# Patient Record
Sex: Female | Born: 1935
Health system: Southern US, Community
[De-identification: ages and names within clinical notes are randomized; demographics above are authoritative.]

## PROBLEM LIST (undated history)

## (undated) DIAGNOSIS — E039 Hypothyroidism, unspecified: Secondary | ICD-10-CM

## (undated) DIAGNOSIS — M069 Rheumatoid arthritis, unspecified: Secondary | ICD-10-CM

## (undated) DIAGNOSIS — M5136 Other intervertebral disc degeneration, lumbar region: Secondary | ICD-10-CM

## (undated) DIAGNOSIS — G629 Polyneuropathy, unspecified: Secondary | ICD-10-CM

## (undated) DIAGNOSIS — H9121 Sudden idiopathic hearing loss, right ear: Secondary | ICD-10-CM

## (undated) DIAGNOSIS — N950 Postmenopausal bleeding: Secondary | ICD-10-CM

## (undated) DIAGNOSIS — H6121 Impacted cerumen, right ear: Secondary | ICD-10-CM

## (undated) DIAGNOSIS — I5032 Chronic diastolic (congestive) heart failure: Secondary | ICD-10-CM

## (undated) DIAGNOSIS — G250 Essential tremor: Secondary | ICD-10-CM

## (undated) DIAGNOSIS — M810 Age-related osteoporosis without current pathological fracture: Secondary | ICD-10-CM

## (undated) DIAGNOSIS — K529 Noninfective gastroenteritis and colitis, unspecified: Secondary | ICD-10-CM

## (undated) DIAGNOSIS — M1990 Unspecified osteoarthritis, unspecified site: Secondary | ICD-10-CM

## (undated) DIAGNOSIS — I110 Hypertensive heart disease with heart failure: Secondary | ICD-10-CM

## (undated) DIAGNOSIS — M4000 Postural kyphosis, site unspecified: Secondary | ICD-10-CM

## (undated) DIAGNOSIS — N951 Menopausal and female climacteric states: Secondary | ICD-10-CM

## (undated) DIAGNOSIS — I639 Cerebral infarction, unspecified: Secondary | ICD-10-CM

## (undated) DIAGNOSIS — E559 Vitamin D deficiency, unspecified: Secondary | ICD-10-CM

## (undated) DIAGNOSIS — I493 Ventricular premature depolarization: Secondary | ICD-10-CM

## (undated) DIAGNOSIS — R609 Edema, unspecified: Secondary | ICD-10-CM

## (undated) DIAGNOSIS — L602 Onychogryphosis: Secondary | ICD-10-CM

## (undated) DIAGNOSIS — H34213 Partial retinal artery occlusion, bilateral: Secondary | ICD-10-CM

## (undated) DIAGNOSIS — Z8673 Personal history of transient ischemic attack (TIA), and cerebral infarction without residual deficits: Secondary | ICD-10-CM

## (undated) DIAGNOSIS — R55 Syncope and collapse: Secondary | ICD-10-CM

## (undated) DIAGNOSIS — I10 Essential (primary) hypertension: Secondary | ICD-10-CM

## (undated) DIAGNOSIS — N813 Complete uterovaginal prolapse: Secondary | ICD-10-CM

## (undated) DIAGNOSIS — I4510 Unspecified right bundle-branch block: Secondary | ICD-10-CM

## (undated) DIAGNOSIS — G40909 Epilepsy, unspecified, not intractable, without status epilepticus: Secondary | ICD-10-CM

## (undated) DIAGNOSIS — I6789 Other cerebrovascular disease: Secondary | ICD-10-CM

## (undated) DIAGNOSIS — I340 Nonrheumatic mitral (valve) insufficiency: Secondary | ICD-10-CM

## (undated) DIAGNOSIS — R269 Unspecified abnormalities of gait and mobility: Secondary | ICD-10-CM

## (undated) DIAGNOSIS — D649 Anemia, unspecified: Secondary | ICD-10-CM

## (undated) DIAGNOSIS — M51369 Other intervertebral disc degeneration, lumbar region without mention of lumbar back pain or lower extremity pain: Secondary | ICD-10-CM

## (undated) HISTORY — DX: Unspecified abnormalities of gait and mobility: R26.9

## (undated) HISTORY — DX: Age-related osteoporosis without current pathological fracture: M81.0

## (undated) HISTORY — DX: Unspecified right bundle-branch block: I45.10

## (undated) HISTORY — DX: Personal history of transient ischemic attack (TIA), and cerebral infarction without residual deficits: Z86.73

## (undated) HISTORY — DX: Other intervertebral disc degeneration, lumbar region without mention of lumbar back pain or lower extremity pain: M51.369

## (undated) HISTORY — DX: Postural kyphosis, site unspecified: M40.00

## (undated) HISTORY — DX: Unspecified osteoarthritis, unspecified site: M19.90

## (undated) HISTORY — DX: Rheumatoid arthritis, unspecified: M06.9

## (undated) HISTORY — DX: Noninfective gastroenteritis and colitis, unspecified: K52.9

## (undated) HISTORY — DX: Complete uterovaginal prolapse: N81.3

## (undated) HISTORY — DX: Essential tremor: G25.0

## (undated) HISTORY — DX: Hypertensive heart disease with heart failure: I11.0

## (undated) HISTORY — DX: Other intervertebral disc degeneration, lumbar region: M51.36

## (undated) HISTORY — DX: Vitamin D deficiency, unspecified: E55.9

## (undated) HISTORY — DX: Hypothyroidism, unspecified: E03.9

## (undated) HISTORY — DX: Chronic diastolic (congestive) heart failure: I50.32

## (undated) HISTORY — DX: Partial retinal artery occlusion, bilateral: H34.213

## (undated) HISTORY — DX: Epilepsy, unspecified, not intractable, without status epilepticus: G40.909

## (undated) HISTORY — DX: Edema, unspecified: R60.9

## (undated) HISTORY — DX: Cerebral infarction, unspecified: I63.9

## (undated) HISTORY — DX: Other cerebrovascular disease: I67.89

## (undated) HISTORY — DX: Syncope and collapse: R55

## (undated) HISTORY — DX: Polyneuropathy, unspecified: G62.9

## (undated) HISTORY — DX: Menopausal and female climacteric states: N95.1

## (undated) HISTORY — DX: Ventricular premature depolarization: I49.3

## (undated) HISTORY — DX: Nonrheumatic mitral (valve) insufficiency: I34.0

## (undated) HISTORY — DX: Postmenopausal bleeding: N95.0

## (undated) HISTORY — DX: Anemia, unspecified: D64.9

## (undated) HISTORY — DX: Sudden idiopathic hearing loss, right ear: H91.21

## (undated) HISTORY — DX: Impacted cerumen, right ear: H61.21

## (undated) HISTORY — DX: Essential (primary) hypertension: I10

## (undated) HISTORY — DX: Onychogryphosis: L60.2

## (undated) HISTORY — PX: BACK SURGERY: SHX140

---

## 1994-08-14 DIAGNOSIS — I639 Cerebral infarction, unspecified: Secondary | ICD-10-CM

## 1994-08-14 HISTORY — DX: Cerebral infarction, unspecified: I63.9

## 1998-06-30 ENCOUNTER — Other Ambulatory Visit: Admission: RE | Admit: 1998-06-30 | Discharge: 1998-06-30 | Payer: Self-pay | Admitting: Family Medicine

## 1999-08-16 ENCOUNTER — Encounter: Admission: RE | Admit: 1999-08-16 | Discharge: 1999-08-16 | Payer: Self-pay | Admitting: Family Medicine

## 1999-08-16 ENCOUNTER — Encounter: Payer: Self-pay | Admitting: Family Medicine

## 2000-04-03 ENCOUNTER — Encounter: Admission: RE | Admit: 2000-04-03 | Discharge: 2000-04-03 | Payer: Self-pay | Admitting: Family Medicine

## 2000-04-03 ENCOUNTER — Encounter: Payer: Self-pay | Admitting: Family Medicine

## 2000-08-14 HISTORY — PX: CHOLECYSTECTOMY, LAPAROSCOPIC: SHX56

## 2000-12-31 ENCOUNTER — Encounter: Payer: Self-pay | Admitting: Family Medicine

## 2000-12-31 ENCOUNTER — Encounter: Admission: RE | Admit: 2000-12-31 | Discharge: 2000-12-31 | Payer: Self-pay | Admitting: Family Medicine

## 2001-01-07 ENCOUNTER — Encounter: Payer: Self-pay | Admitting: Family Medicine

## 2001-01-07 ENCOUNTER — Encounter: Admission: RE | Admit: 2001-01-07 | Discharge: 2001-01-07 | Payer: Self-pay | Admitting: Family Medicine

## 2001-07-29 ENCOUNTER — Encounter: Admission: RE | Admit: 2001-07-29 | Discharge: 2001-07-29 | Payer: Self-pay | Admitting: Family Medicine

## 2001-07-29 ENCOUNTER — Encounter: Payer: Self-pay | Admitting: Family Medicine

## 2002-01-14 ENCOUNTER — Encounter: Payer: Self-pay | Admitting: Family Medicine

## 2002-01-14 ENCOUNTER — Encounter: Admission: RE | Admit: 2002-01-14 | Discharge: 2002-01-14 | Payer: Self-pay | Admitting: Family Medicine

## 2002-06-23 ENCOUNTER — Encounter: Payer: Self-pay | Admitting: Gastroenterology

## 2002-06-23 ENCOUNTER — Encounter: Admission: RE | Admit: 2002-06-23 | Discharge: 2002-06-23 | Payer: Self-pay | Admitting: Gastroenterology

## 2002-09-14 ENCOUNTER — Inpatient Hospital Stay (HOSPITAL_COMMUNITY): Admission: EM | Admit: 2002-09-14 | Discharge: 2002-09-15 | Payer: Self-pay | Admitting: Emergency Medicine

## 2002-12-15 ENCOUNTER — Encounter: Admission: RE | Admit: 2002-12-15 | Discharge: 2002-12-15 | Payer: Self-pay | Admitting: Gastroenterology

## 2002-12-15 ENCOUNTER — Encounter: Payer: Self-pay | Admitting: Gastroenterology

## 2003-03-13 ENCOUNTER — Encounter: Payer: Self-pay | Admitting: Family Medicine

## 2003-03-13 ENCOUNTER — Ambulatory Visit (HOSPITAL_COMMUNITY): Admission: RE | Admit: 2003-03-13 | Discharge: 2003-03-13 | Payer: Self-pay | Admitting: Family Medicine

## 2003-04-06 ENCOUNTER — Ambulatory Visit (HOSPITAL_COMMUNITY): Admission: RE | Admit: 2003-04-06 | Discharge: 2003-04-06 | Payer: Self-pay | Admitting: Gastroenterology

## 2004-03-14 ENCOUNTER — Ambulatory Visit (HOSPITAL_COMMUNITY): Admission: RE | Admit: 2004-03-14 | Discharge: 2004-03-14 | Payer: Self-pay | Admitting: Family Medicine

## 2004-10-19 ENCOUNTER — Encounter: Admission: RE | Admit: 2004-10-19 | Discharge: 2004-10-19 | Payer: Self-pay | Admitting: Family Medicine

## 2004-12-15 ENCOUNTER — Encounter: Admission: RE | Admit: 2004-12-15 | Discharge: 2004-12-15 | Payer: Self-pay | Admitting: Internal Medicine

## 2005-04-27 ENCOUNTER — Ambulatory Visit (HOSPITAL_COMMUNITY): Admission: RE | Admit: 2005-04-27 | Discharge: 2005-04-27 | Payer: Self-pay | Admitting: Family Medicine

## 2006-01-26 ENCOUNTER — Encounter: Admission: RE | Admit: 2006-01-26 | Discharge: 2006-01-26 | Payer: Self-pay | Admitting: Family Medicine

## 2006-02-04 ENCOUNTER — Encounter: Admission: RE | Admit: 2006-02-04 | Discharge: 2006-02-04 | Payer: Self-pay | Admitting: Family Medicine

## 2006-04-27 ENCOUNTER — Encounter: Payer: Self-pay | Admitting: Cardiology

## 2006-06-06 ENCOUNTER — Ambulatory Visit (HOSPITAL_COMMUNITY): Admission: RE | Admit: 2006-06-06 | Discharge: 2006-06-06 | Payer: Self-pay | Admitting: Family Medicine

## 2007-06-04 ENCOUNTER — Other Ambulatory Visit: Admission: RE | Admit: 2007-06-04 | Discharge: 2007-06-04 | Payer: Self-pay | Admitting: Obstetrics & Gynecology

## 2007-07-09 ENCOUNTER — Ambulatory Visit (HOSPITAL_COMMUNITY): Admission: RE | Admit: 2007-07-09 | Discharge: 2007-07-09 | Payer: Self-pay | Admitting: Family Medicine

## 2007-07-31 ENCOUNTER — Ambulatory Visit (HOSPITAL_COMMUNITY): Admission: RE | Admit: 2007-07-31 | Discharge: 2007-07-31 | Payer: Self-pay | Admitting: Family Medicine

## 2008-01-13 HISTORY — PX: COLONOSCOPY W/ BIOPSIES: SHX1374

## 2008-02-16 ENCOUNTER — Emergency Department (HOSPITAL_COMMUNITY): Admission: EM | Admit: 2008-02-16 | Discharge: 2008-02-16 | Payer: Self-pay | Admitting: Emergency Medicine

## 2008-02-19 ENCOUNTER — Emergency Department (HOSPITAL_COMMUNITY): Admission: EM | Admit: 2008-02-19 | Discharge: 2008-02-19 | Payer: Self-pay | Admitting: Emergency Medicine

## 2008-08-10 ENCOUNTER — Encounter: Admission: RE | Admit: 2008-08-10 | Discharge: 2008-08-10 | Payer: Self-pay | Admitting: Family Medicine

## 2008-09-23 ENCOUNTER — Encounter: Admission: RE | Admit: 2008-09-23 | Discharge: 2008-09-23 | Payer: Self-pay | Admitting: Family Medicine

## 2008-10-08 ENCOUNTER — Emergency Department (HOSPITAL_BASED_OUTPATIENT_CLINIC_OR_DEPARTMENT_OTHER): Admission: EM | Admit: 2008-10-08 | Discharge: 2008-10-08 | Payer: Self-pay | Admitting: Emergency Medicine

## 2008-10-20 ENCOUNTER — Encounter: Payer: Self-pay | Admitting: *Deleted

## 2008-12-10 ENCOUNTER — Encounter: Admission: RE | Admit: 2008-12-10 | Discharge: 2008-12-10 | Payer: Self-pay | Admitting: Orthopedic Surgery

## 2009-01-20 ENCOUNTER — Inpatient Hospital Stay (HOSPITAL_COMMUNITY): Admission: RE | Admit: 2009-01-20 | Discharge: 2009-01-22 | Payer: Self-pay | Admitting: Orthopedic Surgery

## 2009-01-23 HISTORY — PX: LAMINECTOMY AND MICRODISCECTOMY LUMBAR SPINE: SHX1913

## 2009-10-26 ENCOUNTER — Ambulatory Visit (HOSPITAL_COMMUNITY): Admission: RE | Admit: 2009-10-26 | Discharge: 2009-10-26 | Payer: Self-pay | Admitting: Family Medicine

## 2010-07-06 ENCOUNTER — Encounter: Admission: RE | Admit: 2010-07-06 | Discharge: 2010-07-06 | Payer: Self-pay | Admitting: Family Medicine

## 2010-09-18 ENCOUNTER — Emergency Department (HOSPITAL_BASED_OUTPATIENT_CLINIC_OR_DEPARTMENT_OTHER)
Admission: EM | Admit: 2010-09-18 | Discharge: 2010-09-18 | Disposition: A | Discharge status: Home / Self Care | Payer: Medicare Other | Attending: Emergency Medicine | Admitting: Emergency Medicine

## 2010-09-18 DIAGNOSIS — E039 Hypothyroidism, unspecified: Secondary | ICD-10-CM | POA: Insufficient documentation

## 2010-09-18 DIAGNOSIS — Z8739 Personal history of other diseases of the musculoskeletal system and connective tissue: Secondary | ICD-10-CM | POA: Insufficient documentation

## 2010-09-18 DIAGNOSIS — I10 Essential (primary) hypertension: Secondary | ICD-10-CM | POA: Insufficient documentation

## 2010-09-18 DIAGNOSIS — Z79899 Other long term (current) drug therapy: Secondary | ICD-10-CM | POA: Insufficient documentation

## 2010-09-18 DIAGNOSIS — I2510 Atherosclerotic heart disease of native coronary artery without angina pectoris: Secondary | ICD-10-CM | POA: Insufficient documentation

## 2010-11-21 LAB — URINALYSIS, ROUTINE W REFLEX MICROSCOPIC
Glucose, UA: NEGATIVE mg/dL
Ketones, ur: NEGATIVE mg/dL
Specific Gravity, Urine: 1.013 (ref 1.005–1.030)

## 2010-11-21 LAB — COMPREHENSIVE METABOLIC PANEL
ALT: 12 U/L (ref 0–35)
AST: 16 U/L (ref 0–37)
Alkaline Phosphatase: 53 U/L (ref 39–117)
BUN: 12 mg/dL (ref 6–23)
CO2: 30 mEq/L (ref 19–32)
Creatinine, Ser: 0.95 mg/dL (ref 0.4–1.2)
GFR calc Af Amer: >60 mL/min (ref >60)
Potassium: 3.7 mEq/L (ref 3.5–5.1)
Sodium: 146 mEq/L — ABNORMAL HIGH (ref 135–145)
Total Protein: 6.6 g/dL (ref 6.0–8.3)

## 2010-11-21 LAB — BASIC METABOLIC PANEL
BUN: 8 mg/dL (ref 6–23)
CO2: 30 mEq/L (ref 19–32)
Calcium: 8.3 mg/dL — ABNORMAL LOW (ref 8.4–10.5)
Chloride: 100 mEq/L (ref 96–112)
Creatinine, Ser: 1.07 mg/dL (ref 0.4–1.2)
GFR calc Af Amer: 60 mL/min — ABNORMAL LOW (ref >60)
Glucose, Bld: 108 mg/dL — ABNORMAL HIGH (ref 70–99)

## 2010-11-21 LAB — CBC
Platelets: 339 10*3/uL (ref 150–400)
RBC: 3.73 MIL/uL — ABNORMAL LOW (ref 3.87–5.11)
RDW: 15.5 % (ref 11.5–15.5)

## 2010-11-21 LAB — PROTIME-INR
INR: 0.9 (ref 0.00–1.49)
Prothrombin Time: 12.7 seconds (ref 11.6–15.2)

## 2010-11-21 LAB — URINE MICROSCOPIC-ADD ON

## 2010-11-21 LAB — DIFFERENTIAL
Basophils Absolute: 0 10*3/uL (ref 0.0–0.1)
Eosinophils Absolute: 0.3 10*3/uL (ref 0.0–0.7)
Monocytes Relative: 9 % (ref 3–12)

## 2010-11-25 ENCOUNTER — Other Ambulatory Visit (HOSPITAL_COMMUNITY): Payer: Self-pay | Admitting: Family Medicine

## 2010-11-25 DIAGNOSIS — Z1231 Encounter for screening mammogram for malignant neoplasm of breast: Secondary | ICD-10-CM

## 2010-11-29 LAB — COMPREHENSIVE METABOLIC PANEL WITH GFR
ALT: 30 U/L (ref 0–35)
AST: 41 U/L — ABNORMAL HIGH (ref 0–37)
Albumin: 4.2 g/dL (ref 3.5–5.2)
Alkaline Phosphatase: 65 U/L (ref 39–117)
BUN: 18 mg/dL (ref 6–23)
CO2: 27 meq/L (ref 19–32)
Calcium: 9.7 mg/dL (ref 8.4–10.5)
Chloride: 106 meq/L (ref 96–112)
Creatinine, Ser: 0.9 mg/dL (ref 0.4–1.2)
GFR calc Af Amer: >60 mL/min (ref >60)
GFR calc non Af Amer: >60 mL/min (ref >60)
Glucose, Bld: 132 mg/dL — ABNORMAL HIGH (ref 70–99)
Potassium: 4.1 meq/L (ref 3.5–5.1)
Sodium: 142 meq/L (ref 135–145)
Total Bilirubin: 1.1 mg/dL (ref 0.3–1.2)
Total Protein: 7.2 g/dL (ref 6.0–8.3)

## 2010-11-29 LAB — URINALYSIS, ROUTINE W REFLEX MICROSCOPIC
Bilirubin Urine: NEGATIVE
Glucose, UA: NEGATIVE mg/dL
Hgb urine dipstick: NEGATIVE
Ketones, ur: NEGATIVE mg/dL
Nitrite: NEGATIVE
Protein, ur: NEGATIVE mg/dL
Specific Gravity, Urine: 1.008 (ref 1.005–1.030)
Urobilinogen, UA: 0.2 mg/dL (ref 0.0–1.0)
pH: 6.5 (ref 5.0–8.0)

## 2010-11-29 LAB — DIFFERENTIAL
Basophils Absolute: 0.2 K/uL — ABNORMAL HIGH (ref 0.0–0.1)
Basophils Relative: 1 % (ref 0–1)
Eosinophils Absolute: 0.1 K/uL (ref 0.0–0.7)
Eosinophils Relative: 1 % (ref 0–5)
Lymphocytes Relative: 7 % — ABNORMAL LOW (ref 12–46)
Lymphs Abs: 1 K/uL (ref 0.7–4.0)
Monocytes Absolute: 0.4 K/uL (ref 0.1–1.0)
Monocytes Relative: 3 % (ref 3–12)
Neutro Abs: 12.8 K/uL — ABNORMAL HIGH (ref 1.7–7.7)
Neutrophils Relative %: 89 % — ABNORMAL HIGH (ref 43–77)

## 2010-11-29 LAB — CBC
HCT: 37.6 % (ref 36.0–46.0)
Hemoglobin: 12.8 g/dL (ref 12.0–15.0)
MCHC: 34 g/dL (ref 30.0–36.0)
MCV: 100.4 fL — ABNORMAL HIGH (ref 78.0–100.0)
RDW: 13.1 % (ref 11.5–15.5)

## 2010-11-29 LAB — URINE MICROSCOPIC-ADD ON

## 2010-12-02 ENCOUNTER — Ambulatory Visit (HOSPITAL_COMMUNITY): Payer: Medicare Other

## 2010-12-15 ENCOUNTER — Ambulatory Visit (HOSPITAL_COMMUNITY)
Admission: RE | Admit: 2010-12-15 | Discharge: 2010-12-15 | Disposition: A | Discharge status: Home / Self Care | Payer: Medicare Other | Source: Ambulatory Visit | Attending: Family Medicine | Admitting: Family Medicine

## 2010-12-15 DIAGNOSIS — Z1231 Encounter for screening mammogram for malignant neoplasm of breast: Secondary | ICD-10-CM | POA: Insufficient documentation

## 2010-12-27 NOTE — Op Note (Signed)
NAME:  Anna Mccoy, Anna Mccoy            ACCOUNT NO.:  [id number]   MEDICAL RECORD NO.:  [id number]          PATIENT TYPE:  INP   LOCATION:  1534                         FACILITY:  [facility]   PHYSICIAN:  Georges Lynch. Gioffre, M.D.DATE OF BIRTH:  November 12, 1924   DATE OF PROCEDURE:  01/20/2009  DATE OF DISCHARGE:                               OPERATIVE REPORT   ASSISTANT:  Dr. Marlowe Kays MD.   PREOP DIAGNOSES:  1. Complete block at L4-5 for spinal stenosis.  2. Partial block at L3-4  spinal stenosis.   PROCEDURE:  Under general anesthesia routine orthopedic prep and draping  of the lower back carried out.  She had 1 gram of IV Ancef.  The patient  was placed on spinal frame with the prep.  At this time two needles were  placed in the back for localization purposes and an x-ray was taken.   Following that an incision was made over L3-4, L4-5 in the usual  fashion.  Bleeders identified and cauterized.  The muscle was stripped  from the lamina and spinous processes of L3-4, L4-5.  Another x-ray was  taken for verification.  Following that,  the self-retaining McCullough  retractors were inserted.  We began our central decompressive lumbar  laminectomies.  I removed the spinous processes of L3 and of L4.  I then  went down and removed the lamina of both with the help of the  microscope.  We then exposed the ligamentum flavum and gently dissected  the flavum away from the dura.  It was quite adherent down at the L4-5  space but  we were able to nicely free that up and did nice  foraminotomies to decompress the dura.  We were able to pass a hockey-  stick distal to the distal decompression site and it was wide open.  Note, she did have a distal scoliosis and you could see, literally see,  the dura curve.  Following that,  we went out and decompressed the  lateral recesses and then went up to L3-4,  decompressed 3-4 as well.   When that decompression was completed we were able to easily pass the  hockey sticks out the foramina.  We opened the foramina bilaterally at  both levels.  We were able then easily to pass a hockey-stick proximally  and make sure we were wide open proximally which we were,  as well  distally so no further decompression was indicated.   We  thoroughly irrigated out the area, removed the fluid.  We then  injected 10 mL of FloSeal over the dura and over the lateral recess  region and then utilized some thrombin-soaked Gelfoam out laterally well  away from the dura for hemostasis purposes.  The wound then was closed  in layers in the usual fashion.  I did leave a small deep and small deep  proximal and distal ports with the wound open for drainage purposes.  Subcu was closed with 0 Vicryl,  the skin with metal staples.  Sterile  Neosporin dressing applied.  The patient left the operating room in  satisfactory condition.  ______________________________  Georges Lynch Darrelyn Hillock, M.D.     RAG/MEDQ  D:  01/20/2009  T:  01/21/2009  Job:  [id number]

## 2010-12-27 NOTE — Discharge Summary (Signed)
NAME:  Anna Mccoy, Anna Mccoy            ACCOUNT NO.:  [id number]   MEDICAL RECORD NO.:  [id number]          PATIENT TYPE:  INP   LOCATION:  1534                         FACILITY:  [facility]   PHYSICIAN:  Georges Lynch. Gioffre, M.D.DATE OF BIRTH:  [birth year]   DATE OF ADMISSION:  01/20/2009  DATE OF DISCHARGE:  01/22/2009                               DISCHARGE SUMMARY   HISTORY:  The patient was taken to surgery on January 20, 2009.  I did a  decompressive lumbar laminectomy at L3-L4, L4-L5 for a complete block.  She had severe spinal stenosis.  Postop, she was allowed up to ambulate  with a walker.  On January 21, 2009, she was doing well.  Her sodium was a  little elevated at 146.  We just simply changed her IV to 5% dextrose  and water to keep open.  Her sodium came down to 134.  She did well  postop.  On January 22, 2009, I changed her dressing and the wound looked  good.  She remained afebrile.  She was seen and cleared for surgery  preop.   LABORATORY DATA:  The pertinent laboratory findings on admission:  White  count 8300, hemoglobin 12.1, hematocrit 37.4, platelet count 339,000.  Differential was normal.  Sodium 146, but repeated on January 22, 2009 it  was 134.  Potassium 3.7, glucose 84, BUN 12, creatinine 0.95, SGOT 16,  SGPT 12, alkaline phos 53, total bilirubin 0.7, INR was 0.9, PT was  12.7, PTT was 30.  Urinalysis was within normal limits.   DIAGNOSTICS:  EKG basically showed sinus rhythm.   FINAL DISCHARGE DIAGNOSES:  1. Severe spinal stenosis at L3-L4.  2. Severe spinal stenosis at L4-L5 with a complete block.   CONDITION ON DISCHARGE:  Improved.   DISCHARGE DIET:  She will remain on the same diet postop that she had at  home.   DISCHARGE MEDICATIONS:  1. Methotrexate 2.5 mg as taken at home.  2. Premarin 0.625 mg vaginal cream as taken at home.  3. Lisinopril 20 mg a day.  4. Hydrochlorothiazide 12.5 mg a day.  5. Levothyroxine 75 mcg daily.  6. Folic acid 1 mg a day.  7.  Prednisone 5 mg a day.  8. Norvasc 5 mg a day.  9. Vitamin D 50,000 units 3 times a week.  10.Percocet 10/650 one every 4 hours p.r.n. for pain.  11.Robaxin 500 mg t.i.d. p.r.n. for spasms.  12.MiraLax 17 gm powder to take as needed.   DISCHARGE INSTRUCTIONS:  1. See Dr. Darrelyn Hillock 2 weeks from the day of surgery for suture removal.  2. Ambulate with a walker for weightbearing.  3. We will have [place] follow her at home.           ______________________________  Georges Lynch. Darrelyn Hillock, M.D.     RAG/MEDQ  D:  01/22/2009  T:  01/22/2009  Job:  [id number]   cc:   Windy Fast A. Darrelyn Hillock, M.D.  Fax: [phone]

## 2010-12-30 NOTE — Op Note (Signed)
NAME:  Anna Mccoy, Anna Mccoy                      ACCOUNT NO.:  [id number]   MEDICAL RECORD NO.:  [id number]                   PATIENT TYPE:  AMB   LOCATION:  ENDO                                 FACILITY:  [facility]   PHYSICIAN:  Anselmo Rod, M.D.               DATE OF BIRTH:  Jul 15, 1925   DATE OF PROCEDURE:  04/06/2003  DATE OF DISCHARGE:                                 OPERATIVE REPORT   PROCEDURE PERFORMED:  Screening colonoscopy.   ENDOSCOPIST:  Charna Elizabeth, M.D.   INSTRUMENT USED:  Olympus video colonoscope.   INDICATIONS FOR PROCEDURE:  The patient is a 77 year old female with a  history of colon cancer in her father, undergoing screening colonoscopy.  Rule out colonic polyps, masses, etc.   PREPROCEDURE PREPARATION:  Informed consent was procured from the patient.  The patient was fasted for eight hours prior to the procedure and prepped  with a bottle of magnesium citrate and a gallon of GoLYTELY the night prior  to the procedure.   PREPROCEDURE PHYSICAL:  The patient had stable vital signs.  Neck supple.  Chest clear to auscultation.  S1 and S2 regular.  Abdomen soft with normal  bowel sounds.   DESCRIPTION OF PROCEDURE:  The patient was placed in left lateral decubitus  position and sedated with 50 mg of Demerol and 6 mg of Versed intravenously.  Once the patient was adequately sedated and maintained on low flow oxygen  and continuous cardiac monitoring, the Olympus video colonoscope was  advanced from the rectum to the cecum with slight difficulty.  The patient  had a very tortuous colon.  The patient's position was changed from the left  lateral to the supine and the right lateral side to reach the cecal base.  Gentle abdominal pressure was applied.  There were few scattered diverticula  throughout the colon.  These were small in the early stages of formation.  Small internal hemorrhoids were seen on retroflexion in the rectum.  No  masses or polyps were seen.  The  appendicular orifice and ileocecal valve  were clearly visualized and photographed.   IMPRESSION:  1. Small nonbleeding internal hemorrhoids.  2. Scattered early diverticula throughout the colon.  3. No masses or polyps seen.   RECOMMENDATIONS:  1. Continue high fiber diet with liberal fluid intake, 20 to 25g of fiber in     the diet has been recommended.  2. Repeat colorectal cancer screening is recommended in the next five years     unless the patient develops any abnormal symptoms in the interim.  3. Outpatient follow-up on a p.r.n. basis.                                                    Anselmo Rod, M.D.  JNM/MEDQ  D:  04/06/2003  T:  04/06/2003  Job:  [id number]   cc:   Tammy R. Collins [place], M.D.  [address]  [place]  [place] [address]  Fax: [phone]

## 2011-02-12 DIAGNOSIS — N950 Postmenopausal bleeding: Secondary | ICD-10-CM

## 2011-02-12 HISTORY — DX: Postmenopausal bleeding: N95.0

## 2011-05-11 LAB — CBC
MCHC: 33.4
MCV: 98.6
Platelets: 275

## 2011-05-11 LAB — DIFFERENTIAL
Basophils Relative: 0
Eosinophils Absolute: 0.2
Neutrophils Relative %: 64

## 2011-05-11 LAB — POCT I-STAT, CHEM 8
HCT: 42
Hemoglobin: 14.3
Potassium: 4.2
Sodium: 141

## 2011-11-10 ENCOUNTER — Encounter: Payer: Self-pay | Admitting: *Deleted

## 2012-12-02 ENCOUNTER — Encounter: Payer: Self-pay | Admitting: *Deleted

## 2012-12-02 ENCOUNTER — Telehealth: Payer: Self-pay | Admitting: Nurse Practitioner

## 2012-12-02 NOTE — Telephone Encounter (Signed)
Patient having spotting for no reason and was concerned . Request appt. To come tomorrow for Shirlyn Goltz, FNP, @ 2:30pm

## 2012-12-02 NOTE — Telephone Encounter (Signed)
Patient reporting spotting and wants an appointment/Anna Mccoy

## 2012-12-03 ENCOUNTER — Encounter: Payer: Self-pay | Admitting: Nurse Practitioner

## 2012-12-03 ENCOUNTER — Ambulatory Visit (INDEPENDENT_AMBULATORY_CARE_PROVIDER_SITE_OTHER): Payer: Medicare Other | Admitting: Nurse Practitioner

## 2012-12-03 VITALS — BP 150/74 | HR 82 | Resp 12 | Wt 128.6 lb

## 2012-12-03 DIAGNOSIS — N760 Acute vaginitis: Secondary | ICD-10-CM

## 2012-12-03 MED ORDER — METRONIDAZOLE 0.75 % VA GEL: 1.0000 | Freq: Every day | VAGINAL | Status: DC

## 2012-12-03 NOTE — Progress Notes (Signed)
Encounter reviewed by Dr. Darrelyn Morro Silva.  

## 2012-12-03 NOTE — Patient Instructions (Signed)
Recheck in 2.5 months 

## 2012-12-03 NOTE — Progress Notes (Signed)
Subjective:     Patient ID: Anna Mccoy, female   DOB: 1925-08-04, 79 y.o.   MRN: [id number]  HPI Comments: Patient complains of being very active on Saturday and Sunday with multiple family members visiting for the Easter Holiday.  Then Sunday morning had bright red vaginal bleed that was dime size.  Then on Monday brown tint.  Now nothing. Last dose of vaginal cream on Friday. She has not felt that the pessary was uncomfortable but may have hurt herself with application of the cream.  Maybe a slight odor noted.    Review of Systems  Constitutional: Negative.  Negative for fever and chills.       Some fatigue due to the number of family members visiting her.  Respiratory: Negative.   Cardiovascular: Negative.   Gastrointestinal: Negative.   Genitourinary: Positive for vaginal bleeding. Negative for dysuria, urgency, frequency, hematuria, flank pain, vaginal discharge, enuresis, difficulty urinating and pelvic pain.  Musculoskeletal: Positive for back pain, arthralgias and gait problem.       Some increase in back pain with recent activity.  Neurological: Negative.   Psychiatric/Behavioral: Negative.        Objective:   Physical Exam  Constitutional: She appears well-developed and well-nourished.  Cardiovascular: Normal rate.   Pulmonary/Chest: Effort normal.  Abdominal: Soft. She exhibits no distension and no mass. There is no tenderness. There is no rebound and no guarding.  Genitourinary:  2 3/4" pessary is removed with light brown tinged vaginal discharge. On speculum exam there is one area right side of vaginal wall about 2 mm size red spot.  No true erosion or excoriation. Vaginal vault was cleaned and no other cervical lesion or bleeding noted. Pessary was cleaned and reinserted with use of vag E cream.       Assessment:     Vaginal wall irritation, maybe from insertion of vag E cream used for pessary. She is prone to BV with irritation.    Plan:     Will go ahead  a have patient to use Metrogel vaginal cream at hs for at least 3 nights to hopefully avoid any BV.

## 2012-12-17 ENCOUNTER — Ambulatory Visit: Payer: Self-pay | Admitting: Nurse Practitioner

## 2013-02-27 ENCOUNTER — Encounter: Payer: Self-pay | Admitting: *Deleted

## 2013-03-04 ENCOUNTER — Ambulatory Visit (INDEPENDENT_AMBULATORY_CARE_PROVIDER_SITE_OTHER): Payer: Medicare Other | Admitting: Nurse Practitioner

## 2013-03-04 ENCOUNTER — Encounter: Payer: Self-pay | Admitting: Nurse Practitioner

## 2013-03-04 VITALS — BP 142/60 | HR 72 | Resp 12 | Ht 59.0 in | Wt 124.2 lb

## 2013-03-04 DIAGNOSIS — N813 Complete uterovaginal prolapse: Secondary | ICD-10-CM

## 2013-03-04 DIAGNOSIS — N950 Postmenopausal bleeding: Secondary | ICD-10-CM

## 2013-03-04 MED ORDER — ESTROGENS, CONJUGATED 0.625 MG/GM VA CREA: TOPICAL_CREAM | VAGINAL | Status: DC

## 2013-03-04 NOTE — Progress Notes (Signed)
79 y.o. Widowed Black female G5P5000 here for pessary check.  Patient has been using following pessary style and size:  Ring pessary with support size 2 3/4" .  She is not sexually active.  She describes the following issues with the pessary:  none.  ROS: she complains of bright red vaginal bleeding or streaking X 2 on her pad.  The last episode last Thursday with small clot., no dysuria, trouble voiding or hematuria  Exam:   BP 142/60  Pulse 72  Resp 12  Ht 4\' 11"  (1.499 m)  Wt 124 lb 3.2 oz (56.337 kg)  BMI 25.07 kg/m2 General appearance: alert, cooperative, appears stated age and no distress Inguinal adenopathy: negative   Pelvic: External genitalia:  no lesions              Urethra: normal appearing urethra with no masses, tenderness or lesions              Bartholin's and Skene's: normal                 Vagina: atrophic, vaginal erythema bottom of cervix secondary to pessary.  Bright red blood from that area but none from cervix on swab., vaginal discharge - light yellow creamy without blood.               Cervix: ectropian, without bleeding Bimanual Exam:  Uterus:  uterus is normal size, shape, consistency and non tender                               Adnexa:    not indicated and normal adnexa in size, non tender and no masses                               Anus:  defer exam  Pessary was removed without difficulty.  Pessary was cleansed.  Pessary was replaced. Patient tolerated procedure well.    A:  PMP bleeding possibly - but has history of endo biopsy 02/28/11 with glandular  and stromal breakdown - at that time declined PUS to check for polyp as was  suggested  SUI, Urge incontinence,   Cystocele- asymptomatic with use of pessary         P:   Return to office in next 7- 10 days for repeat endo biopsy and probable PUS.  She will continue with Premarin vag cream as directed until we can make sure this is not from pessary use.         An After Visit Summary was printed and  given to the patient.

## 2013-03-04 NOTE — Patient Instructions (Signed)
We will be calling you to schedule endometrial biopsy within a week.

## 2013-03-06 NOTE — Progress Notes (Signed)
Encounter reviewed.  I recommend an endometrial biopsy and a pelvic ultrasound.

## 2013-03-07 ENCOUNTER — Other Ambulatory Visit: Payer: Self-pay | Admitting: Obstetrics and Gynecology

## 2013-03-07 DIAGNOSIS — N950 Postmenopausal bleeding: Secondary | ICD-10-CM

## 2013-03-12 ENCOUNTER — Encounter: Payer: Self-pay | Admitting: Obstetrics and Gynecology

## 2013-03-12 ENCOUNTER — Ambulatory Visit (INDEPENDENT_AMBULATORY_CARE_PROVIDER_SITE_OTHER): Payer: Medicare Other

## 2013-03-12 ENCOUNTER — Ambulatory Visit (INDEPENDENT_AMBULATORY_CARE_PROVIDER_SITE_OTHER): Payer: Medicare Other | Admitting: Obstetrics and Gynecology

## 2013-03-12 VITALS — BP 142/80 | HR 70 | Ht 59.0 in | Wt 124.0 lb

## 2013-03-12 DIAGNOSIS — N950 Postmenopausal bleeding: Secondary | ICD-10-CM

## 2013-03-12 NOTE — Progress Notes (Signed)
  Subjective  Vaginal bleeding twice this month.  Brown bleeding and red streaking.  Has a pessary.  Using vagina estrogen cream three times a week.    Doing physical therapy recently, twice a week for the last three months - bicycling.  Doing left hip and back strengthening.    Status she had an endometrial biopsy in 2012 - benign.  Objective  See ultrasound below - Endometrial stripe 5.7 mm, 13 mm calcified fibroid, normal ovaries.     Procedure - endometrial biopsy  Verbal and written consent.  Ring pessary with support removed and cleansed.  Speculum placed in the vagina.  Blood noted from cervix surface.  Sterile prep of prolapse cervix with Hibiclens.  Tenaculum to anterior cervical lip.  Pipelle passed twice to 6 cm.  Minimal tissue noted and sent to pathology.  No complications.  Uterus small and nontender.  No adnexal masses or tenderness. Pessary replaced.  No complications.   Assessment  Postmenopausal bleeding. Slightly thickened endometrium at 5.7 mm. Uterovaginal prolapse.  Pessary wearer. Usage of vaginal estrogen cream three times a week.  Plan  Follow up of endometrial biopsy. If biopsy is negative, patient may need to come in more often for her pessary check with placement of the cream on the pessary. May consider Estring also in combination with the pessary.

## 2013-03-12 NOTE — Patient Instructions (Signed)
We will contact you with your biopsy results.

## 2013-03-17 LAB — IPS CERVICAL/ECC/EMB/VULVAR/VAGINAL BIOPSY

## 2013-03-24 ENCOUNTER — Telehealth: Payer: Self-pay

## 2013-03-24 NOTE — Telephone Encounter (Signed)
Message copied by Alphonsa Overall on Mon Mar 24, 2013 10:27 AM ------      Message from: Conley Simmonds      Created: Mon Mar 17, 2013  5:35 PM       Please report normal results to the patient. ------

## 2013-03-24 NOTE — Telephone Encounter (Signed)
LMOVM to call for test results. 

## 2013-04-07 ENCOUNTER — Telehealth: Payer: Self-pay | Admitting: Nurse Practitioner

## 2013-04-07 ENCOUNTER — Telehealth: Payer: Self-pay | Admitting: Obstetrics and Gynecology

## 2013-04-07 MED ORDER — ESTROGENS, CONJUGATED 0.625 MG/GM VA CREA: TOPICAL_CREAM | VAGINAL | Status: DC

## 2013-04-07 NOTE — Telephone Encounter (Signed)
OK to refill Premarin vaginal cream to use 1 gm 2 times weekly for a year.

## 2013-04-07 NOTE — Telephone Encounter (Signed)
Spoke with patient appt scheduled for pessary recheck  had already been notified of Biopsy results(neg) cm

## 2013-04-07 NOTE — Telephone Encounter (Signed)
Please advise a sample of premarin cream was given to patient 03/04/13.

## 2013-04-07 NOTE — Telephone Encounter (Signed)
Premarin vaginal cream . Needs refills.

## 2013-04-07 NOTE — Telephone Encounter (Signed)
Premarin Vaginal Cream 1 gm #42.5 gram/ 1 year refills sent through to [facility], Pt is aware.

## 2013-04-07 NOTE — Telephone Encounter (Signed)
Patient wants to know when her next visit is for her follow up . But , she doesn't have one . Not sure what to tell her. She had a Endo Bx consult.

## 2013-04-18 ENCOUNTER — Ambulatory Visit (INDEPENDENT_AMBULATORY_CARE_PROVIDER_SITE_OTHER): Payer: Medicare Other | Admitting: Nurse Practitioner

## 2013-04-18 ENCOUNTER — Encounter: Payer: Self-pay | Admitting: Nurse Practitioner

## 2013-04-18 VITALS — BP 132/70 | HR 68 | Ht 59.0 in | Wt 127.0 lb

## 2013-04-18 DIAGNOSIS — N813 Complete uterovaginal prolapse: Secondary | ICD-10-CM

## 2013-04-18 NOTE — Patient Instructions (Addendum)
Return before the colonoscopy for removal of the pessary and plan to reinsert af few days afterward.

## 2013-04-18 NOTE — Progress Notes (Signed)
79 y.o. Widowed Black female G5P5000 here for pessary check.  Patient has been using following pessary style and size:  2 3/4 " ring pessary with support.  She is not sexually active.  She describes the following issues with the pessary:  None and denies any vaginal bleeding or discharge. She went with her daughter to the [facility] in [place] and she was thrilled with all she saw of [place]!  ROS: no breast pain or new or enlarging lumps on self exam, no side effects of hormonal medications, no vaginal bleeding, no discharge or pelvic pain, no dysuria, trouble voiding or hematuria. She is having more difficulty with her scoliosis and will be doing some PT to help. She is scheduled to have a colonoscopy mid October and will come in a few days prior to remove the pessary for the colonoscopy and then return afterwards to replace.  Exam:   BP 132/70  Pulse 68  Ht 4\' 11"  (1.499 m)  Wt 127 lb (57.607 kg)  BMI 25.64 kg/m2 General appearance: alert, cooperative, appears stated age and always pleasant and loving. Inguinal adenopathy: negative   Pelvic: External genitalia:  no lesions and well estrogenic              Urethra: normal appearing urethra with no masses, tenderness or lesions              Bartholin's and Skene's: normal                 Vagina: normal appearing vagina with normal color and discharge, no lesions or excoriations              Cervix: normal appearance Bimanual Exam:  Uterus:  uterus is normal size, shape, consistency and non tender                               Adnexa:    not indicated                               Anus:  defer exam  Pessary was removed without difficulty.  Pessary was cleansed.  Pessary was replaced. Patient tolerated procedure well.    A: Cystocele- symptomatic, Rectocele- symptomatic      With use of Ring Pessary.  P:  Return to office in about 1 months for removal of pessary for her procedure and then return a few days later to have it replaced.       An After Visit Summary was printed and given to the patient.

## 2013-04-20 NOTE — Progress Notes (Signed)
Encounter reviewed by Dr. Brook Silva.  

## 2013-05-26 ENCOUNTER — Ambulatory Visit: Payer: PRIVATE HEALTH INSURANCE | Admitting: Nurse Practitioner

## 2013-06-04 ENCOUNTER — Encounter: Payer: Self-pay | Admitting: Certified Nurse Midwife

## 2013-06-04 ENCOUNTER — Ambulatory Visit (INDEPENDENT_AMBULATORY_CARE_PROVIDER_SITE_OTHER): Payer: Medicare Other | Admitting: Certified Nurse Midwife

## 2013-06-04 VITALS — BP 116/60 | HR 60 | Resp 16 | Ht 59.0 in

## 2013-06-04 DIAGNOSIS — Z4689 Encounter for fitting and adjustment of other specified devices: Secondary | ICD-10-CM

## 2013-06-04 NOTE — Progress Notes (Signed)
79 y.o. Widowed Black female G5P5000 here for pessary check.  Patient has been using following pessary style and size:  Ring pessary 2 3/4".  She is not sexually active.  She describes the following issues with the pessary:  None. Patient here to have pessary removed due to colonoscopy on 06/06/13.  ROS: no vaginal bleeding, no discharge or pelvic pain, no dysuria, trouble voiding or hematuria  Exam:   BP 116/60  Pulse 60  Resp 16  Ht 4\' 11"  (1.499 m)  LMP 08/14/1981 General appearance: alert, cooperative and appears stated age    Pelvic: External genitalia:  no lesions and atrophic appearance              Urethra: normal appearing urethra with no masses, tenderness or lesions              Bartholin's and Skene's: normal                 Vagina: normal appearing vagina with normal color and discharge, no lesions, atrophic              Cervix: normal appearance and no ulcerations noted Bimanual Exam:  Uterus:  uterus is normal size, shape, consistency and non tender                               Adnexa:    not indicated and normal adnexa in size, non tender and no masses                               Anus:  defer exam  Pessary was removed without difficulty.  Pessary was cleansed.  Pessary was not replaced. Patient tolerated procedure well.    A: Cystocele- symptomatic, Rectocele- symptomatic      Ring pessary use for use for support, no reinserted. Pessary cleaned/dried/labeled and will be kept in office until next appointment per patient request.  P:  Return to office in 5 days for reinsertion after colonoscopy.   Patient made appointment.   An After Visit Summary was printed and given to the patient.

## 2013-06-08 NOTE — Progress Notes (Signed)
Note reviewed, agree with plan.  Airiel Oblinger, MD  

## 2013-06-09 ENCOUNTER — Ambulatory Visit (INDEPENDENT_AMBULATORY_CARE_PROVIDER_SITE_OTHER): Payer: Medicare Other | Admitting: Nurse Practitioner

## 2013-06-09 ENCOUNTER — Encounter: Payer: Self-pay | Admitting: Nurse Practitioner

## 2013-06-09 VITALS — BP 130/82 | HR 56 | Ht 59.0 in | Wt 127.0 lb

## 2013-06-09 DIAGNOSIS — N813 Complete uterovaginal prolapse: Secondary | ICD-10-CM

## 2013-06-09 MED ORDER — NYSTATIN-TRIAMCINOLONE 100000-0.1 UNIT/GM-% EX OINT: TOPICAL_OINTMENT | Freq: Two times a day (BID) | CUTANEOUS | Status: DC

## 2013-06-09 NOTE — Progress Notes (Signed)
79 y.o. Widowed Black female G5P5000 here for pessary check.  Patient has been using following pessary style and size:  2 3/4 " ring pessary.  She is not sexually active.  She describes the following issues with the pessary:  None.  Pessary was removed prior to colonoscopy on 10/22.  She then went for procedure after prep and after review of her medical records and medication it was realized that she had not stopped her iron or asa prior to that day.  She either misunderstood or dint hear that she was to stop med's. She is now rescheduled for January 12 th.  ROS: no discharge or pelvic pain, no dysuria, trouble voiding or hematuria  Exam:   BP 130/82  Pulse 56  Ht 4\' 11"  (1.499 m)  Wt 127 lb (57.607 kg)  BMI 25.64 kg/m2  LMP 08/14/1981 General appearance: alert, cooperative and appears stated age no problems with incontinence or vaginal discharge.   Pelvic: External genitalia:  no lesions, inside of both legs is a red scaly rash that may be fungal.              Urethra: normal appearing urethra with no masses, tenderness or lesions              Bartholin's and Skene's: normal                 Vagina: normal appearing vagina with normal color and discharge, no lesions              Cervix: normal appearance Bimanual Exam:  Uterus:  Not examined                               Adnexa:    Not examined                                Pessary was  already removed at last weeks visit   Pessary was cleansed.  Pessary was replaced. Patient tolerated procedure well.    A:  Uterovaginal prolapse with use of a ring pessary      Colonoscopy rescheduled to Jan 12 th, 2015  Rash inner thighs that may be fungal  P:  Return to office in on Jan 9 th for removal of pessary prior to her pep on the weekend and colonoscopy on the 12 th. 2 days later will plan to reinsert. Triamcinolone and Nystatin topical twice daily prn - if no help to call back.           An After Visit Summary was printed and given to the  patient.

## 2013-06-09 NOTE — Patient Instructions (Signed)
Will return on Jan 9th for removal of pessary prior to colonoscopy on Jan 12, then return 2 days after colonoscopy for insertion.

## 2013-06-11 NOTE — Progress Notes (Signed)
Encounter reviewed by Dr. Brook Silva.  

## 2013-07-24 ENCOUNTER — Telehealth: Payer: Self-pay | Admitting: Nurse Practitioner

## 2013-07-24 NOTE — Telephone Encounter (Signed)
Patient is calling saying that her pharmacy wont fill  conjugated estrogens (PREMARIN) vaginal cream  Place vaginally 2 (two) times a week., Starting 04/07/2013, Until Discontinued, Sample, Last Dose: Not Recorded  Refills: 11 ordered Pharmacy: [id number] - [place], [place] - [address] Without the doctors approval stated there wasn't any refills.

## 2013-07-25 NOTE — Telephone Encounter (Signed)
RX was sent 04/07/13 #42.5 gram/11 refills was sent S[facility]w [facility]" she does have a rf ready for her but won't have any more refills Called in 8 additonal refills, patient notified and aware.

## 2013-08-18 ENCOUNTER — Encounter: Payer: Self-pay | Admitting: Nurse Practitioner

## 2013-08-18 ENCOUNTER — Encounter (INDEPENDENT_AMBULATORY_CARE_PROVIDER_SITE_OTHER): Payer: Medicare Other | Admitting: Nurse Practitioner

## 2013-08-18 DIAGNOSIS — N813 Complete uterovaginal prolapse: Secondary | ICD-10-CM

## 2013-08-18 NOTE — Progress Notes (Signed)
This encounter was created in error - please disregard.

## 2013-08-22 ENCOUNTER — Encounter: Payer: Self-pay | Admitting: Certified Nurse Midwife

## 2013-08-22 ENCOUNTER — Ambulatory Visit (INDEPENDENT_AMBULATORY_CARE_PROVIDER_SITE_OTHER): Payer: Medicare Other | Admitting: Certified Nurse Midwife

## 2013-08-22 VITALS — BP 118/64 | HR 68 | Resp 16 | Ht 59.0 in | Wt 127.0 lb

## 2013-08-22 DIAGNOSIS — Z4689 Encounter for fitting and adjustment of other specified devices: Secondary | ICD-10-CM

## 2013-08-22 NOTE — Progress Notes (Signed)
80 y.o. Widowed Black female G5P5000 here for pessary check.  Patient has been using following pessary style and size:  Ring pessary, size 2 3/4".  She is not sexually active.  She describes the following issues with the pessary:  none. Patient having colonoscopy on 08-25-13 and needs pessary removed. She will return after colonoscopy recovery for reinsertion. ROS: no vaginal bleeding, no discharge or pelvic pain, no dysuria, trouble voiding or hematuria  Exam:   BP 118/64  Pulse 68  Resp 16  Ht 4\' 11"  (1.499 m)  Wt 127 lb (57.607 kg)  BMI 25.64 kg/m2  LMP 08/14/1981 General appearance: alert, cooperative and appears stated age no lymph node enlargement inguinal area  Pelvic: External genitalia:  no lesions and atrophic appearance              Urethra: not tender, urethral meatus or bladder non tender              Bartholin's and Skene's: Bartholin's, Urethra, Skene's normal                 Vagina; atrophic appearing vagina with normal color and discharge, no lesions, no ulcerations or excoriations noted.               Cervix: normal appearance and no excoriations or ulcerations Bimanual Exam:  Uterus:  uterus is normal size, shape, consistency and non tender, with prolapse gr 1, cystocele gr 2 and rectocele gr 1-2 noted                               Adnexa:    normal adnexa in size, non tender and no masses                               Anus:  defer exam  Pessary was removed without difficulty.  Pessary was cleansed.  Pessary was not replaced. Patient tolerated procedure well.    A: Normal pelvic exam uterine prolapse, cystocele/rectocele with Pessary use for support Pessary not reinserted due to upcoming colonoscopy, patient to return after procedure recovery for reinsertion  Pessary cleaned and stored here by request of patient for use at next appointment.  CP: RV as scheduled         An After Visit Summary was printed and given to the patient.

## 2013-08-25 NOTE — Progress Notes (Signed)
Reviewed personally.  M. Suzanne Jemina Scahill, MD.  

## 2013-08-26 ENCOUNTER — Encounter: Payer: Self-pay | Admitting: Nurse Practitioner

## 2013-08-26 ENCOUNTER — Ambulatory Visit (INDEPENDENT_AMBULATORY_CARE_PROVIDER_SITE_OTHER): Payer: Medicare Other | Admitting: Nurse Practitioner

## 2013-08-26 VITALS — BP 130/64 | HR 72 | Ht 59.0 in | Wt 125.0 lb

## 2013-08-26 DIAGNOSIS — N813 Complete uterovaginal prolapse: Secondary | ICD-10-CM

## 2013-08-26 DIAGNOSIS — Z4689 Encounter for fitting and adjustment of other specified devices: Secondary | ICD-10-CM

## 2013-08-26 MED ORDER — ESTROGENS, CONJUGATED 0.625 MG/GM VA CREA: TOPICAL_CREAM | VAGINAL | Status: DC

## 2013-08-26 NOTE — Progress Notes (Signed)
Subjective:   80 y.o. Widowed Black female G5P5000 here for pessary check.  Patient has been using following pessary style and size:  2 3/4 " ring pessary with support.  She describes the following issues with the pessary: none since pessary was removed on Friday.  She has noted a little problem with starting flow since pessary is out.  She did well with colonoscopy yesterday.  There were 2 small polyps removed but pathology is not back   Use of protective clothing such as Depends or pads yes. Problems with protective clothing with rash no.  Constipation issues with use of pessary no.  She is not sexually active.     ROS:   No breast pain or new or enlarging lumps on self exam,  no abnormal bleeding, pelvic pain or discharge, no dysuria, trouble voiding or hematuria, no dysuria, trouble voiding or hematuria. Compliant to use of vaginal cream Yes.   General Exam:    BP 130/64  Pulse 72  Ht 4\' 11"  (1.499 m)  Wt 125 lb (56.7 kg)  BMI 25.23 kg/m2  LMP 08/14/1981  General appearance: alert, cooperative and appears stated age   Pelvic: External genitalia:  no lesions   Before pessary was removed no prolapse over the pessary because pessary was already out.   In correct position - NA - pessary was out for colonoscopy.   Urethra: normal appearing urethra with no masses, tenderness or lesions              Vagina: normal appearing vagina with normal color and discharge, no lesions.  There are No abrasions or ulcerations.               Cervix: normal appearance Cervical lesions were not found   Bimanual Exam:  Uterus:  not examined"uterus is normal size, shape, consistency and non tender"}                               Adnexa:    not indicated                             Pessary was already removed last Friday.  Pessary was cleansed with Betadine.  Pessary was replaced. Patient tolerated procedure well.    Assement :  Cystocele- symptomatic   Use of pessary continued   Plan:    Return to  office in 2 months for recheck.         Continue with 1/2 gm Vaginal E cream 3 times weekly    An After Visit Summary was printed and given to the patient.

## 2013-08-26 NOTE — Patient Instructions (Signed)
Continue with vaginal estrogen 3 X weekly

## 2013-08-28 NOTE — Progress Notes (Signed)
Encounter reviewed by Dr. Chadwin Fury Silva.  

## 2013-10-27 ENCOUNTER — Encounter: Payer: Self-pay | Admitting: Nurse Practitioner

## 2013-10-27 ENCOUNTER — Ambulatory Visit (INDEPENDENT_AMBULATORY_CARE_PROVIDER_SITE_OTHER): Payer: Medicare Other | Admitting: Nurse Practitioner

## 2013-10-27 VITALS — BP 140/86 | HR 68 | Ht 59.0 in | Wt 122.0 lb

## 2013-10-27 DIAGNOSIS — Z4689 Encounter for fitting and adjustment of other specified devices: Secondary | ICD-10-CM

## 2013-10-27 DIAGNOSIS — N813 Complete uterovaginal prolapse: Secondary | ICD-10-CM

## 2013-10-27 NOTE — Progress Notes (Signed)
Subjective:   80 y.o. Widowed Black female G5P5000 here for pessary check.  Patient has been using following pessary style and size:  2 3/4 "ring pessary with support.  She describes the following issues with the pessary:  none.   Use of protective clothing such as Depends or pads yes. Problems with protective clothing with rash no.  Constipation issues with use of pessary no.  She is not sexually active.     ROS:   no breast pain or new or enlarging lumps on self exam,  no abnormal bleeding, pelvic pain or discharge,   no dysuria, trouble voiding or hematuria. Compliant to use of vaginal cream Yes.   General Exam:    BP 140/86  Pulse 68  Ht 4\' 11"  (1.499 m)  Wt 122 lb (55.339 kg)  BMI 24.63 kg/m2  LMP 08/14/1981  General appearance: alert, cooperative, appears stated age and no distress   Pelvic: External genitalia:  no lesions   Before pessary was removed no prolapse over the pessary   In correct position yes              Urethra: normal appearing urethra with no masses, tenderness or lesions              Vagina: normal appearing vagina with normal color and discharge, no lesions.  There are no abrasions or ulcerations.               Cervix: normal appearance Cervical lesions were found at the usual location at 3 and 9:0 o'clock positions without bleeding   Bimanual Exam:  Uterus:  not examined                               Adnexa:    not indicated                             Pessary was removed without difficulty without using forceps.  Pessary was cleansed with Betadine.  Pessary was replaced. Patient tolerated procedure well.    Assessment :  No contraindication to continuing hormonal contraception,   Cystocele- symptomatic    Use of pessary continued   Plan:    Return to office in 2 months for recheck.         Continue with 1/2 gm of vaginal E cream 3 times weekly    An After Visit Summary was printed and given to the patient.

## 2013-10-27 NOTE — Patient Instructions (Signed)
Return in 2 months

## 2013-10-28 NOTE — Progress Notes (Signed)
Encounter reviewed by Dr. Brook Silva.  

## 2013-12-12 ENCOUNTER — Ambulatory Visit (INDEPENDENT_AMBULATORY_CARE_PROVIDER_SITE_OTHER): Payer: Medicare Other | Admitting: Diagnostic Neuroimaging

## 2013-12-12 ENCOUNTER — Encounter: Payer: Self-pay | Admitting: Diagnostic Neuroimaging

## 2013-12-12 ENCOUNTER — Encounter (INDEPENDENT_AMBULATORY_CARE_PROVIDER_SITE_OTHER): Payer: Self-pay

## 2013-12-12 VITALS — BP 183/93 | HR 81 | Temp 98.5°F | Ht 58.5 in | Wt 119.5 lb

## 2013-12-12 DIAGNOSIS — IMO0002 Reserved for concepts with insufficient information to code with codable children: Secondary | ICD-10-CM

## 2013-12-12 DIAGNOSIS — M5416 Radiculopathy, lumbar region: Secondary | ICD-10-CM

## 2013-12-12 DIAGNOSIS — G609 Hereditary and idiopathic neuropathy, unspecified: Secondary | ICD-10-CM

## 2013-12-12 NOTE — Progress Notes (Signed)
[facility]  PATIENT: Anna Mccoy DOB: 09/06/1924  REFERRING CLINICIAN: ckman HISTORY FROM: patient  REASON FOR VISIT: new consult   HISTORICAL  CHIEF COMPLAINT:  Chief Complaint  Patient presents with  . New Evaluation    Beckman,numbness in right foot, worsening with difficulty using the foot and  driving  paper referral    HISTORY OF PRESENT ILLNESS:   80 year old right-handed female here for evaluation of right foot numbness.  2009, 2010, patient developed right foot numbness, low back pain. She was diagnosed with lumbar spinal stenosis, status post surgery in 2010. Symptoms improved. However symptoms returned 2013 transiently and approved again. Patient's symptoms returned again in 2015, and now patient is having more difficulty driving her car because she cannot feel the gas or brake pedal properly. She denies any significant increase in low back pain. No significant problems with her left foot. No problems with her hands. No significant weakness.  REVIEW OF SYSTEMS: Full 14 system review of systems performed and notable only for joint pain allergies numbness.  ALLERGIES: Allergies  Allergen Reactions  . Ciprofloxacin Diarrhea  . Lidocaine   . Neosporin [Neomycin-Bacitracin Zn-Polymyx]   . Sulfa Antibiotics     HOME MEDICATIONS: Outpatient Prescriptions Prior to Visit  Medication Sig Dispense Refill  . abatacept (ORENCIA) 250 MG injection Inject into the vein every 30 (thirty) days.       Marland Kitchen acetaminophen (TYLENOL) 500 MG tablet Take 1,000 mg by mouth daily.      Marland Kitchen amLODipine (NORVASC) 10 MG tablet Take 10 mg by mouth daily.      Marland Kitchen aspirin 81 MG tablet Take 81 mg by mouth every 3 (three) days.       . clopidogrel (PLAVIX) 75 MG tablet Take 75 mg by mouth daily with breakfast.      . Cod Liver Oil 10 MINIM CAPS Take by mouth.      . conjugated estrogens (PREMARIN) vaginal cream Use 3 times weekly  42.5 g  11  . Fe Cbn-Fe Gluc-FA-B12-C-DSS  (FERRALET 90 PO) Take by mouth every 3 (three) days.       . folic acid (FOLVITE) 1 MG tablet Take 1 mg by mouth daily.      Marland Kitchen leflunomide (ARAVA) 20 MG tablet Take 20 mg by mouth every other day.      . levothyroxine (SYNTHROID, LEVOTHROID) 75 MCG tablet Take 75 mcg by mouth daily before breakfast.      . nystatin-triamcinolone ointment (MYCOLOG) Apply topically 2 (two) times daily.  30 g  0  . potassium chloride SA (K-DUR,KLOR-CON) 20 MEQ tablet Take 20 mEq by mouth 2 (two) times daily.      . valsartan-hydrochlorothiazide (DIOVAN-HCT) 160-25 MG per tablet Take 1 tablet by mouth daily.      . Vitamin D, Ergocalciferol, (DRISDOL) 50000 UNITS CAPS Take 50,000 Units by mouth every 14 (fourteen) days.      Marland Kitchen HYDROcodone-acetaminophen (VICODIN) 5-500 MG per tablet Take 1 tablet by mouth every 6 (six) hours as needed for pain.       No facility-administered medications prior to visit.    PAST MEDICAL HISTORY: Past Medical History  Diagnosis Date  . Abnormal EKG   . Hypertension   . Thyroid disease     HYPOTHYROIDISM  . Rheumatoid arthritis(714.0)   . Hx-TIA (transient ischemic attack)   . Chest pain     H/O  . Fatigue   . RBBB   . Aortic valve sclerosis   .  Mitral valve regurgitation     TRIVIAL  . Tricuspid valve regurgitation     MILD  . Procidentia of uterus     uses pessary  . Menopausal state age 73  . Vitamin D deficiency disease   . DDD (degenerative disc disease), lumbar   . Post-menopausal bleeding 02/2011    Endo Biopsy 7/12 benign, no hyperplasia  . Arthritis     PAST SURGICAL HISTORY: Past Surgical History  Procedure Laterality Date  . Cholecystectomy, laparoscopic  2002  . Laminectomy and microdiscectomy lumbar spine  01/23/09  . Colonoscopy w/ biopsies  6/09    sigmoid diverticuli recheck 5 years  . Vaginal delivery      x5    FAMILY HISTORY: Family History  Problem Relation Age of Onset  . Hypertension Mother   . Stroke Mother   . Hypertension Sister    . Diabetes Sister   . Hypertension Sister   . Diabetes Sister     SOCIAL HISTORY:  History   Social History  . Marital Status: Widowed    Spouse Name: N/A    Number of Children: 34  . Years of Education: 12   Occupational History  .      retired   Social History Main Topics  . Smoking status: Never Smoker   . Smokeless tobacco: Never Used  . Alcohol Use: No  . Drug Use: No  . Sexual Activity: No     Comment: Widowed 01/10/07 age 26   Other Topics Concern  . Not on file   Social History Narrative   Patient is right handed, resides with daughter and sister     PHYSICAL EXAM  Filed Vitals:   12/12/13 0940  BP: 183/93  Pulse: 81  Temp: 98.5 F (36.9 C)  TempSrc: Oral  Height: 4' 10.5" (1.486 m)  Weight: 119 lb 8 oz (54.205 kg)    Not recorded    Body mass index is 24.55 kg/(m^2).  GENERAL EXAM: Patient is in no distress; well developed, nourished and groomed; neck is supple  CARDIOVASCULAR: Regular rate and rhythm, no murmurs, no carotid bruits  NEUROLOGIC: MENTAL STATUS: awake, alert, oriented to person, place and time, recent and remote memory intact, normal attention and concentration, language fluent, comprehension intact, naming intact, fund of knowledge appropriate CRANIAL NERVE: no papilledema on fundoscopic exam, pupils equal and reactive to light, visual fields full to confrontation, extraocular muscles intact, no nystagmus, facial sensation and strength symmetric, hearing intact, palate elevates symmetrically, uvula midline, shoulder shrug symmetric, tongue midline. MOTOR: normal bulk and tone, full strength in the BUE, BLE SENSORY: normal and symmetric to light touch, temperature; ABSENT VIB AT TOES AND ANKLES. ALLODYNIA TO PINPRICK IN RIGHT FOOT.  COORDINATION: finger-nose-finger, fine finger movements normal REFLEXES: BUE 1, KNEES 2, ANKLES 1. DOWN GOING TOES.  GAIT/STATION: narrow based gait; STOOPED POSTURE. SLOW AND CAUTIOUS. UNSTEADY ON  TEOS AND HEELS. CANNOT TANDEM. Romberg is negative    DIAGNOSTIC DATA (LABS, IMAGING, TESTING) - I reviewed patient records, labs, notes, testing and imaging myself where available.  Lab Results  Component Value Date   WBC 8.3 01/15/2009   HGB 12.1 01/15/2009   HCT 37.4 01/15/2009   MCV 100.2* 01/15/2009   PLT 339 01/15/2009      Component Value Date/Time   NA 134* 01/22/2009 0500   K 3.5 01/22/2009 0500   CL 100 01/22/2009 0500   CO2 30 01/22/2009 0500   GLUCOSE 108* 01/22/2009 0500   BUN 8 01/22/2009  0500   CREATININE 1.07 01/22/2009 0500   CALCIUM 8.3* 01/22/2009 0500   PROT 6.6 01/15/2009 0927   ALBUMIN 3.7 01/15/2009 0927   AST 16 01/15/2009 0927   ALT 12 01/15/2009 0927   ALKPHOS 53 01/15/2009 0927   BILITOT 0.7 01/15/2009 0927   GFRNONAA 49* 01/22/2009 0500   GFRAA  Value: 60        The eGFR has been calculated using the MDRD equation. This calculation has not been validated in all clinical situations. eGFR's persistently <60 mL/min signify possible Chronic Kidney Disease.* 01/22/2009 0500   No results found for this basename: CHOL, HDL, LDLCALC, LDLDIRECT, TRIG, CHOLHDL   No results found for this basename: HGBA1C   No results found for this basename: VITAMINB12   No results found for this basename: TSH    I reviewed images myself and agree with interpretation. -VRP  12/10/08 CT MYELOGRAM (lumbar spine) 1. Lumbar spine dextroscoliosis with degenerative disc disease and facet hypertrophy at all levels.  2. Mild left foraminal encroachment L1-2.  3. Mild multifactorial spinal stenosis and bilateral foraminal encroachment L2-3 and L3-4.  4. Moderately severe multifactorial spinal stenosis L4-5 with myelographic block encountered at this level.  5. Mild multifactorial spinal stenosis and right-sided foraminal narrowing L5-S1.   ASSESSMENT AND PLAN  80 y.o. year old female here with history of mod-severe spinal stenosis at L4-5, multi-level degenerative dz, s/p lumbar decompression in 2010,  with intermittent right foot numbness. Also with general difficulty feeling her feet and driving.  Ddx: lumbar radiculopathy/radiculitis + neuropathy  PLAN: - neuropathy lab eval - patient not interested in surgical eval of lumbar spine dz; I agree with conservative mgmt of lumbar spine - PT eval - caution with driving; should probably transition away from driving given her physical exam findings   Orders Placed This Encounter  Procedures  . Neuropathy Panel  . Vitamin B12  . TSH  . Hemoglobin A1c  . Ambulatory referral to Physical Therapy   Return in about 6 months (around 06/14/2014) for with Charlott Holler or Decarlos Empey.    Penni Bombard, MD 11/15/4617, 01:22 AM Certified in Neurology, Neurophysiology and Neuroimaging  [facility] [address] [place], [place] [address] [phone]

## 2013-12-12 NOTE — Patient Instructions (Signed)
I will check lab testing.  Try physical therapy.  Caution with driving. It may be time to transition away from driving for your safety.

## 2013-12-16 LAB — NEUROPATHY PANEL
A/G RATIO SPE: 1.3 (ref 0.7–2.0)
ALBUMIN ELP: 4.1 g/dL (ref 3.2–5.6)
ALPHA 1: 0.2 g/dL (ref 0.1–0.4)
ANA: NEGATIVE
Alpha 2: 1 g/dL (ref 0.4–1.2)
Angio Convert Enzyme: 30 U/L (ref 14–82)
BETA: 1.1 g/dL (ref 0.6–1.3)
GAMMA GLOBULIN: 0.7 g/dL (ref 0.5–1.6)
Globulin, Total: 3.1 g/dL (ref 2.0–4.5)
RHEUMATOID FACTOR: 17 [IU]/mL — AB (ref 0.0–13.9)
SED RATE: 20 mm/h (ref 0–40)
TOTAL PROTEIN: 7.2 g/dL (ref 6.0–8.5)
TSH: 0.404 u[IU]/mL — ABNORMAL LOW (ref 0.450–4.500)
VIT D 25 HYDROXY: 35.5 ng/mL (ref 30.0–100.0)
Vitamin B-12: 499 pg/mL (ref 211–946)

## 2013-12-16 LAB — HEMOGLOBIN A1C
Est. average glucose Bld gHb Est-mCnc: 117 mg/dL
Hgb A1c MFr Bld: 5.7 % — ABNORMAL HIGH (ref 4.8–5.6)

## 2013-12-30 ENCOUNTER — Ambulatory Visit (INDEPENDENT_AMBULATORY_CARE_PROVIDER_SITE_OTHER): Payer: Medicare Other | Admitting: Nurse Practitioner

## 2013-12-30 ENCOUNTER — Encounter: Payer: Self-pay | Admitting: Nurse Practitioner

## 2013-12-30 VITALS — BP 124/70 | HR 76 | Ht 59.0 in | Wt 120.0 lb

## 2013-12-30 DIAGNOSIS — N813 Complete uterovaginal prolapse: Secondary | ICD-10-CM

## 2013-12-30 NOTE — Progress Notes (Signed)
Subjective:   80 y.o. Widowed Black female G5P5000 here for pessary check.  Patient has been using following pessary style and size: 2 3/4" ring pessary with support.  She describes the following issues with the pessary:  none. She is having problems with a numbness of her feet and saw a neurologist.  He did some labs test but her Vit B 12 level was normal.  Looks like her RA panel was positive.  She will be following with PCP in the next few weeks.   Use of protective clothing such as Depends or pads yes. Problems with protective clothing with rash no.  Constipation issues with use of pessary no.  She is not sexually active.     ROS:   No breast pain or new or enlarging lumps on self exam,  no abnormal bleeding, pelvic pain or discharge,   no dysuria, trouble voiding or hematuria. Compliant to use of vaginal cream Yes.   General Exam:    BP 124/70  Pulse 76  Ht 4\' 11"  (1.499 m)  Wt 120 lb (54.432 kg)  BMI 24.22 kg/m2  LMP 08/14/1981  General appearance: alert, cooperative and appears stated age   Pelvic: External genitalia:  no lesions   Before pessary was removed no prolapse over the pessary   In correct position yes              Urethra: normal appearing urethra with no masses, tenderness or lesions              Vagina: normal appearing vagina with normal color and discharge, no lesions.  There are No abrasions or ulcerations.               Cervix: normal appearance Cervical lesions were not found   Bimanual Exam:  Uterus:  not examined                               Adnexa:    not indicated                             Pessary was removed without difficulty without using forceps.  Pessary was cleansed with Betadine.  Pessary was replaced. Patient tolerated procedure well.    Assessment :  No contraindication to continuing hormonal contraception   Cystocele- symptomatic   Use of pessary continued   Plan:    Return to office in 2 months for recheck.         continue with 1/2 gm  of vaginal e cream 3 times a week    An After Visit Summary was printed and given to the patient.

## 2014-01-05 NOTE — Progress Notes (Signed)
Encounter reviewed by Dr. Brook Silva.  

## 2014-03-02 ENCOUNTER — Encounter: Payer: Self-pay | Admitting: Nurse Practitioner

## 2014-03-02 ENCOUNTER — Ambulatory Visit (INDEPENDENT_AMBULATORY_CARE_PROVIDER_SITE_OTHER): Payer: Medicare Other | Admitting: Nurse Practitioner

## 2014-03-02 VITALS — BP 138/70 | HR 64 | Ht 59.0 in | Wt 120.0 lb

## 2014-03-02 DIAGNOSIS — Z4689 Encounter for fitting and adjustment of other specified devices: Secondary | ICD-10-CM

## 2014-03-02 DIAGNOSIS — N813 Complete uterovaginal prolapse: Secondary | ICD-10-CM

## 2014-03-02 NOTE — Patient Instructions (Signed)
Will recheck in 2 months.

## 2014-03-02 NOTE — Progress Notes (Signed)
Subjective:   80 y.o. Widowed Black female G5P5000 here for pessary check.  Patient has been using following pessary style and size:  2 3/4" ring with support.  She describes the following issues with the pessary:  none.   Use of protective clothing such as Depends or pads Yes.  . Problems with protective clothing with rash No..  Constipation issues with use of pessary No..  She is not sexually active.     ROS:   no breast pain or new or enlarging lumps on self exam,  no abnormal bleeding, pelvic pain or discharge,   no dysuria, trouble voiding or hematuria. Compliant to use of vaginal cream Yes.   General Exam:    BP 138/70  Pulse 64  Ht 4\' 11"  (1.499 m)  Wt 120 lb (54.432 kg)  BMI 24.22 kg/m2  LMP 08/14/1981  General appearance: alert, cooperative and appears stated age   Pelvic: External genitalia:  no lesions   Before pessary was removed No. prolapse over the pessary   In correct position Yes.                Urethra: normal appearing urethra with no masses, tenderness or lesions              Vagina: normal appearing vagina with normal color and discharge, no lesions.  There are No abrasions or ulcerations.               Cervix: normal appearance Cervical lesions were not found   Bimanual Exam:  Uterus:  normal size, contour, position, consistency, mobility, non-tender                               Adnexa:    normal adnexa in size, non tender and no masses                              Pessary was removed without difficulty without using forceps.  Pessary was cleansed with Betadine.  Pessary was replaced. Patient tolerated procedure well.    Assessment :  SUI, Urge incontinence, Cystocele- symptomatic        No contraindications to continuing hormonal cream   Use of pessary continued   Plan:    Return to office in 2 months for recheck.         continue with Premarin vaginal cream 1/2 gm 2-3 times a week    An After Visit Summary was printed and given to the patient.

## 2014-03-03 NOTE — Progress Notes (Signed)
Encounter reviewed by Dr. Brook Silva.  

## 2014-04-24 HISTORY — PX: EYE SURGERY: SHX253

## 2014-05-04 ENCOUNTER — Ambulatory Visit (INDEPENDENT_AMBULATORY_CARE_PROVIDER_SITE_OTHER): Payer: Medicare Other | Admitting: Nurse Practitioner

## 2014-05-04 ENCOUNTER — Encounter: Payer: Self-pay | Admitting: Nurse Practitioner

## 2014-05-04 VITALS — BP 130/76 | HR 72 | Ht 59.0 in | Wt 119.0 lb

## 2014-05-04 DIAGNOSIS — N813 Complete uterovaginal prolapse: Secondary | ICD-10-CM

## 2014-05-04 NOTE — Progress Notes (Signed)
Subjective:   80 y.o. Widowed Black female G5P5000 here for pessary check.  Patient has been using following pessary style and size:  2 3/4" ring with support.  She describes the following issues with the pessary:  none.   Use of protective clothing such as Depends or pads Yes.  . Problems with protective clothing with rash No..  Constipation issues with use of pessary No..  She is not sexually active.     ROS:   no breast pain or new or enlarging lumps on self exam,  no abnormal bleeding, pelvic pain or discharge,   no dysuria, trouble voiding or hematuria. Compliant to use of vaginal cream Yes.   General Exam:    BP 130/76  Pulse 72  Ht 4\' 11"  (1.499 m)  Wt 119 lb (53.978 kg)  BMI 24.02 kg/m2  LMP 08/14/1981  General appearance: alert, cooperative and appears stated age   Pelvic: External genitalia:  no lesions   Before pessary was removed No. prolapse over the pessary   In correct position Yes.                Urethra: normal appearing urethra with no masses, tenderness or lesions              Vagina: normal appearing vagina with normal color and discharge, no lesions.  There are No abrasions or ulcerations.               Cervix: normal appearance Cervical lesions were not found   Bimanual Exam:  Uterus:  not examined"uterus is normal size, shape, consistency and non tender"}                               Adnexa:    not indicated                             Pessary was removed without difficulty without using forceps.  Pessary was cleansed with Betadine.  Pessary was replaced. Patient tolerated procedure well.    Assessment :  SUI, Urge incontinence, Cystocele- symptomatic       No contraindications to continuing hormonal cream     Use of pessary continued   Plan:    Return to office in 2 months for recheck.         Continue with Premarin Vaginal cream 1/2 gm 2-3 times a week  An After Visit Summary was printed and given to the patient.

## 2014-05-05 ENCOUNTER — Encounter: Payer: Self-pay | Admitting: Nurse Practitioner

## 2014-05-05 NOTE — Progress Notes (Signed)
Encounter reviewed by Dr. Brook Silva.  

## 2014-06-15 ENCOUNTER — Encounter: Payer: Self-pay | Admitting: Nurse Practitioner

## 2014-06-15 ENCOUNTER — Ambulatory Visit: Payer: Medicare Other | Admitting: Nurse Practitioner

## 2014-06-30 ENCOUNTER — Encounter: Payer: Self-pay | Admitting: Nurse Practitioner

## 2014-06-30 ENCOUNTER — Ambulatory Visit (INDEPENDENT_AMBULATORY_CARE_PROVIDER_SITE_OTHER): Payer: Medicare Other | Admitting: Nurse Practitioner

## 2014-06-30 ENCOUNTER — Ambulatory Visit: Payer: Medicare Other | Admitting: Nurse Practitioner

## 2014-06-30 VITALS — BP 130/82 | HR 76 | Ht 59.0 in | Wt 120.0 lb

## 2014-06-30 DIAGNOSIS — Z4689 Encounter for fitting and adjustment of other specified devices: Secondary | ICD-10-CM

## 2014-06-30 DIAGNOSIS — N813 Complete uterovaginal prolapse: Secondary | ICD-10-CM

## 2014-06-30 NOTE — Patient Instructions (Signed)
Return in 2 months.  If any urinary symptoms to call back

## 2014-06-30 NOTE — Progress Notes (Signed)
Subjective:   80 y.o. Widowed Black female G5P5000 here for pessary check.  Patient has been using following pessary style and size:  2 3/4 " ring pessary with support..  She describes the following issues with the pessary:  None. She currently is having problems with right shoulder pain and inability to sleep last pm.  She is scheduled to see MD today at 1:30.   Use of protective clothing such as Depends or pads Yes.  . Problems with protective clothing with rash No..  Constipation issues with use of pessary No..  She is not sexually active.     ROS:   no breast pain or new or enlarging lumps on self exam,  no abnormal bleeding, pelvic pain or discharge,   no dysuria, trouble voiding or hematuria. Compliant to use of vaginal cream Yes.   General Exam:    BP 130/82 mmHg  Pulse 76  Ht 4\' 11"  (1.499 m)  Wt 120 lb (54.432 kg)  BMI 24.22 kg/m2  LMP 08/14/1981  General appearance: alert, cooperative and appears stated age   Pelvic: External genitalia:  no lesions   Before pessary was removed No. prolapse over the pessary   In correct position Yes.                Urethra: normal appearing urethra with no masses, tenderness or lesions              Vagina: normal appearing vagina with normal color and discharge, no lesions.  There are No abrasions or ulcerations.               Cervix: normal appearance Cervical lesions were not found   Bimanual Exam:  Uterus:  normal size, contour, position, consistency, mobility, non-tender"uterus is normal size, shape, consistency and non tender"}                               Adnexa:    not indicated                             Pessary was removed without difficulty without using forceps.  Pessary was cleansed with Betadine.  Pessary was replaced. Patient tolerated procedure well.    Assessment :  SUI, Urge incontinence, Cystocele- symptomatic       Use of pessary continued   Plan:    Return to office in 2 months for recheck.         Continue with  Premarin vaginal cream    An After Visit Summary was printed and given to the patient.

## 2014-06-30 NOTE — Progress Notes (Signed)
Encounter reviewed by Dr. Dorotea Hand Silva.  

## 2014-07-03 ENCOUNTER — Ambulatory Visit: Payer: Medicare Other | Admitting: Nurse Practitioner

## 2014-08-31 DIAGNOSIS — M0589 Other rheumatoid arthritis with rheumatoid factor of multiple sites: Secondary | ICD-10-CM | POA: Diagnosis not present

## 2014-09-28 ENCOUNTER — Other Ambulatory Visit: Payer: Self-pay | Admitting: Nurse Practitioner

## 2014-09-28 NOTE — Telephone Encounter (Signed)
Medication refill request: premarin cream Last AEX:  ? OV 06/30/14  Next AEX: 08/31/15 Last MMG (if hormonal medication request): 12/15/10 [id number] Refill authorized: 08/26/13 #42.5g/11R. Today #42.5g/11R?

## 2014-10-01 DIAGNOSIS — M0589 Other rheumatoid arthritis with rheumatoid factor of multiple sites: Secondary | ICD-10-CM | POA: Diagnosis not present

## 2014-10-22 ENCOUNTER — Ambulatory Visit (INDEPENDENT_AMBULATORY_CARE_PROVIDER_SITE_OTHER): Payer: Medicare Other | Admitting: Nurse Practitioner

## 2014-10-22 ENCOUNTER — Encounter: Payer: Self-pay | Admitting: Nurse Practitioner

## 2014-10-22 VITALS — BP 130/84 | HR 76 | Ht 59.0 in | Wt 115.0 lb

## 2014-10-22 DIAGNOSIS — E538 Deficiency of other specified B group vitamins: Secondary | ICD-10-CM | POA: Diagnosis not present

## 2014-10-22 DIAGNOSIS — R5383 Other fatigue: Secondary | ICD-10-CM | POA: Diagnosis not present

## 2014-10-22 DIAGNOSIS — R634 Abnormal weight loss: Secondary | ICD-10-CM

## 2014-10-22 DIAGNOSIS — R7309 Other abnormal glucose: Secondary | ICD-10-CM

## 2014-10-22 DIAGNOSIS — R899 Unspecified abnormal finding in specimens from other organs, systems and tissues: Secondary | ICD-10-CM

## 2014-10-22 LAB — TSH: TSH: 0.328 u[IU]/mL — ABNORMAL LOW (ref 0.350–4.500)

## 2014-10-22 LAB — VITAMIN B12: Vitamin B-12: 596 pg/mL (ref 211–911)

## 2014-10-22 MED ORDER — METRONIDAZOLE 0.75 % VA GEL: 1.0000 | Freq: Every day | VAGINAL | Status: DC

## 2014-10-22 NOTE — Progress Notes (Signed)
Subjective:   81 y.o. Widowed AA female G5P5000 here for pessary check.  Patient has been using following pessary style and size: 2 3/4" ring pessary.  She describes the following issues with the pessary: none.   Use of protective clothing such as Depends or pads No.. Problems with protective clothing with rash No..  Constipation issues with use of pessary No..  She is not sexually active.     ROS:   no breast pain or new or enlarging lumps on self exam,  no abnormal bleeding, pelvic pain or discharge,   no dysuria, trouble voiding or hematuria. Compliant to use of vaginal cream Yes.   General Exam:    BP 130/84 mmHg  Pulse 76  Ht 4\' 11"  (1.499 m)  Wt 115 lb (52.164 kg)  BMI 23.21 kg/m2  LMP 08/14/1981  General appearance: alert, cooperative and appears stated age   Pelvic: External genitalia:  no lesions   Before pessary was removed No. prolapse over the pessary   In correct position Yes.                Urethra: normal appearing urethra with no masses, tenderness or lesions              Vagina: atrophic.  Yellow thick vaginal discharge with slight odor. There are abrasions or ulcerations.               Cervix: normal appearance Cervical lesions were found around the parameters most likely from the  BV   Bimanual Exam:  Uterus:  normal size, contour, position, consistency, mobility, non-tender                               Adnexa:    not indicated                             Pessary was removed without difficulty without using forceps.  Pessary was cleansed with Betadine.  Pessary was replaced. Patient tolerated procedure well.    Assement :  Cystocele- symptomatic        Urge incontinece      BV with irritation around the cervix     Use of pessary continued   Plan:    Return to office in 2 months for recheck.   Metrogel with use HS X 5   Will make her a repeat apt. With Dr. 10/12/1981 for nect Wednesday for follow up on her labs.   Repeat TSH, Vit B 12, HGB AIC         An After Visit Summary was printed and given to the patient.

## 2014-10-22 NOTE — Patient Instructions (Addendum)
Recheck  in 2 months  Use Metrogel every night for 5 nights  Apt. With Dr. Benedetto Goad next Wednesday - take a copyof labs with you  Today's  labs will be faxed by [facility] lab

## 2014-10-23 LAB — HEMOGLOBIN A1C
Hgb A1c MFr Bld: 5.3 % (ref <5.7)
MEAN PLASMA GLUCOSE: 105 mg/dL (ref <117)

## 2014-10-26 DIAGNOSIS — M150 Primary generalized (osteo)arthritis: Secondary | ICD-10-CM | POA: Diagnosis not present

## 2014-10-26 DIAGNOSIS — M0589 Other rheumatoid arthritis with rheumatoid factor of multiple sites: Secondary | ICD-10-CM | POA: Diagnosis not present

## 2014-10-26 DIAGNOSIS — M5136 Other intervertebral disc degeneration, lumbar region: Secondary | ICD-10-CM | POA: Diagnosis not present

## 2014-10-26 DIAGNOSIS — M7062 Trochanteric bursitis, left hip: Secondary | ICD-10-CM | POA: Diagnosis not present

## 2014-10-26 NOTE — Progress Notes (Signed)
Encounter reviewed by Dr. Doniesha Landau Silva.  

## 2014-10-28 DIAGNOSIS — R634 Abnormal weight loss: Secondary | ICD-10-CM | POA: Diagnosis not present

## 2014-10-28 DIAGNOSIS — E039 Hypothyroidism, unspecified: Secondary | ICD-10-CM | POA: Diagnosis not present

## 2014-10-29 DIAGNOSIS — H40013 Open angle with borderline findings, low risk, bilateral: Secondary | ICD-10-CM | POA: Diagnosis not present

## 2014-11-23 DIAGNOSIS — M0589 Other rheumatoid arthritis with rheumatoid factor of multiple sites: Secondary | ICD-10-CM | POA: Diagnosis not present

## 2014-11-25 DIAGNOSIS — H25011 Cortical age-related cataract, right eye: Secondary | ICD-10-CM | POA: Diagnosis not present

## 2014-11-25 DIAGNOSIS — H2511 Age-related nuclear cataract, right eye: Secondary | ICD-10-CM | POA: Diagnosis not present

## 2014-11-25 DIAGNOSIS — H40013 Open angle with borderline findings, low risk, bilateral: Secondary | ICD-10-CM | POA: Diagnosis not present

## 2014-11-25 DIAGNOSIS — H3531 Nonexudative age-related macular degeneration: Secondary | ICD-10-CM | POA: Diagnosis not present

## 2014-12-10 DIAGNOSIS — E039 Hypothyroidism, unspecified: Secondary | ICD-10-CM | POA: Diagnosis not present

## 2014-12-21 DIAGNOSIS — M0589 Other rheumatoid arthritis with rheumatoid factor of multiple sites: Secondary | ICD-10-CM | POA: Diagnosis not present

## 2014-12-22 ENCOUNTER — Ambulatory Visit: Payer: Medicare Other | Admitting: Nurse Practitioner

## 2014-12-24 ENCOUNTER — Ambulatory Visit (INDEPENDENT_AMBULATORY_CARE_PROVIDER_SITE_OTHER): Payer: Medicare Other | Admitting: Nurse Practitioner

## 2014-12-24 ENCOUNTER — Encounter: Payer: Self-pay | Admitting: Nurse Practitioner

## 2014-12-24 VITALS — BP 146/74 | HR 80 | Ht 59.0 in | Wt 115.4 lb

## 2014-12-24 DIAGNOSIS — N8110 Cystocele, unspecified: Secondary | ICD-10-CM

## 2014-12-24 DIAGNOSIS — IMO0002 Reserved for concepts with insufficient information to code with codable children: Secondary | ICD-10-CM

## 2014-12-24 NOTE — Patient Instructions (Signed)
Recheck in 2 months.

## 2014-12-24 NOTE — Progress Notes (Signed)
Subjective:   81 y.o. Widowed Black female G5P5000 here for pessary check.  Patient has been using following pessary style and size:  2 3/4" ring pessary with support.  She describes the following issues with the pessary:  None.  She used medications for BV since last here and did well.  She is also followed by Dr. Andrey Campanile.   Use of protective clothing such as Depends or pads Yes.  . Problems with protective clothing with rash No..  Constipation issues with use of pessary No..  She is not sexually active.     ROS:   no breast pain or new or enlarging lumps on self exam,  no abnormal bleeding, pelvic pain or discharge,   no dysuria, trouble voiding or hematuria. Compliant to use of vaginal cream Yes.   General Exam:    BP 146/74 mmHg  Pulse 80  Ht 4\' 11"  (1.499 m)  Wt 115 lb 6.4 oz (52.345 kg)  BMI 23.30 kg/m2  LMP 08/14/1981  General appearance: alert, cooperative, appears stated age and no distress   Pelvic: External genitalia:  no lesions   Before pessary was removed No. prolapse over the pessary   In correct position Yes.                Urethra: normal appearing urethra with no masses, tenderness or lesions              Vagina: normal appearing vagina with normal color and discharge, no lesions.  There are No abrasions or ulcerations.               Cervix: normal appearance Cervical lesions were not found   Bimanual Exam:  Uterus:  not examined                               Adnexa:    not indicated                             Pessary was removed without difficulty without using forceps.  Pessary was cleansed with Betadine.  Pessary was replaced. Patient tolerated procedure well.    Assessment :  Cystocele- symptomatic        Urge Incontinence   BV that has resolved   Use of pessary continued   Plan:    Return to office in 2 months for recheck.         if any problems to follow up here    An After Visit Summary was printed and given to the patient.

## 2014-12-25 NOTE — Progress Notes (Signed)
Encounter reviewed by Dr. Brook Silva.  

## 2015-01-12 DIAGNOSIS — H2511 Age-related nuclear cataract, right eye: Secondary | ICD-10-CM | POA: Diagnosis not present

## 2015-01-31 ENCOUNTER — Emergency Department (HOSPITAL_BASED_OUTPATIENT_CLINIC_OR_DEPARTMENT_OTHER): Payer: Medicare Other

## 2015-01-31 ENCOUNTER — Emergency Department (HOSPITAL_BASED_OUTPATIENT_CLINIC_OR_DEPARTMENT_OTHER)
Admission: EM | Admit: 2015-01-31 | Discharge: 2015-01-31 | Disposition: A | Discharge status: Home / Self Care | Payer: Medicare Other | Source: Home / Self Care | Attending: Emergency Medicine | Admitting: Emergency Medicine

## 2015-01-31 ENCOUNTER — Encounter (HOSPITAL_BASED_OUTPATIENT_CLINIC_OR_DEPARTMENT_OTHER): Payer: Self-pay | Admitting: Emergency Medicine

## 2015-01-31 DIAGNOSIS — Z8673 Personal history of transient ischemic attack (TIA), and cerebral infarction without residual deficits: Secondary | ICD-10-CM

## 2015-01-31 DIAGNOSIS — R51 Headache: Secondary | ICD-10-CM

## 2015-01-31 DIAGNOSIS — H9202 Otalgia, left ear: Secondary | ICD-10-CM

## 2015-01-31 DIAGNOSIS — Z79899 Other long term (current) drug therapy: Secondary | ICD-10-CM

## 2015-01-31 DIAGNOSIS — G4089 Other seizures: Secondary | ICD-10-CM | POA: Diagnosis not present

## 2015-01-31 DIAGNOSIS — E079 Disorder of thyroid, unspecified: Secondary | ICD-10-CM | POA: Insufficient documentation

## 2015-01-31 DIAGNOSIS — I6340 Cerebral infarction due to embolism of unspecified cerebral artery: Secondary | ICD-10-CM | POA: Diagnosis not present

## 2015-01-31 DIAGNOSIS — Z7902 Long term (current) use of antithrombotics/antiplatelets: Secondary | ICD-10-CM

## 2015-01-31 DIAGNOSIS — E559 Vitamin D deficiency, unspecified: Secondary | ICD-10-CM

## 2015-01-31 DIAGNOSIS — M316 Other giant cell arteritis: Secondary | ICD-10-CM | POA: Diagnosis not present

## 2015-01-31 DIAGNOSIS — I10 Essential (primary) hypertension: Secondary | ICD-10-CM

## 2015-01-31 DIAGNOSIS — M1990 Unspecified osteoarthritis, unspecified site: Secondary | ICD-10-CM | POA: Insufficient documentation

## 2015-01-31 DIAGNOSIS — Z7982 Long term (current) use of aspirin: Secondary | ICD-10-CM

## 2015-01-31 DIAGNOSIS — N390 Urinary tract infection, site not specified: Secondary | ICD-10-CM | POA: Diagnosis not present

## 2015-01-31 DIAGNOSIS — S065X0A Traumatic subdural hemorrhage without loss of consciousness, initial encounter: Secondary | ICD-10-CM | POA: Diagnosis not present

## 2015-01-31 DIAGNOSIS — Z8742 Personal history of other diseases of the female genital tract: Secondary | ICD-10-CM

## 2015-01-31 DIAGNOSIS — R519 Headache, unspecified: Secondary | ICD-10-CM

## 2015-01-31 DIAGNOSIS — R42 Dizziness and giddiness: Secondary | ICD-10-CM | POA: Diagnosis not present

## 2015-01-31 DIAGNOSIS — I639 Cerebral infarction, unspecified: Secondary | ICD-10-CM | POA: Diagnosis not present

## 2015-01-31 NOTE — ED Notes (Signed)
Patient reports that her left ear has been hurting for about 3 days

## 2015-01-31 NOTE — Discharge Instructions (Signed)
Headache and Arthritis Headaches and arthritis are common problems. This causes an interest in the possible role of arthritis in causing headaches. Several major forms of arthritis exist. Two of the most common types are:  Rheumatoid arthritis.  Osteoarthritis. Rheumatoid arthritis may begin at any age. It is a condition in which the body attacks some of its own tissues, thinking they do not belong. This leads to destruction of the bony areas around the joints. This condition may afflict any of the body's joints. It usually produces a deformity of the joint. The hands and fingers no longer appear straight but often appear angled towards one side. In some cases, the spine may be involved. Most often it is the vertebrae of the neck (cervical spine). The areas of the neck most commonly afflicted by rheumatoid arthritis are the first and second cervical vertebrae. Curiously, rheumatoid arthritis, though it often produces severe deformities, is not always painful.  The more common form of arthritis is osteoarthritis. It is a wear-and-tear form of arthritis. It usually does not produce deformity of the joints or destruction of the bony tissues. Rather the ligaments weaken. They may be calcified due to the body's attempt to heal the damage. The larger joints of the body and those joints that take the most stress and strain are the most often affected. In the neck region this osteoarthritis usually involves the fifth, sixth and seventh vertebrae. This is because the effects of posture produce the most fatigue on them. Osteoarthritis is often more painful than rheumatoid arthritis.  During workups for arthritis, a test evaluating inflammation, (the sedimentation rate) often is performed. In rheumatoid arthritis, this test will usually be elevated. Other tests for inflammation may also be elevated. In patients with osteoarthritis, x-rays of the neck or jaw joints will show changes from "lipping" of the vertebrae. This  is caused by calcium deposits in the ligaments. Or they may show narrowing of the space between the vertebrae, or spur formation (from calcium deposits). If severe, it may cause obstruction of the holes where the nerves pass from the spine to the body. In rheumatoid arthritis, dislocation of vertebrae may occur in the upper neck. CT scan and MRI in patients with osteoarthritis may show bulging of the discs that cushion the vertebrae. In the most severe cases, herniation of the discs may occur.  Headaches, felt as a pain in the neck, may be caused by arthritis if the first, second or third vertebrae are involved. This condition is due to the nerves that supply the scalp only originating from this area of the spine. Neck pain itself, whether alone or coupled with headaches, can involve any portion of the neck. If the jaw is involved, the symptoms are similar to those of Temporomandibular Joint Syndrome (TMJ).  The progressive severity of rheumatoid arthritis may be slowed by a variety of potent medications. In osteoarthritis, its progression is not usually hindered by medication. The following may be helpful in slowing the advancement of the disorder:  Lifestyle adjustment.  Exercise.  Rest.  Weight loss. Medications, such as the nonsteroidal anti-inflammatory agents (NSAIDs), are useful. They may reduce the pain and improve the reduced motion which occurs in joints afflicted by arthritis. From some studies, the use of acetaminophen appears to be as effective in controlling the pain of arthritis as the NSAIDs. Physical modalities may also be useful for arthritis. They include:  Heat.  Massage.  Exercise. But physical therapy must be prescribed by a caregiver, just as most medications   for arthritis.  Document Released: 10/21/2003 Document Revised: 08/05/2013 Document Reviewed: 11/03/2013 [employer] Patient Information 2015 [employer]. This information is not intended to replace advice given to  you by your health care provider. Make sure you discuss any questions you have with your health care provider.  

## 2015-01-31 NOTE — ED Provider Notes (Signed)
CSN: [id number]     Arrival date & time 01/31/15  2100 History   First MD Initiated Contact with Patient 01/31/15 2120     Chief Complaint  Patient presents with  . Otalgia     (Consider location/radiation/quality/duration/timing/severity/associated sxs/prior Treatment) HPI Comments: Pt come sin with c/o pain in the left. States that the pain started 4 days ago. She states that she has not had fever. Pt has had some nasal congestion. She states that she has pain that radiates form the left forehead to in front of her ear. Denies hearing loss or dizziness  The history is provided by the patient. No language interpreter was used.    Past Medical History  Diagnosis Date  . Abnormal EKG   . Hypertension   . Thyroid disease     HYPOTHYROIDISM  . Rheumatoid arthritis(714.0)   . Hx-TIA (transient ischemic attack)   . Chest pain     H/O  . Fatigue   . RBBB   . Aortic valve sclerosis   . Mitral valve regurgitation     TRIVIAL  . Tricuspid valve regurgitation     MILD  . Procidentia of uterus     uses pessary  . Menopausal state age 74  . Vitamin D deficiency disease   . DDD (degenerative disc disease), lumbar   . Post-menopausal bleeding 02/2011    Endo Biopsy 7/12 benign, no hyperplasia  . Arthritis    Past Surgical History  Procedure Laterality Date  . Cholecystectomy, laparoscopic  2002  . Laminectomy and microdiscectomy lumbar spine  01/23/09  . Colonoscopy w/ biopsies  6/09    sigmoid diverticuli recheck 5 years  . Vaginal delivery      x5  . Eye surgery  04/24/14   Family History  Problem Relation Age of Onset  . Hypertension Mother   . Stroke Mother   . Hypertension Sister   . Diabetes Sister   . Hypertension Sister   . Diabetes Sister    History  Substance Use Topics  . Smoking status: Never Smoker   . Smokeless tobacco: Never Used  . Alcohol Use: No   OB History    Gravida Para Term Preterm AB TAB SAB Ectopic Multiple Living   5 5 5              Review of Systems  All other systems reviewed and are negative.     Allergies  Ciprofloxacin; Lidocaine; Neosporin; and Sulfa antibiotics  Home Medications   Prior to Admission medications   Medication Sig Start Date End Date Taking? Authorizing Provider  abatacept (ORENCIA) 250 MG injection Inject into the vein every 30 (thirty) days.     Historical Provider, MD  acetaminophen (TYLENOL) 500 MG tablet Take 1,000 mg by mouth daily.    Historical Provider, MD  amLODipine (NORVASC) 10 MG tablet Take 10 mg by mouth daily.    Historical Provider, MD  aspirin 81 MG tablet Take 81 mg by mouth every 3 (three) days.     Historical Provider, MD  clopidogrel (PLAVIX) 75 MG tablet Take 75 mg by mouth daily with breakfast.    Historical Provider, MD  clotrimazole-betamethasone (LOTRISONE) cream  10/26/14   Historical Provider, MD  Cod Liver Oil 10 MINIM CAPS Take by mouth.    Historical Provider, MD  Fe Cbn-Fe Gluc-FA-B12-C-DSS (FERRALET 90 PO) Take by mouth every 3 (three) days. IRON SUPPLEMENT    Historical Provider, MD  folic acid (FOLVITE) 1 MG tablet Take 1 mg  by mouth daily.    Historical Provider, MD  leflunomide (ARAVA) 20 MG tablet Take 20 mg by mouth every other day.    Historical Provider, MD  levothyroxine (SYNTHROID, LEVOTHROID) 75 MCG tablet Take 75 mcg by mouth daily before breakfast.    Historical Provider, MD  metroNIDAZOLE (METROGEL) 0.75 % vaginal gel Place 1 Applicatorful vaginally at bedtime. 10/22/14   Ria Comment, FNP  nystatin-triamcinolone ointment [facility]) Apply topically 2 (two) times daily. 06/09/13   Ria Comment, FNP  potassium chloride SA (K-DUR,KLOR-CON) 20 MEQ tablet Take 20 mEq by mouth 2 (two) times daily.    Historical Provider, MD  PREMARIN vaginal cream USE VAGINALLY 3 TIMES WEEKLY 09/28/14   Ria Comment, FNP  valsartan-hydrochlorothiazide (DIOVAN-HCT) 160-25 MG per tablet Take 1 tablet by mouth daily.    Historical Provider, MD  Vitamin D,  Ergocalciferol, (DRISDOL) 50000 UNITS CAPS Take 50,000 Units by mouth every 14 (fourteen) days.    Historical Provider, MD   BP 157/95 mmHg  Pulse 87  Temp(Src) 99 F (37.2 C) (Oral)  Resp 16  Ht 4\' 9"  (1.448 m)  Wt 117 lb (53.071 kg)  BMI 25.31 kg/m2  SpO2 100%  LMP 08/14/1981 Physical Exam  Constitutional: She is oriented to person, place, and time. She appears well-developed and well-nourished.  HENT:  Head: Normocephalic and atraumatic.  Right Ear: External ear normal.  Left Ear: External ear normal.  Mouth/Throat: Oropharynx is clear and moist.  Eyes: Conjunctivae and EOM are normal. Pupils are equal, round, and reactive to light.  Neck: Normal range of motion. Neck supple.  Cardiovascular: Normal rate and regular rhythm.   Pulmonary/Chest: Breath sounds normal.  Abdominal: Soft. Bowel sounds are normal. There is no tenderness.  Musculoskeletal: Normal range of motion.  Neurological: She is alert and oriented to person, place, and time. She exhibits normal muscle tone. Coordination normal.  Skin: Skin is warm and dry. No rash noted.  Psychiatric: She has a normal mood and affect.  Nursing note and vitals reviewed.   ED Course  Procedures (including critical care time) Labs Review Labs Reviewed - No data to display  Imaging Review Ct Head Wo Contrast  01/31/2015   CLINICAL DATA:  Acute onset of left ear pain and dizziness. Initial encounter.  EXAM: CT HEAD WITHOUT CONTRAST  TECHNIQUE: Contiguous axial images were obtained from the base of the skull through the vertex without intravenous contrast.  COMPARISON:  None.  FINDINGS: There is no evidence of acute infarction, mass lesion, or intra- or extra-axial hemorrhage on CT.  Prominence of the ventricles and sulci reflects mild to moderate cortical volume loss. Mild cerebellar atrophy is noted. Scattered periventricular and subcortical white matter change likely reflects small vessel ischemic microangiopathy.  The brainstem  and fourth ventricle are within normal limits. The basal ganglia are unremarkable in appearance. The cerebral hemispheres demonstrate grossly normal gray-white differentiation. No mass effect or midline shift is seen.  There is no evidence of fracture; visualized osseous structures are unremarkable in appearance. The visualized portions of the orbits are within normal limits. The paranasal sinuses and mastoid air cells are well-aerated. No significant soft tissue abnormalities are seen.  IMPRESSION: 1. No acute intracranial pathology seen on CT. 2. Mild to moderate cortical volume loss and scattered small vessel ischemic microangiopathy. 3. The left external auditory canal and inner ear are grossly unremarkable in appearance. The left mastoid air cells are well-aerated.   Electronically Signed   By: 02/02/2015 M.D.   On:  01/31/2015 22:50     EKG Interpretation None      MDM   Final diagnoses:  Headache  Otalgia, left    Pt is neurologically intact. Discussed follow up with pcp for continued symptoms. Discussed case with Dr. Criss Alvine. Discussed return precautions.no consistent with a stroke. Considered temporal arteritis, trigeminal neuralgia.    Teressa Lower, NP 01/31/15 [id number]  Pricilla Loveless, MD 02/01/15 [phone]

## 2015-02-01 DIAGNOSIS — M0589 Other rheumatoid arthritis with rheumatoid factor of multiple sites: Secondary | ICD-10-CM | POA: Diagnosis not present

## 2015-02-01 NOTE — ED Notes (Signed)
Patient's daughter called and concerned about continued pain. The patient talked to and encouraged the discharged instructions. Patient daughter given education and encouraged to followup as needed.

## 2015-02-02 ENCOUNTER — Encounter (HOSPITAL_COMMUNITY): Payer: Self-pay

## 2015-02-02 ENCOUNTER — Inpatient Hospital Stay (HOSPITAL_COMMUNITY): Payer: Medicare Other

## 2015-02-02 ENCOUNTER — Telehealth: Payer: Self-pay | Admitting: Gynecology

## 2015-02-02 ENCOUNTER — Emergency Department (HOSPITAL_COMMUNITY): Payer: Medicare Other

## 2015-02-02 ENCOUNTER — Inpatient Hospital Stay (HOSPITAL_COMMUNITY)
Admission: EM | Admit: 2015-02-02 | Discharge: 2015-02-05 | DRG: 064 | Disposition: A | Discharge status: Home Health | Payer: Medicare Other | Attending: Family Medicine | Admitting: Family Medicine

## 2015-02-02 DIAGNOSIS — M069 Rheumatoid arthritis, unspecified: Secondary | ICD-10-CM | POA: Diagnosis present

## 2015-02-02 DIAGNOSIS — I6340 Cerebral infarction due to embolism of unspecified cerebral artery: Secondary | ICD-10-CM | POA: Diagnosis present

## 2015-02-02 DIAGNOSIS — I10 Essential (primary) hypertension: Secondary | ICD-10-CM | POA: Diagnosis present

## 2015-02-02 DIAGNOSIS — Z79899 Other long term (current) drug therapy: Secondary | ICD-10-CM | POA: Diagnosis not present

## 2015-02-02 DIAGNOSIS — G40909 Epilepsy, unspecified, not intractable, without status epilepticus: Secondary | ICD-10-CM | POA: Insufficient documentation

## 2015-02-02 DIAGNOSIS — G500 Trigeminal neuralgia: Secondary | ICD-10-CM | POA: Diagnosis present

## 2015-02-02 DIAGNOSIS — M1990 Unspecified osteoarthritis, unspecified site: Secondary | ICD-10-CM | POA: Diagnosis not present

## 2015-02-02 DIAGNOSIS — R519 Headache, unspecified: Secondary | ICD-10-CM | POA: Diagnosis present

## 2015-02-02 DIAGNOSIS — E559 Vitamin D deficiency, unspecified: Secondary | ICD-10-CM | POA: Diagnosis present

## 2015-02-02 DIAGNOSIS — R51 Headache: Secondary | ICD-10-CM | POA: Diagnosis not present

## 2015-02-02 DIAGNOSIS — G4089 Other seizures: Secondary | ICD-10-CM | POA: Diagnosis not present

## 2015-02-02 DIAGNOSIS — S065X0A Traumatic subdural hemorrhage without loss of consciousness, initial encounter: Secondary | ICD-10-CM | POA: Diagnosis present

## 2015-02-02 DIAGNOSIS — Z823 Family history of stroke: Secondary | ICD-10-CM | POA: Diagnosis not present

## 2015-02-02 DIAGNOSIS — Z884 Allergy status to anesthetic agent status: Secondary | ICD-10-CM | POA: Diagnosis not present

## 2015-02-02 DIAGNOSIS — Z881 Allergy status to other antibiotic agents status: Secondary | ICD-10-CM

## 2015-02-02 DIAGNOSIS — Z8673 Personal history of transient ischemic attack (TIA), and cerebral infarction without residual deficits: Secondary | ICD-10-CM

## 2015-02-02 DIAGNOSIS — Z8249 Family history of ischemic heart disease and other diseases of the circulatory system: Secondary | ICD-10-CM | POA: Diagnosis not present

## 2015-02-02 DIAGNOSIS — E039 Hypothyroidism, unspecified: Secondary | ICD-10-CM | POA: Diagnosis present

## 2015-02-02 DIAGNOSIS — Z7902 Long term (current) use of antithrombotics/antiplatelets: Secondary | ICD-10-CM

## 2015-02-02 DIAGNOSIS — Z66 Do not resuscitate: Secondary | ICD-10-CM | POA: Diagnosis present

## 2015-02-02 DIAGNOSIS — M316 Other giant cell arteritis: Secondary | ICD-10-CM | POA: Diagnosis present

## 2015-02-02 DIAGNOSIS — Z882 Allergy status to sulfonamides status: Secondary | ICD-10-CM | POA: Diagnosis not present

## 2015-02-02 DIAGNOSIS — N390 Urinary tract infection, site not specified: Secondary | ICD-10-CM | POA: Insufficient documentation

## 2015-02-02 DIAGNOSIS — W19XXXA Unspecified fall, initial encounter: Secondary | ICD-10-CM | POA: Diagnosis present

## 2015-02-02 DIAGNOSIS — I739 Peripheral vascular disease, unspecified: Secondary | ICD-10-CM | POA: Diagnosis present

## 2015-02-02 DIAGNOSIS — Z7982 Long term (current) use of aspirin: Secondary | ICD-10-CM

## 2015-02-02 DIAGNOSIS — E785 Hyperlipidemia, unspecified: Secondary | ICD-10-CM | POA: Diagnosis present

## 2015-02-02 DIAGNOSIS — I454 Nonspecific intraventricular block: Secondary | ICD-10-CM | POA: Diagnosis present

## 2015-02-02 DIAGNOSIS — I639 Cerebral infarction, unspecified: Secondary | ICD-10-CM | POA: Diagnosis present

## 2015-02-02 DIAGNOSIS — I499 Cardiac arrhythmia, unspecified: Secondary | ICD-10-CM | POA: Diagnosis present

## 2015-02-02 DIAGNOSIS — I6789 Other cerebrovascular disease: Secondary | ICD-10-CM

## 2015-02-02 DIAGNOSIS — I63432 Cerebral infarction due to embolism of left posterior cerebral artery: Secondary | ICD-10-CM | POA: Diagnosis not present

## 2015-02-02 DIAGNOSIS — R569 Unspecified convulsions: Secondary | ICD-10-CM | POA: Diagnosis not present

## 2015-02-02 HISTORY — DX: Cerebral infarction, unspecified: I63.9

## 2015-02-02 LAB — CBC WITH DIFFERENTIAL/PLATELET
Basophils Absolute: 0 10*3/uL (ref 0.0–0.1)
Basophils Relative: 0 % (ref 0–1)
Eosinophils Absolute: 0.1 10*3/uL (ref 0.0–0.7)
Eosinophils Relative: 2 % (ref 0–5)
HCT: 35.1 % — ABNORMAL LOW (ref 36.0–46.0)
Hemoglobin: 11.7 g/dL — ABNORMAL LOW (ref 12.0–15.0)
Lymphocytes Relative: 22 % (ref 12–46)
Lymphs Abs: 1.9 10*3/uL (ref 0.7–4.0)
MCH: 30.6 pg (ref 26.0–34.0)
MCHC: 33.3 g/dL (ref 30.0–36.0)
MCV: 91.9 fL (ref 78.0–100.0)
Monocytes Absolute: 1 10*3/uL (ref 0.1–1.0)
Monocytes Relative: 12 % (ref 3–12)
Neutro Abs: 5.5 10*3/uL (ref 1.7–7.7)
Neutrophils Relative %: 64 % (ref 43–77)
Platelets: 213 10*3/uL (ref 150–400)
RBC: 3.82 MIL/uL — ABNORMAL LOW (ref 3.87–5.11)
RDW: 13.9 % (ref 11.5–15.5)
WBC: 8.6 10*3/uL (ref 4.0–10.5)

## 2015-02-02 LAB — BASIC METABOLIC PANEL
Anion gap: 12 (ref 5–15)
BUN: 12 mg/dL (ref 6–20)
CO2: 23 mmol/L (ref 22–32)
Calcium: 9 mg/dL (ref 8.9–10.3)
Chloride: 104 mmol/L (ref 101–111)
Creatinine, Ser: 0.83 mg/dL (ref 0.44–1.00)
GFR calc Af Amer: >60 mL/min (ref >60)
GFR calc non Af Amer: >60 mL/min (ref >60)
Glucose, Bld: 96 mg/dL (ref 65–99)
Potassium: 3.8 mmol/L (ref 3.5–5.1)
Sodium: 139 mmol/L (ref 135–145)

## 2015-02-02 LAB — SEDIMENTATION RATE: Sed Rate: 81 mm/hr — ABNORMAL HIGH (ref 0–22)

## 2015-02-02 LAB — C-REACTIVE PROTEIN: CRP: 5.9 mg/dL — ABNORMAL HIGH (ref <1.0)

## 2015-02-02 MED ORDER — ACETAMINOPHEN 325 MG PO TABS
650.0000 mg | ORAL_TABLET | Freq: Four times a day (QID) | ORAL | Status: DC | PRN
  Administered 2015-02-02 – 2015-02-04 (×6): 650 mg via ORAL
  Filled 2015-02-02 (×6): qty 2

## 2015-02-02 MED ORDER — POLYVINYL ALCOHOL 1.4 % OP SOLN
1.0000 [drp] | OPHTHALMIC | Status: DC | PRN
  Administered 2015-02-03: 1 [drp] via OPHTHALMIC
  Filled 2015-02-02: qty 15

## 2015-02-02 MED ORDER — CARBAMAZEPINE 100 MG PO CHEW
100.0000 mg | CHEWABLE_TABLET | Freq: Three times a day (TID) | ORAL | Status: DC
  Administered 2015-02-02: 100 mg via ORAL
  Filled 2015-02-02 (×3): qty 1

## 2015-02-02 MED ORDER — PREDNISOLONE ACETATE 1 % OP SUSP
1.0000 [drp] | Freq: Three times a day (TID) | OPHTHALMIC | Status: DC
  Administered 2015-02-03 – 2015-02-05 (×7): 1 [drp] via OPHTHALMIC
  Filled 2015-02-02 (×2): qty 1

## 2015-02-02 MED ORDER — PROCHLORPERAZINE EDISYLATE 5 MG/ML IJ SOLN
5.0000 mg | Freq: Once | INTRAMUSCULAR | Status: AC
  Administered 2015-02-02: 5 mg via INTRAVENOUS
  Filled 2015-02-02: qty 2

## 2015-02-02 MED ORDER — POTASSIUM CHLORIDE CRYS ER 20 MEQ PO TBCR
20.0000 meq | EXTENDED_RELEASE_TABLET | Freq: Two times a day (BID) | ORAL | Status: DC
  Administered 2015-02-02 – 2015-02-05 (×5): 20 meq via ORAL
  Filled 2015-02-02 (×6): qty 1

## 2015-02-02 MED ORDER — SENNOSIDES-DOCUSATE SODIUM 8.6-50 MG PO TABS
1.0000 | ORAL_TABLET | Freq: Every evening | ORAL | Status: DC | PRN
Start: 2015-02-02 — End: 2015-02-05

## 2015-02-02 MED ORDER — PREDNISONE 20 MG PO TABS
40.0000 mg | ORAL_TABLET | Freq: Every day | ORAL | Status: AC
  Administered 2015-02-02 – 2015-02-04 (×3): 40 mg via ORAL
  Filled 2015-02-02 (×3): qty 2

## 2015-02-02 MED ORDER — FOLIC ACID 1 MG PO TABS
1.0000 mg | ORAL_TABLET | Freq: Every day | ORAL | Status: DC
  Administered 2015-02-02 – 2015-02-05 (×3): 1 mg via ORAL
  Filled 2015-02-02 (×3): qty 1

## 2015-02-02 MED ORDER — HEPARIN SODIUM (PORCINE) 5000 UNIT/ML IJ SOLN: 5000.0000 [IU] | Freq: Three times a day (TID) | INTRAMUSCULAR | Status: DC

## 2015-02-02 MED ORDER — FAMOTIDINE 20 MG PO TABS
20.0000 mg | ORAL_TABLET | Freq: Every day | ORAL | Status: DC
  Administered 2015-02-02 – 2015-02-05 (×3): 20 mg via ORAL
  Filled 2015-02-02 (×4): qty 1

## 2015-02-02 MED ORDER — ASPIRIN EC 81 MG PO TBEC
81.0000 mg | DELAYED_RELEASE_TABLET | Freq: Every day | ORAL | Status: DC
  Administered 2015-02-02 – 2015-02-05 (×3): 81 mg via ORAL
  Filled 2015-02-02 (×4): qty 1

## 2015-02-02 MED ORDER — STROKE: EARLY STAGES OF RECOVERY BOOK: Freq: Once | Status: DC

## 2015-02-02 MED ORDER — SODIUM CHLORIDE 0.9 % IV BOLUS (SEPSIS)
500.0000 mL | Freq: Once | INTRAVENOUS | Status: AC
  Administered 2015-02-02: 500 mL via INTRAVENOUS

## 2015-02-02 MED ORDER — ASPIRIN 81 MG PO TABS: 81.0000 mg | ORAL_TABLET | ORAL | Status: DC

## 2015-02-02 MED ORDER — LEFLUNOMIDE 20 MG PO TABS
20.0000 mg | ORAL_TABLET | ORAL | Status: DC
  Administered 2015-02-05: 20 mg via ORAL
  Filled 2015-02-02 (×2): qty 1

## 2015-02-02 MED ORDER — CLOPIDOGREL BISULFATE 75 MG PO TABS
75.0000 mg | ORAL_TABLET | Freq: Every day | ORAL | Status: DC
  Administered 2015-02-04 – 2015-02-05 (×2): 75 mg via ORAL
  Filled 2015-02-02 (×3): qty 1

## 2015-02-02 MED ORDER — DIPHENHYDRAMINE HCL 50 MG/ML IJ SOLN
12.5000 mg | Freq: Once | INTRAMUSCULAR | Status: AC
  Administered 2015-02-02: 12.5 mg via INTRAVENOUS
  Filled 2015-02-02: qty 1

## 2015-02-02 MED ORDER — LEVOTHYROXINE SODIUM 50 MCG PO TABS
75.0000 ug | ORAL_TABLET | Freq: Every day | ORAL | Status: DC
  Administered 2015-02-03 – 2015-02-05 (×3): 75 ug via ORAL
  Filled 2015-02-02 (×6): qty 1

## 2015-02-02 MED ORDER — ENOXAPARIN SODIUM 40 MG/0.4ML ~~LOC~~ SOLN: 40.0000 mg | Freq: Every day | SUBCUTANEOUS | Status: DC

## 2015-02-02 NOTE — ED Notes (Signed)
Pt. Family states that pt. Had a small slip last week before her HA started. Ground fall. States she may have hit head on door. Fell in sitting position.

## 2015-02-02 NOTE — Telephone Encounter (Signed)
On Call Note:  Daughter calls reporting mother with persistent headache x last week.  Was evaluated in ER and sent home.  Daughter taking her back now due to persistence of the headache and is afraid they will send her home again.  Instructed patient to voice her concerns to the ER staff and to contact her mothers primary physician to appraise them of the situation.

## 2015-02-02 NOTE — ED Notes (Signed)
Pt. From home with complaint of HA starting last Wednesday. Pt. States that HA is on L side of head starting at top and radiating to L ear. Pt. States pain is worse in L ear. Pt. States pain increases over time.

## 2015-02-02 NOTE — ED Notes (Signed)
Patient transported to MRI 

## 2015-02-02 NOTE — Progress Notes (Signed)
Pt arrived to 4N06 at 1806.  Pt A&O x 4, c/o 8/10 headache pain  Telemetry applied and CCMD notified. Pt V/S taken, pt is to be Q2 vitals and neuro checks until 0600.  Pt without distress.  Family at the bedside. Diet ordered, will monitor.

## 2015-02-02 NOTE — ED Provider Notes (Signed)
CSN: [id number]     Arrival date & time 02/02/15  0820 History   First MD Initiated Contact with Patient 02/02/15 [phone]     Chief Complaint  Patient presents with  . Headache     (Consider location/radiation/quality/duration/timing/severity/associated sxs/prior Treatment) HPI Patient presents to the emergency department with headache that sound going for the last week.  The patient describes a headache that is intermittent in nature and lasts for a short period of time is mainly left-sided, radiates into her ear.  Patient describes it as a sharp sensation.  She denies having any visual changes, nausea, vomiting, weakness, dizziness, neck pain, chest pain, shortness of breath, fever, lightheadedness or syncope.  Patient states nothing seems make her condition better or worse.  The patient has not taken any medications at home for her headache.  The patient was seen in the emergency department 2 nights ago.  The daughter was insistent that she come back to the emergency department for further evaluation Past Medical History  Diagnosis Date  . Abnormal EKG   . Hypertension   . Thyroid disease     HYPOTHYROIDISM  . Rheumatoid arthritis(714.0)   . Hx-TIA (transient ischemic attack)   . Chest pain     H/O  . Fatigue   . RBBB   . Aortic valve sclerosis   . Mitral valve regurgitation     TRIVIAL  . Tricuspid valve regurgitation     MILD  . Procidentia of uterus     uses pessary  . Menopausal state age 81  . Vitamin D deficiency disease   . DDD (degenerative disc disease), lumbar   . Post-menopausal bleeding 02/2011    Endo Biopsy 7/12 benign, no hyperplasia  . Arthritis    Past Surgical History  Procedure Laterality Date  . Cholecystectomy, laparoscopic  2002  . Laminectomy and microdiscectomy lumbar spine  01/23/09  . Colonoscopy w/ biopsies  6/09    sigmoid diverticuli recheck 5 years  . Vaginal delivery      x5  . Eye surgery  04/24/14   Family History  Problem Relation Age of  Onset  . Hypertension Mother   . Stroke Mother   . Hypertension Sister   . Diabetes Sister   . Hypertension Sister   . Diabetes Sister    History  Substance Use Topics  . Smoking status: Never Smoker   . Smokeless tobacco: Never Used  . Alcohol Use: No   OB History    Gravida Para Term Preterm AB TAB SAB Ectopic Multiple Living   5 5 5             Review of Systems All other systems negative except as documented in the HPI. All pertinent positives and negatives as reviewed in the HPI.   Allergies  Ciprofloxacin; Lidocaine; Neosporin; and Sulfa antibiotics  Home Medications   Prior to Admission medications   Medication Sig Start Date End Date Taking? Authorizing Provider  abatacept (ORENCIA) 250 MG injection Inject into the vein every 30 (thirty) days.    Yes Historical Provider, MD  acetaminophen (TYLENOL) 500 MG tablet Take 500 mg by mouth as needed for mild pain.    Yes Historical Provider, MD  amLODipine (NORVASC) 10 MG tablet Take 10 mg by mouth daily.   Yes Historical Provider, MD  aspirin 81 MG tablet Take 81 mg by mouth every 3 (three) days.    Yes Historical Provider, MD  clopidogrel (PLAVIX) 75 MG tablet Take 75 mg by mouth daily  with breakfast.   Yes Historical Provider, MD  Cod Liver Oil 10 MINIM CAPS Take 1 capsule by mouth daily.    Yes Historical Provider, MD  folic acid (FOLVITE) 1 MG tablet Take 1 mg by mouth daily.   Yes Historical Provider, MD  IRON PO Take 1 tablet by mouth daily.   Yes Historical Provider, MD  leflunomide (ARAVA) 20 MG tablet Take 20 mg by mouth every other day.   Yes Historical Provider, MD  levothyroxine (SYNTHROID, LEVOTHROID) 75 MCG tablet Take 75 mcg by mouth daily before breakfast.   Yes Historical Provider, MD  Polyethyl Glycol-Propyl Glycol (SYSTANE OP) Place 1 drop into both eyes 4 (four) times daily.   Yes Historical Provider, MD  potassium chloride SA (K-DUR,KLOR-CON) 20 MEQ tablet Take 20 mEq by mouth 2 (two) times daily.   Yes  Historical Provider, MD  prednisoLONE acetate (PRED FORTE) 1 % ophthalmic suspension Place 1 drop into the right eye 3 (three) times daily. 01/23/15  Yes Historical Provider, MD  PREMARIN vaginal cream USE VAGINALLY 3 TIMES WEEKLY Patient taking differently: USE VAGINALLY 3 TIMES WEEKLY ON MON, WED, FRI 09/28/14  Yes Ria Comment, FNP  valsartan-hydrochlorothiazide (DIOVAN-HCT) 160-25 MG per tablet Take 1 tablet by mouth daily.   Yes Historical Provider, MD  Vitamin D, Ergocalciferol, (DRISDOL) 50000 UNITS CAPS Take 50,000 Units by mouth every 14 (fourteen) days.   Yes Historical Provider, MD   BP 149/75 mmHg  Pulse 81  Temp(Src) 98.1 F (36.7 C) (Oral)  Resp 16  SpO2 100%  LMP 08/14/1981 Physical Exam  Constitutional: She is oriented to person, place, and time. She appears well-developed and well-nourished. No distress.  HENT:  Head: Normocephalic and atraumatic.  Mouth/Throat: Oropharynx is clear and moist.  Eyes: Pupils are equal, round, and reactive to light.  Neck: Normal range of motion. Neck supple.  Cardiovascular: Normal rate, regular rhythm and normal heart sounds.  Exam reveals no gallop and no friction rub.   No murmur heard. Pulmonary/Chest: Effort normal and breath sounds normal. No respiratory distress.  Musculoskeletal: She exhibits no edema.  Neurological: She is alert and oriented to person, place, and time. She has normal strength and normal reflexes. No cranial nerve deficit or sensory deficit. She exhibits normal muscle tone. Coordination normal. GCS eye subscore is 4. GCS verbal subscore is 5. GCS motor subscore is 6.  Skin: Skin is warm and dry. No rash noted. No erythema.  Psychiatric: She has a normal mood and affect. Her behavior is normal.  Nursing note and vitals reviewed.   ED Course  Procedures (including critical care time) Labs Review Labs Reviewed  SEDIMENTATION RATE - Abnormal; Notable for the following:    Sed Rate 81 (*)    All other components  within normal limits  CBC WITH DIFFERENTIAL/PLATELET - Abnormal; Notable for the following:    RBC 3.82 (*)    Hemoglobin 11.7 (*)    HCT 35.1 (*)    All other components within normal limits  BASIC METABOLIC PANEL    Imaging Review Ct Head Wo Contrast  01/31/2015   CLINICAL DATA:  Acute onset of left ear pain and dizziness. Initial encounter.  EXAM: CT HEAD WITHOUT CONTRAST  TECHNIQUE: Contiguous axial images were obtained from the base of the skull through the vertex without intravenous contrast.  COMPARISON:  None.  FINDINGS: There is no evidence of acute infarction, mass lesion, or intra- or extra-axial hemorrhage on CT.  Prominence of the ventricles and sulci reflects  mild to moderate cortical volume loss. Mild cerebellar atrophy is noted. Scattered periventricular and subcortical white matter change likely reflects small vessel ischemic microangiopathy.  The brainstem and fourth ventricle are within normal limits. The basal ganglia are unremarkable in appearance. The cerebral hemispheres demonstrate grossly normal gray-white differentiation. No mass effect or midline shift is seen.  There is no evidence of fracture; visualized osseous structures are unremarkable in appearance. The visualized portions of the orbits are within normal limits. The paranasal sinuses and mastoid air cells are well-aerated. No significant soft tissue abnormalities are seen.  IMPRESSION: 1. No acute intracranial pathology seen on CT. 2. Mild to moderate cortical volume loss and scattered small vessel ischemic microangiopathy. 3. The left external auditory canal and inner ear are grossly unremarkable in appearance. The left mastoid air cells are well-aerated.   Electronically Signed   By: Roanna Raider M.D.   On: 01/31/2015 22:50    I spoke with neurology about the patient who will come down and evaluate her or these headaches.  The patient had a negative head CT 2 days ago.  There is consideration for a trigeminal/skin  neuralgia type scenario along with temporal arteritis. ]  Patient be admitted the hospital for small acute infarcts    Charlestine Night, PA-C 02/02/15 [address] PA-C 02/02/15 1636  Purvis Sheffield, MD 02/02/15 2258

## 2015-02-02 NOTE — H&P (Addendum)
[facility] History and Physical  Anna Mccoy LOV:[id number] DOB: 1925-02-11 DOA: 02/02/2015  Referring physician: Mr. Charlestine Night, PA PCP: Anna Hoit, MD  Specialists:   Chief Complaint: Headache  HPI: ASHAUNTI Mccoy is a 81 y.o. female  There is a history of hypertension, hypothyroidism, history of irregular heartbeat, but presented to the emergency department with complaints of headache. Patient states her headaches have been ongoing for approximately 1 week and are intermittent, stabbing pain noted on the left side of her face down to her maxillary region and to her ear. Patient states these episodes last up to 5 minutes and then go away. She has taken Tylenol which has helped her pain. She denied any photophobia or phonophobia, nausea, vomiting. Patient denies any visual changes or decreased hearing. Patient had presented to [facility] 2 days ago for head, CT of the head was negative. Patient was sent home and told to follow-up with her primary care doctor. She presented again to the ER with complaints of headache. Neurology was consulted and recommended MRI of the brain which did show acute CVA. [facility] was called for admission.  Review of Systems:  Constitutional: Denies fever, chills, diaphoresis, appetite change and fatigue.  HEENT: Denies photophobia, eye pain, redness, hearing loss, ear pain, congestion, sore throat, rhinorrhea, sneezing, mouth sores, trouble swallowing, neck pain, neck stiffness and tinnitus.   Respiratory: Denies SOB, DOE, cough, chest tightness,  and wheezing.   Cardiovascular: Denies chest pain, palpitations and leg swelling.  Gastrointestinal: Denies nausea, vomiting, abdominal pain, diarrhea, constipation, blood in stool and abdominal distention.  Genitourinary: Denies dysuria, urgency, frequency, hematuria, flank pain and difficulty urinating.  Musculoskeletal: Denies myalgias, back pain, joint swelling, arthralgias and  gait problem.  Skin: Denies pallor, rash and wound.  Neurological: Complains of left sided headache. Hematological: Denies adenopathy. Easy bruising, personal or family bleeding history  Psychiatric/Behavioral: Denies suicidal ideation, mood changes, confusion, nervousness, sleep disturbance and agitation  Past Medical History  Diagnosis Date  . Abnormal EKG   . Hypertension   . Thyroid disease     HYPOTHYROIDISM  . Rheumatoid arthritis(714.0)   . Hx-TIA (transient ischemic attack)   . Chest pain     H/O  . Fatigue   . RBBB   . Aortic valve sclerosis   . Mitral valve regurgitation     TRIVIAL  . Tricuspid valve regurgitation     MILD  . Procidentia of uterus     uses pessary  . Menopausal state age 35  . Vitamin D deficiency disease   . DDD (degenerative disc disease), lumbar   . Post-menopausal bleeding 02/2011    Endo Biopsy 7/12 benign, no hyperplasia  . Arthritis    Past Surgical History  Procedure Laterality Date  . Cholecystectomy, laparoscopic  2002  . Laminectomy and microdiscectomy lumbar spine  01/23/09  . Colonoscopy w/ biopsies  6/09    sigmoid diverticuli recheck 5 years  . Vaginal delivery      x5  . Eye surgery  04/24/14   Social History:  reports that she has never smoked. She has never used smokeless tobacco. She reports that she does not drink alcohol or use illicit drugs.   Allergies  Allergen Reactions  . Ciprofloxacin Diarrhea  . Lidocaine   . Neosporin [Neomycin-Bacitracin Zn-Polymyx]   . Sulfa Antibiotics     Family History  Problem Relation Age of Onset  . Hypertension Mother   . Stroke Mother   . Hypertension Sister   .  Diabetes Sister   . Hypertension Sister   . Diabetes Sister     Prior to Admission medications   Medication Sig Start Date End Date Taking? Authorizing Provider  abatacept (ORENCIA) 250 MG injection Inject into the vein every 30 (thirty) days.    Yes Historical Provider, MD  acetaminophen (TYLENOL) 500 MG tablet  Take 500 mg by mouth as needed for mild pain.    Yes Historical Provider, MD  amLODipine (NORVASC) 10 MG tablet Take 10 mg by mouth daily.   Yes Historical Provider, MD  aspirin 81 MG tablet Take 81 mg by mouth every 3 (three) days.    Yes Historical Provider, MD  clopidogrel (PLAVIX) 75 MG tablet Take 75 mg by mouth daily with breakfast.   Yes Historical Provider, MD  Cod Liver Oil 10 MINIM CAPS Take 1 capsule by mouth daily.    Yes Historical Provider, MD  folic acid (FOLVITE) 1 MG tablet Take 1 mg by mouth daily.   Yes Historical Provider, MD  IRON PO Take 1 tablet by mouth daily.   Yes Historical Provider, MD  leflunomide (ARAVA) 20 MG tablet Take 20 mg by mouth every other day.   Yes Historical Provider, MD  levothyroxine (SYNTHROID, LEVOTHROID) 75 MCG tablet Take 75 mcg by mouth daily before breakfast.   Yes Historical Provider, MD  Polyethyl Glycol-Propyl Glycol (SYSTANE OP) Place 1 drop into both eyes 4 (four) times daily.   Yes Historical Provider, MD  potassium chloride SA (K-DUR,KLOR-CON) 20 MEQ tablet Take 20 mEq by mouth 2 (two) times daily.   Yes Historical Provider, MD  prednisoLONE acetate (PRED FORTE) 1 % ophthalmic suspension Place 1 drop into the right eye 3 (three) times daily. 01/23/15  Yes Historical Provider, MD  PREMARIN vaginal cream USE VAGINALLY 3 TIMES WEEKLY Patient taking differently: USE VAGINALLY 3 TIMES WEEKLY ON MON, WED, FRI 09/28/14  Yes Ria Comment, FNP  valsartan-hydrochlorothiazide (DIOVAN-HCT) 160-25 MG per tablet Take 1 tablet by mouth daily.   Yes Historical Provider, MD  Vitamin D, Ergocalciferol, (DRISDOL) 50000 UNITS CAPS Take 50,000 Units by mouth every 14 (fourteen) days.   Yes Historical Provider, MD   Physical Exam: Filed Vitals:   02/02/15 1645  BP: 149/73  Pulse: 73  Temp:   Resp:      General: Well developed, well nourished, NAD, appears stated age  HEENT: NCAT, PERRLA, EOMI, Anicteic Sclera, mucous membranes moist.   Neck: Supple,  no JVD, no masses  Cardiovascular: S1 S2 auscultated, soft murmur Regular rate and rhythm.  Respiratory: Clear to auscultation bilaterally with equal chest rise  Abdomen: Soft, nontender, nondistended, + bowel sounds  Extremities: warm dry without cyanosis clubbing or edema  Neuro: AAOx3, cranial nerves grossly intact. Strength 5/5 in patient's upper and lower extremities bilaterally  Skin: Without rashes exudates or nodules  Psych: Normal affect and demeanor with intact judgement and insight  Labs on Admission:  Basic Metabolic Panel:  Recent Labs Lab 02/02/15 1013  NA 139  K 3.8  CL 104  CO2 23  GLUCOSE 96  BUN 12  CREATININE 0.83  CALCIUM 9.0   Liver Function Tests: No results for input(s): AST, ALT, ALKPHOS, BILITOT, PROT, ALBUMIN in the last 168 hours. No results for input(s): LIPASE, AMYLASE in the last 168 hours. No results for input(s): AMMONIA in the last 168 hours. CBC:  Recent Labs Lab 02/02/15 1013  WBC 8.6  NEUTROABS 5.5  HGB 11.7*  HCT 35.1*  MCV 91.9  PLT 213  Cardiac Enzymes: No results for input(s): CKTOTAL, CKMB, CKMBINDEX, TROPONINI in the last 168 hours.  BNP (last 3 results) No results for input(s): BNP in the last 8760 hours.  ProBNP (last 3 results) No results for input(s): PROBNP in the last 8760 hours.  CBG: No results for input(s): GLUCAP in the last 168 hours.  Radiological Exams on Admission: Ct Head Wo Contrast  01/31/2015   CLINICAL DATA:  Acute onset of left ear pain and dizziness. Initial encounter.  EXAM: CT HEAD WITHOUT CONTRAST  TECHNIQUE: Contiguous axial images were obtained from the base of the skull through the vertex without intravenous contrast.  COMPARISON:  None.  FINDINGS: There is no evidence of acute infarction, mass lesion, or intra- or extra-axial hemorrhage on CT.  Prominence of the ventricles and sulci reflects mild to moderate cortical volume loss. Mild cerebellar atrophy is noted. Scattered  periventricular and subcortical white matter change likely reflects small vessel ischemic microangiopathy.  The brainstem and fourth ventricle are within normal limits. The basal ganglia are unremarkable in appearance. The cerebral hemispheres demonstrate grossly normal gray-white differentiation. No mass effect or midline shift is seen.  There is no evidence of fracture; visualized osseous structures are unremarkable in appearance. The visualized portions of the orbits are within normal limits. The paranasal sinuses and mastoid air cells are well-aerated. No significant soft tissue abnormalities are seen.  IMPRESSION: 1. No acute intracranial pathology seen on CT. 2. Mild to moderate cortical volume loss and scattered small vessel ischemic microangiopathy. 3. The left external auditory canal and inner ear are grossly unremarkable in appearance. The left mastoid air cells are well-aerated.   Electronically Signed   By: Roanna Raider M.D.   On: 01/31/2015 22:50   Mr Shirlee Latch Wo Contrast  02/02/2015   CLINICAL DATA:  Patient presents to the emergency department with headache that sound going for the last week. The patient describes a headache that is intermittent in nature and lasts for a short period of time is mainly left-sided, radiates into her ear. Patient describes it as a sharp sensation. She denies having any visual changes, nausea, vomiting, weakness, dizziness, neck pain, chest pain, shortness of breath, fever, lightheadedness or syncope. Patient states nothing seems make her condition better or worse. The patient has not taken any medications at home for her headache. The patient was seen in the emergency department 2 nights ago. The daughter was insistent that she come back to the emergency department for further evaluation. History of hypertension. History of cardiac arrhythmia  EXAM: MRI HEAD WITHOUT CONTRAST  MRA HEAD WITHOUT CONTRAST a are  TECHNIQUE: Multiplanar, multiecho pulse sequences of the  brain and surrounding structures were obtained without intravenous contrast. Angiographic images of the head were obtained using MRA technique without contrast.  COMPARISON:  CT head 01/31/15.  FINDINGS: MRI HEAD FINDINGS  Subcentimeter areas of acute infarction affect the LEFT posterior frontal and parietal cortex near the vertex.  No hemorrhage, mass lesion, hydrocephalus, or extra-axial fluid. Generalized atrophy. Moderately advanced chronic microvascular ischemic change. Flow voids are preserved. No midline abnormality. No osseous findings. Extracranial soft tissues unremarkable. Moderate retention cyst formation RIGHT maxillary sinus. Negative orbits.  Compared with prior CT, the infarcts are not visible.  MRA HEAD FINDINGS  Dolichoectatic but widely patent internal carotid arteries. Basilar artery widely patent with both vertebrals contributing. New no proximal stenosis of the anterior, middle, or posterior cerebral arteries. No MCA branch occlusion. No intracranial aneurysm.  IMPRESSION: Subcentimeter foci of restricted diffusion  in the LEFT posterior frontal and parietal cortex consistent with multiple small infarcts. These are nonhemorrhagic. See discussion above.  Atrophy and small vessel disease.  No proximal flow reducing lesion is evident.   Electronically Signed   By: Elsie Stain M.D.   On: 02/02/2015 15:54   Mr Brain Wo Contrast  02/02/2015   CLINICAL DATA:  Patient presents to the emergency department with headache that sound going for the last week. The patient describes a headache that is intermittent in nature and lasts for a short period of time is mainly left-sided, radiates into her ear. Patient describes it as a sharp sensation. She denies having any visual changes, nausea, vomiting, weakness, dizziness, neck pain, chest pain, shortness of breath, fever, lightheadedness or syncope. Patient states nothing seems make her condition better or worse. The patient has not taken any medications  at home for her headache. The patient was seen in the emergency department 2 nights ago. The daughter was insistent that she come back to the emergency department for further evaluation. History of hypertension. History of cardiac arrhythmia  EXAM: MRI HEAD WITHOUT CONTRAST  MRA HEAD WITHOUT CONTRAST a are  TECHNIQUE: Multiplanar, multiecho pulse sequences of the brain and surrounding structures were obtained without intravenous contrast. Angiographic images of the head were obtained using MRA technique without contrast.  COMPARISON:  CT head 01/31/15.  FINDINGS: MRI HEAD FINDINGS  Subcentimeter areas of acute infarction affect the LEFT posterior frontal and parietal cortex near the vertex.  No hemorrhage, mass lesion, hydrocephalus, or extra-axial fluid. Generalized atrophy. Moderately advanced chronic microvascular ischemic change. Flow voids are preserved. No midline abnormality. No osseous findings. Extracranial soft tissues unremarkable. Moderate retention cyst formation RIGHT maxillary sinus. Negative orbits.  Compared with prior CT, the infarcts are not visible.  MRA HEAD FINDINGS  Dolichoectatic but widely patent internal carotid arteries. Basilar artery widely patent with both vertebrals contributing. New no proximal stenosis of the anterior, middle, or posterior cerebral arteries. No MCA branch occlusion. No intracranial aneurysm.  IMPRESSION: Subcentimeter foci of restricted diffusion in the LEFT posterior frontal and parietal cortex consistent with multiple small infarcts. These are nonhemorrhagic. See discussion above.  Atrophy and small vessel disease.  No proximal flow reducing lesion is evident.   Electronically Signed   By: Elsie Stain M.D.   On: 02/02/2015 15:54    EKG: None  Assessment/Plan Acute CVA -CT head on 01/31/2015: No acute intracranial pathology -MRI brain: Subcentimeter foci of restricted diffusion in the left posterior frontal and parietal cortex, multiple small infacrs -Risk  factors: age, irregular heartbeat, HTN -Will order echocardiogram, carotid doppler, Hemoglobin A1c, Lipid panel -Will consult PT, OT -Neurology consulted and appreciated -Continue aspirin and plavix  Subdural hematoma -Received a call from radiology, stating patient had 63mm left subdural hematoma.  Headache -Unlikely related to acute CVA -Pending further recommendations from neurology -Headache affecting left side -Continue tylenol -Neurology recommended carbmazepine and prednisone  Hypothyroidism -Continue synthroid  Questionable history of atrial fibrillation -per patient, she has a history of "irregular heartbeat" but is only on plavix/Aspirin -Continue telemetry monitoring -will obtain echocardiogram  Hypertension -Will allow for permissive HTN -Hold amlodipine and diovan  Arthritis -Patient takes monthly orencia   DVT prophylaxis: SCDs  Code Status: DNR (Spoke with patient and daughter, daughter does not agree.)  Condition: Guarded  Family Communication: Daughter at bedside. Admission, patients condition and plan of care including tests being ordered have been discussed with the patient and daughter who indicate understanding and agree  with the plan and Code Status.  Disposition Plan: Admitted   Time spent: 65 minutes  Camdyn Laden D.O. [facility] Pager [phone]  If 7PM-7AM, please contact [facility]-coverage [email or web address] Password [facility] 02/02/2015, 4:55 PM

## 2015-02-02 NOTE — Consult Note (Addendum)
NEURO HOSPITALIST CONSULT NOTE    Reason for Consult: left facial pain  HPI:                                                                                                                                          Anna Mccoy is an 81 y.o. female with one week of intermittent stabbing/lancating pain that is located on the left maxillary region and extends to the left ear.  Episodes will last seconds to up to 4 minutes and then dissipate. She can not pinpoint top any precipitant factors. No increased lacrimation, nasal congestion, periorbital swelling, or eye pain.She denies any blurred or loss of vision, decreased hearing, tingling or loss of sensation, pain while chewing, jaw claudication, or localizing stimuli that will induce pain.  The lancating pain will be harsh enough to keep her from sleeping but not eating but she doesn't pace the floor. No photophobia, phonophobia, nausea or vomiting. CT brain 6/19 was personally reviewed and showed no acute abnormality. Past Medical History  Diagnosis Date  . Abnormal EKG   . Hypertension   . Thyroid disease     HYPOTHYROIDISM  . Rheumatoid arthritis(714.0)   . Hx-TIA (transient ischemic attack)   . Chest pain     H/O  . Fatigue   . RBBB   . Aortic valve sclerosis   . Mitral valve regurgitation     TRIVIAL  . Tricuspid valve regurgitation     MILD  . Procidentia of uterus     uses pessary  . Menopausal state age 44  . Vitamin D deficiency disease   . DDD (degenerative disc disease), lumbar   . Post-menopausal bleeding 02/2011    Endo Biopsy 7/12 benign, no hyperplasia  . Arthritis     Past Surgical History  Procedure Laterality Date  . Cholecystectomy, laparoscopic  2002  . Laminectomy and microdiscectomy lumbar spine  01/23/09  . Colonoscopy w/ biopsies  6/09    sigmoid diverticuli recheck 5 years  . Vaginal delivery      x5  . Eye surgery  04/24/14    Family History  Problem Relation Age of Onset   . Hypertension Mother   . Stroke Mother   . Hypertension Sister   . Diabetes Sister   . Hypertension Sister   . Diabetes Sister      Social History:  reports that she has never smoked. She has never used smokeless tobacco. She reports that she does not drink alcohol or use illicit drugs.  Family history: no brain tumors, brain aneurysms, or epilepsy.  Allergies  Allergen Reactions  . Ciprofloxacin Diarrhea  . Lidocaine   . Neosporin [Neomycin-Bacitracin Zn-Polymyx]   . Sulfa Antibiotics     MEDICATIONS:  No current facility-administered medications for this encounter.   Current Outpatient Prescriptions  Medication Sig Dispense Refill  . abatacept (ORENCIA) 250 MG injection Inject into the vein every 30 (thirty) days.     Marland Kitchen acetaminophen (TYLENOL) 500 MG tablet Take 500 mg by mouth as needed for mild pain.     Marland Kitchen amLODipine (NORVASC) 10 MG tablet Take 10 mg by mouth daily.    Marland Kitchen aspirin 81 MG tablet Take 81 mg by mouth every 3 (three) days.     . clopidogrel (PLAVIX) 75 MG tablet Take 75 mg by mouth daily with breakfast.    . Cod Liver Oil 10 MINIM CAPS Take 1 capsule by mouth daily.     . folic acid (FOLVITE) 1 MG tablet Take 1 mg by mouth daily.    . IRON PO Take 1 tablet by mouth daily.    Marland Kitchen leflunomide (ARAVA) 20 MG tablet Take 20 mg by mouth every other day.    . levothyroxine (SYNTHROID, LEVOTHROID) 75 MCG tablet Take 75 mcg by mouth daily before breakfast.    . Polyethyl Glycol-Propyl Glycol (SYSTANE OP) Place 1 drop into both eyes 4 (four) times daily.    . potassium chloride SA (K-DUR,KLOR-CON) 20 MEQ tablet Take 20 mEq by mouth 2 (two) times daily.    . prednisoLONE acetate (PRED FORTE) 1 % ophthalmic suspension Place 1 drop into the right eye 3 (three) times daily.  1  . PREMARIN vaginal cream USE VAGINALLY 3 TIMES WEEKLY (Patient taking differently: USE  VAGINALLY 3 TIMES WEEKLY ON MON, WED, FRI) 42.5 g 11  . valsartan-hydrochlorothiazide (DIOVAN-HCT) 160-25 MG per tablet Take 1 tablet by mouth daily.    . Vitamin D, Ergocalciferol, (DRISDOL) 50000 UNITS CAPS Take 50,000 Units by mouth every 14 (fourteen) days.        ROS:                                                                                                                                       History obtained from the patient  General ROS: negative for - chills, fatigue, fever, night sweats, weight gain or weight loss Psychological ROS: negative for - behavioral disorder, hallucinations, memory difficulties, mood swings or suicidal ideation Ophthalmic ROS: negative for - blurry vision, double vision, eye pain or loss of vision ENT ROS: negative for - epistaxis, nasal discharge, oral lesions, sore throat, tinnitus or vertigo Allergy and Immunology ROS: negative for - hives or itchy/watery eyes Hematological and Lymphatic ROS: negative for - bleeding problems, bruising or swollen lymph nodes Endocrine ROS: negative for - galactorrhea, hair pattern changes, polydipsia/polyuria or temperature intolerance Respiratory ROS: negative for - cough, hemoptysis, shortness of breath or wheezing Cardiovascular ROS: negative for - chest pain, dyspnea on exertion, edema or irregular heartbeat Gastrointestinal ROS: negative for - abdominal pain, diarrhea, hematemesis, nausea/vomiting or stool incontinence Genito-Urinary ROS: negative for - dysuria, hematuria, incontinence  or urinary frequency/urgency Musculoskeletal ROS: negative for - joint swelling or muscular weakness Neurological ROS: as noted in HPI Dermatological ROS: negative for rash and skin lesion changes   Blood pressure 149/75, pulse 81, temperature 98.1 F (36.7 C), temperature source Oral, resp. rate 16, last menstrual period 08/14/1981, SpO2 100 %.   Physical Examination:                                                                                                       HEENT-  Normocephalic, no lesions, without obvious abnormality.  Normal external eye and conjunctiva.  Normal TM's bilaterally.  Normal auditory canals and external ears. Normal external nose, mucus membranes and septum.  Normal pharynx. Cardiovascular- S1, S2 normal, pulses palpable throughout   Lungs- chest clear, no wheezing, rales, normal symmetric air entry Abdomen- normal findings: bowel sounds normal Extremities- no edema Lymph-no adenopathy palpable Musculoskeletal-no joint tenderness, deformity or swelling Skin-warm and dry, no hyperpigmentation, vitiligo, or suspicious lesions  Neurological Examination Mental Status: Alert, oriented, thought content appropriate.  Speech fluent without evidence of aphasia.  Able to follow 3 step commands without difficulty. Cranial Nerves: II: Discs flat bilaterally; Visual fields grossly normal, pupils equal, round, reactive to light and accommodation III,IV, VI: ptosis not present, extra-ocular motions intact bilaterally V,VII: smile symmetric, facial light touch sensation normal bilaterally VIII: hearing normal bilaterally IX,X: uvula rises symmetrically XI: bilateral shoulder shrug XII: midline tongue extension Motor: Right : Upper extremity   5/5    Left:     Upper extremity   5/5  Lower extremity   5/5     Lower extremity   5/5 Tone and bulk:normal tone throughout; no atrophy noted Sensory: Pinprick and light touch intact throughout, bilaterally Deep Tendon Reflexes: 2+ and symmetric throughout Plantars: Right: downgoing   Left: downgoing Cerebellar: normal finger-to-nose and normal heel-to-shin test Gait: not tested.    Lab Results: Basic Metabolic Panel:  Recent Labs Lab 02/02/15 1013  NA 139  K 3.8  CL 104  CO2 23  GLUCOSE 96  BUN 12  CREATININE 0.83  CALCIUM 9.0    Liver Function Tests: No results for input(s): AST, ALT, ALKPHOS, BILITOT, PROT, ALBUMIN in the last 168  hours. No results for input(s): LIPASE, AMYLASE in the last 168 hours. No results for input(s): AMMONIA in the last 168 hours.  CBC:  Recent Labs Lab 02/02/15 1013  WBC 8.6  NEUTROABS 5.5  HGB 11.7*  HCT 35.1*  MCV 91.9  PLT 213    Cardiac Enzymes: No results for input(s): CKTOTAL, CKMB, CKMBINDEX, TROPONINI in the last 168 hours.  Lipid Panel: No results for input(s): CHOL, TRIG, HDL, CHOLHDL, VLDL, LDLCALC in the last 168 hours.  CBG: No results for input(s): GLUCAP in the last 168 hours.  Microbiology: No results found for this or any previous visit.  Coagulation Studies: No results for input(s): LABPROT, INR in the last 72 hours.  Imaging: Ct Head Wo Contrast  01/31/2015   CLINICAL DATA:  Acute onset of left ear pain and dizziness. Initial encounter.  EXAM: CT  HEAD WITHOUT CONTRAST  TECHNIQUE: Contiguous axial images were obtained from the base of the skull through the vertex without intravenous contrast.  COMPARISON:  None.  FINDINGS: There is no evidence of acute infarction, mass lesion, or intra- or extra-axial hemorrhage on CT.  Prominence of the ventricles and sulci reflects mild to moderate cortical volume loss. Mild cerebellar atrophy is noted. Scattered periventricular and subcortical white matter change likely reflects small vessel ischemic microangiopathy.  The brainstem and fourth ventricle are within normal limits. The basal ganglia are unremarkable in appearance. The cerebral hemispheres demonstrate grossly normal gray-white differentiation. No mass effect or midline shift is seen.  There is no evidence of fracture; visualized osseous structures are unremarkable in appearance. The visualized portions of the orbits are within normal limits. The paranasal sinuses and mastoid air cells are well-aerated. No significant soft tissue abnormalities are seen.  IMPRESSION: 1. No acute intracranial pathology seen on CT. 2. Mild to moderate cortical volume loss and scattered  small vessel ischemic microangiopathy. 3. The left external auditory canal and inner ear are grossly unremarkable in appearance. The left mastoid air cells are well-aerated.   Electronically Signed   By: Roanna Raider M.D.   On: 01/31/2015 22:50   Assessment and plan per attending neurologist  Felicie Morn PA-C [facility] [phone]  02/02/2015, 1:50 PM   Assessment/Plan: 81 YO female with one week of daily, recurrent intermittent lancinating pain located over the left zygomatic arch into the left ear. No head imaging has been obtained thus far. From description, likely trigeminal neuralgia. Giant cell arteritis also in the differential although seems to be less likely, but will order sed rate and C-reactive protein. Further, will order MRI/MRA brain to evaluate for intracranial process prior to starting a trial of carbamazepine.  Addendum: MRI brain demonstrated subcentimeter areas of acute infarction affecting the LEFT posterior frontal and parietal cortex near the vertex. Recommended admission to the hospital to complete stroke work up and also to try to get her pain under control with carbamazepine and also prednisone 40 mg daily with gut protection.  Wyatt Portela, MD

## 2015-02-03 ENCOUNTER — Inpatient Hospital Stay (HOSPITAL_COMMUNITY): Payer: Medicare Other

## 2015-02-03 ENCOUNTER — Encounter (HOSPITAL_COMMUNITY): Payer: PRIVATE HEALTH INSURANCE

## 2015-02-03 ENCOUNTER — Inpatient Hospital Stay (HOSPITAL_COMMUNITY): Payer: PRIVATE HEALTH INSURANCE

## 2015-02-03 DIAGNOSIS — I10 Essential (primary) hypertension: Secondary | ICD-10-CM

## 2015-02-03 DIAGNOSIS — R569 Unspecified convulsions: Secondary | ICD-10-CM

## 2015-02-03 DIAGNOSIS — G40909 Epilepsy, unspecified, not intractable, without status epilepticus: Secondary | ICD-10-CM | POA: Insufficient documentation

## 2015-02-03 DIAGNOSIS — I63432 Cerebral infarction due to embolism of left posterior cerebral artery: Secondary | ICD-10-CM

## 2015-02-03 DIAGNOSIS — I6789 Other cerebrovascular disease: Secondary | ICD-10-CM

## 2015-02-03 DIAGNOSIS — I639 Cerebral infarction, unspecified: Secondary | ICD-10-CM

## 2015-02-03 LAB — BASIC METABOLIC PANEL
Anion gap: 9 (ref 5–15)
BUN: 14 mg/dL (ref 6–20)
CO2: 25 mmol/L (ref 22–32)
Calcium: 9 mg/dL (ref 8.9–10.3)
Chloride: 106 mmol/L (ref 101–111)
Creatinine, Ser: 1.01 mg/dL — ABNORMAL HIGH (ref 0.44–1.00)
GFR calc Af Amer: 56 mL/min — ABNORMAL LOW (ref >60)
GFR, EST NON AFRICAN AMERICAN: 49 mL/min — AB (ref >60)
Glucose, Bld: 190 mg/dL — ABNORMAL HIGH (ref 65–99)
POTASSIUM: 4.3 mmol/L (ref 3.5–5.1)
SODIUM: 140 mmol/L (ref 135–145)

## 2015-02-03 LAB — CBC
HCT: 33.8 % — ABNORMAL LOW (ref 36.0–46.0)
HEMOGLOBIN: 11.3 g/dL — AB (ref 12.0–15.0)
MCH: 30.5 pg (ref 26.0–34.0)
MCHC: 33.4 g/dL (ref 30.0–36.0)
MCV: 91.4 fL (ref 78.0–100.0)
PLATELETS: 247 10*3/uL (ref 150–400)
RBC: 3.7 MIL/uL — ABNORMAL LOW (ref 3.87–5.11)
RDW: 13.6 % (ref 11.5–15.5)
WBC: 6 10*3/uL (ref 4.0–10.5)

## 2015-02-03 LAB — LIPID PANEL
Cholesterol: 197 mg/dL (ref 0–200)
HDL: 65 mg/dL (ref >40)
LDL Cholesterol: 120 mg/dL — ABNORMAL HIGH (ref 0–99)
TRIGLYCERIDES: 61 mg/dL (ref <150)
Total CHOL/HDL Ratio: 3 RATIO
VLDL: 12 mg/dL (ref 0–40)

## 2015-02-03 LAB — HEMOGLOBIN A1C
HEMOGLOBIN A1C: 5.3 % (ref 4.8–5.6)
MEAN PLASMA GLUCOSE: 105 mg/dL

## 2015-02-03 LAB — MRSA PCR SCREENING: MRSA BY PCR: NEGATIVE

## 2015-02-03 MED ORDER — VALPROATE SODIUM 500 MG/5ML IV SOLN
250.0000 mg | Freq: Two times a day (BID) | INTRAVENOUS | Status: DC
  Administered 2015-02-03: 250 mg via INTRAVENOUS
  Filled 2015-02-03 (×3): qty 2.5

## 2015-02-03 MED ORDER — SODIUM CHLORIDE 0.9 % IV SOLN: 500.0000 mg | Freq: Once | INTRAVENOUS | Status: DC

## 2015-02-03 MED ORDER — SODIUM CHLORIDE 0.9 % IV SOLN: INTRAVENOUS | Status: DC

## 2015-02-03 MED ORDER — VALPROATE SODIUM 500 MG/5ML IV SOLN
750.0000 mg | Freq: Once | INTRAVENOUS | Status: AC
  Administered 2015-02-03: 750 mg via INTRAVENOUS
  Filled 2015-02-03: qty 7.5

## 2015-02-03 MED ORDER — ATORVASTATIN CALCIUM 10 MG PO TABS
10.0000 mg | ORAL_TABLET | Freq: Every day | ORAL | Status: DC
  Administered 2015-02-04: 10 mg via ORAL
  Filled 2015-02-03 (×2): qty 1

## 2015-02-03 MED ORDER — GADOBENATE DIMEGLUMINE 529 MG/ML IV SOLN
10.0000 mL | Freq: Once | INTRAVENOUS | Status: AC | PRN
Start: 2015-02-03 — End: 2015-02-03
  Administered 2015-02-03: 10 mL via INTRAVENOUS

## 2015-02-03 MED ORDER — LORAZEPAM 2 MG/ML IJ SOLN
2.0000 mg | Freq: Once | INTRAMUSCULAR | Status: DC
  Filled 2015-02-03: qty 1

## 2015-02-03 MED ORDER — SODIUM CHLORIDE 0.9 % IV SOLN: 250.0000 mg | Freq: Two times a day (BID) | INTRAVENOUS | Status: DC

## 2015-02-03 NOTE — Progress Notes (Signed)
  Echocardiogram 2D Echocardiogram has been performed.  Anna Mccoy 02/03/2015, 4:58 PM

## 2015-02-03 NOTE — Progress Notes (Signed)
PT Cancellation Note  Patient Details Name: NURIYAH HANLINE MRN: [id number] DOB: 1925/07/17   Cancelled Treatment:    Reason Eval/Treat Not Completed: Medical issues which prohibited therapy, Per RN, pt experiencing seizures this am. Will hold evaluation and f/u when appropriate.   Fabio Asa 02/03/2015, 11:12 AM Charlotte Crumb, PT DPT  [phone]

## 2015-02-03 NOTE — Progress Notes (Deleted)
Echo tech called out reporting seizure activity, upon assessment, pt is having active and rhythmic jerking movements to the right side, R eye was twitching rhythmically, no movement of the L eye.  She had a constant R gaze throughout the episode, could not follow commands, pupils 4 bilaterally and non-reactive.  Pt was very rigid.  Rapid Response, attending, and neuro were notified.  Pt now resting and following all commands, pupils now reacting to light, and pt is still nonverbal at this time.  RN will monitor.

## 2015-02-03 NOTE — Progress Notes (Signed)
EEG Completed; Results Pending  

## 2015-02-03 NOTE — Progress Notes (Signed)
Pt now able to communicate, is alert to self and family only.

## 2015-02-03 NOTE — Progress Notes (Signed)
Patient with seizure activity x 2. Ativan given IV.  MD aware. Plan transfer to SDU.

## 2015-02-03 NOTE — Care Management Note (Signed)
Case Management Note  Patient Details  Name: Anna Mccoy MRN: [id number] Date of Birth: Jun 07, 1925  Subjective/Objective:       Adm w stroke             Action/Plan: lives at home   Expected Discharge Date:                  Expected Discharge Plan:     In-House Referral:     Discharge planning Services     Post Acute Care Choice:    Choice offered to:     DME Arranged:    DME Agency:     HH Arranged:    HH Agency:     Status of Service:     Medicare Important Message Given:    Date Medicare IM Given:    Medicare IM give by:    Date Additional Medicare IM Given:    Additional Medicare Important Message give by:     If discussed at Long Length of Stay Meetings, dates discussed:    Additional Comments: ur review done  Hanley Hays, RN 02/03/2015, 11:17 AM

## 2015-02-03 NOTE — Procedures (Signed)
ELECTROENCEPHALOGRAM REPORT   Patient: Anna Mccoy       Room #: 9S85 EEG No. ID: [id number] Age: 81 y.o.        Sex: female Referring Physician: Catha Gosselin Report Date:  02/03/2015        Interpreting Physician: Thana Farr  History: [name] is an 81 y.o. female acute infarcts and left facial pain  Medications:  Scheduled: .  stroke: mapping our early stages of recovery book   Does not apply Once  . aspirin EC  81 mg Oral Daily  . atorvastatin  10 mg Oral q1800  . clopidogrel  75 mg Oral Q breakfast  . famotidine  20 mg Oral Daily  . folic acid  1 mg Oral Daily  . leflunomide  20 mg Oral QODAY  . levothyroxine  75 mcg Oral QAC breakfast  . LORazepam  2 mg Intravenous Once  . potassium chloride SA  20 mEq Oral BID  . prednisoLONE acetate  1 drop Right Eye TID  . predniSONE  40 mg Oral QAC breakfast  . valproate sodium  250 mg Intravenous Q12H    Conditions of Recording:  This is a 16 channel EEG carried out with the patient in the awake, drowsy and asleep states.  Description:  The patient is drowsy and asleep for the majority of the recording.  During drowse the background activity is slow with a mixture of theta and delta rhythms noted.    The background activity is continuous.  Theta rhythms are most prominent over the right hemisphere but over the left hemisphere is noted intermittent polymorphic delta activity that further slows the background, and is particularly prominent in the left temporal region.  There are also rare sharp transients noted over the left hemisphere with phase reversal at T3.   Although present for less of the recording some stage II sleep is noted as well with symmetrical sleep spindles, vertex central sharp transients and irregular slow activity.  There are attempts to awaken the patient and although she does alert with increase in frequency of the background rhythm noted, she is unable to maintain wakefulness and therefore wakefulness can  not be fully evaluated.   Hyperventilation and intermittent photic stimulation were not performed.   IMPRESSION: This is an abnormal electroencephalogram secondary to left temporal slowing and rare left temporal sharp transients.  This is consistent with the patient's history of left hemispheric acute infarcts seen on MRI.     Thana Farr, MD [facility] [phone] 02/03/2015, 6:40 PM

## 2015-02-03 NOTE — Progress Notes (Signed)
Pt having another episode of seizure activity lasting 3 1/2 minutes.  Episode started with right arm jerking/twitching, right leg jerking, left leg muscle twitching.  2mg  ativan given.  Pt pupils reactive to light.  Pt nonverbal at this time, minimally following commands.  Attending and neuro were notified.  Pt now resting.  Vital signs: 176/90, 116, 28, 100% on 2L nasal cannula.  Will continue to monitor.  , RN

## 2015-02-03 NOTE — Progress Notes (Signed)
STROKE TEAM PROGRESS NOTE   HISTORY Anna Mccoy is an 81 y.o. female with one week of intermittent stabbing/lancating pain that is located on the left maxillary region and extends to the left ear. Episodes will last seconds to up to 4 minutes and then dissipate. She can not pinpoint top any precipitant factors. No increased lacrimation, nasal congestion, periorbital swelling, or eye pain.She denies any blurred or loss of vision, decreased hearing, tingling or loss of sensation, pain while chewing, jaw claudication, or localizing stimuli that will induce pain. The lancating pain will be harsh enough to keep her from sleeping but not eating but she doesn't pace the floor. No photophobia, phonophobia, nausea or vomiting. CT brain 6/19 was reviewed and showed no acute abnormality.   SUBJECTIVE (INTERVAL HISTORY) Her 2 daughters is at the bedside.  Overall she feels her condition is stable now. No further seizures - had had 2 this am and transferred to the heart unit. Resolved after ativan. Daughters report she is still struggling to speak.   OBJECTIVE Temp:  [97.4 F (36.3 C)-98.7 F (37.1 C)] 97.8 F (36.6 C) (06/22 0631) Pulse Rate:  [64-88] 64 (06/22 0631) Cardiac Rhythm:  [-] Normal sinus rhythm;Bundle branch block (06/22 0800) Resp:  [16-23] 18 (06/22 0631) BP: (104-157)/(53-84) 131/68 mmHg (06/22 0631) SpO2:  [93 %-100 %] 100 % (06/22 0631)  No results for input(s): GLUCAP in the last 168 hours.  Recent Labs Lab 02/02/15 1013  NA 139  K 3.8  CL 104  CO2 23  GLUCOSE 96  BUN 12  CREATININE 0.83  CALCIUM 9.0   No results for input(s): AST, ALT, ALKPHOS, BILITOT, PROT, ALBUMIN in the last 168 hours.  Recent Labs Lab 02/02/15 1013  WBC 8.6  NEUTROABS 5.5  HGB 11.7*  HCT 35.1*  MCV 91.9  PLT 213   No results for input(s): CKTOTAL, CKMB, CKMBINDEX, TROPONINI in the last 168 hours. No results for input(s): LABPROT, INR in the last 72 hours. No results for input(s):  COLORURINE, LABSPEC, PHURINE, GLUCOSEU, HGBUR, BILIRUBINUR, KETONESUR, PROTEINUR, UROBILINOGEN, NITRITE, LEUKOCYTESUR in the last 72 hours.  Invalid input(s): APPERANCEUR     Component Value Date/Time   CHOL 197 02/03/2015 0705   TRIG 61 02/03/2015 0705   HDL 65 02/03/2015 0705   CHOLHDL 3.0 02/03/2015 0705   VLDL 12 02/03/2015 0705   LDLCALC 120* 02/03/2015 0705   Lab Results  Component Value Date   HGBA1C 5.3 02/02/2015   No results found for: LABOPIA, COCAINSCRNUR, LABBENZ, AMPHETMU, THCU, LABBARB  No results for input(s): ETH in the last 168 hours.  Dg Chest 2 View 02/02/2015   No evidence of acute cardiopulmonary disease.    Mr Brain Wo Contrast 02/02/2015    In the LEFT occipital region, there is a 3 mm thick isointense fluid collection on FLAIR imaging, trace susceptibility on gradient sequence, not visible on CT, suggesting a small subacute subdural hematoma or hygroma. This is remote from the small acute infarcts. No history of trauma or anticoagulation. Significance uncertain as the patient's headache is reportedly maxillary. Subcentimeter foci of restricted diffusion in the LEFT posterior frontal and parietal cortex consistent with multiple small infarcts. These are nonhemorrhagic. Atrophy and small vessel disease.    Mr Maxine Glenn Head Wo Contrast 02/02/2015   No proximal flow reducing lesion is evident.    EEG pending    PHYSICAL EXAM Frail elderly  lady not in distress. . Afebrile. Head is nontraumatic. Neck is supple without bruit.  Cardiac exam no murmur or gallop. Lungs are clear to auscultation. Distal pulses are well felt. Neurological Exam : Awake alert disoriented. Diminished attention, registration and recall. Speech is nonfluent with significant word hesitancy, expressive greater than receptive difficulties. Follows simple midline and on step commands. Extraocular movements are full range without nystagmus. Blinks to threat bilaterally. Fundi were not visualized. Vision  acuity seems adequate. Face is symmetric without weakness. Tongue is midline. Motor system exam revealed no upper or lower extremity drift. No focal weakness. Reflexes are 2+ symmetric. Plantars are downgoing. Gait was not tested. Sensation appears intact bilaterally. ASSESSMENT/PLAN Ms. Anna Mccoy is a 81 y.o. female with history of hypertension, hypothyroidism and irregular heartbeat presenting with headache. Subsequent MRI shows small left occipital subdural hemorrhage, left posterior frontal and parietal multiple small embolic infarcts  She did not receive IV t-PA due to stroke not recognized on admission, nonfocal.   Neurologic Headache Left occipital "collection" subdural hemorrhage secondary to fall 1 week prior vs meningial thickening secondary to RA Stroke:  Incidental left posterior frontal and parietal multiple small infarcts secondary to small vessel disease versus rheumatoid vasculitis  Resultant  Left temporal neuralgic Headache  MRI  Left posterior frontal and parietal multiple small infarcts  MRA  Unremarkable  MRI with contrast to evaluate for hemorrhage vs meningeal thickening  Carotid Doppler  pending   2D Echo  pending   HgbA1c 5.3  SCDs for VTE prophylaxis  Diet Heart Room service appropriate?: Yes; Fluid consistency:: Thin  aspirin 81 mg orally every day and clopidogrel 75 mg orally every day prior to admission, now on aspirin 81 mg orally every day  Therapy recommendations:  pending   Disposition:  pending   Seizure  started R hand, x 10 mins  Secondary episode included R hand and R leg  Resolved with ativan 2 mg  Transferred to 2H  Resultant expressive aphasia  New tegretol for headache started, will discontinue as trigeminal neuralgia not suspected.  (no vision loss or  jaw pain, but ear pain)  Add depacon 750 mg IV now followed by 250 mg bid  Questionable history of atrial fibrillation  Irregular heartbeat per patient  Not on  anticoagulation prior to admission  Telemetry monitoring/EKG underway  Hypertension  stable  Hyperlipidemia  Home meds:  No statin  LDL 120, goal < 70  Add statin   Continue statin at discharge  Other Stroke Risk Factors  Advanced age  Hx TIA  Family hx stroke (mother)  Other Active Problems  Rheumatoid arthritis  hypothyroidism  Other Pertinent History  Recent cataract surgery in R eye in May 2016  Hospital day # 1  [facility] See Amion for Pager information 02/03/2015 12:46 PM  I have personally examined this patient, reviewed notes, independently viewed imaging studies, participated in medical decision making and plan of care. I have made any additions or clarifications directly to the above note. Agree with note above. She presented with new onset neurologic headache but MRI scan shows 2 tiny punctate left posterior frontal and parietal infarcts etiology to be determined but this morning she had 2 focal seizures followed normal by postictal confusion and speech difficulties. MRI scan also shows subtle left parietal subdural collection/thickening which needs further evaluation. She remains at risk for neurological worsening, strokes, TIAs, seizures. Recommend Depakote for headache and seizures and discontinue carbamazepine. Check MRI scan of the brain with contrast to look for meningeal thickening/granuloma.and EEG Discussed with patient and daughter  at the bedside and answered questions  Delia Heady, MD Medical Director [facility] Pager: [phone] 02/03/2015 2:31 PM    To contact Stroke Continuity provider, please refer to [email or web address]. After hours, contact General Neurology

## 2015-02-03 NOTE — Progress Notes (Signed)
Pt transferred to stepdown via bed.  Glasses given to the patient's daughter.  All other belongings sent with the patent. Sondra Come, RN

## 2015-02-03 NOTE — Progress Notes (Addendum)
[facility]                                                                              Patient Demographics  Anna Mccoy, is a 81 y.o. female, DOB - [birth year], YBO:[id number]  Admit date - 02/02/2015   Admitting Physician Cristal Ford, DO  Outpatient Primary MD for the patient is Woody Seller, MD  LOS - 1   Chief Complaint  Patient presents with  . Headache      HPI on 02/02/2015 Anna Mccoy is a 81 y.o. female with a history of hypertension, hypothyroidism, history of irregular heartbeat, but presented to the emergency department with complaints of headache. Patient states her headaches have been ongoing for approximately 1 week and are intermittent, stabbing pain noted on the left side of her face down to her maxillary region and to her ear. Patient states these episodes last up to 5 minutes and then go away. She has taken Tylenol which has helped her pain. She denied any photophobia or phonophobia, nausea, vomiting. Patient denies any visual changes or decreased hearing. Patient had presented to [facility] 2 days ago for head, CT of the head was negative. Patient was sent home and told to follow-up with her primary care doctor. She presented again to the ER with complaints of headache. Neurology was consulted and recommended MRI of the brain which did show acute CVA. [facility] was called for admission.  Assessment & Plan  New onset Seizure -Patient had 2 witnessed seizures.  Ativan $Remov'2mg'WsyqKL$  ordered and given.  -Currently maintaining her airway (Patient is a DNR) -Will transfer to step down -Stat EEG ordered, neurology notified -CXR negative for infection -Will obtain UA and blood cultures -Currently afebrile, no leukocytosis  Acute CVA -CT head on 01/31/2015: No acute intracranial pathology -MRI brain: Subcentimeter foci of restricted diffusion in the left posterior frontal and parietal cortex, multiple small infacrs -Risk factors: age, irregular  heartbeat, HTN -Pending echocardiogram, carotid doppler -Hemoglobin A1c 5.3, Lipid panel 120 -PT/OT consulted for eval -Neurology consulted and appreciated -Continue aspirin and plavix  Subdural hematoma -Received a call from radiology, stating patient had 63mm left subdural hematoma.  Headache -Unlikely related to acute CVA -Pending further recommendations from neurology- thought to be trigeminal neuralgia vs temporal arteritis -Headache affecting left side -Continue tylenol -Neurology recommended carbmazepine and prednisone. Spoke with Dr. Aram Beecham this morning, patient may need temporal artery biopsy. -CRP 5.9, ESR 81  Hypothyroidism -Continue synthroid  Questionable history of atrial fibrillation -per patient, she has a history of "irregular heartbeat" but is only on plavix/Aspirin -Continue telemetry monitoring-shows SR -echocardiogram pending  Hypertension -Will allow for permissive HTN -Hold amlodipine and diovan  Arthritis -Patient takes monthly orencia  Code Status: DNR (Spoke with patient and daughter, daughter does not agree  Family Communication: Daughter at bedside  Disposition Plan: Admitted, pending further workup for stroke/seizure.  Will transfer to Step down.  Time Spent in minutes   45 minutes  Procedures  EEG  Consults   Neurology  DVT Prophylaxis  SCDs  Lab Results  Component Value Date   PLT 247 02/03/2015    Medications  Scheduled Meds: .  stroke: mapping  our early stages of recovery book   Does not apply Once  . aspirin EC  81 mg Oral Daily  . clopidogrel  75 mg Oral Q breakfast  . famotidine  20 mg Oral Daily  . folic acid  1 mg Oral Daily  . leflunomide  20 mg Oral QODAY  . levothyroxine  75 mcg Oral QAC breakfast  . LORazepam  2 mg Intravenous Once  . potassium chloride SA  20 mEq Oral BID  . prednisoLONE acetate  1 drop Right Eye TID  . predniSONE  40 mg Oral QAC breakfast  . valproate sodium  750 mg Intravenous Once    Followed by  . valproate sodium  250 mg Intravenous Q12H   Continuous Infusions:  PRN Meds:.acetaminophen, polyvinyl alcohol, senna-docusate  Antibiotics    Anti-infectives    None      Subjective:   Anna Mccoy seen and examined today.  Patient continues to have headache.  Denies chest pain, shortness of breath, dizziness, abdominal pain.    Objective:   Filed Vitals:   02/03/15 0905 02/03/15 1010 02/03/15 1045 02/03/15 1100  BP: 166/80 114/70 140/67 121/93  Pulse: 143 92 91 87  Temp:  98.6 F (37 C)    TempSrc:  Axillary    Resp:  $Remo'20 23 20  'kVzmh$ Height:   '5\' 4"'$  (1.626 m)   Weight:   50.803 kg (112 lb)   SpO2: 100% 100% 100% 100%    Wt Readings from Last 3 Encounters:  02/03/15 50.803 kg (112 lb)  01/31/15 53.071 kg (117 lb)  12/24/14 52.345 kg (115 lb 6.4 oz)     Intake/Output Summary (Last 24 hours) at 02/03/15 1254 Last data filed at 02/03/15 1211  Gross per 24 hour  Intake      0 ml  Output    100 ml  Net   -100 ml    Exam  General: Well developed, well nourished, no distress  HEENT: NCAT, mucous membranes moist.   Cardiovascular: S1 S2 auscultated, +murmur, RRR  Respiratory: Clear to auscultation bilaterally with equal chest rise  Abdomen: Soft, nontender, nondistended, + bowel sounds  Extremities: warm dry without cyanosis clubbing or edema  Neuro: AAOx3, nonfocacl  Psych: Normal affect and demeanor, pleasant  Data Review   Micro Results No results found for this or any previous visit (from the past 240 hour(s)).  Radiology Reports Dg Chest 2 View  02/02/2015   CLINICAL DATA:  Acute CVA  EXAM: CHEST  2 VIEW  COMPARISON:  01/14/2009  FINDINGS: Lungs are clear.  No pleural effusion or pneumothorax.  The heart is normal in size.  Mild degenerative changes of the visualized thoracolumbar spine.  IMPRESSION: No evidence of acute cardiopulmonary disease.   Electronically Signed   By: Julian Hy M.D.   On: 02/02/2015 19:02   Ct Head Wo  Contrast  01/31/2015   CLINICAL DATA:  Acute onset of left ear pain and dizziness. Initial encounter.  EXAM: CT HEAD WITHOUT CONTRAST  TECHNIQUE: Contiguous axial images were obtained from the base of the skull through the vertex without intravenous contrast.  COMPARISON:  None.  FINDINGS: There is no evidence of acute infarction, mass lesion, or intra- or extra-axial hemorrhage on CT.  Prominence of the ventricles and sulci reflects mild to moderate cortical volume loss. Mild cerebellar atrophy is noted. Scattered periventricular and subcortical white matter change likely reflects small vessel ischemic microangiopathy.  The brainstem and fourth ventricle are within normal limits. The basal  ganglia are unremarkable in appearance. The cerebral hemispheres demonstrate grossly normal gray-white differentiation. No mass effect or midline shift is seen.  There is no evidence of fracture; visualized osseous structures are unremarkable in appearance. The visualized portions of the orbits are within normal limits. The paranasal sinuses and mastoid air cells are well-aerated. No significant soft tissue abnormalities are seen.  IMPRESSION: 1. No acute intracranial pathology seen on CT. 2. Mild to moderate cortical volume loss and scattered small vessel ischemic microangiopathy. 3. The left external auditory canal and inner ear are grossly unremarkable in appearance. The left mastoid air cells are well-aerated.   Electronically Signed   By: Garald Balding M.D.   On: 01/31/2015 22:50   Mr Virgel Paling GG Contrast  02/02/2015   ADDENDUM REPORT: 02/02/2015 18:42  ADDENDUM: In the LEFT occipital region, there is a 3 mm thick isointense fluid collection on FLAIR imaging, trace susceptibility on gradient sequence, not visible on CT, suggesting a small subacute subdural hematoma or hygroma. This is remote from the small acute infarcts. No history of trauma or anticoagulation. Significance uncertain as the patient's headache is  reportedly maxillary. Findings discussed with Dr. Ree Kida at time of addendum.   Electronically Signed   By: Staci Righter M.D.   On: 02/02/2015 18:42   02/02/2015   CLINICAL DATA:  Patient presents to the emergency department with headache that sound going for the last week. The patient describes a headache that is intermittent in nature and lasts for a short period of time is mainly left-sided, radiates into her ear. Patient describes it as a sharp sensation. She denies having any visual changes, nausea, vomiting, weakness, dizziness, neck pain, chest pain, shortness of breath, fever, lightheadedness or syncope. Patient states nothing seems make her condition better or worse. The patient has not taken any medications at home for her headache. The patient was seen in the emergency department 2 nights ago. The daughter was insistent that she come back to the emergency department for further evaluation. History of hypertension. History of cardiac arrhythmia  EXAM: MRI HEAD WITHOUT CONTRAST  MRA HEAD WITHOUT CONTRAST a are  TECHNIQUE: Multiplanar, multiecho pulse sequences of the brain and surrounding structures were obtained without intravenous contrast. Angiographic images of the head were obtained using MRA technique without contrast.  COMPARISON:  CT head 01/31/15.  FINDINGS: MRI HEAD FINDINGS  Subcentimeter areas of acute infarction affect the LEFT posterior frontal and parietal cortex near the vertex.  No hemorrhage, mass lesion, hydrocephalus, or extra-axial fluid. Generalized atrophy. Moderately advanced chronic microvascular ischemic change. Flow voids are preserved. No midline abnormality. No osseous findings. Extracranial soft tissues unremarkable. Moderate retention cyst formation RIGHT maxillary sinus. Negative orbits.  Compared with prior CT, the infarcts are not visible.  MRA HEAD FINDINGS  Dolichoectatic but widely patent internal carotid arteries. Basilar artery widely patent with both vertebrals  contributing. New no proximal stenosis of the anterior, middle, or posterior cerebral arteries. No MCA branch occlusion. No intracranial aneurysm.  IMPRESSION: Subcentimeter foci of restricted diffusion in the LEFT posterior frontal and parietal cortex consistent with multiple small infarcts. These are nonhemorrhagic. See discussion above.  Atrophy and small vessel disease.  No proximal flow reducing lesion is evident.  Electronically Signed: By: Staci Righter M.D. On: 02/02/2015 15:54   Mr Brain Wo Contrast  02/02/2015   ADDENDUM REPORT: 02/02/2015 18:42  ADDENDUM: In the LEFT occipital region, there is a 3 mm thick isointense fluid collection on FLAIR imaging, trace susceptibility on gradient  sequence, not visible on CT, suggesting a small subacute subdural hematoma or hygroma. This is remote from the small acute infarcts. No history of trauma or anticoagulation. Significance uncertain as the patient's headache is reportedly maxillary. Findings discussed with Dr. Ree Kida at time of addendum.   Electronically Signed   By: Staci Righter M.D.   On: 02/02/2015 18:42   02/02/2015   CLINICAL DATA:  Patient presents to the emergency department with headache that sound going for the last week. The patient describes a headache that is intermittent in nature and lasts for a short period of time is mainly left-sided, radiates into her ear. Patient describes it as a sharp sensation. She denies having any visual changes, nausea, vomiting, weakness, dizziness, neck pain, chest pain, shortness of breath, fever, lightheadedness or syncope. Patient states nothing seems make her condition better or worse. The patient has not taken any medications at home for her headache. The patient was seen in the emergency department 2 nights ago. The daughter was insistent that she come back to the emergency department for further evaluation. History of hypertension. History of cardiac arrhythmia  EXAM: MRI HEAD WITHOUT CONTRAST  MRA HEAD  WITHOUT CONTRAST a are  TECHNIQUE: Multiplanar, multiecho pulse sequences of the brain and surrounding structures were obtained without intravenous contrast. Angiographic images of the head were obtained using MRA technique without contrast.  COMPARISON:  CT head 01/31/15.  FINDINGS: MRI HEAD FINDINGS  Subcentimeter areas of acute infarction affect the LEFT posterior frontal and parietal cortex near the vertex.  No hemorrhage, mass lesion, hydrocephalus, or extra-axial fluid. Generalized atrophy. Moderately advanced chronic microvascular ischemic change. Flow voids are preserved. No midline abnormality. No osseous findings. Extracranial soft tissues unremarkable. Moderate retention cyst formation RIGHT maxillary sinus. Negative orbits.  Compared with prior CT, the infarcts are not visible.  MRA HEAD FINDINGS  Dolichoectatic but widely patent internal carotid arteries. Basilar artery widely patent with both vertebrals contributing. New no proximal stenosis of the anterior, middle, or posterior cerebral arteries. No MCA branch occlusion. No intracranial aneurysm.  IMPRESSION: Subcentimeter foci of restricted diffusion in the LEFT posterior frontal and parietal cortex consistent with multiple small infarcts. These are nonhemorrhagic. See discussion above.  Atrophy and small vessel disease.  No proximal flow reducing lesion is evident.  Electronically Signed: By: Staci Righter M.D. On: 02/02/2015 15:54    CBC  Recent Labs Lab 02/02/15 1013 02/03/15 0855  WBC 8.6 6.0  HGB 11.7* 11.3*  HCT 35.1* 33.8*  PLT 213 247  MCV 91.9 91.4  MCH 30.6 30.5  MCHC 33.3 33.4  RDW 13.9 13.6  LYMPHSABS 1.9  --   MONOABS 1.0  --   EOSABS 0.1  --   BASOSABS 0.0  --     Chemistries   Recent Labs Lab 02/02/15 1013 02/03/15 0855  NA 139 140  K 3.8 4.3  CL 104 106  CO2 23 25  GLUCOSE 96 190*  BUN 12 14  CREATININE 0.83 1.01*  CALCIUM 9.0 9.0    ------------------------------------------------------------------------------------------------------------------ estimated creatinine clearance is 31.5 mL/min (by C-G formula based on Cr of 1.01). ------------------------------------------------------------------------------------------------------------------  Recent Labs  02/02/15 1722  HGBA1C 5.3   ------------------------------------------------------------------------------------------------------------------  Recent Labs  02/03/15 0705  CHOL 197  HDL 65  LDLCALC 120*  TRIG 61  CHOLHDL 3.0   ------------------------------------------------------------------------------------------------------------------ No results for input(s): TSH, T4TOTAL, T3FREE, THYROIDAB in the last 72 hours.  Invalid input(s): FREET3 ------------------------------------------------------------------------------------------------------------------ No results for input(s): VITAMINB12, FOLATE, FERRITIN, TIBC, IRON, RETICCTPCT in  the last 72 hours.  Coagulation profile No results for input(s): INR, PROTIME in the last 168 hours.  No results for input(s): DDIMER in the last 72 hours.  Cardiac Enzymes No results for input(s): CKMB, TROPONINI, MYOGLOBIN in the last 168 hours.  Invalid input(s): CK ------------------------------------------------------------------------------------------------------------------ Invalid input(s): POCBNP    Mikeal Winstanley D.O. on 02/03/2015 at 12:54 PM  Between 7am to 7pm - Pager - [phone]  After 7pm go to [email or web address] - password [email or web address]  And look for the night coverage person covering for me after hours  [facility] Office  [phone]

## 2015-02-03 NOTE — Progress Notes (Addendum)
Echo tech called out reporting seizure activity, upon assessment, pt is having active and rhythmic jerking movements to the right side (RUE and RLE), R eye was twitching rhythmically, no movement of the L eye.  She had a constant R gaze throughout the episode, could not follow commands, pupils 4 bilaterally and non-reactive.  Pt tachycardic at 145 bpm, BP 166/83, sats 100% on 2 L O2 [place], pt was very rigid.  Rapid Response, attending, and neuro were notified.  Pt now resting and following all commands, pupils now reacting to light, and pt is still nonverbal at this time.  RN will monitor.

## 2015-02-03 NOTE — Progress Notes (Signed)
SLP Cancellation Note  Patient Details Name: [name] MRN: [id number] DOB: April 09, 1925   Cancelled treatment:       Reason Eval/Treat Not Completed: Medical issues which prohibited therapy.  Per RN, pt experiencing seizures this am.  Will hold evaluation and f/u when appropriate.   Blenda Mounts Laurice 02/03/2015, 9:33 AM

## 2015-02-04 ENCOUNTER — Ambulatory Visit (HOSPITAL_COMMUNITY): Payer: Medicare Other

## 2015-02-04 ENCOUNTER — Encounter (HOSPITAL_COMMUNITY): Payer: PRIVATE HEALTH INSURANCE

## 2015-02-04 ENCOUNTER — Inpatient Hospital Stay (HOSPITAL_COMMUNITY): Payer: Medicare Other

## 2015-02-04 ENCOUNTER — Other Ambulatory Visit (HOSPITAL_COMMUNITY): Payer: PRIVATE HEALTH INSURANCE

## 2015-02-04 DIAGNOSIS — N390 Urinary tract infection, site not specified: Secondary | ICD-10-CM | POA: Insufficient documentation

## 2015-02-04 DIAGNOSIS — I639 Cerebral infarction, unspecified: Secondary | ICD-10-CM

## 2015-02-04 LAB — HEMOGLOBIN A1C
Hgb A1c MFr Bld: 5.5 % (ref 4.8–5.6)
Mean Plasma Glucose: 111 mg/dL

## 2015-02-04 LAB — CBC
HCT: 31.1 % — ABNORMAL LOW (ref 36.0–46.0)
Hemoglobin: 10.2 g/dL — ABNORMAL LOW (ref 12.0–15.0)
MCH: 29.9 pg (ref 26.0–34.0)
MCHC: 32.8 g/dL (ref 30.0–36.0)
MCV: 91.2 fL (ref 78.0–100.0)
PLATELETS: 254 10*3/uL (ref 150–400)
RBC: 3.41 MIL/uL — AB (ref 3.87–5.11)
RDW: 13.8 % (ref 11.5–15.5)
WBC: 10.9 10*3/uL — ABNORMAL HIGH (ref 4.0–10.5)

## 2015-02-04 LAB — BASIC METABOLIC PANEL
Anion gap: 7 (ref 5–15)
BUN: 17 mg/dL (ref 6–20)
CALCIUM: 8.6 mg/dL — AB (ref 8.9–10.3)
CO2: 26 mmol/L (ref 22–32)
Chloride: 108 mmol/L (ref 101–111)
Creatinine, Ser: 0.94 mg/dL (ref 0.44–1.00)
GFR calc non Af Amer: 53 mL/min — ABNORMAL LOW (ref >60)
GLUCOSE: 118 mg/dL — AB (ref 65–99)
Potassium: 5 mmol/L (ref 3.5–5.1)
SODIUM: 141 mmol/L (ref 135–145)

## 2015-02-04 LAB — URINALYSIS, ROUTINE W REFLEX MICROSCOPIC
BILIRUBIN URINE: NEGATIVE
GLUCOSE, UA: NEGATIVE mg/dL
Ketones, ur: 15 mg/dL — AB
NITRITE: NEGATIVE
Protein, ur: 100 mg/dL — AB
SPECIFIC GRAVITY, URINE: 1.029 (ref 1.005–1.030)
Urobilinogen, UA: 1 mg/dL (ref 0.0–1.0)
pH: 5.5 (ref 5.0–8.0)

## 2015-02-04 LAB — URINE MICROSCOPIC-ADD ON

## 2015-02-04 MED ORDER — DIVALPROEX SODIUM 250 MG PO DR TAB
250.0000 mg | DELAYED_RELEASE_TABLET | Freq: Two times a day (BID) | ORAL | Status: DC
  Administered 2015-02-04 – 2015-02-05 (×3): 250 mg via ORAL
  Filled 2015-02-04 (×4): qty 1

## 2015-02-04 MED ORDER — CEFTRIAXONE SODIUM IN DEXTROSE 20 MG/ML IV SOLN
1.0000 g | INTRAVENOUS | Status: DC
  Administered 2015-02-04: 1 g via INTRAVENOUS
  Filled 2015-02-04 (×2): qty 50

## 2015-02-04 MED ORDER — POLYVINYL ALCOHOL 1.4 % OP SOLN
1.0000 [drp] | Freq: Two times a day (BID) | OPHTHALMIC | Status: DC
  Administered 2015-02-04 – 2015-02-05 (×2): 1 [drp] via OPHTHALMIC

## 2015-02-04 NOTE — Progress Notes (Signed)
Pharmacy note: rocephin  81 yo female with UA showing many bacteria, large leukocytes. Pharmacy has been consulted to begin rocephin. WBC= 10.9, afebrile, SCr= 0.94 and CrCl ~ 30.   6/23 rocephin>>  6/22 blood x2 6/23 urine  Plan -Rocephin 1gm IV q24hr -Will follow renal function, cultures and clinical progress  Harland German, Pharm D 02/04/2015 2:55 PM

## 2015-02-04 NOTE — Progress Notes (Signed)
phy there rec hhpt. Left [facility] agency list in room. Left sticky note for md for hhc orders. Will cont to follow.

## 2015-02-04 NOTE — Progress Notes (Signed)
Occupational Therapy Evaluation Patient Details Name: Anna Mccoy MRN: [id number] DOB: 07/22/1925 Today's Date: 02/04/2015    History of Present Illness 81 yo with history of hypertension, hypothyroidism, history of irregular heartbeat, but presented to the emergency department with complaints of headache with MRI demonstrating Left CVA and SDH with Sz 6/22   Clinical Impression   PTA, pt independent with ADL and mobility. Pt presents with deficits listed below. Discussed recommendations of 24/7 S initially with family. Pt will benefit from [facility] after D/C. Will follow acutely to address established goals and facilitate safe D/C home.     Follow Up Recommendations  Supervision/Assistance - 24 hour;Home health OT    Equipment Recommendations  Tub/shower bench    Recommendations for Other Services Speech consult - cognitive linguistic eval     Precautions / Restrictions Precautions Precautions: Fall Restrictions Weight Bearing Restrictions: No      Mobility Bed Mobility             General bed mobility comments: Pt up in chair  Transfers Overall transfer level: Needs assistance Equipment used: 1 person hand held assist Transfers: Sit to/from Stand;Stand Pivot Transfers Sit to Stand: Min guard Stand pivot transfers: Min assist       General transfer comment: cues for hand placement and safety  L bias during standing    Balance Overall balance assessment: Needs assistance   Sitting balance-Leahy Scale: Good       Standing balance-Leahy Scale: Poor                              ADL Overall ADL's : Needs assistance/impaired Eating/Feeding: Set up   Grooming: Set up;Supervision/safety;Standing   Upper Body Bathing: Set up;Supervision/ safety;Sitting   Lower Body Bathing: Min guard;Sit to/from stand   Upper Body Dressing : Set up;Supervision/safety;Sitting   Lower Body Dressing: Minimal assistance   Toilet Transfer: Minimal  assistance;Ambulation;Comfort height toilet   Toileting- Clothing Manipulation and Hygiene: Min guard;Sit to/from stand       Functional mobility during ADLs: Minimal assistance General ADL Comments: Question minimal R side inattention during ADL task. Slower problem solving. minimal difficulty organizing tasks. dropping items with R hand when distracted. Did not turn running water off at end of session.      Vision Additional Comments: Pt reports change in vision - will further assess Pt does wear glasses and recently had cataract surgery  Perception     Praxis Praxis Praxis tested?: Deficits Praxis-Other Comments: will further assess. family reports pt trying to use spoon likde a straw    Pertinent Vitals/Pain Pain Assessment: 0-10 Pain Score: 4  Pain Location: headache Pain Descriptors / Indicators: Aching Pain Intervention(s): Limited activity within patient's tolerance     Hand Dominance Right   Extremity/Trunk Assessment Upper Extremity Assessment Upper Extremity Assessment: RUE deficits/detail (minimal R drift noted) RUE Deficits / Details: "clumsy hand" - pt with increased "dropping" of items during ADL. Decreased awareness of R hand at times. when reaching behind for pericare, pt had difficulty holding onto items.  RUE Coordination: decreased fine motor   Lower Extremity Assessment Lower Extremity Assessment: Defer to PT evaluation   Cervical / Trunk Assessment Cervical / Trunk Assessment: Kyphotic   Communication Communication Communication: No difficulties   Cognition Arousal/Alertness: Awake/alert Behavior During Therapy: WFL for tasks assessed/performed Overall Cognitive Status: Impaired/Different from baseline Area of Impairment: Attention;Safety/judgement;Awareness;Problem solving   Current Attention Level: Selective     Safety/Judgement:  Decreased awareness of deficits Awareness: Emergent Problem Solving: Slow processing;Requires verbal  cues General Comments: Daughter states pt trying to use spoon "like a straw"/ Pt demonstrates difficulty with answering the year - "it's 20.Marland KitchenMarland Kitchen"Left water running throughout ADL session and did not realize she had not turned it off                      Home Living Family/patient expects to be discharged to:: Private residence Living Arrangements: Children;Other relatives Available Help at Discharge: Family;Available 24 hours/day Type of Home: House Home Access: Stairs to enter;Ramped entrance Entrance Stairs-Number of Steps: 3   Home Layout: One level     Bathroom Shower/Tub: Tub/shower unit Shower/tub characteristics: Engineer, building services: Standard Bathroom Accessibility: Yes How Accessible: Accessible via walker Home Equipment: Walker - 2 wheels;Cane - single point;Toilet riser;Shower seat          Prior Functioning/Environment Level of Independence: Independent with assistive device(s)        Comments: pt typically uses cane outside, performs her own ADLs and assists with cooking. drives    OT Diagnosis: Generalized weakness;Cognitive deficits   OT Problem List: Decreased activity tolerance;Impaired balance (sitting and/or standing);Decreased coordination;Decreased cognition;Decreased safety awareness;Decreased knowledge of use of DME or AE;Impaired UE functional use   OT Treatment/Interventions: Self-care/ADL training;Therapeutic exercise;Neuromuscular education;DME and/or AE instruction;Energy conservation;Therapeutic activities;Cognitive remediation/compensation;Patient/family education;Balance training    OT Goals(Current goals can be found in the care plan section) Acute Rehab OT Goals Patient Stated Goal: be independent again OT Goal Formulation: With patient Time For Goal Achievement: 02/18/15 Potential to Achieve Goals: Good  OT Frequency: Min 2X/week   Barriers to D/C:            Co-evaluation              End of Session Nurse  Communication: Mobility status  Activity Tolerance: Patient tolerated treatment well Patient left: in chair;with call bell/phone within reach;with family/visitor present   Time: 0955-1050 OT Time Calculation (min): 55 min Charges:  OT General Charges $OT Visit: 1 Procedure OT Evaluation $Initial OT Evaluation Tier I: 1 Procedure OT Treatments $Self Care/Home Management : 38-52 mins G-Codes:    Averi Cacioppo,HILLARY March 05, 2015, 11:11 AM   Luisa Dago, OTR/L  [phone] March 05, 2015

## 2015-02-04 NOTE — Progress Notes (Signed)
Patient transferred from unit 2H to room 4N20 at this time. Alert and in stable condition.

## 2015-02-04 NOTE — Progress Notes (Signed)
STROKE TEAM PROGRESS NOTE   HISTORY Anna Mccoy is an 81 y.o. female with one week of intermittent stabbing/lancating pain that is located on the left maxillary region and extends to the left ear. Episodes will last seconds to up to 4 minutes and then dissipate. She can not pinpoint top any precipitant factors. No increased lacrimation, nasal congestion, periorbital swelling, or eye pain.She denies any blurred or loss of vision, decreased hearing, tingling or loss of sensation, pain while chewing, jaw claudication, or localizing stimuli that will induce pain. The lancating pain will be harsh enough to keep her from sleeping but not eating but she doesn't pace the floor. No photophobia, phonophobia, nausea or vomiting. CT brain 6/19 was reviewed and showed no acute abnormality.   SUBJECTIVE (INTERVAL HISTORY) Her 1 daughter is at the bedside.  Overall she feels her condition is stable now. No further seizures -her speech is much improved and her cognition is also better and back to baseline. MRI brain with contrast shows asymmetric meningeal thickening over the left convexity but distinct from the small subdural which is unchanged OBJECTIVE Temp:  [97.4 F (36.3 C)-97.9 F (36.6 C)] 97.8 F (36.6 C) (06/23 1111) Pulse Rate:  [61-87] 81 (06/23 1111) Cardiac Rhythm:  [-] Normal sinus rhythm;Bundle branch block (06/23 0800) Resp:  [17-27] 24 (06/23 1111) BP: (103-154)/(51-83) 154/80 mmHg (06/23 1111) SpO2:  [96 %-100 %] 100 % (06/23 1111)  No results for input(s): GLUCAP in the last 168 hours.  Recent Labs Lab 02/02/15 1013 02/03/15 0855 02/04/15 0245  NA 139 140 141  K 3.8 4.3 5.0  CL 104 106 108  CO2 $Re'23 25 26  'FZC$ GLUCOSE 96 190* 118*  BUN $Re'12 14 17  'ZMe$ CREATININE 0.83 1.01* 0.94  CALCIUM 9.0 9.0 8.6*   No results for input(s): AST, ALT, ALKPHOS, BILITOT, PROT, ALBUMIN in the last 168 hours.  Recent Labs Lab 02/02/15 1013 02/03/15 0855 02/04/15 0245  WBC 8.6 6.0 10.9*   NEUTROABS 5.5  --   --   HGB 11.7* 11.3* 10.2*  HCT 35.1* 33.8* 31.1*  MCV 91.9 91.4 91.2  PLT 213 247 254   No results for input(s): CKTOTAL, CKMB, CKMBINDEX, TROPONINI in the last 168 hours. No results for input(s): LABPROT, INR in the last 72 hours.  Recent Labs  02/04/15 0539  COLORURINE YELLOW  LABSPEC 1.029  PHURINE 5.5  GLUCOSEU NEGATIVE  HGBUR TRACE*  BILIRUBINUR NEGATIVE  KETONESUR 15*  PROTEINUR 100*  UROBILINOGEN 1.0  NITRITE NEGATIVE  LEUKOCYTESUR LARGE*       Component Value Date/Time   CHOL 197 02/03/2015 0705   TRIG 61 02/03/2015 0705   HDL 65 02/03/2015 0705   CHOLHDL 3.0 02/03/2015 0705   VLDL 12 02/03/2015 0705   LDLCALC 120* 02/03/2015 0705   Lab Results  Component Value Date   HGBA1C 5.5 02/03/2015   No results found for: LABOPIA, COCAINSCRNUR, LABBENZ, AMPHETMU, THCU, LABBARB  No results for input(s): ETH in the last 168 hours.  Dg Chest 2 View 02/02/2015   No evidence of acute cardiopulmonary disease.    Mr Brain Wo Contrast 02/02/2015    In the LEFT occipital region, there is a 3 mm thick isointense fluid collection on FLAIR imaging, trace susceptibility on gradient sequence, not visible on CT, suggesting a small subacute subdural hematoma or hygroma. This is remote from the small acute infarcts. No history of trauma or anticoagulation. Significance uncertain as the patient's headache is reportedly maxillary. Subcentimeter foci of restricted diffusion  in the LEFT posterior frontal and parietal cortex consistent with multiple small infarcts. These are nonhemorrhagic. Atrophy and small vessel disease.    Mr Anna Mccoy Head Wo Contrast 02/02/2015   No proximal flow reducing lesion is evident.   MRI Brain with contrast 02/03/2015 : Smooth dural thickening and enhancement overlying the left cerebral hemisphere as above. This is felt to most likely be reactive in nature due to the presence of the left subdural collection as identified on previous MRI. The  previously identified left occipital collection itself does not enhance, and most likely reflects a small subacute subdural hemorrhage related to recent fall/ trauma. EEG  02/03/2015 shows left temporal slowing and rare left temporal sharp transients   PHYSICAL EXAM Frail elderly  lady not in distress. . Afebrile. Head is nontraumatic. Neck is supple without bruit.    Cardiac exam no murmur or gallop. Lungs are clear to auscultation. Distal pulses are well felt. Neurological Exam : Awake alert disoriented. Diminished attention, registration and recall. Speech is nonfluent with significant word hesitancy, expressive greater than receptive difficulties. Follows simple midline and on step commands. Extraocular movements are full range without nystagmus. Blinks to threat bilaterally. Fundi were not visualized. Vision acuity seems adequate. Face is symmetric without weakness. Tongue is midline. Motor system exam revealed no upper or lower extremity drift. No focal weakness. Reflexes are 2+ symmetric. Plantars are downgoing. Gait was not tested. Sensation appears intact bilaterally. ASSESSMENT/PLAN Anna Mccoy is a 81 y.o. female with history of hypertension, hypothyroidism and irregular heartbeat presenting with headache. Subsequent MRI shows small left occipital subdural hemorrhage, left posterior frontal and parietal multiple small embolic infarcts  She did not receive IV t-PA due to stroke not recognized on admission, nonfocal.   Neurologic Headache Left occipital "collection" subdural hemorrhage secondary to fall 1 week prior vs meningial thickening secondary to RA Stroke:  Incidental left posterior frontal and parietal multiple small infarcts secondary to small vessel disease versus rheumatoid vasculitis  Resultant  Left temporal neuralgic Headache  MRI  Left posterior frontal and parietal multiple small infarcts  MRA  Unremarkable  MRI with contrast to evaluate for hemorrhage vs  meningeal thickening  Carotid Doppler  pending   2D Echo  pending   HgbA1c 5.3  SCDs for VTE prophylaxis Diet Heart Room service appropriate?: Yes; Fluid consistency:: Thin  aspirin 81 mg orally every day and clopidogrel 75 mg orally every day prior to admission, now on aspirin 81 mg orally every day  Therapy recommendations:  Home PT  Disposition:  Home  Seizure  started R hand, x 10 mins  Secondary episode included R hand and R leg  Resolved with ativan 2 mg  Transferred to [place]  Resultant expressive aphasia  New tegretol for headache started, will discontinue as trigeminal neuralgia not suspected.  (no vision loss or  jaw pain, but ear pain)  Add depacon 750 mg IV now followed by 250 mg bid  Questionable history of atrial fibrillation  Irregular heartbeat per patient  Not on anticoagulation prior to admission  Telemetry monitoring/EKG underway  Hypertension  stable  Hyperlipidemia  Home meds:  No statin  LDL 120, goal < 70  Add statin   Continue statin at discharge  Other Stroke Risk Factors  Advanced age  Hx TIA  Family hx stroke (mother)  Other Active Problems  Rheumatoid arthritis  hypothyroidism  Other Pertinent History  Recent cataract surgery in R eye in May 2016  Hospital day # 2  Carsyn Taubman  [facility] See Amion for Pager information 02/04/2015 1:30 PM  I have personally examined this patient, reviewed notes, independently viewed imaging studies, participated in medical decision making and plan of care. I have made any additions or clarifications directly to the above note. Agree with note above. 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    Continue Depakote for  headache and seizures  Discussed with patient and daughter at the bedside and answered questions . I do not think patient needs temporal artery biopsy as suspicion for temporal arteritis is low. Elevated ESR is likely reflective of patient's rheumatoid arthritis. Stroke team will sign off. Kindly call for questions. Discussed with Dr. Dierdre Forth, MD Medical Director [place] Pager: [phone] 02/04/2015 1:30 PM    To contact Stroke Continuity provider, please refer to [email or web address] After hours, contact General Neurology

## 2015-02-04 NOTE — Evaluation (Signed)
Physical Therapy Evaluation Patient Details Name: Anna Mccoy MRN: [id number] DOB: 11-Jul-1925 Today's Date: 02/04/2015   History of Present Illness  81 yo with history of hypertension, hypothyroidism, history of irregular heartbeat, but presented to the emergency department with complaints of headache with MRI demonstrating Left CVA and SDH with Sz 6/22  Clinical Impression  Pt very pleasant, moving well without strength or sensation deficits all extremities. Pt with decreased activity tolerance and balance who will benefit from acute therapy to maximize mobility, function, and gait to decrease burden of care. Recommend daily ambulation with nursing and RW acutely and use of RW at home.     Follow Up Recommendations Home health PT    Equipment Recommendations  None recommended by PT    Recommendations for Other Services       Precautions / Restrictions Precautions Precautions: Fall      Mobility  Bed Mobility Overal bed mobility: Modified Independent                Transfers Overall transfer level: Needs assistance   Transfers: Sit to/from Stand Sit to Stand: Supervision         General transfer comment: cues for hand placement and safety  Ambulation/Gait Ambulation/Gait assistance: Min guard Ambulation Distance (Feet): 160 Feet Assistive device: Rolling walker (2 wheeled) Gait Pattern/deviations: Step-through pattern;Decreased stride length;Trunk flexed   Gait velocity interpretation: Below normal speed for age/gender General Gait Details: pt initially attempted gait with cane but reaching out for additional environmental support and switched to RW after 15' with improved balance and control. Cues to step into RW and for increased upright posture  Stairs            Wheelchair Mobility    Modified Rankin (Stroke Patients Only)       Balance Overall balance assessment: Needs assistance   Sitting balance-Leahy Scale: Good       Standing  balance-Leahy Scale: Poor                               Pertinent Vitals/Pain Pain Assessment: 0-10 Pain Score: 6  Pain Location: left sided HA Pain Descriptors / Indicators: Aching Pain Intervention(s): Repositioned  HR 80 sats 99% on RA 143/83    Home Living Family/patient expects to be discharged to:: Private residence Living Arrangements: Children;Other relatives Available Help at Discharge: Family;Available 24 hours/day Type of Home: House Home Access: Stairs to enter;Ramped entrance   Entrance Stairs-Number of Steps: 3 Home Layout: One level Home Equipment: Walker - 2 wheels;Cane - single point;Toilet riser;Shower seat      Prior Function Level of Independence: Independent with assistive device(s)         Comments: pt typically uses cane outside, performs her own ADLs and assists with cooking     Hand Dominance        Extremity/Trunk Assessment   Upper Extremity Assessment: Overall WFL for tasks assessed           Lower Extremity Assessment: Overall WFL for tasks assessed      Cervical / Trunk Assessment: Kyphotic  Communication   Communication: No difficulties  Cognition Arousal/Alertness: Awake/alert Behavior During Therapy: WFL for tasks assessed/performed Overall Cognitive Status: Within Functional Limits for tasks assessed                      General Comments      Exercises  Assessment/Plan    PT Assessment Patient needs continued PT services  PT Diagnosis Difficulty walking   PT Problem List Decreased activity tolerance;Decreased balance;Decreased knowledge of use of DME  PT Treatment Interventions Gait training;DME instruction;Functional mobility training;Stair training;Therapeutic activities;Balance training;Patient/family education   PT Goals (Current goals can be found in the Care Plan section) Acute Rehab PT Goals Patient Stated Goal: return home PT Goal Formulation: With patient/family Time For  Goal Achievement: 02/18/15 Potential to Achieve Goals: Good    Frequency Min 3X/week   Barriers to discharge Decreased caregiver support lives with sister who is not in the best health and 2 dgtrs who work during the day    Co-evaluation               End of Session Equipment Utilized During Treatment: Gait belt Activity Tolerance: Patient tolerated treatment well Patient left: in chair;with call bell/phone within reach;with family/visitor present Nurse Communication: Mobility status         Time: 2035-5974 PT Time Calculation (min) (ACUTE ONLY): 21 min   Charges:   PT Evaluation $Initial PT Evaluation Tier I: 1 Procedure     PT G CodesDelorse Lek 02/04/2015, 8:48 AM Toney Sang Mcgregor T[place], PT [phone]

## 2015-02-04 NOTE — Progress Notes (Signed)
[facility]                                                                              Patient Demographics  Anna Mccoy, is a 81 y.o. female, DOB - 13-Jan-1925, DTO:[id number]  Admit date - 02/02/2015   Admitting Physician Cristal Ford, DO  Outpatient Primary MD for the patient is Woody Seller, MD  LOS - 2   Chief Complaint  Patient presents with  . Headache      HPI on 02/02/2015 Anna Mccoy is a 81 y.o. female with a history of hypertension, hypothyroidism, history of irregular heartbeat, but presented to the emergency department with complaints of headache. Patient states her headaches have been ongoing for approximately 1 week and are intermittent, stabbing pain noted on the left side of her face down to her maxillary region and to her ear. Patient states these episodes last up to 5 minutes and then go away. She has taken Tylenol which has helped her pain. She denied any photophobia or phonophobia, nausea, vomiting. Patient denies any visual changes or decreased hearing. Patient had presented to [facility] 2 days ago for head, CT of the head was negative. Patient was sent home and told to follow-up with her primary care doctor. She presented again to the ER with complaints of headache. Neurology was consulted and recommended MRI of the brain which did show acute CVA. [facility] was called for admission.  Assessment & Plan  New onset Seizure -Patient had 2 witnessed seizures.  Ativan 70m ordered and given.  -Currently maintaining her airway (Patient is a DNR) -Was initially transferred to stepdown.   -Neurology consulted and appreciated -EEG: Abnormal electroencephalogram secondary to left temporal slowing and rare left temporal sharp transients, consistent with left hemispheric acute infarcts on MRI. -Patient was started on depakote -CXR negative for infection -UA: WBC TNTC, many bacteria, large leukocytes (urine culture pending, will start on  ceftriaxone) -Currently afebrile, no leukocytosis  Acute CVA -CT head on 01/31/2015: No acute intracranial pathology -MRI brain: Subcentimeter foci of restricted diffusion in the left posterior frontal and parietal cortex, multiple small infacrs -MRI brain with contrast: Smooth pleural thickening and enhancement likely reactive to left subdural collection. -Risk factors: age, irregular heartbeat, HTN -Pending carotid doppler -Echocardiogram: EF 55-60%, no defect or patent foramen ovale identified. -Hemoglobin A1c 5.3, Lipid panel 120 -PT/OT consulted- recommended home health -Neurology consulted and appreciated -Continue aspirin  -Spoke with Dr. SLeonie Manregarding restarting plavix, given SDH.  Ok to restart.   Subdural hematoma -Received a call from radiology, stating patient had 351mleft subdural hematoma. -Possible related to her fall a week ago.  Headache -Unlikely related to acute CVA -Pending further recommendations from neurology- thought to be trigeminal neuralgia vs temporal arteritis -Headache affecting left side -Continue tylenol -Neurology initially recommended carbmazepine and prednisone. Spoke with Dr. CaAram Beechamhis morning, patient may need temporal artery biopsy. -Spoke with Dr. SeLeonie Mandepakote should help with headaches, no need for biopsy -CRP 5.9, ESR 81  Hypothyroidism -Continue synthroid  Questionable history of atrial fibrillation -per patient, she has a history of "irregular heartbeat" but is only on plavix/Aspirin -Continue telemetry monitoring-shows SR -echocardiogram as above  Hypertension -Will allow for permissive HTN -Hold amlodipine and diovan  Arthritis -Patient takes monthly orencia  Code Status: DNR (Spoke with patient and daughter, daughter does not agree)  Family Communication: Daughter at bedside  Disposition Plan: Admitted, patient currently stable.  Will continue to monitor- transfer to tele.  Likely dc in 24-48hrs.  Time Spent in  minutes   30 minutes  Procedures  EEG Echocardiogram  Consults   Neurology  DVT Prophylaxis  SCDs  Lab Results  Component Value Date   PLT 254 02/04/2015    Medications  Scheduled Meds: .  stroke: mapping our early stages of recovery book   Does not apply Once  . aspirin EC  81 mg Oral Daily  . atorvastatin  10 mg Oral q1800  . clopidogrel  75 mg Oral Q breakfast  . divalproex  250 mg Oral Q12H  . famotidine  20 mg Oral Daily  . folic acid  1 mg Oral Daily  . leflunomide  20 mg Oral QODAY  . levothyroxine  75 mcg Oral QAC breakfast  . LORazepam  2 mg Intravenous Once  . polyvinyl alcohol  1 drop Both Eyes BID  . potassium chloride SA  20 mEq Oral BID  . prednisoLONE acetate  1 drop Right Eye TID   Continuous Infusions:  PRN Meds:.acetaminophen, senna-docusate  Antibiotics    Anti-infectives    None      Subjective:   Anna Mccoy seen and examined today.  Patient states she is feeling better.  She cannot recall the exact events that occurred yesterday.  Feels her headache has improved.  Denies chest pain, shortness of breath, abdominal pain, nausea, vomiting.  Complains of feeling weak.   Objective:   Filed Vitals:   02/04/15 0800 02/04/15 0843 02/04/15 1111 02/04/15 1200  BP: 143/83  154/80 123/72  Pulse:  83 81 63  Temp:   97.8 F (36.6 C)   TempSrc:   Oral   Resp:   24 22  Height:      Weight:      SpO2:  100% 100% 98%    Wt Readings from Last 3 Encounters:  02/03/15 50.803 kg (112 lb)  01/31/15 53.071 kg (117 lb)  12/24/14 52.345 kg (115 lb 6.4 oz)     Intake/Output Summary (Last 24 hours) at 02/04/15 1418 Last data filed at 02/04/15 1100  Gross per 24 hour  Intake 1482.5 ml  Output    200 ml  Net 1282.5 ml    Exam  General: Well developed, well nourished, no distress  HEENT: NCAT, mucous membranes moist.   Cardiovascular: S1 S2 auscultated, +murmur, RRR  Respiratory: Clear to auscultation bilaterally with equal chest  rise  Abdomen: Soft, nontender, nondistended, + bowel sounds  Extremities: warm dry without cyanosis clubbing or edema  Neuro: AAOx3, nonfocal  Psych: Normal affect and demeanor, pleasant  Data Review   Micro Results Recent Results (from the past 240 hour(s))  MRSA PCR Screening     Status: None   Collection Time: 02/03/15 10:42 AM  Result Value Ref Range Status   MRSA by PCR NEGATIVE NEGATIVE Final    Comment:        The GeneXpert MRSA Assay (FDA approved for NASAL specimens only), is one component of a comprehensive MRSA colonization surveillance program. It is not intended to diagnose MRSA infection nor to guide or monitor treatment for MRSA infections.   Culture, blood (routine x 2)     Status: None (Preliminary result)  Collection Time: 02/03/15  2:00 PM  Result Value Ref Range Status   Specimen Description BLOOD RIGHT HAND  Final   Special Requests BOTTLES DRAWN AEROBIC AND ANAEROBIC 10CC  Final   Culture NO GROWTH < 24 HOURS  Final   Report Status PENDING  Incomplete  Culture, blood (routine x 2)     Status: None (Preliminary result)   Collection Time: 02/03/15  2:08 PM  Result Value Ref Range Status   Specimen Description BLOOD LEFT HAND  Final   Special Requests BOTTLES DRAWN AEROBIC AND ANAEROBIC 10CC  Final   Culture NO GROWTH < 24 HOURS  Final   Report Status PENDING  Incomplete    Radiology Reports Dg Chest 2 View  02/02/2015   CLINICAL DATA:  Acute CVA  EXAM: CHEST  2 VIEW  COMPARISON:  01/14/2009  FINDINGS: Lungs are clear.  No pleural effusion or pneumothorax.  The heart is normal in size.  Mild degenerative changes of the visualized thoracolumbar spine.  IMPRESSION: No evidence of acute cardiopulmonary disease.   Electronically Signed   By: Julian Hy M.D.   On: 02/02/2015 19:02   Ct Head Wo Contrast  01/31/2015   CLINICAL DATA:  Acute onset of left ear pain and dizziness. Initial encounter.  EXAM: CT HEAD WITHOUT CONTRAST  TECHNIQUE:  Contiguous axial images were obtained from the base of the skull through the vertex without intravenous contrast.  COMPARISON:  None.  FINDINGS: There is no evidence of acute infarction, mass lesion, or intra- or extra-axial hemorrhage on CT.  Prominence of the ventricles and sulci reflects mild to moderate cortical volume loss. Mild cerebellar atrophy is noted. Scattered periventricular and subcortical white matter change likely reflects small vessel ischemic microangiopathy.  The brainstem and fourth ventricle are within normal limits. The basal ganglia are unremarkable in appearance. The cerebral hemispheres demonstrate grossly normal gray-white differentiation. No mass effect or midline shift is seen.  There is no evidence of fracture; visualized osseous structures are unremarkable in appearance. The visualized portions of the orbits are within normal limits. The paranasal sinuses and mastoid air cells are well-aerated. No significant soft tissue abnormalities are seen.  IMPRESSION: 1. No acute intracranial pathology seen on CT. 2. Mild to moderate cortical volume loss and scattered small vessel ischemic microangiopathy. 3. The left external auditory canal and inner ear are grossly unremarkable in appearance. The left mastoid air cells are well-aerated.   Electronically Signed   By: Garald Balding M.D.   On: 01/31/2015 22:50   Mr Virgel Paling EZ Contrast  02/02/2015   ADDENDUM REPORT: 02/02/2015 18:42  ADDENDUM: In the LEFT occipital region, there is a 3 mm thick isointense fluid collection on FLAIR imaging, trace susceptibility on gradient sequence, not visible on CT, suggesting a small subacute subdural hematoma or hygroma. This is remote from the small acute infarcts. No history of trauma or anticoagulation. Significance uncertain as the patient's headache is reportedly maxillary. Findings discussed with Dr. Ree Kida at time of addendum.   Electronically Signed   By: Staci Righter M.D.   On: 02/02/2015 18:42    02/02/2015   CLINICAL DATA:  Patient presents to the emergency department with headache that sound going for the last week. The patient describes a headache that is intermittent in nature and lasts for a short period of time is mainly left-sided, radiates into her ear. Patient describes it as a sharp sensation. She denies having any visual changes, nausea, vomiting, weakness, dizziness, neck pain, chest pain,  shortness of breath, fever, lightheadedness or syncope. Patient states nothing seems make her condition better or worse. The patient has not taken any medications at home for her headache. The patient was seen in the emergency department 2 nights ago. The daughter was insistent that she come back to the emergency department for further evaluation. History of hypertension. History of cardiac arrhythmia  EXAM: MRI HEAD WITHOUT CONTRAST  MRA HEAD WITHOUT CONTRAST a are  TECHNIQUE: Multiplanar, multiecho pulse sequences of the brain and surrounding structures were obtained without intravenous contrast. Angiographic images of the head were obtained using MRA technique without contrast.  COMPARISON:  CT head 01/31/15.  FINDINGS: MRI HEAD FINDINGS  Subcentimeter areas of acute infarction affect the LEFT posterior frontal and parietal cortex near the vertex.  No hemorrhage, mass lesion, hydrocephalus, or extra-axial fluid. Generalized atrophy. Moderately advanced chronic microvascular ischemic change. Flow voids are preserved. No midline abnormality. No osseous findings. Extracranial soft tissues unremarkable. Moderate retention cyst formation RIGHT maxillary sinus. Negative orbits.  Compared with prior CT, the infarcts are not visible.  MRA HEAD FINDINGS  Dolichoectatic but widely patent internal carotid arteries. Basilar artery widely patent with both vertebrals contributing. New no proximal stenosis of the anterior, middle, or posterior cerebral arteries. No MCA branch occlusion. No intracranial aneurysm.   IMPRESSION: Subcentimeter foci of restricted diffusion in the LEFT posterior frontal and parietal cortex consistent with multiple small infarcts. These are nonhemorrhagic. See discussion above.  Atrophy and small vessel disease.  No proximal flow reducing lesion is evident.  Electronically Signed: By: Staci Righter M.D. On: 02/02/2015 15:54   Mr Brain Wo Contrast  02/02/2015   ADDENDUM REPORT: 02/02/2015 18:42  ADDENDUM: In the LEFT occipital region, there is a 3 mm thick isointense fluid collection on FLAIR imaging, trace susceptibility on gradient sequence, not visible on CT, suggesting a small subacute subdural hematoma or hygroma. This is remote from the small acute infarcts. No history of trauma or anticoagulation. Significance uncertain as the patient's headache is reportedly maxillary. Findings discussed with Dr. Ree Kida at time of addendum.   Electronically Signed   By: Staci Righter M.D.   On: 02/02/2015 18:42   02/02/2015   CLINICAL DATA:  Patient presents to the emergency department with headache that sound going for the last week. The patient describes a headache that is intermittent in nature and lasts for a short period of time is mainly left-sided, radiates into her ear. Patient describes it as a sharp sensation. She denies having any visual changes, nausea, vomiting, weakness, dizziness, neck pain, chest pain, shortness of breath, fever, lightheadedness or syncope. Patient states nothing seems make her condition better or worse. The patient has not taken any medications at home for her headache. The patient was seen in the emergency department 2 nights ago. The daughter was insistent that she come back to the emergency department for further evaluation. History of hypertension. History of cardiac arrhythmia  EXAM: MRI HEAD WITHOUT CONTRAST  MRA HEAD WITHOUT CONTRAST a are  TECHNIQUE: Multiplanar, multiecho pulse sequences of the brain and surrounding structures were obtained without intravenous  contrast. Angiographic images of the head were obtained using MRA technique without contrast.  COMPARISON:  CT head 01/31/15.  FINDINGS: MRI HEAD FINDINGS  Subcentimeter areas of acute infarction affect the LEFT posterior frontal and parietal cortex near the vertex.  No hemorrhage, mass lesion, hydrocephalus, or extra-axial fluid. Generalized atrophy. Moderately advanced chronic microvascular ischemic change. Flow voids are preserved. No midline abnormality. No osseous findings.  Extracranial soft tissues unremarkable. Moderate retention cyst formation RIGHT maxillary sinus. Negative orbits.  Compared with prior CT, the infarcts are not visible.  MRA HEAD FINDINGS  Dolichoectatic but widely patent internal carotid arteries. Basilar artery widely patent with both vertebrals contributing. New no proximal stenosis of the anterior, middle, or posterior cerebral arteries. No MCA branch occlusion. No intracranial aneurysm.  IMPRESSION: Subcentimeter foci of restricted diffusion in the LEFT posterior frontal and parietal cortex consistent with multiple small infarcts. These are nonhemorrhagic. See discussion above.  Atrophy and small vessel disease.  No proximal flow reducing lesion is evident.  Electronically Signed: By: Staci Righter M.D. On: 02/02/2015 15:54   Mr Jeri Cos Contrast  02/04/2015   CLINICAL DATA:  81 year old female with history of hypertension, hypothyroidism, found to have small left posterior frontal and parietal ischemic infarcts and small left occipital subdural hemorrhage, likely related to fall 1 week prior. Post-contrast MRI performed for further evaluation of this collection, whether this represents meningeal thickening versus hemorrhage.  EXAM: MRI HEAD WITH CONTRAST  TECHNIQUE: Multiplanar, multiecho pulse sequences of the brain and surrounding structures were obtained with intravenous contrast.  COMPARISON:  None.  CONTRAST:  2m MULTIHANCE GADOBENATE DIMEGLUMINE 529 MG/ML IV SOLN  FINDINGS:  Post-contrast imaging of the brain demonstrates mild diffuse asymmetric dural/pachymeningeal thickening and enhancement overlying the left cerebral hemisphere. There is relative sparing overlying the left anterior frontal lobe. This enhancement is fairly smooth and even throughout. While there is dural enhancement at the left occipital lobe, the previously identified small left occipital collection does not appear to enhance entirely as would be expected with meningeal thickening. A small subdural collection is again seen overlying the left cerebral convexity, best appreciated on coronal T2 weighted sequence. This measures approximately 2 mm in maximal thickness. No midline shift or mass effect.  No other abnormal enhancement within the brain.  IMPRESSION: Smooth dural thickening and enhancement overlying the left cerebral hemisphere as above. This is felt to most likely be reactive in nature due to the presence of the left subdural collection as identified on previous MRI. The previously identified left occipital collection itself does not enhance, and most likely reflects a small subacute subdural hemorrhage related to recent fall/ trauma.   Electronically Signed   By: BJeannine BogaM.D.   On: 02/04/2015 01:26    CBC  Recent Labs Lab 02/02/15 1013 02/03/15 0855 02/04/15 0245  WBC 8.6 6.0 10.9*  HGB 11.7* 11.3* 10.2*  HCT 35.1* 33.8* 31.1*  PLT 213 247 254  MCV 91.9 91.4 91.2  MCH 30.6 30.5 29.9  MCHC 33.3 33.4 32.8  RDW 13.9 13.6 13.8  LYMPHSABS 1.9  --   --   MONOABS 1.0  --   --   EOSABS 0.1  --   --   BASOSABS 0.0  --   --     Chemistries   Recent Labs Lab 02/02/15 1013 02/03/15 0855 02/04/15 0245  NA 139 140 141  K 3.8 4.3 5.0  CL 104 106 108  CO2 _0 GLUCOSE 96 190* 118*  BUN _1 CREATININE 0.83 1.01* 0.94  CALCIUM 9.0 9.0 8.6*    ------------------------------------------------------------------------------------------------------------------ estimated creatinine clearance is 33.8 mL/min (by C-G formula based on Cr of 0.94). ------------------------------------------------------------------------------------------------------------------  Recent Labs  02/02/15 1722 02/03/15 0705  HGBA1C 5.3 5.5   ------------------------------------------------------------------------------------------------------------------  Recent Labs  02/03/15 0705  CHOL 197  HDL 65  LDLCALC 120*  TRIG 61  CHOLHDL 3.0   ------------------------------------------------------------------------------------------------------------------  No results for input(s): TSH, T4TOTAL, T3FREE, THYROIDAB in the last 72 hours.  Invalid input(s): FREET3 ------------------------------------------------------------------------------------------------------------------ No results for input(s): VITAMINB12, FOLATE, FERRITIN, TIBC, IRON, RETICCTPCT in the last 72 hours.  Coagulation profile No results for input(s): INR, PROTIME in the last 168 hours.  No results for input(s): DDIMER in the last 72 hours.  Cardiac Enzymes No results for input(s): CKMB, TROPONINI, MYOGLOBIN in the last 168 hours.  Invalid input(s): CK ------------------------------------------------------------------------------------------------------------------ Invalid input(s): POCBNP    Hanan Mcwilliams D.O. on 02/04/2015 at 2:18 PM  Between 7am to 7pm - Pager - [phone]  After 7pm go to [email or web address] - password [email or web address]  And look for the night coverage person covering for me after hours  [facility] Office  [phone]

## 2015-02-04 NOTE — Progress Notes (Signed)
*  PRELIMINARY RESULTS* Vascular Ultrasound Carotid Duplex (Doppler) has been completed.  Findings suggest 1-39% internal carotid artery stenosis bilaterally. Vertebral arteries are patent with antegrade flow.  02/04/2015 4:20 PM Gertie Fey, RVT, RDCS, RDMS

## 2015-02-05 DIAGNOSIS — I639 Cerebral infarction, unspecified: Secondary | ICD-10-CM

## 2015-02-05 LAB — URINE CULTURE

## 2015-02-05 LAB — BASIC METABOLIC PANEL
Anion gap: 7 (ref 5–15)
BUN: 15 mg/dL (ref 6–20)
CALCIUM: 8.1 mg/dL — AB (ref 8.9–10.3)
CO2: 25 mmol/L (ref 22–32)
Chloride: 107 mmol/L (ref 101–111)
Creatinine, Ser: 0.82 mg/dL (ref 0.44–1.00)
GFR calc Af Amer: >60 mL/min (ref >60)
GFR calc non Af Amer: >60 mL/min (ref >60)
GLUCOSE: 82 mg/dL (ref 65–99)
POTASSIUM: 4.5 mmol/L (ref 3.5–5.1)
SODIUM: 139 mmol/L (ref 135–145)

## 2015-02-05 LAB — CBC
HCT: 31.4 % — ABNORMAL LOW (ref 36.0–46.0)
Hemoglobin: 10.4 g/dL — ABNORMAL LOW (ref 12.0–15.0)
MCH: 30.1 pg (ref 26.0–34.0)
MCHC: 33.1 g/dL (ref 30.0–36.0)
MCV: 91 fL (ref 78.0–100.0)
Platelets: 267 10*3/uL (ref 150–400)
RBC: 3.45 MIL/uL — AB (ref 3.87–5.11)
RDW: 13.9 % (ref 11.5–15.5)
WBC: 11 10*3/uL — ABNORMAL HIGH (ref 4.0–10.5)

## 2015-02-05 MED ORDER — ATORVASTATIN CALCIUM 40 MG PO TABS: 40.0000 mg | ORAL_TABLET | Freq: Every day | ORAL | Status: DC

## 2015-02-05 MED ORDER — DIVALPROEX SODIUM 250 MG PO DR TAB: 250.0000 mg | DELAYED_RELEASE_TABLET | Freq: Two times a day (BID) | ORAL | Status: AC

## 2015-02-05 MED ORDER — NITROFURANTOIN MACROCRYSTAL 100 MG PO CAPS: 100.0000 mg | ORAL_CAPSULE | Freq: Two times a day (BID) | ORAL | Status: DC

## 2015-02-05 NOTE — Care Management Note (Signed)
Case Management Note  Patient Details  Name: Anna Mccoy MRN: [id number] Date of Birth: 1924-11-06  Subjective/Objective:                    Action/Plan:  Met with patient and daughter to discuss discharge needs. Patient was previously set up with [facility] by previous CM.  This CM verified with patient and daughter that they wished to use [facility].  [name] with [facility] was notified that patient will be discharging home today.  Patient's daughter states that she prefers to pick up a shower bench at the [facility] [employer] retail store after discharge. Expected Discharge Date:                  Expected Discharge Plan:  [name] (Lives at home with daughter)  In-House Referral:     Discharge planning Services  CM Consult  Post Acute Care Choice:    Choice offered to:  Patient  DME Arranged:    DME Agency:  [place]. (Patient's daughter prefers to pick up DME at the store at a later time.)  HH Arranged:  PT, OT [facility] Agency:  [place]  Status of Service:  Completed, signed off  Medicare Important Message Given:  Yes Date Medicare IM Given:  02/05/15 Medicare IM give by:  Lorne Skeens RN, MSN, CM Date Additional Medicare IM Given:    Additional Medicare Important Message give by:     If discussed at [place] of Stay Meetings, dates discussed:    Additional Comments:  Rolm Baptise, RN 02/05/2015, 1:51 PM

## 2015-02-05 NOTE — Evaluation (Signed)
Speech Language Pathology Evaluation Patient Details Name: Anna Mccoy MRN: [id number] DOB: [birth year] Today's Date: 02/05/2015 Time: 5465-6812 SLP Time Calculation (min) (ACUTE ONLY): 22 min  Problem List:  Patient Active Problem List   Diagnosis Date Noted  . UTI (lower urinary tract infection)   . Focal seizure   . CVA (cerebral infarction) 02/02/2015  . Acute CVA (cerebrovascular accident) 02/02/2015  . Arthritis 02/02/2015  . Headache 02/02/2015  . Essential hypertension 02/02/2015  . Hypothyroidism 02/02/2015  . Cerebral infarction due to unspecified mechanism   . Uterovaginal prolapse, complete 04/18/2013   Past Medical History:  Past Medical History  Diagnosis Date  . Abnormal EKG   . Hypertension   . Thyroid disease     HYPOTHYROIDISM  . Rheumatoid arthritis(714.0)   . Hx-TIA (transient ischemic attack)   . Chest pain     H/O  . Fatigue   . RBBB   . Aortic valve sclerosis   . Mitral valve regurgitation     TRIVIAL  . Tricuspid valve regurgitation     MILD  . Procidentia of uterus     uses pessary  . Menopausal state age 47  . Vitamin D deficiency disease   . DDD (degenerative disc disease), lumbar   . Post-menopausal bleeding 02/2011    Endo Biopsy 7/12 benign, no hyperplasia  . Arthritis    Past Surgical History:  Past Surgical History  Procedure Laterality Date  . Cholecystectomy, laparoscopic  2002  . Laminectomy and microdiscectomy lumbar spine  01/23/09  . Colonoscopy w/ biopsies  6/09    sigmoid diverticuli recheck 5 years  . Vaginal delivery      x5  . Eye surgery  04/24/14    Cataract sx in right eye with lens replacement.   HPI:  81 y.o. female    Assessment / Plan / Recommendation Clinical Impression   Pt intelligible with complex conversational tasks; auditory comprehension for complex directives and paragraph retention and functional tasks [facility]; aware of safety precautions, expressive communication WDL without naming  difficulty/anomia present, pt Ox4 and cognitive skills appear WFL as well with all areas of cognition; daughter stated she feels her Mother has returned to baseline functioning as she was unable to communicate effectively for a period of timed/t experiencing seizures 2 days prior to this date. No ST recommended at this time. CT negative, but MR head on 02/02/15 indicated small left posterior frontal and parietal ischemic infarcts and small left occipital subdural hemorrhage, likely related to fall 1 week prior.    SLP Assessment  Patient does not need any further Speech Language Pathology Services    Follow Up Recommendations  None    Frequency and Duration   n/a     Pertinent Vitals/Pain Pain Assessment: 0-10 Pain Score: 2  Pain Location:  (left side headache) Pain Descriptors / Indicators: Aching;Dull Pain Intervention(s): Monitored during session   SLP Goals   n/a  SLP Evaluation Prior Functioning  Cognitive/Linguistic Baseline: Within functional limits Type of Home: House  Lives With: Family Available Help at Discharge: Family;Friend(s);Available 24 hours/day Education:  Psychologist, forensic) Vocation: Retired   IT consultant  Overall Cognitive Status: Within Systems developer for tasks assessed Arousal/Alertness: Awake/alert Orientation Level: Oriented X4 Memory: Appears intact Awareness: Appears intact Problem Solving: Appears intact Safety/Judgment: Appears intact    Comprehension  Auditory Comprehension Overall Auditory Comprehension: Appears within functional limits for tasks assessed Conversation: Complex Visual Recognition/Discrimination Discrimination: Not tested Reading Comprehension Reading Status: Not tested    Expression  Expression Primary Mode of Expression: Verbal Verbal Expression Overall Verbal Expression: Appears within functional limits for tasks assessed Initiation: No impairment Level of Generative/Spontaneous Verbalization:  Conversation Repetition: No impairment Naming: No impairment Pragmatics: No impairment Non-Verbal Means of Communication: Not applicable Written Expression Dominant Hand: Right Written Expression: Within Functional Limits   Oral / Motor Oral Motor/Sensory Function Overall Oral Motor/Sensory Function: Appears within functional limits for tasks assessed Motor Speech Overall Motor Speech: Appears within functional limits for tasks assessed Respiration: Within functional limits Phonation: Normal Resonance: Within functional limits Articulation: Within functional limitis Intelligibility: Intelligible Motor Planning: Witnin functional limits Motor Speech Errors: Not applicable        Ashanti Ratti,PAT, M.S., CCC-SLP 02/05/2015, 11:58 AM

## 2015-02-05 NOTE — Discharge Summary (Addendum)
Physician Discharge Summary  Anna Mccoy SPQ:[id number] DOB: 1924-11-10 DOA: 02/02/2015  PCP: Woody Seller, MD  Admit date: 02/02/2015 Discharge date: 02/05/2015  Time spent: 45 minutes  Recommendations for Outpatient Follow-up:  1. Needs OP neurology input if HA persists 2. . to meds as below  Atorvastatin  10 ? to 40 mg daily  D/c HCTZ/ARB  Added Depakote 250 bid this admit                              3. TSH 1 mo 4. Needs OP Rheum f/u 5. Follow urine culture-D/c home on MACRODANTIN 100 bid till then 6. Consider Bmet and cbc 1 week  7. Consider referral to a Headache clinic 8. HH walker and tub ordered as OP 9. HHPT oredered for patient  Discharge Diagnoses:  Principal Problem:   Acute CVA (cerebrovascular accident) Active Problems:   CVA (cerebral infarction)   Arthritis   Headache   Essential hypertension   Hypothyroidism   Focal seizure   UTI (lower urinary tract infection)   Discharge Condition: stable  Diet recommendation: HH low salt  Filed Weights   02/03/15 1045  Weight: 50.803 kg (112 lb)    History of present illness:  81 y.o. female with a history of hypertension, hypothyroidism, history of irregular heartbeat, but presented to the emergency department with complaints of headache.headaches have been ongoing for approximately 1 week and are intermittent, stabbing pain noted on the left side of her face down to her maxillary region and to her ear.  Patient states these episodes last up to 5 minutes and then go away.  She has taken Tylenol which has helped her pain. She denied any photophobia or phonophobia, nausea, vomiting. Patient denies any visual changes or decreased hearing.  Patient had presented to [facility] [place] 2 days PTA, CT of the head was negative.  Patient was sent home and told to follow-up with her primary care doctor. She presented again to the ER with complaints of headache.  Neurology was consulted and recommended MRI  of the brain which did show acute CVA SHe had new onset Sz x 2 6/22 EEG performed was abnormal but consistent c H/o infarcts  Hospital Course:  New onset Seizure -Patient had 2 witnessed seizures. Ativan $RemoveBeforeDE'2mg'TwfDJftrTsFwjpw$  ordered and given.  -Currently maintaining her airway (Patient is a DNR) -Was initially transferred to stepdown.  -Neurology consulted and appreciated -EEG: Abnormal electroencephalogram secondary to left temporal slowing and rare left temporal sharp transients, consistent with left hemispheric acute infarcts on MRI. -Patient was started on depakote 250 bid this admit -CXR negative for infection -UA: WBC TNTC, many bacteria, large leukocytes -Urine culture  from Ceftriaxone to Macrodantin on d/c -cultures to be followed up as OP-Currently afebrile, no leukocytosis  Acute CVA -CT head on 01/31/2015: No acute intracranial pathology -MRI brain: Subcentimeter foci of restricted diffusion in the left posterior frontal and parietal cortex, multiple small infacrs -MRI brain with contrast: Smooth pleural thickening and enhancement likely reactive to left subdural collection. -Risk factors: age, irregular heartbeat, HTN - carotid doppler negative for iany significant stenosis -Echocardiogram: EF 55-60%, no defect or patent foramen ovale identified. -Hemoglobin A1c 5.3, Lipid panel 120 -PT/OT consulted- recommended home health -Neurology consulted and appreciated -Continue aspirin  -Spoke with Dr. Leonie Man regarding restarting plavix, given SDH. Ok to restart.   Subdural hematoma -Received a call from radiology, stating patient had 29mm left subdural hematoma. -Possible related  to her fall a week ago.  Headache -Unlikely related to acute CVA -Pending further recommendations from neurology- thought to be trigeminal neuralgia vs temporal arteritis -Headache affecting left side -Continue tylenol -Neurology initially recommended carbmazepine and prednisone. Spoke with Dr. Cyril Mourning this  morning, patient may need temporal artery biopsy. -Spoke with Dr. Pearlean Brownie, depakote should help with headaches, no need for biopsy -CRP 5.9, ESR 81  Hypothyroidism -Continue synthroid  Questionable history of atrial fibrillation -per patient, she has a history of "irregular heartbeat" but is only on plavix/Aspirin -Continue telemetry monitoring-shows SR -echocardiogram as above  Hypertension -Will allow for permissive HTN -Hold amlodipine and diovan  Procedures: Multiple as above Consultations:  Neurology  Discharge Exam: Filed Vitals:   02/05/15 0638  BP: 151/76  Pulse: 65  Temp: 98 F (36.7 C)  Resp: 18    General: EOMI, NCAT Vision by direct confrontation intact, smile symm, tongue midline Cardiovascular:  s1 s2 no m/r/g Respiratory: clear no added sound Gait intact, power 5/5   Discharge Instructions    Current Discharge Medication List    START taking these medications   Details  atorvastatin (LIPITOR) 40 MG tablet Take 1 tablet (40 mg total) by mouth daily at 6 PM. Qty: 30 tablet, Refills: 0    divalproex (DEPAKOTE) 250 MG DR tablet Take 1 tablet (250 mg total) by mouth every 12 (twelve) hours. Qty: 60 tablet, Refills: 0    nitrofurantoin (MACRODANTIN) 100 MG capsule Take 1 capsule (100 mg total) by mouth 2 (two) times daily. Qty: 6 capsule, Refills: 0      CONTINUE these medications which have NOT CHANGED   Details  abatacept (ORENCIA) 250 MG injection Inject into the vein every 30 (thirty) days.     acetaminophen (TYLENOL) 500 MG tablet Take 500 mg by mouth as needed for mild pain.     amLODipine (NORVASC) 10 MG tablet Take 10 mg by mouth daily.    aspirin 81 MG tablet Take 81 mg by mouth every 3 (three) days.     clopidogrel (PLAVIX) 75 MG tablet Take 75 mg by mouth daily with breakfast.    folic acid (FOLVITE) 1 MG tablet Take 1 mg by mouth daily.    IRON PO Take 1 tablet by mouth daily.    leflunomide (ARAVA) 20 MG tablet Take 20 mg by  mouth every other day.    levothyroxine (SYNTHROID, LEVOTHROID) 75 MCG tablet Take 75 mcg by mouth daily before breakfast.    Polyethyl Glycol-Propyl Glycol (SYSTANE OP) Place 1 drop into both eyes 4 (four) times daily.    potassium chloride SA (K-DUR,KLOR-CON) 20 MEQ tablet Take 20 mEq by mouth 2 (two) times daily.    prednisoLONE acetate (PRED FORTE) 1 % ophthalmic suspension Place 1 drop into the right eye 3 (three) times daily. Refills: 1    PREMARIN vaginal cream USE VAGINALLY 3 TIMES WEEKLY Qty: 42.5 g, Refills: 11    valsartan-hydrochlorothiazide (DIOVAN-HCT) 160-25 MG per tablet Take 1 tablet by mouth daily.    Vitamin D, Ergocalciferol, (DRISDOL) 50000 UNITS CAPS Take 50,000 Units by mouth every 14 (fourteen) days.      STOP taking these medications     Cod Liver Oil 10 MINIM CAPS        Allergies  Allergen Reactions  . Ciprofloxacin Diarrhea  . Lidocaine   . Neosporin [Neomycin-Bacitracin Zn-Polymyx]   . Sulfa Antibiotics    Follow-up Information    Follow up with SETHI,PRAMOD, MD In 2 months.  Specialties:  Neurology, Radiology   Why:  stroke clinic, office will call you for follow up appointment, call earlier if symptoms worsen or new neurologica   Contact information:   [address] [place] [address] [phone]        The results of significant diagnostics from this hospitalization (including imaging, microbiology, ancillary and laboratory) are listed below for reference.    Significant Diagnostic Studies: Dg Chest 2 View  02/02/2015   CLINICAL DATA:  Acute CVA  EXAM: CHEST  2 VIEW  COMPARISON:  01/14/2009  FINDINGS: Lungs are clear.  No pleural effusion or pneumothorax.  The heart is normal in size.  Mild degenerative changes of the visualized thoracolumbar spine.  IMPRESSION: No evidence of acute cardiopulmonary disease.   Electronically Signed   By: Julian Hy M.D.   On: 02/02/2015 19:02   Ct Head Wo Contrast  01/31/2015    CLINICAL DATA:  Acute onset of left ear pain and dizziness. Initial encounter.  EXAM: CT HEAD WITHOUT CONTRAST  TECHNIQUE: Contiguous axial images were obtained from the base of the skull through the vertex without intravenous contrast.  COMPARISON:  None.  FINDINGS: There is no evidence of acute infarction, mass lesion, or intra- or extra-axial hemorrhage on CT.  Prominence of the ventricles and sulci reflects mild to moderate cortical volume loss. Mild cerebellar atrophy is noted. Scattered periventricular and subcortical white matter change likely reflects small vessel ischemic microangiopathy.  The brainstem and fourth ventricle are within normal limits. The basal ganglia are unremarkable in appearance. The cerebral hemispheres demonstrate grossly normal gray-white differentiation. No mass effect or midline shift is seen.  There is no evidence of fracture; visualized osseous structures are unremarkable in appearance. The visualized portions of the orbits are within normal limits. The paranasal sinuses and mastoid air cells are well-aerated. No significant soft tissue abnormalities are seen.  IMPRESSION: 1. No acute intracranial pathology seen on CT. 2. Mild to moderate cortical volume loss and scattered small vessel ischemic microangiopathy. 3. The left external auditory canal and inner ear are grossly unremarkable in appearance. The left mastoid air cells are well-aerated.   Electronically Signed   By: Garald Balding M.D.   On: 01/31/2015 22:50   Mr Virgel Paling MH Contrast  02/02/2015   ADDENDUM REPORT: 02/02/2015 18:42  ADDENDUM: In the LEFT occipital region, there is a 3 mm thick isointense fluid collection on FLAIR imaging, trace susceptibility on gradient sequence, not visible on CT, suggesting a small subacute subdural hematoma or hygroma. This is remote from the small acute infarcts. No history of trauma or anticoagulation. Significance uncertain as the patient's headache is reportedly maxillary. Findings  discussed with Dr. Ree Kida at time of addendum.   Electronically Signed   By: Staci Righter M.D.   On: 02/02/2015 18:42   02/02/2015   CLINICAL DATA:  Patient presents to the emergency department with headache that sound going for the last week. The patient describes a headache that is intermittent in nature and lasts for a short period of time is mainly left-sided, radiates into her ear. Patient describes it as a sharp sensation. She denies having any visual changes, nausea, vomiting, weakness, dizziness, neck pain, chest pain, shortness of breath, fever, lightheadedness or syncope. Patient states nothing seems make her condition better or worse. The patient has not taken any medications at home for her headache. The patient was seen in the emergency department 2 nights ago. The daughter was insistent that she come back  to the emergency department for further evaluation. History of hypertension. History of cardiac arrhythmia  EXAM: MRI HEAD WITHOUT CONTRAST  MRA HEAD WITHOUT CONTRAST a are  TECHNIQUE: Multiplanar, multiecho pulse sequences of the brain and surrounding structures were obtained without intravenous contrast. Angiographic images of the head were obtained using MRA technique without contrast.  COMPARISON:  CT head 01/31/15.  FINDINGS: MRI HEAD FINDINGS  Subcentimeter areas of acute infarction affect the LEFT posterior frontal and parietal cortex near the vertex.  No hemorrhage, mass lesion, hydrocephalus, or extra-axial fluid. Generalized atrophy. Moderately advanced chronic microvascular ischemic change. Flow voids are preserved. No midline abnormality. No osseous findings. Extracranial soft tissues unremarkable. Moderate retention cyst formation RIGHT maxillary sinus. Negative orbits.  Compared with prior CT, the infarcts are not visible.  MRA HEAD FINDINGS  Dolichoectatic but widely patent internal carotid arteries. Basilar artery widely patent with both vertebrals contributing. New no proximal  stenosis of the anterior, middle, or posterior cerebral arteries. No MCA branch occlusion. No intracranial aneurysm.  IMPRESSION: Subcentimeter foci of restricted diffusion in the LEFT posterior frontal and parietal cortex consistent with multiple small infarcts. These are nonhemorrhagic. See discussion above.  Atrophy and small vessel disease.  No proximal flow reducing lesion is evident.  Electronically Signed: By: Staci Righter M.D. On: 02/02/2015 15:54   Mr Brain Wo Contrast  02/02/2015   ADDENDUM REPORT: 02/02/2015 18:42  ADDENDUM: In the LEFT occipital region, there is a 3 mm thick isointense fluid collection on FLAIR imaging, trace susceptibility on gradient sequence, not visible on CT, suggesting a small subacute subdural hematoma or hygroma. This is remote from the small acute infarcts. No history of trauma or anticoagulation. Significance uncertain as the patient's headache is reportedly maxillary. Findings discussed with Dr. Ree Kida at time of addendum.   Electronically Signed   By: Staci Righter M.D.   On: 02/02/2015 18:42   02/02/2015   CLINICAL DATA:  Patient presents to the emergency department with headache that sound going for the last week. The patient describes a headache that is intermittent in nature and lasts for a short period of time is mainly left-sided, radiates into her ear. Patient describes it as a sharp sensation. She denies having any visual changes, nausea, vomiting, weakness, dizziness, neck pain, chest pain, shortness of breath, fever, lightheadedness or syncope. Patient states nothing seems make her condition better or worse. The patient has not taken any medications at home for her headache. The patient was seen in the emergency department 2 nights ago. The daughter was insistent that she come back to the emergency department for further evaluation. History of hypertension. History of cardiac arrhythmia  EXAM: MRI HEAD WITHOUT CONTRAST  MRA HEAD WITHOUT CONTRAST a are   TECHNIQUE: Multiplanar, multiecho pulse sequences of the brain and surrounding structures were obtained without intravenous contrast. Angiographic images of the head were obtained using MRA technique without contrast.  COMPARISON:  CT head 01/31/15.  FINDINGS: MRI HEAD FINDINGS  Subcentimeter areas of acute infarction affect the LEFT posterior frontal and parietal cortex near the vertex.  No hemorrhage, mass lesion, hydrocephalus, or extra-axial fluid. Generalized atrophy. Moderately advanced chronic microvascular ischemic change. Flow voids are preserved. No midline abnormality. No osseous findings. Extracranial soft tissues unremarkable. Moderate retention cyst formation RIGHT maxillary sinus. Negative orbits.  Compared with prior CT, the infarcts are not visible.  MRA HEAD FINDINGS  Dolichoectatic but widely patent internal carotid arteries. Basilar artery widely patent with both vertebrals contributing. New no proximal stenosis of  the anterior, middle, or posterior cerebral arteries. No MCA branch occlusion. No intracranial aneurysm.  IMPRESSION: Subcentimeter foci of restricted diffusion in the LEFT posterior frontal and parietal cortex consistent with multiple small infarcts. These are nonhemorrhagic. See discussion above.  Atrophy and small vessel disease.  No proximal flow reducing lesion is evident.  Electronically Signed: By: Staci Righter M.D. On: 02/02/2015 15:54   Mr Jeri Cos Contrast  02/04/2015   CLINICAL DATA:  81 year old female with history of hypertension, hypothyroidism, found to have small left posterior frontal and parietal ischemic infarcts and small left occipital subdural hemorrhage, likely related to fall 1 week prior. Post-contrast MRI performed for further evaluation of this collection, whether this represents meningeal thickening versus hemorrhage.  EXAM: MRI HEAD WITH CONTRAST  TECHNIQUE: Multiplanar, multiecho pulse sequences of the brain and surrounding structures were obtained with  intravenous contrast.  COMPARISON:  None.  CONTRAST:  39mL MULTIHANCE GADOBENATE DIMEGLUMINE 529 MG/ML IV SOLN  FINDINGS: Post-contrast imaging of the brain demonstrates mild diffuse asymmetric dural/pachymeningeal thickening and enhancement overlying the left cerebral hemisphere. There is relative sparing overlying the left anterior frontal lobe. This enhancement is fairly smooth and even throughout. While there is dural enhancement at the left occipital lobe, the previously identified small left occipital collection does not appear to enhance entirely as would be expected with meningeal thickening. A small subdural collection is again seen overlying the left cerebral convexity, best appreciated on coronal T2 weighted sequence. This measures approximately 2 mm in maximal thickness. No midline shift or mass effect.  No other abnormal enhancement within the brain.  IMPRESSION: Smooth dural thickening and enhancement overlying the left cerebral hemisphere as above. This is felt to most likely be reactive in nature due to the presence of the left subdural collection as identified on previous MRI. The previously identified left occipital collection itself does not enhance, and most likely reflects a small subacute subdural hemorrhage related to recent fall/ trauma.   Electronically Signed   By: Jeannine Boga M.D.   On: 02/04/2015 01:26    Microbiology: Recent Results (from the past 240 hour(s))  MRSA PCR Screening     Status: None   Collection Time: 02/03/15 10:42 AM  Result Value Ref Range Status   MRSA by PCR NEGATIVE NEGATIVE Final    Comment:        The GeneXpert MRSA Assay (FDA approved for NASAL specimens only), is one component of a comprehensive MRSA colonization surveillance program. It is not intended to diagnose MRSA infection nor to guide or monitor treatment for MRSA infections.   Culture, blood (routine x 2)     Status: None (Preliminary result)   Collection Time: 02/03/15  2:00  PM  Result Value Ref Range Status   Specimen Description BLOOD RIGHT HAND  Final   Special Requests BOTTLES DRAWN AEROBIC AND ANAEROBIC 10CC  Final   Culture NO GROWTH < 24 HOURS  Final   Report Status PENDING  Incomplete  Culture, blood (routine x 2)     Status: None (Preliminary result)   Collection Time: 02/03/15  2:08 PM  Result Value Ref Range Status   Specimen Description BLOOD LEFT HAND  Final   Special Requests BOTTLES DRAWN AEROBIC AND ANAEROBIC 10CC  Final   Culture NO GROWTH < 24 HOURS  Final   Report Status PENDING  Incomplete     Labs: Basic Metabolic Panel:  Recent Labs Lab 02/02/15 1013 02/03/15 0855 02/04/15 0245 02/05/15 0404  NA 139 140 141  139  K 3.8 4.3 5.0 4.5  CL 104 106 108 107  CO2 $Re'23 25 26 25  'oOJ$ GLUCOSE 96 190* 118* 82  BUN $Re'12 14 17 15  'hth$ CREATININE 0.83 1.01* 0.94 0.82  CALCIUM 9.0 9.0 8.6* 8.1*   Liver Function Tests: No results for input(s): AST, ALT, ALKPHOS, BILITOT, PROT, ALBUMIN in the last 168 hours. No results for input(s): LIPASE, AMYLASE in the last 168 hours. No results for input(s): AMMONIA in the last 168 hours. CBC:  Recent Labs Lab 02/02/15 1013 02/03/15 0855 02/04/15 0245 02/05/15 0404  WBC 8.6 6.0 10.9* 11.0*  NEUTROABS 5.5  --   --   --   HGB 11.7* 11.3* 10.2* 10.4*  HCT 35.1* 33.8* 31.1* 31.4*  MCV 91.9 91.4 91.2 91.0  PLT 213 247 254 267   Cardiac Enzymes: No results for input(s): CKTOTAL, CKMB, CKMBINDEX, TROPONINI in the last 168 hours. BNP: BNP (last 3 results) No results for input(s): BNP in the last 8760 hours.  ProBNP (last 3 results) No results for input(s): PROBNP in the last 8760 hours.  CBG: No results for input(s): GLUCAP in the last 168 hours.     SignedNita Sells  [facility] 02/05/2015, 10:06 AM

## 2015-02-08 ENCOUNTER — Telehealth: Payer: Self-pay | Admitting: Neurology

## 2015-02-08 ENCOUNTER — Other Ambulatory Visit: Payer: Self-pay

## 2015-02-08 DIAGNOSIS — I10 Essential (primary) hypertension: Secondary | ICD-10-CM | POA: Diagnosis not present

## 2015-02-08 DIAGNOSIS — E119 Type 2 diabetes mellitus without complications: Secondary | ICD-10-CM | POA: Diagnosis not present

## 2015-02-08 DIAGNOSIS — W19XXXD Unspecified fall, subsequent encounter: Secondary | ICD-10-CM | POA: Diagnosis not present

## 2015-02-08 DIAGNOSIS — G40909 Epilepsy, unspecified, not intractable, without status epilepticus: Secondary | ICD-10-CM | POA: Diagnosis not present

## 2015-02-08 DIAGNOSIS — I69398 Other sequelae of cerebral infarction: Secondary | ICD-10-CM | POA: Diagnosis not present

## 2015-02-08 DIAGNOSIS — R2689 Other abnormalities of gait and mobility: Secondary | ICD-10-CM | POA: Diagnosis not present

## 2015-02-08 DIAGNOSIS — A499 Bacterial infection, unspecified: Secondary | ICD-10-CM | POA: Diagnosis not present

## 2015-02-08 DIAGNOSIS — S066X0D Traumatic subarachnoid hemorrhage without loss of consciousness, subsequent encounter: Secondary | ICD-10-CM | POA: Diagnosis not present

## 2015-02-08 DIAGNOSIS — M069 Rheumatoid arthritis, unspecified: Secondary | ICD-10-CM | POA: Diagnosis not present

## 2015-02-08 DIAGNOSIS — N390 Urinary tract infection, site not specified: Secondary | ICD-10-CM | POA: Diagnosis not present

## 2015-02-08 LAB — CULTURE, BLOOD (ROUTINE X 2)
CULTURE: NO GROWTH
Culture: NO GROWTH

## 2015-02-08 NOTE — Telephone Encounter (Signed)
Thank you :)

## 2015-02-08 NOTE — Telephone Encounter (Signed)
I spoke to the patient's daughter. She is concerned about patient having increasing confusion, vision difficulties and right-sided weakness following her seizures. Her headaches seem to be better and she has not had any recurrent seizures since starting Depakote. I advised her to keep coming visit with Dr. Lucia Gaskins and if her symptoms persist she may need follow-up MRI scan

## 2015-02-08 NOTE — Telephone Encounter (Signed)
Patient's daughter is requesting to speak with Dr. Pearlean Brownie regarding her recent stroke admission. Unfortunately I did not see patient while she was admitted so I cannot answer her questions. Patient's daughter would like Dr. Pearlean Brownie to call her at [phone]. I am seeing patient this Thursday for headache however Patient's daughter is insisting she speak with Dr. Pearlean Brownie about questions regarding her inpatient admission and patient's strokes.    Mother was in the hospital under [facility] care. They got home this past Friday and she is having coordination issues, reaching out for objects and thinks she is grabbing them but isn't. Her vision is blurry since she has been home. Vision is worsening, she can't recognize people in pictures on the mantle because she can't see them clearly anymore. She is more forgetful since being home. Patient is asking the same questions. She is getting more things mixed up but not altered in any way. Daughter would like to share these issues with Dr. Pearlean Brownie and discuss them, wondering if she has had more strokes. She  asked me to relay the information to him. Dr. Pearlean Brownie is in the hospital this week and I informed her I would let him know.   I highly encouraged her to bring patient back to ED should she have acute worsening.

## 2015-02-08 NOTE — Telephone Encounter (Signed)
Patient's daughter is calling with concerns about her mother who had a stroke. She is having vision problems, trouble remembering, coordination.  Please call.

## 2015-02-08 NOTE — Telephone Encounter (Signed)
Molinda Bailiff for you as well. Dr Pearlean Brownie sent his reply to me and Dr Lucia Gaskins.  Thank you, Ambrose Pancoast

## 2015-02-10 DIAGNOSIS — G40909 Epilepsy, unspecified, not intractable, without status epilepticus: Secondary | ICD-10-CM | POA: Diagnosis not present

## 2015-02-10 DIAGNOSIS — N390 Urinary tract infection, site not specified: Secondary | ICD-10-CM | POA: Diagnosis not present

## 2015-02-10 DIAGNOSIS — I69398 Other sequelae of cerebral infarction: Secondary | ICD-10-CM | POA: Diagnosis not present

## 2015-02-10 DIAGNOSIS — S066X0D Traumatic subarachnoid hemorrhage without loss of consciousness, subsequent encounter: Secondary | ICD-10-CM | POA: Diagnosis not present

## 2015-02-10 DIAGNOSIS — R2689 Other abnormalities of gait and mobility: Secondary | ICD-10-CM | POA: Diagnosis not present

## 2015-02-10 DIAGNOSIS — A499 Bacterial infection, unspecified: Secondary | ICD-10-CM | POA: Diagnosis not present

## 2015-02-11 ENCOUNTER — Ambulatory Visit (INDEPENDENT_AMBULATORY_CARE_PROVIDER_SITE_OTHER): Payer: Medicare Other | Admitting: Neurology

## 2015-02-11 ENCOUNTER — Encounter: Payer: Self-pay | Admitting: Neurology

## 2015-02-11 VITALS — BP 164/85 | HR 71 | Temp 97.5°F | Ht 64.0 in | Wt 112.4 lb

## 2015-02-11 DIAGNOSIS — I6200 Nontraumatic subdural hemorrhage, unspecified: Secondary | ICD-10-CM

## 2015-02-11 DIAGNOSIS — F05 Delirium due to known physiological condition: Secondary | ICD-10-CM

## 2015-02-11 DIAGNOSIS — S065X9A Traumatic subdural hemorrhage with loss of consciousness of unspecified duration, initial encounter: Secondary | ICD-10-CM

## 2015-02-11 DIAGNOSIS — H538 Other visual disturbances: Secondary | ICD-10-CM | POA: Diagnosis not present

## 2015-02-11 DIAGNOSIS — S065XAA Traumatic subdural hemorrhage with loss of consciousness status unknown, initial encounter: Secondary | ICD-10-CM

## 2015-02-11 DIAGNOSIS — I639 Cerebral infarction, unspecified: Secondary | ICD-10-CM

## 2015-02-11 DIAGNOSIS — R410 Disorientation, unspecified: Secondary | ICD-10-CM

## 2015-02-11 NOTE — Progress Notes (Signed)
[facility]    Provider:  Dr Lucia Gaskins Referring Provider: [name], MD Primary Care Physician:  Pamelia Hoit, MD  CC:  Stroke  HPI:  Anna Mccoy is a 81 y.o. female here as a referral from Dr. Andrey Campanile for follow up of stroke.  She has a PMHx of LBP, lumbar spinal stenosis s/p surgery 2010, right foot numbness, HTN, hypothyroidism, RA who presented to ED on 01/22/2015 complaining of left sided temporal and maxillary stabbing and lancinating pain. She was on Plavix daily and aspirin 81 mg every 3 days. Neurologic exam nonfocal. MRI of the brain demonstrated areas of acute infarction in the left posterior frontal and parietal cortex areas. Also showed small left occipital subdural hemorrhage. She was admitted to [facility] for stroke workup. The headache was acute in onset and be going on for about a week. Symptoms were described as stabbing and intermittent without photophobia, phonophobia, nausea, vomiting, visual changes, or autonomic features.  She is here with her 2 daughters who provide most of the information. She feels she is doing well. Not back to her baseline but doing better. She is taking care of herself, daughter taking good care of her and she is at peace. She has no had a headache since leaving the hospital. She sometimes has some difficulty explaining things, she gets off track but no confusional spells. She may ask the same questions twice such as about medicine but patient says she just wants to make sure she takes it correctly. She lives wither two daughters and her sister that is 67. No more seizures or seizure like episodes. No aphasia. Her vision is not as clear as it was prior to the stroke. She had cataract removal in May and improved but worsened after the stroke. She notes that this morning she was able to read better and she was able to read fine print and she is very happy about that. She had PT yesterday and she is doing very well, and she  is going to start occupational Therapy. She is taking the Depakote and is not having any side effects. She is taking aspirin every 3 days (should be taking every day). She is still on the Plavix.   Per daughters: They got home this past Friday and she is having coordination issues, reaching out for objects and thinks she is grabbing them but isn't. Her vision is blurry since she has been home. Vision is worsening, she can't recognize people in pictures on the mantle because she can't see them clearly anymore. She is more forgetful since being home. Patient is asking the same questions. She is getting more things mixed up but not altered in any way. Daughters are worried she has had a repeat stroke, the symptoms started happening after the last MRI of the brain.  Reviewed notes, labs and imaging from outside physicians, which showed: She presented to the ED on 01/22/2015 complaining of left sided temporal and maxillary stabbing and lancinating pain. She was on Plavix daily and aspirin 81 mg every 3 days. Neurologic exam nonfocal. MRI of the brain demonstrated areas of acute infarction in the left posterior frontal and parietal cortex areas. Also showed left occipital subdural hemorrhage unclear if it was secondary to a fall 1 week prior versus meningeal thickening secondary to rheumatoid arthritis. The left posterior frontal and parietal multiple small infarcts were thought to be secondary to small vessel disease versus rheumatoid vasculitis(personally reviewed images). MRA was unremarkable. She was admitted to [facility] for stroke workup. While inpatient, patient had a seizure with right leg and hand shaking and expressive aphasia. She was started on Depakote with good results for both the seizures and the headache.  LDL was 120, statin was started hgba1c 5.5 Cbc with mild anemia Bmp with slightlky elevated gfr  EEG: This is an abnormal electroencephalogram secondary to left temporal slowing and rare  left temporal sharp transients. This is consistent with the patient's history of left hemispheric acute infarcts seen on MRI.   MRI w/contrast: IMPRESSION: Smooth dural thickening and enhancement overlying the left cerebral hemisphere as above. This is felt to most likely be reactive in nature due to the presence of the left subdural collection as identified on previous MRI. The previously identified left occipital collection itself does not enhance, and most likely reflects a small subacute subdural hemorrhage related to recent fall/ trauma.  MRI HEAD FINDINGS WO CONTRAST  Subcentimeter areas of acute infarction affect the LEFT posterior frontal and parietal cortex near the vertex.  No hemorrhage, mass lesion, hydrocephalus, or extra-axial fluid. Generalized atrophy. Moderately advanced chronic microvascular ischemic change. Flow voids are preserved. No midline abnormality. No osseous findings. Extracranial soft tissues unremarkable. Moderate retention cyst formation RIGHT maxillary sinus. Negative orbits.  Compared with prior CT, the infarcts are not visible.  MRA HEAD FINDINGS  Dolichoectatic but widely patent internal carotid arteries. Basilar artery widely patent with both vertebrals contributing. New no proximal stenosis of the anterior, middle, or posterior cerebral arteries. No MCA branch occlusion. No intracranial aneurysm.  IMPRESSION: Subcentimeter foci of restricted diffusion in the LEFT posterior frontal and parietal cortex consistent with multiple small infarcts. These are nonhemorrhagic. See discussion above.  Atrophy and small vessel disease.  No proximal flow reducing lesion is evident.  Echo: The cavity size was normal. Systolic function was normal. The estimated ejection fraction was in the range of 55% to 60%. Wall motion was normal; there were no regional wall motion abnormalities.  Carotids: Findings suggest 1-39% internal carotid  artery stenosis bilaterally. Vertebral arteries are patent with antegrade flow.  Review of Systems: Patient complains of symptoms per HPI as well as the following symptoms: No CP, no SOB, No fevers, no chills, no abdominal pain. Pertinent negatives per HPI. All others negative.   History   Social History  . Marital Status: Widowed    Spouse Name: N/A  . Number of Children: 5  . Years of Education: 12   Occupational History  . Retired     Social History Main Topics  . Smoking status: Never Smoker   . Smokeless tobacco: Never Used  . Alcohol Use: No  . Drug Use: No  . Sexual Activity: No     Comment: Widowed 01/10/07 age 64   Other Topics Concern  . Not on file   Social History Narrative   Patient is right handed, resides with daughter and sister.    Caffeine use: Coffee (drinks 1/2-1 cup per day)    Family History  Problem Relation Age of Onset  . Hypertension Mother   . Stroke Mother   . Hypertension Sister   . Diabetes Sister   . Hypertension Sister   . Diabetes Sister     Past Medical History  Diagnosis Date  . Abnormal EKG   . Hypertension   . Thyroid disease     HYPOTHYROIDISM  . Rheumatoid arthritis(714.0)   . Hx-TIA (transient ischemic attack)   . Chest pain     H/O  . Fatigue   .  RBBB   . Aortic valve sclerosis   . Mitral valve regurgitation     TRIVIAL  . Tricuspid valve regurgitation     MILD  . Procidentia of uterus     uses pessary  . Menopausal state age 72  . Vitamin D deficiency disease   . DDD (degenerative disc disease), lumbar   . Post-menopausal bleeding 02/2011    Endo Biopsy 7/12 benign, no hyperplasia  . Arthritis     Past Surgical History  Procedure Laterality Date  . Cholecystectomy, laparoscopic  2002  . Laminectomy and microdiscectomy lumbar spine  01/23/09  . Colonoscopy w/ biopsies  6/09    sigmoid diverticuli recheck 5 years  . Vaginal delivery      x5  . Eye surgery  04/24/14    Cataract sx in right eye with  lens replacement.    Current Outpatient Prescriptions  Medication Sig Dispense Refill  . acetaminophen (TYLENOL) 500 MG tablet Take 500 mg by mouth as needed for mild pain.     Marland Kitchen amLODipine (NORVASC) 10 MG tablet Take 10 mg by mouth daily.    Marland Kitchen aspirin 81 MG tablet Take 81 mg by mouth every 3 (three) days.     Marland Kitchen atorvastatin (LIPITOR) 40 MG tablet Take 1 tablet (40 mg total) by mouth daily at 6 PM. 30 tablet 0  . clopidogrel (PLAVIX) 75 MG tablet Take 75 mg by mouth daily with breakfast.    . divalproex (DEPAKOTE) 250 MG DR tablet Take 1 tablet (250 mg total) by mouth every 12 (twelve) hours. 60 tablet 0  . folic acid (FOLVITE) 1 MG tablet Take 1 mg by mouth daily.    . IRON PO Take 1 tablet by mouth daily.    Marland Kitchen leflunomide (ARAVA) 20 MG tablet Take 20 mg by mouth every other day.    . levothyroxine (SYNTHROID, LEVOTHROID) 75 MCG tablet Take 75 mcg by mouth daily before breakfast.    . nitrofurantoin (MACRODANTIN) 100 MG capsule Take 1 capsule (100 mg total) by mouth 2 (two) times daily. 6 capsule 0  . Polyethyl Glycol-Propyl Glycol (SYSTANE OP) Place 1 drop into both eyes 4 (four) times daily.    . potassium chloride SA (K-DUR,KLOR-CON) 20 MEQ tablet Take 20 mEq by mouth 2 (two) times daily.    . prednisoLONE acetate (PRED FORTE) 1 % ophthalmic suspension Place 1 drop into the right eye 3 (three) times daily.  1  . PREMARIN vaginal cream USE VAGINALLY 3 TIMES WEEKLY (Patient taking differently: USE VAGINALLY 3 TIMES WEEKLY ON MON, WED, FRI) 42.5 g 11  . valsartan-hydrochlorothiazide (DIOVAN-HCT) 160-25 MG per tablet Take 1 tablet by mouth daily.    . Vitamin D, Ergocalciferol, (DRISDOL) 50000 UNITS CAPS Take 50,000 Units by mouth every 14 (fourteen) days.    Marland Kitchen abatacept (ORENCIA) 250 MG injection Inject into the vein every 30 (thirty) days.      No current facility-administered medications for this visit.    Allergies as of 02/11/2015 - Review Complete 02/11/2015  Allergen Reaction Noted   . Ciprofloxacin Diarrhea 06/09/2013  . Lidocaine  11/10/2011  . Neosporin [neomycin-bacitracin zn-polymyx]  12/03/2012  . Sulfa antibiotics  11/10/2011    Vitals: BP 164/85 mmHg  Pulse 71  Temp(Src) 97.5 F (36.4 C) (Oral)  Ht  (1.626 m)  Wt 112 lb 6.4 oz (50.984 kg)  BMI 19.28 kg/m2  LMP 08/14/1981 Last Weight:  Wt Readings from Last 1 Encounters:  02/11/15 112 lb 6.4 oz (50.984  kg)   Last Height:   Ht Readings from Last 1 Encounters:  02/11/15 5\' 4"  (1.626 m)   Physical exam: Exam: Gen: NAD, conversant, well nourised, well groomed                     CV: RRR, no MRG. No Carotid Bruits. No peripheral edema, warm, nontender Eyes: Conjunctivae clear without exudates or hemorrhage  Neuro: Detailed Neurologic Exam  Speech:    Speech is normal; fluent and spontaneous with normal comprehension.  Cognition:    The patient is oriented to person, place, and time;     recent and remote memory appear  intact;     language fluent;     normal attention, concentration,     fund of knowledge appears intact Cranial Nerves:    The pupils are equal, round, and reactive to light. The fundi are flat. Visual fields are full to finger confrontation. Extraocular movements are intact. Trigeminal sensation is intact and the muscles of mastication are normal. The face is symmetric. The palate elevates in the midline. Hearing intact. Voice is normal. Shoulder shrug is normal. The tongue has normal motion without fasciculations.   Coordination:    No dysmetria   Gait:   Uses a walker, no ataxia  Motor Observation:    No asymmetry, no atrophy, and no involuntary movements noted. Tone:    Normal muscle tone.    Posture:    Posture is slightly stooped    Strength:    Strength is V/V in the upper and lower limbs.      Sensation: intact to LT     Reflex Exam:  DTR's:    Deep tendon reflexes in the upper and lower extremities are symmetrical bilaterally.   Toes:    The toes  are downgoing bilaterally.   Clonus:    Clonus is absent.  Assessment/Plan:  Anna Mccoy is a 81 y.o. female with history of hypertension, hypothyroidism and irregular heartbeat presented with headache. Subsequent MRI showed small left occipital subdural hemorrhage, left posterior frontal and parietal multiple small embolic infarcts. Had episode of right-sided motor seizure and aphasia.   Neurologic Headache: resolved Stroke: Incidental left posterior frontal and parietal multiple small infarcts secondary to small vessel disease versus rheumatoid vasculitis  Seizure: started R hand, x 10 mins, Secondary episode included R hand and R leg Continue depakote.  Questionable history of atrial fibrillation Irregular heartbeat per patient, none appreciated on exam or monitoring. Not on anticoagulation prior to admission  Continue ASA daily and statin for stoke prevention (discussed with Dr. Meryl Dare to his head of stroke and treated patient in the hospital, he would like to her to stay on aspirin and not Plavix)  Follow closely with pcp for management of vascular risk factors Will repeat MRi of the brain due to new symptoms since admission  Naomie Dean, MD  [facility] [address] [place], [place] [address]  Phone [phone] Fax [phone]

## 2015-02-12 DIAGNOSIS — R2689 Other abnormalities of gait and mobility: Secondary | ICD-10-CM | POA: Diagnosis not present

## 2015-02-12 DIAGNOSIS — N390 Urinary tract infection, site not specified: Secondary | ICD-10-CM | POA: Diagnosis not present

## 2015-02-12 DIAGNOSIS — G40909 Epilepsy, unspecified, not intractable, without status epilepticus: Secondary | ICD-10-CM | POA: Diagnosis not present

## 2015-02-12 DIAGNOSIS — S066X0D Traumatic subarachnoid hemorrhage without loss of consciousness, subsequent encounter: Secondary | ICD-10-CM | POA: Diagnosis not present

## 2015-02-12 DIAGNOSIS — A499 Bacterial infection, unspecified: Secondary | ICD-10-CM | POA: Diagnosis not present

## 2015-02-12 DIAGNOSIS — I69398 Other sequelae of cerebral infarction: Secondary | ICD-10-CM | POA: Diagnosis not present

## 2015-02-15 DIAGNOSIS — S066X0D Traumatic subarachnoid hemorrhage without loss of consciousness, subsequent encounter: Secondary | ICD-10-CM | POA: Diagnosis not present

## 2015-02-15 DIAGNOSIS — N390 Urinary tract infection, site not specified: Secondary | ICD-10-CM | POA: Diagnosis not present

## 2015-02-15 DIAGNOSIS — A499 Bacterial infection, unspecified: Secondary | ICD-10-CM | POA: Diagnosis not present

## 2015-02-15 DIAGNOSIS — R2689 Other abnormalities of gait and mobility: Secondary | ICD-10-CM | POA: Diagnosis not present

## 2015-02-15 DIAGNOSIS — G40909 Epilepsy, unspecified, not intractable, without status epilepticus: Secondary | ICD-10-CM | POA: Diagnosis not present

## 2015-02-15 DIAGNOSIS — I69398 Other sequelae of cerebral infarction: Secondary | ICD-10-CM | POA: Diagnosis not present

## 2015-02-16 DIAGNOSIS — N390 Urinary tract infection, site not specified: Secondary | ICD-10-CM | POA: Diagnosis not present

## 2015-02-16 DIAGNOSIS — I69398 Other sequelae of cerebral infarction: Secondary | ICD-10-CM | POA: Diagnosis not present

## 2015-02-16 DIAGNOSIS — G40909 Epilepsy, unspecified, not intractable, without status epilepticus: Secondary | ICD-10-CM | POA: Diagnosis not present

## 2015-02-16 DIAGNOSIS — S066X0D Traumatic subarachnoid hemorrhage without loss of consciousness, subsequent encounter: Secondary | ICD-10-CM | POA: Diagnosis not present

## 2015-02-16 DIAGNOSIS — R2689 Other abnormalities of gait and mobility: Secondary | ICD-10-CM | POA: Diagnosis not present

## 2015-02-16 DIAGNOSIS — A499 Bacterial infection, unspecified: Secondary | ICD-10-CM | POA: Diagnosis not present

## 2015-02-17 DIAGNOSIS — G40909 Epilepsy, unspecified, not intractable, without status epilepticus: Secondary | ICD-10-CM | POA: Diagnosis not present

## 2015-02-17 DIAGNOSIS — N390 Urinary tract infection, site not specified: Secondary | ICD-10-CM | POA: Diagnosis not present

## 2015-02-17 DIAGNOSIS — I69398 Other sequelae of cerebral infarction: Secondary | ICD-10-CM | POA: Diagnosis not present

## 2015-02-17 DIAGNOSIS — A499 Bacterial infection, unspecified: Secondary | ICD-10-CM | POA: Diagnosis not present

## 2015-02-17 DIAGNOSIS — R2689 Other abnormalities of gait and mobility: Secondary | ICD-10-CM | POA: Diagnosis not present

## 2015-02-17 DIAGNOSIS — S066X0D Traumatic subarachnoid hemorrhage without loss of consciousness, subsequent encounter: Secondary | ICD-10-CM | POA: Diagnosis not present

## 2015-02-22 ENCOUNTER — Telehealth: Payer: Self-pay | Admitting: Neurology

## 2015-02-22 NOTE — Telephone Encounter (Signed)
I spoke to patient`s daughter about her episode this am and answered her questions. She was seen by EMS and was not felt to have any signs of a stroke and was improving and hence did  not  go to the hospital. She is already scheduled for an MRI next week I advised that she keep that appointment and if her symptoms of tiredness have not improved see her primary care physician. The patient's daughter appeared to be quite dissatisfied with the care that I have been providing and plans to complain to the hospital administrator. I tried explaining to her that I'm in the hospital and cannot call back immediately when she had called earlier this morning but she did not seem to understand this. I think the care provided by my office and me has been appropriate but the patient's daughter is not satisfied with this

## 2015-02-22 NOTE — Telephone Encounter (Signed)
Spoke to daughter. She confirmed details in the previous note. Daughter said she failed to mention to EMS that her and her sister have noticed a change in the patient's speech pattern. Patient's speech seems to be slightly slurred periodically. The patient denied that she is having any trouble with her partial plate. Daughter states patient reports she is feeling much better now, has taken meds and plans to rest. The patient's daughters are concerned that the patient is fatigued a lot since having stroke and wanted to ask Dr. Pearlean Brownie is this normal that she sleeps all the time. Advised daughter would let Dr. Pearlean Brownie know and will ask about pt being fatigue. Daughter agreed.

## 2015-02-22 NOTE — Telephone Encounter (Signed)
Patient's daughter is calling about an episode her mother had this morning about an hour ago. Patient stated she was feeling jittery and nervous and the toes on her right foot was numb and tingly with rapid heart beat.  911 was called and they checked her vitals and stated they were all good with good blood pressure.   Within 20 minutes she was feeling much better. EMT did not see any outward signs of a stroke.  She did not go to hospital. The patient has seen Dr. Lucia Gaskins for migraines and Dr. Pearlean Brownie in hospital and other.  Daughter wants message sent to Dr. Marlis Edelson nurse.  Please call.

## 2015-02-23 ENCOUNTER — Encounter: Payer: Self-pay | Admitting: Nurse Practitioner

## 2015-02-23 ENCOUNTER — Ambulatory Visit (INDEPENDENT_AMBULATORY_CARE_PROVIDER_SITE_OTHER): Payer: Medicare Other | Admitting: Nurse Practitioner

## 2015-02-23 ENCOUNTER — Telehealth: Payer: Self-pay | Admitting: Nurse Practitioner

## 2015-02-23 VITALS — BP 142/70 | HR 72 | Resp 16 | Ht 59.0 in | Wt 108.0 lb

## 2015-02-23 DIAGNOSIS — N813 Complete uterovaginal prolapse: Secondary | ICD-10-CM | POA: Diagnosis not present

## 2015-02-23 DIAGNOSIS — I639 Cerebral infarction, unspecified: Secondary | ICD-10-CM | POA: Diagnosis not present

## 2015-02-23 DIAGNOSIS — G40909 Epilepsy, unspecified, not intractable, without status epilepticus: Secondary | ICD-10-CM | POA: Diagnosis not present

## 2015-02-23 DIAGNOSIS — N390 Urinary tract infection, site not specified: Secondary | ICD-10-CM | POA: Diagnosis not present

## 2015-02-23 DIAGNOSIS — I69398 Other sequelae of cerebral infarction: Secondary | ICD-10-CM | POA: Diagnosis not present

## 2015-02-23 DIAGNOSIS — A499 Bacterial infection, unspecified: Secondary | ICD-10-CM | POA: Diagnosis not present

## 2015-02-23 DIAGNOSIS — R2689 Other abnormalities of gait and mobility: Secondary | ICD-10-CM | POA: Diagnosis not present

## 2015-02-23 DIAGNOSIS — S066X0D Traumatic subarachnoid hemorrhage without loss of consciousness, subsequent encounter: Secondary | ICD-10-CM | POA: Diagnosis not present

## 2015-02-23 MED ORDER — METRONIDAZOLE 0.75 % VA GEL: VAGINAL | Status: DC

## 2015-02-23 NOTE — Telephone Encounter (Signed)
Spoke with patients daughter "[name]", okay per ROI. Daughter states patient was in the hospital from June 21-24. Patient had two "Silent strokes" and two "seizures." Before having the "seizures" she had no outward signs of having a stroke. Afterwards she could not speak for 18 hours and was weak on the right side. Patient is able to speak now and is currently under going OT and PT at home. Patient had an anxiety attack yesterday so daughter stayed home with her. Has follow up MRI on July 21st to see if "seizures" were "seizures" or "mini strokes." Has been evaluated with Dr. Margo Common at [facility]. Has a PCP appointment tomorrow with Dr.Wilson. Is on an anti seizure medication divalproex. Patient is fatigued but was told this is normal healing. Daughter states "I just wanted Lauro Franklin, FNP to know all of this. Her mind is good. It has just been a lot and she may not remember to tell her everything." Advised I will let Lauro Franklin, FNP know for patient's appointment today. Daughter states she can be reached if office has any further questions.  Routing to provider for final review. Patient agreeable to disposition. Will close encounter.   Patient aware provider will review message and nurse will return call if any additional advice or change of disposition.

## 2015-02-23 NOTE — Patient Instructions (Addendum)
Use Metrogel (Metroniazole) cream every night for 5 nights then go back to Premarin vaginal cream.  If you do not have enough cream let me know and new RX will be sent in.

## 2015-02-23 NOTE — Telephone Encounter (Signed)
Patient picked up her prescription for metxronidazole and has a question. Patient has a prescription for metroniazole at home and has a small amount left and would like use. Patient is questioning the different spelling of medication.

## 2015-02-23 NOTE — Progress Notes (Signed)
Subjective:   81 y.o. Widowed AA female [id number] here for pessary check.  Patient has been using following pessary style and size:  2 3/4" ring with support.  She describes the following issues with the pessary:  none.   Use of protective clothing such as Depends or pads Yes.  . Problems with protective clothing with rash No..  Constipation issues with use of pessary No..  She is not sexually active.     ROS:  No breast pain or new or enlarging lumps on self exam,  no abnormal bleeding, pelvic pain or discharge.  Pt. denies dysuria, trouble voiding or hematuria. Compliant to use of vaginal cream Yes.   She has had recent problems with a headache and seen at ED. She then went back to ED on 6/21- 6/24 with hospital admission for 2 'silent strokes' and 2 seizures.  She has been having OT and PT to regain her strength.  She has a follow up apt for MRI on 7/21 to see if changes were from mini strokes or seizure.  She has been weak and at times of hospital stay had some confusion.  That seems to be better now and can recall most things.   She normally ambulates with a walker - today she is in a wheelchair.   General Exam:    BP 142/70 mmHg  Pulse 72  Resp 16  Ht 4\' 11"  (1.499 m)  Wt 108 lb (48.988 kg)  BMI 21.80 kg/m2  LMP 08/14/1981  General appearance: alert, cooperative, appears stated age and no distress   Pelvic: External genitalia:  no lesions, normal escutcheon and well estrogenic   Before pessary was removed No. prolapse over the pessary   In correct position Yes.                Urethra: normal appearing urethra with no masses, tenderness or lesions              Vagina: vaginal erythema on the right posterior wall without friability.  There are no abrasions or ulcerations.  Thin watery yellow discharge               Cervix: normal appearance Cervical lesions were not found   Bimanual Exam:  Uterus:  normal                               Adnexa:    normal                              Pessary was removed without difficulty without using forceps.  Pessary was cleansed with Betadine.  Pessary was replaced. Patient tolerated procedure well.    Assessment :  Urge incontinence, Cystocele- symptomatic       area of irritation around the right posterior vagina wall with a slight yellow discharge   Use of pessary continued   Plan:    Return to office in 2 months for recheck.   She will use Metrogel at HS X 5 to help with the irritation and vaginal discharge         We discussed that during her illness and not being as mobile that sometimes we remove the pessary so as not to cause the pessary ring to cause an erosion.  This would more of a problem if she was unable to use her estrogen cream.   She understood the  advise but she wanted her pessary to remain in as she is compliant to her estrogen cream 3 times a week.  She does understand that if her condition changes we may advise this. Support and prayer is given.  An After Visit Summary was printed and given to the patient.

## 2015-02-23 NOTE — Telephone Encounter (Signed)
Patient's daughter "Anna Mccoy" calling for mom asking to talk to Anna Mccoy before her mom is see today. Anna Mccoy says her mom has two stokes recently and has some questions before her mom is seen today.  Dpr on file to talk with Anna Mccoy.

## 2015-02-23 NOTE — Telephone Encounter (Signed)
Spoke with patient. Patient states rx at home is metronidazole. Is asking if this is the same rx Lauro Franklin, FNP would like her to use. Advised this is same rx. Patient states rx expired on 10/22/2014. Advised will need new rx for Metronidazole use at night for 5 nights sent to pharmacy on file. Patient is agreeable.  Routing to provider for final review. Patient agreeable to disposition. Will close encounter.   Patient aware provider will review message and nurse will return call if any additional advice or change of disposition.

## 2015-02-24 DIAGNOSIS — G40909 Epilepsy, unspecified, not intractable, without status epilepticus: Secondary | ICD-10-CM | POA: Diagnosis not present

## 2015-02-24 DIAGNOSIS — A499 Bacterial infection, unspecified: Secondary | ICD-10-CM | POA: Diagnosis not present

## 2015-02-24 DIAGNOSIS — I10 Essential (primary) hypertension: Secondary | ICD-10-CM | POA: Diagnosis not present

## 2015-02-24 DIAGNOSIS — Z09 Encounter for follow-up examination after completed treatment for conditions other than malignant neoplasm: Secondary | ICD-10-CM | POA: Diagnosis not present

## 2015-02-24 DIAGNOSIS — I639 Cerebral infarction, unspecified: Secondary | ICD-10-CM | POA: Diagnosis not present

## 2015-02-24 DIAGNOSIS — Z87898 Personal history of other specified conditions: Secondary | ICD-10-CM | POA: Diagnosis not present

## 2015-02-24 DIAGNOSIS — N390 Urinary tract infection, site not specified: Secondary | ICD-10-CM | POA: Diagnosis not present

## 2015-02-24 DIAGNOSIS — R2689 Other abnormalities of gait and mobility: Secondary | ICD-10-CM | POA: Diagnosis not present

## 2015-02-24 DIAGNOSIS — F418 Other specified anxiety disorders: Secondary | ICD-10-CM | POA: Diagnosis not present

## 2015-02-24 DIAGNOSIS — I69398 Other sequelae of cerebral infarction: Secondary | ICD-10-CM | POA: Diagnosis not present

## 2015-02-24 DIAGNOSIS — S066X0D Traumatic subarachnoid hemorrhage without loss of consciousness, subsequent encounter: Secondary | ICD-10-CM | POA: Diagnosis not present

## 2015-02-25 DIAGNOSIS — S066X0D Traumatic subarachnoid hemorrhage without loss of consciousness, subsequent encounter: Secondary | ICD-10-CM | POA: Diagnosis not present

## 2015-02-25 DIAGNOSIS — G40909 Epilepsy, unspecified, not intractable, without status epilepticus: Secondary | ICD-10-CM | POA: Diagnosis not present

## 2015-02-25 DIAGNOSIS — R2689 Other abnormalities of gait and mobility: Secondary | ICD-10-CM | POA: Diagnosis not present

## 2015-02-25 DIAGNOSIS — N390 Urinary tract infection, site not specified: Secondary | ICD-10-CM | POA: Diagnosis not present

## 2015-02-25 DIAGNOSIS — I69398 Other sequelae of cerebral infarction: Secondary | ICD-10-CM | POA: Diagnosis not present

## 2015-02-25 DIAGNOSIS — A499 Bacterial infection, unspecified: Secondary | ICD-10-CM | POA: Diagnosis not present

## 2015-02-25 NOTE — Progress Notes (Signed)
Encounter reviewed by Dr. Janean Sark.  Will need to check with neurology to see if patient is a candidate to continue with vaginal estrogen cream.

## 2015-02-26 DIAGNOSIS — I69398 Other sequelae of cerebral infarction: Secondary | ICD-10-CM | POA: Diagnosis not present

## 2015-02-26 DIAGNOSIS — G40909 Epilepsy, unspecified, not intractable, without status epilepticus: Secondary | ICD-10-CM | POA: Diagnosis not present

## 2015-02-26 DIAGNOSIS — A499 Bacterial infection, unspecified: Secondary | ICD-10-CM | POA: Diagnosis not present

## 2015-02-26 DIAGNOSIS — S066X0D Traumatic subarachnoid hemorrhage without loss of consciousness, subsequent encounter: Secondary | ICD-10-CM | POA: Diagnosis not present

## 2015-02-26 DIAGNOSIS — R2689 Other abnormalities of gait and mobility: Secondary | ICD-10-CM | POA: Diagnosis not present

## 2015-02-26 DIAGNOSIS — N390 Urinary tract infection, site not specified: Secondary | ICD-10-CM | POA: Diagnosis not present

## 2015-03-01 ENCOUNTER — Telehealth: Payer: Self-pay | Admitting: Nurse Practitioner

## 2015-03-01 DIAGNOSIS — I69398 Other sequelae of cerebral infarction: Secondary | ICD-10-CM | POA: Diagnosis not present

## 2015-03-01 DIAGNOSIS — S066X0D Traumatic subarachnoid hemorrhage without loss of consciousness, subsequent encounter: Secondary | ICD-10-CM | POA: Diagnosis not present

## 2015-03-01 DIAGNOSIS — G40909 Epilepsy, unspecified, not intractable, without status epilepticus: Secondary | ICD-10-CM | POA: Diagnosis not present

## 2015-03-01 DIAGNOSIS — A499 Bacterial infection, unspecified: Secondary | ICD-10-CM | POA: Diagnosis not present

## 2015-03-01 DIAGNOSIS — R2689 Other abnormalities of gait and mobility: Secondary | ICD-10-CM | POA: Diagnosis not present

## 2015-03-01 DIAGNOSIS — N390 Urinary tract infection, site not specified: Secondary | ICD-10-CM | POA: Diagnosis not present

## 2015-03-01 NOTE — Telephone Encounter (Signed)
Patient calling requesting to verify whether Metronidazole vaginal gel is an antibiotic or not.

## 2015-03-01 NOTE — Telephone Encounter (Signed)
Spoke with patient. Patient asking if Metrogel is an antibiotic. Advised patient that this is an antibiotic. Patient is agreeable. States that she has an "[place]" treatment tomorrow and will reschedule this as she is not supposed to take antibiotics before having this. Will call to reschedule this treatment to later in the week. Has completed all 5 days of Metrogel. Will call if she has any further questions.   Routing to provider for final review. Patient agreeable to disposition. Will close encounter.   Patient aware provider will review message and nurse will return call if any additional advice or change of disposition.

## 2015-03-03 ENCOUNTER — Telehealth: Payer: Self-pay | Admitting: Neurology

## 2015-03-03 DIAGNOSIS — G40909 Epilepsy, unspecified, not intractable, without status epilepticus: Secondary | ICD-10-CM | POA: Diagnosis not present

## 2015-03-03 DIAGNOSIS — I69398 Other sequelae of cerebral infarction: Secondary | ICD-10-CM | POA: Diagnosis not present

## 2015-03-03 DIAGNOSIS — N390 Urinary tract infection, site not specified: Secondary | ICD-10-CM | POA: Diagnosis not present

## 2015-03-03 DIAGNOSIS — R2689 Other abnormalities of gait and mobility: Secondary | ICD-10-CM | POA: Diagnosis not present

## 2015-03-03 DIAGNOSIS — A499 Bacterial infection, unspecified: Secondary | ICD-10-CM | POA: Diagnosis not present

## 2015-03-03 DIAGNOSIS — S066X0D Traumatic subarachnoid hemorrhage without loss of consciousness, subsequent encounter: Secondary | ICD-10-CM | POA: Diagnosis not present

## 2015-03-03 NOTE — Telephone Encounter (Signed)
Spoke w. Daughter, [name] (on DPR form) and told her Dr. Lucia Gaskins put in her notes that she would like to repeat MRI d/t new symptoms since admission in hospital. Daughter verbalized understanding. Told her to call back w/ any further questions.

## 2015-03-03 NOTE — Telephone Encounter (Signed)
Patient's daughter is calling. The patient is scheduled for an MRI tomorrrow and her daughter is wondering if this is necessary since she had two MRI's a month ago in the hospital. Please call and advise.

## 2015-03-04 ENCOUNTER — Ambulatory Visit
Admission: RE | Admit: 2015-03-04 | Discharge: 2015-03-04 | Disposition: A | Discharge status: Home / Self Care | Payer: Medicare Other | Source: Ambulatory Visit | Attending: Neurology | Admitting: Neurology

## 2015-03-04 DIAGNOSIS — R410 Disorientation, unspecified: Secondary | ICD-10-CM

## 2015-03-04 DIAGNOSIS — I639 Cerebral infarction, unspecified: Secondary | ICD-10-CM

## 2015-03-04 DIAGNOSIS — S065X9A Traumatic subdural hemorrhage with loss of consciousness of unspecified duration, initial encounter: Secondary | ICD-10-CM

## 2015-03-04 DIAGNOSIS — I6200 Nontraumatic subdural hemorrhage, unspecified: Secondary | ICD-10-CM | POA: Diagnosis not present

## 2015-03-04 DIAGNOSIS — S065XAA Traumatic subdural hemorrhage with loss of consciousness status unknown, initial encounter: Secondary | ICD-10-CM

## 2015-03-04 DIAGNOSIS — F05 Delirium due to known physiological condition: Secondary | ICD-10-CM | POA: Diagnosis not present

## 2015-03-04 DIAGNOSIS — H538 Other visual disturbances: Secondary | ICD-10-CM | POA: Diagnosis not present

## 2015-03-08 ENCOUNTER — Telehealth: Payer: Self-pay | Admitting: Neurology

## 2015-03-08 NOTE — Telephone Encounter (Signed)
Spoke w/ daughter, [name] who is listed on pt DPR and told her pt MRI is stable from 02/02/15. No new strokes. Verified next appt for 05/12/15 at 2:30 w/ Dr. Pearlean Brownie. She verbalized understanding.

## 2015-03-08 NOTE — Telephone Encounter (Addendum)
Patient's daughter Terrace Arabia is calling to get the results of mother's MRI.  Please call [phone] .

## 2015-03-09 DIAGNOSIS — M0589 Other rheumatoid arthritis with rheumatoid factor of multiple sites: Secondary | ICD-10-CM | POA: Diagnosis not present

## 2015-04-05 DIAGNOSIS — H1820 Unspecified corneal edema: Secondary | ICD-10-CM | POA: Diagnosis not present

## 2015-04-06 ENCOUNTER — Other Ambulatory Visit: Payer: Self-pay | Admitting: *Deleted

## 2015-04-06 MED ORDER — ESTROGENS, CONJUGATED 0.625 MG/GM VA CREA: TOPICAL_CREAM | VAGINAL | Status: DC

## 2015-04-06 NOTE — Telephone Encounter (Signed)
Medication refill request: Premarin  Refills:  Patient has refills at her local pharmacy however her insurance is requiring she get it through the mail in service. #90 day supply. Please advise

## 2015-04-06 NOTE — Telephone Encounter (Signed)
Ok for [place] to send this to her mail order pharmacy.

## 2015-04-07 ENCOUNTER — Telehealth: Payer: Self-pay | Admitting: Nurse Practitioner

## 2015-04-07 NOTE — Telephone Encounter (Signed)
Ok to send to [employer]

## 2015-04-07 NOTE — Telephone Encounter (Signed)
Patient's daughter called and said she has a form from [employer] to get this patient's Premarin covered for free that will need completed by the medical provider. She will fax the form over tomorrow.

## 2015-04-07 NOTE — Telephone Encounter (Signed)
Routing to [place], FNP as Lorain Childes.  Routing to provider for final review. Patient agreeable to disposition. Will close encounter.   Patient aware provider will review message and nurse will return call if any additional advice or change of disposition.

## 2015-04-07 NOTE — Telephone Encounter (Signed)
Please telephone encounter from 04/07/15

## 2015-04-08 DIAGNOSIS — M0589 Other rheumatoid arthritis with rheumatoid factor of multiple sites: Secondary | ICD-10-CM | POA: Diagnosis not present

## 2015-04-08 DIAGNOSIS — R5383 Other fatigue: Secondary | ICD-10-CM | POA: Diagnosis not present

## 2015-04-12 ENCOUNTER — Telehealth: Payer: Self-pay

## 2015-04-12 NOTE — Telephone Encounter (Signed)
I would be glad to help in any way that I could.

## 2015-04-12 NOTE — Telephone Encounter (Signed)
Call to patient's daughter [name], okay per ROI. Advised we have received the [employer] forms for patient's Premarin vaginal cream. Lauro Franklin, FNP has completed the prescriber form and this is ready for pick up or fax. [name] states she was told that Lauro Franklin, FNP would have to write a letter with our office letter head on it taking a statement from the patient regarding monthly income. Advised I am not sure that this can be completed by our office. Daughter is asking if her mother could come to our office to receive help filling out the remainder of the forms. Advised will need to speak with administration and Lauro Franklin, FNP to see if this is something that can be done. Daughter is agreeable.

## 2015-04-14 NOTE — Telephone Encounter (Addendum)
Called and s/w pharmacist needed to know how much premarin cream to dispense for a 90 day supply they will dispense three 30 gm tubes for a 90 day supply. Patient is to use the premarin 3x/wk.  Encounter closed.

## 2015-04-14 NOTE — Telephone Encounter (Signed)
[facility] is calling to get clarification for premarin cream. Reference # [id number]

## 2015-04-22 NOTE — Telephone Encounter (Signed)
Spoke with patient's daughter Anna Mccoy, okay per ROI and patient regarding forms for [employer] for Premarin vaginal cream. Advised Ria Comment, FNP has completed her page of the forms. Ria Comment, FNP and I have reviewed the forms and it requires personal documents that she will need to provide. Advised I will put yellow tabs by the areas of the forms that need to be completed as some portions apply to a different medication. Anna Mccoy and patient are agreeable and will provide the necessary information and fill out the remainder of the forms. Will call if they need any additional help. Forms sent to patient's verified address on file.  Routing to provider for final review. Patient agreeable to disposition. Will close encounter.

## 2015-04-26 DIAGNOSIS — H1820 Unspecified corneal edema: Secondary | ICD-10-CM | POA: Diagnosis not present

## 2015-04-26 DIAGNOSIS — H26491 Other secondary cataract, right eye: Secondary | ICD-10-CM | POA: Diagnosis not present

## 2015-04-26 DIAGNOSIS — Z961 Presence of intraocular lens: Secondary | ICD-10-CM | POA: Diagnosis not present

## 2015-05-06 DIAGNOSIS — M0589 Other rheumatoid arthritis with rheumatoid factor of multiple sites: Secondary | ICD-10-CM | POA: Diagnosis not present

## 2015-05-12 ENCOUNTER — Ambulatory Visit (INDEPENDENT_AMBULATORY_CARE_PROVIDER_SITE_OTHER): Payer: Medicare Other | Admitting: Neurology

## 2015-05-12 ENCOUNTER — Encounter: Payer: Self-pay | Admitting: Neurology

## 2015-05-12 VITALS — BP 154/67 | HR 76 | Ht <= 58 in | Wt 100.2 lb

## 2015-05-12 DIAGNOSIS — I639 Cerebral infarction, unspecified: Secondary | ICD-10-CM

## 2015-05-12 DIAGNOSIS — I6381 Other cerebral infarction due to occlusion or stenosis of small artery: Secondary | ICD-10-CM

## 2015-05-12 NOTE — Progress Notes (Signed)
[id number]    Anna Mccoy:  Dr Lucia Gaskins Referring Desira Alessandrini: [name], MD Primary Care Physician:  Pamelia Hoit, MD  CC:  Stroke  HPI:  Dr Lucia Gaskins 02/11/2015 :  Anna Mccoy is a 81 y.o. female here as a referral from Dr. Andrey Campanile for follow up of stroke.  She has a PMHx of LBP, lumbar spinal stenosis s/p surgery 2010, right foot numbness, HTN, hypothyroidism, RA who presented to ED on 01/22/2015 complaining of left sided temporal and maxillary stabbing and lancinating pain. She was on Plavix daily and aspirin 81 mg every 3 days. Neurologic exam nonfocal. MRI of the brain demonstrated areas of acute infarction in the left posterior frontal and parietal cortex areas. Also showed small left occipital subdural hemorrhage. She was admitted to [facility] for stroke workup. The headache was acute in onset and be going on for about a week. Symptoms were described as stabbing and intermittent without photophobia, phonophobia, nausea, vomiting, visual changes, or autonomic features.  She is here with her 2 daughters who provide most of the information. She feels she is doing well. Not back to her baseline but doing better. She is taking care of herself, daughter taking good care of her and she is at peace. She has no had a headache since leaving the hospital. She sometimes has some difficulty explaining things, she gets off track but no confusional spells. She may ask the same questions twice such as about medicine but patient says she just wants to make sure she takes it correctly. She lives wither two daughters and her sister that is 1. No more seizures or seizure like episodes. No aphasia. Her vision is not as clear as it was prior to the stroke. She had cataract removal in May and improved but worsened after the stroke. She notes that this morning she was able to read better and she was able to read fine print and she is very happy about that. She had PT yesterday and she is  doing very well, and she is going to start occupational Therapy. She is taking the Depakote and is not having any side effects. She is taking aspirin every 3 days (should be taking every day). She is still on the Plavix.   Per daughters: They got home this past Friday and she is having coordination issues, reaching out for objects and thinks she is grabbing them but isn't. Her vision is blurry since she has been home. Vision is worsening, she can't recognize people in pictures on the mantle because she can't see them clearly anymore. She is more forgetful since being home. Patient is asking the same questions. She is getting more things mixed up but not altered in any way. Daughters are worried she has had a repeat stroke, the symptoms started happening after the last MRI of the brain.  Reviewed notes, labs and imaging from outside physicians, which showed: She presented to the ED on 01/22/2015 complaining of left sided temporal and maxillary stabbing and lancinating pain. She was on Plavix daily and aspirin 81 mg every 3 days. Neurologic exam nonfocal. MRI of the brain demonstrated areas of acute infarction in the left posterior frontal and parietal cortex areas. Also showed left occipital subdural hemorrhage unclear if it was secondary to a fall 1 week prior versus meningeal thickening secondary to rheumatoid arthritis. The left posterior frontal and parietal multiple small infarcts were thought to be secondary to small vessel disease versus rheumatoid vasculitis(personally reviewed images). MRA was unremarkable.  She was admitted to [facility] for stroke workup. While inpatient, patient had a seizure with right leg and hand shaking and expressive aphasia. She was started on Depakote with good results for both the seizures and the headache.  LDL was 120, statin was started hgba1c 5.5 Cbc with mild anemia Bmp with slightlky elevated gfr  EEG: This is an abnormal electroencephalogram secondary to left  temporal slowing and rare left temporal sharp transients. This is consistent with the patient's history of left hemispheric acute infarcts seen on MRI.   MRI w/contrast: IMPRESSION: Smooth dural thickening and enhancement overlying the left cerebral hemisphere as above. This is felt to most likely be reactive in nature due to the presence of the left subdural collection as identified on previous MRI. The previously identified left occipital collection itself does not enhance, and most likely reflects a small subacute subdural hemorrhage related to recent fall/ trauma.  MRI HEAD FINDINGS WO CONTRAST  Subcentimeter areas of acute infarction affect the LEFT posterior frontal and parietal cortex near the vertex.  No hemorrhage, mass lesion, hydrocephalus, or extra-axial fluid. Generalized atrophy. Moderately advanced chronic microvascular ischemic change. Flow voids are preserved. No midline abnormality. No osseous findings. Extracranial soft tissues unremarkable. Moderate retention cyst formation RIGHT maxillary sinus. Negative orbits.  Compared with prior CT, the infarcts are not visible.  MRA HEAD FINDINGS  Dolichoectatic but widely patent internal carotid arteries. Basilar artery widely patent with both vertebrals contributing. New no proximal stenosis of the anterior, middle, or posterior cerebral arteries. No MCA branch occlusion. No intracranial aneurysm.  IMPRESSION: Subcentimeter foci of restricted diffusion in the LEFT posterior frontal and parietal cortex consistent with multiple small infarcts. These are nonhemorrhagic. See discussion above.  Atrophy and small vessel disease.  No proximal flow reducing lesion is evident.  Echo: The cavity size was normal. Systolic function was normal. The estimated ejection fraction was in the range of 55% to 60%. Wall motion was normal; there were no regional wall motion abnormalities.  Carotids: Findings  suggest 1-39% internal carotid artery stenosis bilaterally. Vertebral arteries are patent with antegrade flow. Questionable history of atrial fibrillation Irregular heartbeat per patient, none appreciated on exam or monitoring. Not on anticoagulation prior to admission  Update 05/12/2015 : She returns for follow-up after last visit 3 months ago with Dr. Daisy Blossom. She is doing well without recurrent seizures or strokelike episodes. She is tolerating Depakote well without significant side effects except possible minor and tremors which are not functionally disabling. She remains on aspirin 81 mg is tolerating well with out significant bleeding or bruising. Her blood pressure is usually well controlled though it is slightly elevated at 154/67 in office today. She had follow-up MRI scan of the brain done on 03/04/15 which I personally reviewed shows changes of small vessel disease and mild atrophy. The previously noted small subcutaneous is not as well appreciated. Patient is overall doing well and has had no new complaints. Review of Systems: Patient complains of symptoms per HPI as well as the following symptoms:  Light sensitivity, leg swelling, murmur, constipation, daytime sleepiness, joint swelling, nervousness and anxiety and all other systems negative.   Social History   Social History  . Marital Status: Widowed    Spouse Name: N/A  . Number of Children: 5  . Years of Education: 12   Occupational History  . Retired     Social History Main Topics  . Smoking status: Never Smoker   . Smokeless tobacco: Never Used  . Alcohol  Use: No  . Drug Use: No  . Sexual Activity: No     Comment: Widowed 01/10/07 age 89   Other Topics Concern  . Not on file   Social History Narrative   Patient is right handed, resides with daughter and sister.    Caffeine use: Coffee (drinks 1/2-1 cup per day)    Family History  Problem Relation Age of Onset  . Hypertension Mother   . Stroke Mother   .  Hypertension Sister   . Diabetes Sister   . Hypertension Sister   . Diabetes Sister     Past Medical History  Diagnosis Date  . Abnormal EKG   . Hypertension   . Thyroid disease     HYPOTHYROIDISM  . Rheumatoid arthritis(714.0)   . Hx-TIA (transient ischemic attack)   . Chest pain     H/O  . Fatigue   . RBBB   . Aortic valve sclerosis   . Mitral valve regurgitation     TRIVIAL  . Tricuspid valve regurgitation     MILD  . Procidentia of uterus     uses pessary  . Menopausal state age 72  . Vitamin D deficiency disease   . DDD (degenerative disc disease), lumbar   . Post-menopausal bleeding 02/2011    Endo Biopsy 7/12 benign, no hyperplasia  . Arthritis     Past Surgical History  Procedure Laterality Date  . Cholecystectomy, laparoscopic  2002  . Laminectomy and microdiscectomy lumbar spine  01/23/09  . Colonoscopy w/ biopsies  6/09    sigmoid diverticuli recheck 5 years  . Vaginal delivery      x5  . Eye surgery  04/24/14    Cataract sx in right eye with lens replacement.    Current Outpatient Prescriptions  Medication Sig Dispense Refill  . abatacept (ORENCIA) 250 MG injection Inject into the vein every 30 (thirty) days.     Marland Kitchen acetaminophen (TYLENOL) 500 MG tablet Take 500 mg by mouth as needed for mild pain.     Marland Kitchen amLODipine (NORVASC) 10 MG tablet Take 10 mg by mouth daily.    Marland Kitchen aspirin 81 MG tablet Take 81 mg by mouth every 3 (three) days.     Marland Kitchen atorvastatin (LIPITOR) 40 MG tablet Take 1 tablet (40 mg total) by mouth daily at 6 PM. 30 tablet 0  . conjugated estrogens (PREMARIN) vaginal cream USE VAGINALLY 3 TIMES WEEKLY 90 g 3  . divalproex (DEPAKOTE) 250 MG DR tablet Take 1 tablet (250 mg total) by mouth every 12 (twelve) hours. 60 tablet 0  . folic acid (FOLVITE) 1 MG tablet Take 1 mg by mouth daily.    . IRON PO Take 1 tablet by mouth daily.    Marland Kitchen leflunomide (ARAVA) 20 MG tablet Take 20 mg by mouth every other day.    . levothyroxine (SYNTHROID, LEVOTHROID)  75 MCG tablet Take 75 mcg by mouth daily before breakfast.    . metroNIDAZOLE (METROGEL) 0.75 % vaginal gel Place one applicator at night for 5 days. 70 g 0  . Multiple Vitamins-Minerals (OCUVITE PRESERVISION PO) Take by mouth.    Bertram Gala Glycol-Propyl Glycol (SYSTANE OP) Place 1 drop into both eyes 4 (four) times daily.    . potassium chloride SA (K-DUR,KLOR-CON) 20 MEQ tablet Take 20 mEq by mouth 2 (two) times daily.    . valsartan-hydrochlorothiazide (DIOVAN-HCT) 160-25 MG per tablet Take 1 tablet by mouth daily.    . Vitamin D, Ergocalciferol, (DRISDOL) 50000 UNITS CAPS  Take 50,000 Units by mouth every 14 (fourteen) days.     No current facility-administered medications for this visit.    Allergies as of 05/12/2015 - Review Complete 05/12/2015  Allergen Reaction Noted  . Ciprofloxacin Diarrhea 06/09/2013  . Lidocaine  11/10/2011  . Neosporin [neomycin-bacitracin zn-polymyx]  12/03/2012  . Sulfa antibiotics  11/10/2011    Vitals: BP 154/67 mmHg  Pulse 76  Ht  (1.448 m)  Wt 100 lb 3.2 oz (45.45 kg)  BMI 21.68 kg/m2  LMP 08/14/1981 Last Weight:  Wt Readings from Last 1 Encounters:  05/12/15 100 lb 3.2 oz (45.45 kg)   Last Height:   Ht Readings from Last 1 Encounters:  05/12/15  (1.448 m)   Physical exam: Exam: Gen: NAD, conversant, well nourised, well groomed                     CV: RRR, no MRG. No Carotid Bruits. No peripheral edema, warm, nontender Eyes: Conjunctivae clear without exudates or hemorrhage  Neuro: Detailed Neurologic Exam  Speech:    Speech is normal; fluent and spontaneous with normal comprehension.  Cognition:    The patient is oriented to person, place, and time;     recent and remote memory appear  intact;     language fluent;     normal attention, concentration,     fund of knowledge appears intact Cranial Nerves:    The pupils are equal, round, and reactive to light. The fundi are flat. Visual fields are full to finger  confrontation. Extraocular movements are intact. Trigeminal sensation is intact and the muscles of mastication are normal. The face is symmetric. The palate elevates in the midline. Hearing intact. Voice is normal. Shoulder shrug is normal. The tongue has normal motion without fasciculations.   Coordination:    No dysmetria   Gait:   Uses a walker, no ataxia  Motor Observation:    No asymmetry, no atrophy, and no involuntary movements noted. Tone:    Normal muscle tone.    Posture:    Posture is slightly stooped    Strength:    Strength is V/V in the upper and lower limbs.      Sensation: intact to LT     Reflex Exam:  DTR's:    Deep tendon reflexes in the upper and lower extremities are symmetrical bilaterally.   Toes:    The toes are downgoing bilaterally.   Clonus:    Clonus is absent.  Assessment/Plan:  Anna Mccoy is a 81 y.o. female with history of hypertension, hypothyroidism and irregular heartbeat presented with headache. Subsequent MRI showed small left occipital subdural hemorrhage, left posterior frontal and parietal multiple small vessel disease lacunar infarcts. Had episode of right-sided motor seizure and aphasia.     I had a long d/w patient and daughter about her recent stroke, risk for recurrent stroke/TIAs, personally independently reviewed imaging studies and stroke evaluation results and answered questions.Continue aspirin 81 mg orally every day  for secondary stroke prevention and maintain strict control of hypertension with blood pressure goal below 130/90, diabetes with hemoglobin A1c goal below 6.5% and lipids with LDL cholesterol goal below 100 mg/dL. I also advised the patient to eat a healthy diet with plenty of whole grains, cereals, fruits and vegetables, exercise regularly and maintain ideal body weight and drinks 6-8 glasses of fluids per day. She was also advised to continued Depakote 250 mg twice daily for seizure prophylaxis. Followup in  the  future with me in 6 months or call earlier if necessary.  Delia Heady, MD  [facility] [address] [place], [place] [address]  Phone [phone] Fax [phone]

## 2015-05-12 NOTE — Patient Instructions (Signed)
I had a long d/w patient and daughter about her recent stroke, risk for recurrent stroke/TIAs, personally independently reviewed imaging studies and stroke evaluation results and answered questions.Continue aspirin 81 mg orally every day  for secondary stroke prevention and maintain strict control of hypertension with blood pressure goal below 130/90, diabetes with hemoglobin A1c goal below 6.5% and lipids with LDL cholesterol goal below 100 mg/dL. I also advised the patient to eat a healthy diet with plenty of whole grains, cereals, fruits and vegetables, exercise regularly and maintain ideal body weight and drinks 6-8 glasses of fluids per day. She was also advised to continued Depakote 250 mg twice daily for seizure prophylaxis. Followup in the future with me in 6 months or call earlier if necessary. Stroke Prevention Some medical conditions and behaviors are associated with an increased chance of having a stroke. You may prevent a stroke by making healthy choices and managing medical conditions. HOW CAN I REDUCE MY RISK OF HAVING A STROKE?   Stay physically active. Get at least 30 minutes of activity on most or all days.  Do not smoke. It may also be helpful to avoid exposure to secondhand smoke.  Limit alcohol use. Moderate alcohol use is considered to be:  No more than 2 drinks per day for men.  No more than 1 drink per day for nonpregnant women.  Eat healthy foods. This involves:  Eating 5 or more servings of fruits and vegetables a day.  Making dietary changes that address high blood pressure (hypertension), high cholesterol, diabetes, or obesity.  Manage your cholesterol levels.  Making food choices that are high in fiber and low in saturated fat, trans fat, and cholesterol may control cholesterol levels.  Take any prescribed medicines to control cholesterol as directed by your health care provider.  Manage your diabetes.  Controlling your carbohydrate and sugar intake is  recommended to manage diabetes.  Take any prescribed medicines to control diabetes as directed by your health care provider.  Control your hypertension.  Making food choices that are low in salt (sodium), saturated fat, trans fat, and cholesterol is recommended to manage hypertension.  Take any prescribed medicines to control hypertension as directed by your health care provider.  Maintain a healthy weight.  Reducing calorie intake and making food choices that are low in sodium, saturated fat, trans fat, and cholesterol are recommended to manage weight.  Stop drug abuse.  Avoid taking birth control pills.  Talk to your health care provider about the risks of taking birth control pills if you are over 79 years old, smoke, get migraines, or have ever had a blood clot.  Get evaluated for sleep disorders (sleep apnea).  Talk to your health care provider about getting a sleep evaluation if you snore a lot or have excessive sleepiness.  Take medicines only as directed by your health care provider.  For some people, aspirin or blood thinners (anticoagulants) are helpful in reducing the risk of forming abnormal blood clots that can lead to stroke. If you have the irregular heart rhythm of atrial fibrillation, you should be on a blood thinner unless there is a good reason you cannot take them.  Understand all your medicine instructions.  Make sure that other conditions (such as anemia or atherosclerosis) are addressed. SEEK IMMEDIATE MEDICAL CARE IF:   You have sudden weakness or numbness of the face, arm, or leg, especially on one side of the body.  Your face or eyelid droops to one side.  You have  sudden confusion.  You have trouble speaking (aphasia) or understanding.  You have sudden trouble seeing in one or both eyes.  You have sudden trouble walking.  You have dizziness.  You have a loss of balance or coordination.  You have a sudden, severe headache with no known  cause.  You have new chest pain or an irregular heartbeat. Any of these symptoms may represent a serious problem that is an emergency. Do not wait to see if the symptoms will go away. Get medical help at once. Call your local emergency services (911 in [place] Do not drive yourself to the hospital. Document Released: 09/07/2004 Document Revised: 12/15/2013 Document Reviewed: 01/31/2013 [facility] Patient Information 2015 [place], [place]. This information is not intended to replace advice given to you by your health care provider. Make sure you discuss any questions you have with your health care provider.

## 2015-05-20 DIAGNOSIS — M150 Primary generalized (osteo)arthritis: Secondary | ICD-10-CM | POA: Diagnosis not present

## 2015-05-20 DIAGNOSIS — M4806 Spinal stenosis, lumbar region: Secondary | ICD-10-CM | POA: Diagnosis not present

## 2015-05-20 DIAGNOSIS — M0589 Other rheumatoid arthritis with rheumatoid factor of multiple sites: Secondary | ICD-10-CM | POA: Diagnosis not present

## 2015-05-20 DIAGNOSIS — M5136 Other intervertebral disc degeneration, lumbar region: Secondary | ICD-10-CM | POA: Diagnosis not present

## 2015-06-03 DIAGNOSIS — M0589 Other rheumatoid arthritis with rheumatoid factor of multiple sites: Secondary | ICD-10-CM | POA: Diagnosis not present

## 2015-06-14 DIAGNOSIS — R05 Cough: Secondary | ICD-10-CM | POA: Diagnosis not present

## 2015-06-14 DIAGNOSIS — M25473 Effusion, unspecified ankle: Secondary | ICD-10-CM | POA: Diagnosis not present

## 2015-06-14 DIAGNOSIS — R918 Other nonspecific abnormal finding of lung field: Secondary | ICD-10-CM | POA: Diagnosis not present

## 2015-06-14 DIAGNOSIS — R634 Abnormal weight loss: Secondary | ICD-10-CM | POA: Diagnosis not present

## 2015-07-01 DIAGNOSIS — M0589 Other rheumatoid arthritis with rheumatoid factor of multiple sites: Secondary | ICD-10-CM | POA: Diagnosis not present

## 2015-07-14 DIAGNOSIS — Z23 Encounter for immunization: Secondary | ICD-10-CM | POA: Diagnosis not present

## 2015-07-20 ENCOUNTER — Ambulatory Visit: Payer: Medicare Other | Admitting: Nurse Practitioner

## 2015-07-28 ENCOUNTER — Ambulatory Visit (INDEPENDENT_AMBULATORY_CARE_PROVIDER_SITE_OTHER): Payer: Medicare Other | Admitting: Nurse Practitioner

## 2015-07-28 ENCOUNTER — Encounter: Payer: Self-pay | Admitting: Nurse Practitioner

## 2015-07-28 ENCOUNTER — Telehealth: Payer: Self-pay | Admitting: Nurse Practitioner

## 2015-07-28 VITALS — BP 120/82 | HR 68 | Ht 59.0 in | Wt 102.0 lb

## 2015-07-28 DIAGNOSIS — Z4689 Encounter for fitting and adjustment of other specified devices: Secondary | ICD-10-CM

## 2015-07-28 DIAGNOSIS — I639 Cerebral infarction, unspecified: Secondary | ICD-10-CM | POA: Diagnosis not present

## 2015-07-28 MED ORDER — METRONIDAZOLE 0.75 % VA GEL: 1.0000 | Freq: Every day | VAGINAL | Status: DC

## 2015-07-28 NOTE — Telephone Encounter (Addendum)
Patient's daughter calling to ask that the prescription for the Metronidazole that was sent by mail order be called in to the [facility] in [place] at [phone]. dpr on file for daughter.

## 2015-07-28 NOTE — Telephone Encounter (Signed)
Spoke with patient's daughter [name], okay per ROI. Advised rx for Metronidazole 0.75 % gel has been sent to the [facility] in [place]. Daughter is agreeable.  Routing to provider for final review. Patient agreeable to disposition. Will close encounter.

## 2015-07-28 NOTE — Progress Notes (Signed)
Encounter reviewed Jill Jertson, MD   
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
## 2015-07-28 NOTE — Patient Instructions (Addendum)
Recheck in 3 months Have Dr. Andrey Campanile to check on Vit B 12 if not already done.

## 2015-07-29 DIAGNOSIS — M0589 Other rheumatoid arthritis with rheumatoid factor of multiple sites: Secondary | ICD-10-CM | POA: Diagnosis not present

## 2015-07-29 DIAGNOSIS — Z79899 Other long term (current) drug therapy: Secondary | ICD-10-CM | POA: Diagnosis not present

## 2015-08-18 DIAGNOSIS — H6123 Impacted cerumen, bilateral: Secondary | ICD-10-CM | POA: Diagnosis not present

## 2015-08-18 DIAGNOSIS — R197 Diarrhea, unspecified: Secondary | ICD-10-CM | POA: Diagnosis not present

## 2015-08-26 DIAGNOSIS — M0589 Other rheumatoid arthritis with rheumatoid factor of multiple sites: Secondary | ICD-10-CM | POA: Diagnosis not present

## 2015-08-31 ENCOUNTER — Ambulatory Visit: Payer: Medicare Other | Admitting: Nurse Practitioner

## 2015-08-31 DIAGNOSIS — R152 Fecal urgency: Secondary | ICD-10-CM | POA: Diagnosis not present

## 2015-08-31 DIAGNOSIS — Z800 Family history of malignant neoplasm of digestive organs: Secondary | ICD-10-CM | POA: Diagnosis not present

## 2015-08-31 DIAGNOSIS — K5730 Diverticulosis of large intestine without perforation or abscess without bleeding: Secondary | ICD-10-CM | POA: Diagnosis not present

## 2015-08-31 DIAGNOSIS — R634 Abnormal weight loss: Secondary | ICD-10-CM | POA: Diagnosis not present

## 2015-09-14 DIAGNOSIS — H40033 Anatomical narrow angle, bilateral: Secondary | ICD-10-CM | POA: Diagnosis not present

## 2015-09-14 DIAGNOSIS — H40013 Open angle with borderline findings, low risk, bilateral: Secondary | ICD-10-CM | POA: Diagnosis not present

## 2015-09-14 DIAGNOSIS — Z961 Presence of intraocular lens: Secondary | ICD-10-CM | POA: Diagnosis not present

## 2015-09-14 DIAGNOSIS — H353130 Nonexudative age-related macular degeneration, bilateral, stage unspecified: Secondary | ICD-10-CM | POA: Diagnosis not present

## 2015-09-21 DIAGNOSIS — M5136 Other intervertebral disc degeneration, lumbar region: Secondary | ICD-10-CM | POA: Diagnosis not present

## 2015-09-21 DIAGNOSIS — M4806 Spinal stenosis, lumbar region: Secondary | ICD-10-CM | POA: Diagnosis not present

## 2015-09-21 DIAGNOSIS — M0589 Other rheumatoid arthritis with rheumatoid factor of multiple sites: Secondary | ICD-10-CM | POA: Diagnosis not present

## 2015-09-21 DIAGNOSIS — M150 Primary generalized (osteo)arthritis: Secondary | ICD-10-CM | POA: Diagnosis not present

## 2015-09-23 DIAGNOSIS — M0589 Other rheumatoid arthritis with rheumatoid factor of multiple sites: Secondary | ICD-10-CM | POA: Diagnosis not present

## 2015-09-28 DIAGNOSIS — M810 Age-related osteoporosis without current pathological fracture: Secondary | ICD-10-CM | POA: Insufficient documentation

## 2015-09-28 DIAGNOSIS — K120 Recurrent oral aphthae: Secondary | ICD-10-CM | POA: Insufficient documentation

## 2015-09-28 DIAGNOSIS — Z87898 Personal history of other specified conditions: Secondary | ICD-10-CM | POA: Insufficient documentation

## 2015-09-28 DIAGNOSIS — H269 Unspecified cataract: Secondary | ICD-10-CM | POA: Insufficient documentation

## 2015-09-28 DIAGNOSIS — F418 Other specified anxiety disorders: Secondary | ICD-10-CM | POA: Insufficient documentation

## 2015-09-28 DIAGNOSIS — H34213 Partial retinal artery occlusion, bilateral: Secondary | ICD-10-CM

## 2015-09-28 DIAGNOSIS — Z8673 Personal history of transient ischemic attack (TIA), and cerebral infarction without residual deficits: Secondary | ICD-10-CM | POA: Insufficient documentation

## 2015-09-28 DIAGNOSIS — L602 Onychogryphosis: Secondary | ICD-10-CM | POA: Insufficient documentation

## 2015-09-28 DIAGNOSIS — I499 Cardiac arrhythmia, unspecified: Secondary | ICD-10-CM | POA: Insufficient documentation

## 2015-09-28 DIAGNOSIS — E876 Hypokalemia: Secondary | ICD-10-CM | POA: Insufficient documentation

## 2015-09-28 DIAGNOSIS — G629 Polyneuropathy, unspecified: Secondary | ICD-10-CM

## 2015-09-28 DIAGNOSIS — R634 Abnormal weight loss: Secondary | ICD-10-CM | POA: Insufficient documentation

## 2015-09-28 DIAGNOSIS — L219 Seborrheic dermatitis, unspecified: Secondary | ICD-10-CM | POA: Insufficient documentation

## 2015-09-28 DIAGNOSIS — E559 Vitamin D deficiency, unspecified: Secondary | ICD-10-CM | POA: Insufficient documentation

## 2015-09-28 HISTORY — DX: Partial retinal artery occlusion, bilateral: H34.213

## 2015-09-28 HISTORY — DX: Onychogryphosis: L60.2

## 2015-09-28 HISTORY — DX: Age-related osteoporosis without current pathological fracture: M81.0

## 2015-09-28 HISTORY — DX: Polyneuropathy, unspecified: G62.9

## 2015-09-29 DIAGNOSIS — G250 Essential tremor: Secondary | ICD-10-CM

## 2015-09-29 HISTORY — DX: Essential tremor: G25.0

## 2015-10-08 DIAGNOSIS — R109 Unspecified abdominal pain: Secondary | ICD-10-CM | POA: Diagnosis not present

## 2015-10-08 DIAGNOSIS — R1084 Generalized abdominal pain: Secondary | ICD-10-CM | POA: Diagnosis not present

## 2015-10-11 DIAGNOSIS — R1084 Generalized abdominal pain: Secondary | ICD-10-CM | POA: Diagnosis not present

## 2015-10-11 DIAGNOSIS — R195 Other fecal abnormalities: Secondary | ICD-10-CM | POA: Diagnosis not present

## 2015-10-21 DIAGNOSIS — M0589 Other rheumatoid arthritis with rheumatoid factor of multiple sites: Secondary | ICD-10-CM | POA: Diagnosis not present

## 2015-10-22 ENCOUNTER — Other Ambulatory Visit: Payer: Self-pay | Admitting: Nurse Practitioner

## 2015-10-22 NOTE — Telephone Encounter (Signed)
Medication refill request: Premarin Vag. Cream  Last AEX:  No listed annual exam, last visit on 07-28-15 does show where she received a physical.  Next AEX: Not scheduled Last MMG (if hormonal medication request): 12-15-2010 Benign Bi-Rads 2 Refill authorized: Please approve or deny refill request.

## 2015-10-26 ENCOUNTER — Encounter: Payer: Self-pay | Admitting: Nurse Practitioner

## 2015-10-26 ENCOUNTER — Ambulatory Visit (INDEPENDENT_AMBULATORY_CARE_PROVIDER_SITE_OTHER): Payer: Medicare Other | Admitting: Nurse Practitioner

## 2015-10-26 VITALS — BP 134/80 | HR 56 | Ht 59.0 in | Wt 99.0 lb

## 2015-10-26 DIAGNOSIS — Z4689 Encounter for fitting and adjustment of other specified devices: Secondary | ICD-10-CM | POA: Diagnosis not present

## 2015-10-26 DIAGNOSIS — N813 Complete uterovaginal prolapse: Secondary | ICD-10-CM | POA: Diagnosis not present

## 2015-10-26 NOTE — Patient Instructions (Signed)
Recheck in 2.5 months

## 2015-10-26 NOTE — Progress Notes (Signed)
Subjective:   82 y.o. Widowed Black female G5P5000 here for pessary check.  Patient has been using following pessary style and size:  2 3/4" ring with support.  She describes the following issues with the pessary:  none.   Use of protective clothing such as Depends or pads Yes.  . Problems with protective clothing with rash No..  Constipation issues with use of pessary No..  She is not sexually active.     ROS:   no breast pain or new or enlarging lumps on self exam,  no abnormal bleeding, pelvic pain or discharge,   no dysuria, trouble voiding or hematuria. Compliant to use of vaginal cream Yes.   General Exam:    BP 134/80 mmHg  Pulse 56  Ht 4\' 11"  (1.499 m)  Wt 99 lb (44.906 kg)  BMI 19.98 kg/m2  LMP 08/14/1981  General appearance: alert, cooperative, appears stated age and no distress   Pelvic: External genitalia:  no lesions   Before pessary was removed No. prolapse over the pessary   In correct position Yes.                Urethra: normal appearing urethra with no masses, tenderness or lesions              Vagina: normal appearing vagina with normal color and discharge, no lesions.  There are No abrasions or ulcerations other than areas where pessary rub on both side of vault.  No bleeding.               Cervix: normal appearance Cervical lesions were not found   Bimanual Exam:  Uterus:  not examined                               Adnexa:    not indicated                             Pessary was removed without difficulty without using forceps.  Pessary was cleansed with Betadine.  Pessary was replaced. Patient tolerated procedure well.    Assessment :   SUI, Cystocele- symptomatic, Rectocele- asymptomatic        Recent episodes of diarrhea.     Use of pessary continued   Plan:    Return to office in 2.5 months for recheck.         Continue with vaginal estrogen  An After Visit Summary was printed and given to the patient.

## 2015-10-26 NOTE — Progress Notes (Signed)
Encounter reviewed by Dr. Mikeala Girdler Amundson C. Silva.  

## 2015-11-04 DIAGNOSIS — H04123 Dry eye syndrome of bilateral lacrimal glands: Secondary | ICD-10-CM | POA: Diagnosis not present

## 2015-11-04 DIAGNOSIS — M3501 Sicca syndrome with keratoconjunctivitis: Secondary | ICD-10-CM | POA: Diagnosis not present

## 2015-11-08 ENCOUNTER — Emergency Department (HOSPITAL_BASED_OUTPATIENT_CLINIC_OR_DEPARTMENT_OTHER)
Admission: EM | Admit: 2015-11-08 | Discharge: 2015-11-08 | Disposition: A | Discharge status: Home / Self Care | Payer: Medicare Other | Attending: Emergency Medicine | Admitting: Emergency Medicine

## 2015-11-08 ENCOUNTER — Encounter (HOSPITAL_BASED_OUTPATIENT_CLINIC_OR_DEPARTMENT_OTHER): Payer: Self-pay | Admitting: *Deleted

## 2015-11-08 DIAGNOSIS — I10 Essential (primary) hypertension: Secondary | ICD-10-CM | POA: Insufficient documentation

## 2015-11-08 DIAGNOSIS — M1990 Unspecified osteoarthritis, unspecified site: Secondary | ICD-10-CM | POA: Diagnosis not present

## 2015-11-08 DIAGNOSIS — B001 Herpesviral vesicular dermatitis: Secondary | ICD-10-CM

## 2015-11-08 DIAGNOSIS — H6123 Impacted cerumen, bilateral: Secondary | ICD-10-CM | POA: Insufficient documentation

## 2015-11-08 DIAGNOSIS — Z79899 Other long term (current) drug therapy: Secondary | ICD-10-CM | POA: Insufficient documentation

## 2015-11-08 DIAGNOSIS — E559 Vitamin D deficiency, unspecified: Secondary | ICD-10-CM | POA: Diagnosis not present

## 2015-11-08 DIAGNOSIS — M069 Rheumatoid arthritis, unspecified: Secondary | ICD-10-CM | POA: Diagnosis not present

## 2015-11-08 DIAGNOSIS — Z972 Presence of dental prosthetic device (complete) (partial): Secondary | ICD-10-CM | POA: Diagnosis not present

## 2015-11-08 DIAGNOSIS — Z8742 Personal history of other diseases of the female genital tract: Secondary | ICD-10-CM | POA: Insufficient documentation

## 2015-11-08 DIAGNOSIS — Z7982 Long term (current) use of aspirin: Secondary | ICD-10-CM | POA: Diagnosis not present

## 2015-11-08 DIAGNOSIS — Z8673 Personal history of transient ischemic attack (TIA), and cerebral infarction without residual deficits: Secondary | ICD-10-CM | POA: Insufficient documentation

## 2015-11-08 DIAGNOSIS — E039 Hypothyroidism, unspecified: Secondary | ICD-10-CM | POA: Insufficient documentation

## 2015-11-08 DIAGNOSIS — J029 Acute pharyngitis, unspecified: Secondary | ICD-10-CM | POA: Diagnosis present

## 2015-11-08 MED ORDER — VALACYCLOVIR HCL 1 G PO TABS: 2000.0000 mg | ORAL_TABLET | Freq: Two times a day (BID) | ORAL | Status: AC

## 2015-11-08 MED FILL — valACYclovir HCL 1 GM TABS: 1 | 2 days supply | Qty: 4 | Fill #0

## 2015-11-08 NOTE — ED Notes (Signed)
Directed To pharmacy to pick up medications

## 2015-11-08 NOTE — ED Notes (Signed)
MD at bedside. 

## 2015-11-08 NOTE — ED Notes (Signed)
Sore throat since eating something hot out of the microwave. The left side of her mouth hurts.

## 2015-11-08 NOTE — Discharge Instructions (Signed)
Take valtrex 2 g twice   See your doctor   Avoid hot foods.   Return to Er if you have worse ear pain, jaw pain, fever, sore throat, trouble eating or swallowing.

## 2015-11-08 NOTE — ED Provider Notes (Signed)
CSN: [id number]     Arrival date & time 11/08/15  1313 History  By signing my name below, I, Iona Beard, attest that this documentation has been prepared under the direction and in the presence of Richardean Canal, MD.   Electronically Signed: Iona Beard, ED Scribe. 11/08/2015. 3:22 PM   Chief Complaint  Patient presents with  . Sore Throat    The history is provided by the patient. No language interpreter was used.   HPI Comments: Anna Mccoy is a 82 y.o. female with PMHx of HTN who presents to the Emergency Department complaining of gradual onset, left sided mouth pain, onset three or four days ago. Pt reports associated sore throat. No other associated symptoms noted. She states that she ate very hot food last week and thinks it may be contributing to her symptoms. She used salt water rinses at home with no relief to symptoms. No other worsening or alleviating factors noted. Pt denies trouble swallowing,difficulty eating, loss of appetite, or any other pertinent symptoms.  Past Medical History  Diagnosis Date  . Abnormal EKG   . Hypertension   . Thyroid disease     HYPOTHYROIDISM  . Rheumatoid arthritis(714.0)   . Hx-TIA (transient ischemic attack)   . Chest pain     H/O  . Fatigue   . RBBB   . Aortic valve sclerosis   . Mitral valve regurgitation     TRIVIAL  . Tricuspid valve regurgitation     MILD  . Procidentia of uterus     uses pessary  . Menopausal state age 44  . Vitamin D deficiency disease   . DDD (degenerative disc disease), lumbar   . Post-menopausal bleeding 02/2011    Endo Biopsy 7/12 benign, no hyperplasia  . Arthritis    Past Surgical History  Procedure Laterality Date  . Cholecystectomy, laparoscopic  2002  . Laminectomy and microdiscectomy lumbar spine  01/23/09  . Colonoscopy w/ biopsies  6/09    sigmoid diverticuli recheck 5 years  . Vaginal delivery      x5  . Eye surgery  04/24/14    Cataract sx in right eye with lens  replacement.   Family History  Problem Relation Age of Onset  . Hypertension Mother   . Stroke Mother   . Hypertension Sister   . Diabetes Sister   . Hypertension Sister   . Diabetes Sister    Social History  Substance Use Topics  . Smoking status: Never Smoker   . Smokeless tobacco: Never Used  . Alcohol Use: No   OB History    Gravida Para Term Preterm AB TAB SAB Ectopic Multiple Living   5 5 5             Review of Systems  HENT: Positive for dental problem and sore throat.   All other systems reviewed and are negative.  Allergies  Ciprofloxacin; Lidocaine; Neosporin; and Sulfa antibiotics  Home Medications   Prior to Admission medications   Medication Sig Start Date End Date Taking? Authorizing Provider  abatacept (ORENCIA) 250 MG injection Inject into the vein every 30 (thirty) days.     Historical Provider, MD  acetaminophen (TYLENOL) 500 MG tablet Take 500 mg by mouth as needed for mild pain.     Historical Provider, MD  amLODipine (NORVASC) 10 MG tablet Take 10 mg by mouth daily.    Historical Provider, MD  aspirin 81 MG tablet Take 81 mg by mouth every 3 (three) days.  Historical Provider, MD  atorvastatin (LIPITOR) 40 MG tablet Take 1 tablet (40 mg total) by mouth daily at 6 PM. 02/05/15   Rhetta Mura, MD  conjugated estrogens (PREMARIN) vaginal cream USE VAGINALLY 3 TIMES WEEKLY 04/06/15   Ria Comment, FNP  cycloSPORINE (RESTASIS) 0.05 % ophthalmic emulsion Place 1 drop into both eyes 2 (two) times daily.    Historical Provider, MD  divalproex (DEPAKOTE) 250 MG DR tablet Take 1 tablet (250 mg total) by mouth every 12 (twelve) hours. 02/05/15   Rhetta Mura, MD  folic acid (FOLVITE) 1 MG tablet Take 1 mg by mouth daily.    Historical Provider, MD  IRON PO Take 1 tablet by mouth daily.    Historical Provider, MD  leflunomide (ARAVA) 20 MG tablet Take 20 mg by mouth every other day.    Historical Provider, MD  levothyroxine (SYNTHROID, LEVOTHROID)  75 MCG tablet Take 75 mcg by mouth daily before breakfast.    Historical Provider, MD  metroNIDAZOLE (METROGEL) 0.75 % vaginal gel Place one applicator at night for 5 days. 02/23/15   Ria Comment, FNP  metroNIDAZOLE (METROGEL) 0.75 % vaginal gel Place 1 Applicatorful vaginally at bedtime. For 3-5 nights 07/28/15   Ria Comment, FNP  Multiple Vitamins-Minerals (OCUVITE PRESERVISION PO) Take by mouth.    Historical Provider, MD  Polyethyl Glycol-Propyl Glycol (SYSTANE OP) Place 1 drop into both eyes 4 (four) times daily.    Historical Provider, MD  Polyethyl Glycol-Propyl Glycol (SYSTANE) 0.4-0.3 % SOLN Place 1 drop into both eyes daily.    Historical Provider, MD  potassium chloride SA (K-DUR,KLOR-CON) 20 MEQ tablet Take 20 mEq by mouth 2 (two) times daily.    Historical Provider, MD  PREMARIN vaginal cream APPLY VAGINALLY 3 TIMES A WEEK 10/25/15   Ria Comment, FNP  valsartan-hydrochlorothiazide (DIOVAN-HCT) 160-25 MG per tablet Take 1 tablet by mouth daily.    Historical Provider, MD  Vitamin D, Ergocalciferol, (DRISDOL) 50000 UNITS CAPS Take 50,000 Units by mouth every 14 (fourteen) days.    Historical Provider, MD   BP 147/77 mmHg  Pulse 69  Temp(Src) 98.1 F (36.7 C) (Oral)  Resp 18  Ht 4\' 9"  (1.448 m)  Wt 99 lb (44.906 kg)  BMI 21.42 kg/m2  SpO2 100%  LMP 08/14/1981 Physical Exam  Constitutional: She is oriented to person, place, and time. She appears well-developed and well-nourished. No distress.  HENT:  Head: Normocephalic and atraumatic.  Bilateral cerumen impaction.  Upper and lower dentures noted and removed for examination.  Vesicles on roof of mouth. No signs of cellulitis.  Posterior pharynx is normal. Small vesicles on left, back molar area.   Eyes: EOM are normal.  Neck: Normal range of motion.  Cardiovascular: Normal rate, regular rhythm and normal heart sounds.   Pulmonary/Chest: Effort normal and breath sounds normal.  Abdominal: Soft. She exhibits no  distension. There is no tenderness.  Musculoskeletal: Normal range of motion.  Lymphadenopathy:    She has no cervical adenopathy.  Neurological: She is alert and oriented to person, place, and time.  Skin: Skin is warm and dry.  Psychiatric: She has a normal mood and affect. Judgment normal.  Nursing note and vitals reviewed.   ED Course  Procedures (including critical care time) DIAGNOSTIC STUDIES: Oxygen Saturation is 100% on RA, normal by my interpretation.    COORDINATION OF CARE: 3:28 PM-Discussed treatment plan with pt at bedside and pt agreed to plan.   Labs Review Labs Reviewed - No data to display  Imaging Review No results found.   EKG Interpretation None      MDM   Final diagnoses:  None   Anna Mccoy is a 82 y.o. female here with vesicles in the mouth. Likely cold sores vs small burn from hot food. Posterior pharynx normal. No cervical LAD. Well appearing. No trouble swallowing. Will dc home with valtrex x 2 doses. Recommend avoid hot foods.    I personally performed the services described in this documentation, which was scribed in my presence. The recorded information has been reviewed and is accurate.    Richardean Canal, MD 11/08/15 1540

## 2015-11-08 NOTE — ED Notes (Addendum)
Pt states she noticed a sore in her mouth last week. Dentures removed for Dr. Silverio Lay to examine. Vesicles noted on roof of mouth and around molar.Marland Kitchen

## 2015-11-11 ENCOUNTER — Encounter: Payer: Self-pay | Admitting: Neurology

## 2015-11-11 ENCOUNTER — Ambulatory Visit (INDEPENDENT_AMBULATORY_CARE_PROVIDER_SITE_OTHER): Payer: Medicare Other | Admitting: Neurology

## 2015-11-11 VITALS — BP 132/65 | HR 57 | Ht 59.0 in | Wt 98.6 lb

## 2015-11-11 DIAGNOSIS — M7989 Other specified soft tissue disorders: Secondary | ICD-10-CM

## 2015-11-11 NOTE — Progress Notes (Signed)
[facility]    Provider:  Dr Lucia Gaskins Referring Provider: [name], MD Primary Care Physician:  Pamelia Hoit, MD  CC:  Stroke  HPI:  Dr Lucia Gaskins 02/11/2015 :  Anna Mccoy is a 82 y.o. female here as a referral from Dr. Andrey Campanile for follow up of stroke.  She has a PMHx of LBP, lumbar spinal stenosis s/p surgery 2010, right foot numbness, HTN, hypothyroidism, RA who presented to ED on 01/22/2015 complaining of left sided temporal and maxillary stabbing and lancinating pain. She was on Plavix daily and aspirin 81 mg every 3 days. Neurologic exam nonfocal. MRI of the brain demonstrated areas of acute infarction in the left posterior frontal and parietal cortex areas. Also showed small left occipital subdural hemorrhage. She was admitted to [facility] for stroke workup. The headache was acute in onset and be going on for about a week. Symptoms were described as stabbing and intermittent without photophobia, phonophobia, nausea, vomiting, visual changes, or autonomic features.  She is here with her 2 daughters who provide most of the information. She feels she is doing well. Not back to her baseline but doing better. She is taking care of herself, daughter taking good care of her and she is at peace. She has no had a headache since leaving the hospital. She sometimes has some difficulty explaining things, she gets off track but no confusional spells. She may ask the same questions twice such as about medicine but patient says she just wants to make sure she takes it correctly. She lives wither two daughters and her sister that is 70. No more seizures or seizure like episodes. No aphasia. Her vision is not as clear as it was prior to the stroke. She had cataract removal in May and improved but worsened after the stroke. She notes that this morning she was able to read better and she was able to read fine print and she is very happy about that. She had PT yesterday and she is  doing very well, and she is going to start occupational Therapy. She is taking the Depakote and is not having any side effects. She is taking aspirin every 3 days (should be taking every day). She is still on the Plavix.   Per daughters: They got home this past Friday and she is having coordination issues, reaching out for objects and thinks she is grabbing them but isn't. Her vision is blurry since she has been home. Vision is worsening, she can't recognize people in pictures on the mantle because she can't see them clearly anymore. She is more forgetful since being home. Patient is asking the same questions. She is getting more things mixed up but not altered in any way. Daughters are worried she has had a repeat stroke, the symptoms started happening after the last MRI of the brain.  Reviewed notes, labs and imaging from outside physicians, which showed: She presented to the ED on 01/22/2015 complaining of left sided temporal and maxillary stabbing and lancinating pain. She was on Plavix daily and aspirin 81 mg every 3 days. Neurologic exam nonfocal. MRI of the brain demonstrated areas of acute infarction in the left posterior frontal and parietal cortex areas. Also showed left occipital subdural hemorrhage unclear if it was secondary to a fall 1 week prior versus meningeal thickening secondary to rheumatoid arthritis. The left posterior frontal and parietal multiple small infarcts were thought to be secondary to small vessel disease versus rheumatoid vasculitis(personally reviewed images). MRA was unremarkable.  She was admitted to [facility] for stroke workup. While inpatient, patient had a seizure with right leg and hand shaking and expressive aphasia. She was started on Depakote with good results for both the seizures and the headache.  LDL was 120, statin was started hgba1c 5.5 Cbc with mild anemia Bmp with slightlky elevated gfr  EEG: This is an abnormal electroencephalogram secondary to left  temporal slowing and rare left temporal sharp transients. This is consistent with the patient's history of left hemispheric acute infarcts seen on MRI.   MRI w/contrast: IMPRESSION: Smooth dural thickening and enhancement overlying the left cerebral hemisphere as above. This is felt to most likely be reactive in nature due to the presence of the left subdural collection as identified on previous MRI. The previously identified left occipital collection itself does not enhance, and most likely reflects a small subacute subdural hemorrhage related to recent fall/ trauma.  MRI HEAD FINDINGS WO CONTRAST  Subcentimeter areas of acute infarction affect the LEFT posterior frontal and parietal cortex near the vertex.  No hemorrhage, mass lesion, hydrocephalus, or extra-axial fluid. Generalized atrophy. Moderately advanced chronic microvascular ischemic change. Flow voids are preserved. No midline abnormality. No osseous findings. Extracranial soft tissues unremarkable. Moderate retention cyst formation RIGHT maxillary sinus. Negative orbits.  Compared with prior CT, the infarcts are not visible.  MRA HEAD FINDINGS  Dolichoectatic but widely patent internal carotid arteries. Basilar artery widely patent with both vertebrals contributing. New no proximal stenosis of the anterior, middle, or posterior cerebral arteries. No MCA branch occlusion. No intracranial aneurysm.  IMPRESSION: Subcentimeter foci of restricted diffusion in the LEFT posterior frontal and parietal cortex consistent with multiple small infarcts. These are nonhemorrhagic. See discussion above.  Atrophy and small vessel disease.  No proximal flow reducing lesion is evident.  Echo: The cavity size was normal. Systolic function was normal. The estimated ejection fraction was in the range of 55% to 60%. Wall motion was normal; there were no regional wall motion abnormalities.  Carotids: Findings  suggest 1-39% internal carotid artery stenosis bilaterally. Vertebral arteries are patent with antegrade flow. Questionable history of atrial fibrillation Irregular heartbeat per patient, none appreciated on exam or monitoring. Not on anticoagulation prior to admission  Update 05/12/2015 : She returns for follow-up after last visit 3 months ago with Dr. Daisy Blossom. She is doing well without recurrent seizures or strokelike episodes. She is tolerating Depakote well without significant side effects except possible minor and tremors which are not functionally disabling. She remains on aspirin 81 mg is tolerating well with out significant bleeding or bruising. Her blood pressure is usually well controlled though it is slightly elevated at 154/67 in office today. She had follow-up MRI scan of the brain done on 03/04/15 which I personally reviewed shows changes of small vessel disease and mild atrophy. The previously noted small subcutaneous is not as well appreciated. Patient is overall doing well and has had no new complaints. Update 11/11/2015 : She returns for follow-up after last visit 6 months ago. She continues to do well without significant recurrent strokes or TIAs. She is tolerating aspirin well without bleeding or bruising. She states her blood pressure is well controlled and today it is 132/65. She is tolerating Lipitor well without myalgias or arthralgias. She is complaining of swelling in her feet and she doesn't know the reason. She has an upcoming visit with her primary physician and advise her to discuss possibly stopping the Norvasc which may be contributing. Review of Systems: Patient complains  of symptoms per HPI as well as the following symptoms:  Light sensitivity, , diarrhea, leg swelling, tremors and all other systems negative.   Social History   Social History  . Marital Status: Widowed    Spouse Name: N/A  . Number of Children: 5  . Years of Education: 12   Occupational History  .  Retired     Social History Main Topics  . Smoking status: Never Smoker   . Smokeless tobacco: Never Used  . Alcohol Use: No  . Drug Use: No  . Sexual Activity: No     Comment: Widowed 01/10/07 age 64   Other Topics Concern  . Not on file   Social History Narrative   Patient is right handed, resides with daughter and sister.    Caffeine use: Coffee (drinks 1/2-1 cup per day)    Family History  Problem Relation Age of Onset  . Hypertension Mother   . Stroke Mother   . Hypertension Sister   . Diabetes Sister   . Hypertension Sister   . Diabetes Sister     Past Medical History  Diagnosis Date  . Abnormal EKG   . Hypertension   . Thyroid disease     HYPOTHYROIDISM  . Rheumatoid arthritis(714.0)   . Hx-TIA (transient ischemic attack)   . Chest pain     H/O  . Fatigue   . RBBB   . Aortic valve sclerosis   . Mitral valve regurgitation     TRIVIAL  . Tricuspid valve regurgitation     MILD  . Procidentia of uterus     uses pessary  . Menopausal state age 67  . Vitamin D deficiency disease   . DDD (degenerative disc disease), lumbar   . Post-menopausal bleeding 02/2011    Endo Biopsy 7/12 benign, no hyperplasia  . Arthritis   . Stroke [facility])     Past Surgical History  Procedure Laterality Date  . Cholecystectomy, laparoscopic  2002  . Laminectomy and microdiscectomy lumbar spine  01/23/09  . Colonoscopy w/ biopsies  6/09    sigmoid diverticuli recheck 5 years  . Vaginal delivery      x5  . Eye surgery  04/24/14    Cataract sx in right eye with lens replacement.    Current Outpatient Prescriptions  Medication Sig Dispense Refill  . abatacept (ORENCIA) 250 MG injection Inject into the vein every 30 (thirty) days.     Marland Kitchen acetaminophen (TYLENOL) 500 MG tablet Take 500 mg by mouth as needed for mild pain.     Marland Kitchen amLODipine (NORVASC) 10 MG tablet Take 10 mg by mouth daily.    Marland Kitchen aspirin 81 MG tablet Take 81 mg by mouth every 3 (three) days.     Marland Kitchen atenolol (TENORMIN)  50 MG tablet TK 1 T OP D FOR TREMOR  0  . atorvastatin (LIPITOR) 40 MG tablet Take 1 tablet (40 mg total) by mouth daily at 6 PM. 30 tablet 0  . conjugated estrogens (PREMARIN) vaginal cream USE VAGINALLY 3 TIMES WEEKLY 90 g 3  . cycloSPORINE (RESTASIS) 0.05 % ophthalmic emulsion Place 1 drop into both eyes 2 (two) times daily.    . divalproex (DEPAKOTE) 250 MG DR tablet Take 1 tablet (250 mg total) by mouth every 12 (twelve) hours. 60 tablet 0  . folic acid (FOLVITE) 1 MG tablet Take 1 mg by mouth daily.    . IRON PO Take 1 tablet by mouth daily.    Marland Kitchen leflunomide (ARAVA)  20 MG tablet Take 20 mg by mouth every other day.    . levothyroxine (SYNTHROID, LEVOTHROID) 75 MCG tablet Take 75 mcg by mouth daily before breakfast.    . metroNIDAZOLE (METROGEL) 0.75 % vaginal gel Place one applicator at night for 5 days. (Patient taking differently: Mon, wed Fri) 70 g 0  . Multiple Vitamins-Minerals (OCUVITE PRESERVISION PO) Take by mouth.    Marland Kitchen POLY-IRON 150 150 MG capsule   0  . Polyethyl Glycol-Propyl Glycol (SYSTANE OP) Place 1 drop into both eyes 4 (four) times daily.    Bertram Gala Glycol-Propyl Glycol (SYSTANE) 0.4-0.3 % SOLN Place 1 drop into both eyes daily.    . potassium chloride SA (K-DUR,KLOR-CON) 20 MEQ tablet Take 20 mEq by mouth 2 (two) times daily.    Marland Kitchen PREMARIN vaginal cream APPLY VAGINALLY 3 TIMES A WEEK 30 g 3  . valACYclovir (VALTREX) 1000 MG tablet Take 2 tablets (2,000 mg total) by mouth 2 (two) times daily. 4 tablet 0  . valsartan-hydrochlorothiazide (DIOVAN-HCT) 160-25 MG per tablet Take 1 tablet by mouth daily.    . Vitamin D, Ergocalciferol, (DRISDOL) 50000 UNITS CAPS Take 50,000 Units by mouth every 14 (fourteen) days.     No current facility-administered medications for this visit.    Allergies as of 11/11/2015 - Review Complete 11/11/2015  Allergen Reaction Noted  . Ciprofloxacin Diarrhea 06/09/2013  . Lidocaine  11/10/2011  . Neosporin [neomycin-bacitracin zn-polymyx]   12/03/2012  . Sulfa antibiotics  11/10/2011    Vitals: BP 132/65 mmHg  Pulse 57  Ht 4\' 11"  (1.499 m)  Wt 98 lb 9.6 oz (44.725 kg)  BMI 19.90 kg/m2  LMP 08/14/1981 Last Weight:  Wt Readings from Last 1 Encounters:  11/11/15 98 lb 9.6 oz (44.725 kg)   Last Height:   Ht Readings from Last 1 Encounters:  11/11/15 4\' 11"  (1.499 m)   Physical exam: Exam: Gen: Frail petite Caucasian lady. She has marked kyphosis, conversant, well nourised, well groomed                     CV: RRR, no MRG. No Carotid Bruits. No peripheral edema, warm, nontender Eyes: Conjunctivae clear without exudates or hemorrhage  Neuro: Detailed Neurologic Exam  Speech:    Speech is normal; fluent and spontaneous with normal comprehension.  Cognition:    The patient is oriented to person, place, and time;     recent and remote memory appear  intact;     language fluent;     normal attention, concentration,     fund of knowledge appears intact Cranial Nerves:    The pupils are equal, round, and reactive to light. The fundi are flat. Visual fields are full to finger confrontation. Extraocular movements are intact. Trigeminal sensation is intact and the muscles of mastication are normal. The face is symmetric. The palate elevates in the midline. Hearing intact. Voice is normal. Shoulder shrug is normal. The tongue has normal motion without fasciculations.   Coordination:    No dysmetria   Gait:   Uses a walker, no ataxia  Motor Observation:    No asymmetry, no atrophy, and no involuntary movements noted. Tone:    Normal muscle tone.    Posture:    Posture is slightly stooped    Strength:    Strength is V/V in the upper and lower limbs.      Sensation: intact to LT     Reflex Exam:  DTR's:    Deep tendon  reflexes in the upper and lower extremities are symmetrical bilaterally.   Toes:    The toes are downgoing bilaterally.   Clonus:    Clonus is absent.  Assessment/Plan:  Ms. AVAMARIE TOSCANO is a 82 y.o. female with history of hypertension, hypothyroidism and   small left occipital subdural hemorrhage, left posterior frontal and parietal multiple small vessel disease lacunar infarcts. Had episode of right-sided motor seizure and aphasia.     I had a long d/w patient about her remote stroke, risk for recurrent stroke/TIAs, personally independently reviewed imaging studies and stroke evaluation results and answered questions.Continue aspirin 81 mg daily  for secondary stroke prevention and maintain strict control of hypertension with blood pressure goal below 130/90,  and lipids with LDL cholesterol goal below 70 mg/dL. I also advised the patient to eat a healthy diet with plenty of whole grains, cereals, fruits and vegetables, exercise regularly and maintain ideal body weight Followup in the future with me in  1 year or call earlier if necessary Delia Heady, MD  [facility] [address] [place], [place] [address]  Phone [phone] Fax [phone]

## 2015-11-11 NOTE — Patient Instructions (Signed)
I had a long d/w patient about her remote stroke, risk for recurrent stroke/TIAs, personally independently reviewed imaging studies and stroke evaluation results and answered questions.Continue aspirin 81 mg daily  for secondary stroke prevention and maintain strict control of hypertension with blood pressure goal below 130/90,  and lipids with LDL cholesterol goal below 70 mg/dL. I also advised the patient to eat a healthy diet with plenty of whole grains, cereals, fruits and vegetables, exercise regularly and maintain ideal body weight Followup in the future with me in  1 year or call earlier if necessary

## 2015-11-24 DIAGNOSIS — M0589 Other rheumatoid arthritis with rheumatoid factor of multiple sites: Secondary | ICD-10-CM | POA: Diagnosis not present

## 2015-12-04 DIAGNOSIS — K529 Noninfective gastroenteritis and colitis, unspecified: Secondary | ICD-10-CM | POA: Insufficient documentation

## 2015-12-04 HISTORY — DX: Noninfective gastroenteritis and colitis, unspecified: K52.9

## 2015-12-13 ENCOUNTER — Telehealth: Payer: Self-pay | Admitting: Nurse Practitioner

## 2015-12-13 NOTE — Telephone Encounter (Signed)
Patient's daughter "Odelia Gage" is calling to talk with Cherlyn Cushing FNP nurse. No details given.

## 2015-12-13 NOTE — Telephone Encounter (Signed)
Spoke with patient's daughter [name], okay per ROI. Daughter states that she is very concerned about her mothers weight loss and recurring diarrhea. States that the patient has seen her PCP and Dr.Mann for evaluation regarding these symptoms. Patient had a colonoscopy which was negative per daughter. Daughter is concerned as she feels her mother's health problems have not been addressed. Asking if Ria Comment, FNP has any recommendation or can refer the patient to a physician who specializes in the care of aging adults. Advised I will speak with Ria Comment, FNP and return call with further recommendations. She is agreeable.

## 2015-12-14 ENCOUNTER — Telehealth: Payer: Self-pay

## 2015-12-14 NOTE — Telephone Encounter (Signed)
Spoke with patient. Advised of message as seen below from Ria Comment, FNP. She is agreeable and verbalizes understanding. She would like to schedule her mother to see Bufford Spikes, DO at this time. Contact information provided to Bufford Spikes, DO who is with [facility] at [phone]. [name] is agreeable and will contact their facility to get her mother scheduled. She will return call if she needs any assistance.  Routing to provider for final review. Patient agreeable to disposition. Will close encounter.

## 2015-12-14 NOTE — Telephone Encounter (Signed)
Patient's daughter Anna Mccoy returned call, okay to speak with daughter per ROI. Anna Mccoy states that she called to schedule an appointment with Bufford Spikes, DO but she is not accepting new patients at this time. Asking if Anna Comment, FNP has any other providers she would recommend. States that her mother is not interested in [facility] as she wants to continue to be able to see Anna Comment, FNP and her other providers as needed. Advised I will speak with Anna Comment, FNP and return call with further recommendations. She is agreeable.

## 2015-12-14 NOTE — Telephone Encounter (Signed)
I discussed her case with Dr. Edward Jolly yesterday afternoon and she gave me a name of a Gerontologist who takes care of pt at [facility].  Her name is Dr. Bufford Spikes.  Another suggestion for Anna Mccoy to consider in the future is a [facility] program called [facility].  This organization helps with resources that can give assistance for health care maintenance.  But she would have to get all her care there and not have her own personal physicians.

## 2015-12-15 DIAGNOSIS — R5382 Chronic fatigue, unspecified: Secondary | ICD-10-CM | POA: Insufficient documentation

## 2015-12-16 DIAGNOSIS — M25473 Effusion, unspecified ankle: Secondary | ICD-10-CM | POA: Diagnosis not present

## 2015-12-16 DIAGNOSIS — K529 Noninfective gastroenteritis and colitis, unspecified: Secondary | ICD-10-CM | POA: Diagnosis not present

## 2015-12-16 DIAGNOSIS — I10 Essential (primary) hypertension: Secondary | ICD-10-CM | POA: Diagnosis not present

## 2015-12-16 DIAGNOSIS — R5382 Chronic fatigue, unspecified: Secondary | ICD-10-CM | POA: Diagnosis not present

## 2015-12-16 DIAGNOSIS — E559 Vitamin D deficiency, unspecified: Secondary | ICD-10-CM | POA: Diagnosis not present

## 2015-12-16 DIAGNOSIS — E039 Hypothyroidism, unspecified: Secondary | ICD-10-CM | POA: Diagnosis not present

## 2015-12-18 ENCOUNTER — Encounter (HOSPITAL_BASED_OUTPATIENT_CLINIC_OR_DEPARTMENT_OTHER): Payer: Self-pay | Admitting: Emergency Medicine

## 2015-12-18 ENCOUNTER — Emergency Department (HOSPITAL_BASED_OUTPATIENT_CLINIC_OR_DEPARTMENT_OTHER): Payer: Medicare Other

## 2015-12-18 ENCOUNTER — Emergency Department (HOSPITAL_BASED_OUTPATIENT_CLINIC_OR_DEPARTMENT_OTHER)
Admission: EM | Admit: 2015-12-18 | Discharge: 2015-12-18 | Disposition: A | Discharge status: Home / Self Care | Payer: Medicare Other | Attending: Emergency Medicine | Admitting: Emergency Medicine

## 2015-12-18 DIAGNOSIS — I10 Essential (primary) hypertension: Secondary | ICD-10-CM | POA: Diagnosis not present

## 2015-12-18 DIAGNOSIS — Z8673 Personal history of transient ischemic attack (TIA), and cerebral infarction without residual deficits: Secondary | ICD-10-CM | POA: Insufficient documentation

## 2015-12-18 DIAGNOSIS — Z79899 Other long term (current) drug therapy: Secondary | ICD-10-CM | POA: Insufficient documentation

## 2015-12-18 DIAGNOSIS — R0602 Shortness of breath: Secondary | ICD-10-CM | POA: Diagnosis not present

## 2015-12-18 DIAGNOSIS — R600 Localized edema: Secondary | ICD-10-CM | POA: Insufficient documentation

## 2015-12-18 DIAGNOSIS — M069 Rheumatoid arthritis, unspecified: Secondary | ICD-10-CM | POA: Diagnosis not present

## 2015-12-18 DIAGNOSIS — R05 Cough: Secondary | ICD-10-CM | POA: Diagnosis not present

## 2015-12-18 DIAGNOSIS — E039 Hypothyroidism, unspecified: Secondary | ICD-10-CM | POA: Diagnosis not present

## 2015-12-18 DIAGNOSIS — M1990 Unspecified osteoarthritis, unspecified site: Secondary | ICD-10-CM | POA: Diagnosis not present

## 2015-12-18 LAB — BRAIN NATRIURETIC PEPTIDE: B Natriuretic Peptide: 109.9 pg/mL — ABNORMAL HIGH (ref 0.0–100.0)

## 2015-12-18 LAB — CBC
HCT: 32.4 % — ABNORMAL LOW (ref 36.0–46.0)
Hemoglobin: 10.9 g/dL — ABNORMAL LOW (ref 12.0–15.0)
MCH: 32.9 pg (ref 26.0–34.0)
MCHC: 33.6 g/dL (ref 30.0–36.0)
MCV: 97.9 fL (ref 78.0–100.0)
PLATELETS: 228 10*3/uL (ref 150–400)
RBC: 3.31 MIL/uL — ABNORMAL LOW (ref 3.87–5.11)
RDW: 15.7 % — AB (ref 11.5–15.5)
WBC: 8.1 10*3/uL (ref 4.0–10.5)

## 2015-12-18 LAB — BASIC METABOLIC PANEL
ANION GAP: 5 (ref 5–15)
BUN: 11 mg/dL (ref 6–20)
CALCIUM: 8.3 mg/dL — AB (ref 8.9–10.3)
CO2: 26 mmol/L (ref 22–32)
CREATININE: 0.8 mg/dL (ref 0.44–1.00)
Chloride: 106 mmol/L (ref 101–111)
GFR calc Af Amer: >60 mL/min (ref >60)
GLUCOSE: 79 mg/dL (ref 65–99)
Potassium: 3.8 mmol/L (ref 3.5–5.1)
Sodium: 137 mmol/L (ref 135–145)

## 2015-12-18 LAB — TROPONIN I

## 2015-12-18 LAB — PROTIME-INR
INR: 0.91 (ref 0.00–1.49)
PROTHROMBIN TIME: 12.5 s (ref 11.6–15.2)

## 2015-12-18 NOTE — Discharge Instructions (Signed)
We did not find fluid on the lung or infection in the lung. Your EKG is not changed from prior. Please call your PCP Monday to set up close follow-up. In the meantime, keep legs elevated at rest and use compression stockings. Your doctor may want to do a blood pressure recheck on the next visit (after cutting down on the blood pressure medication) to start a possible fluid pill. Return without fail for worsening symptoms, including difficulty breathing, passing out, chest pain, confusion, fevers, or any other symptoms concerning to you.  Peripheral Edema You have swelling in your legs (peripheral edema). This swelling is due to excess accumulation of salt and water in your body. Edema may be a sign of heart, kidney or liver disease, or a side effect of a medication. It may also be due to problems in the leg veins. Elevating your legs and using special support stockings may be very helpful, if the cause of the swelling is due to poor venous circulation. Avoid long periods of standing, whatever the cause. Treatment of edema depends on identifying the cause. Chips, pretzels, pickles and other salty foods should be avoided. Restricting salt in your diet is almost always needed. Water pills (diuretics) are often used to remove the excess salt and water from your body via urine. These medicines prevent the kidney from reabsorbing sodium. This increases urine flow. Diuretic treatment may also result in lowering of potassium levels in your body. Potassium supplements may be needed if you have to use diuretics daily. Daily weights can help you keep track of your progress in clearing your edema. You should call your caregiver for follow up care as recommended. SEEK IMMEDIATE MEDICAL CARE IF:   You have increased swelling, pain, redness, or heat in your legs.  You develop shortness of breath, especially when lying down.  You develop chest or abdominal pain, weakness, or fainting.  You have a fever.   This  information is not intended to replace advice given to you by your health care provider. Make sure you discuss any questions you have with your health care provider.   Document Released: 09/07/2004 Document Revised: 10/23/2011 Document Reviewed: 02/10/2015 Elsevier Interactive Patient Education [employer]

## 2015-12-18 NOTE — ED Notes (Signed)
Pt seen by pmd 2 days ago for fatigue, cough, leg swelling.  Was called this morning by pmd office and told to come to the ED to have CXR and EKG due to abnormal labs.

## 2015-12-18 NOTE — ED Notes (Signed)
Pt and family given d/c instructions. Verbalizes understanding. No questions. 

## 2015-12-18 NOTE — ED Provider Notes (Signed)
CSN: [id number]     Arrival date & time 12/18/15  1231 History   First MD Initiated Contact with Patient 12/18/15 1312     Chief Complaint  Patient presents with  . sent by PMD for cxr and EKG      (Consider location/radiation/quality/duration/timing/severity/associated sxs/prior Treatment) HPI 82 year old female who presents with lower extremity edema. She has a history of hypertension, hypothyroidism, prior CVA. History is primarily provided by patient's daughter who states that she has had progressively worsening lower extremity edema over the past few days. She has had long-standing diarrhea and has been off and on, improved with Imodium. Reports some fatigue, but has not noticed being more short of breath than usual.  Has had cough and congestion, with clear and yellow sputum but no chest pain. Denies any orthopnea or PND, fevers or chills. She has not had any nausea, vomiting, chest pain or abdominal pain. No dysuria, urinary frequency. No melena or hematochezia. Past Medical History  Diagnosis Date  . Abnormal EKG   . Hypertension   . Thyroid disease     HYPOTHYROIDISM  . Rheumatoid arthritis(714.0)   . Hx-TIA (transient ischemic attack)   . Chest pain     H/O  . Fatigue   . RBBB   . Aortic valve sclerosis   . Mitral valve regurgitation     TRIVIAL  . Tricuspid valve regurgitation     MILD  . Procidentia of uterus     uses pessary  . Menopausal state age 40  . Vitamin D deficiency disease   . DDD (degenerative disc disease), lumbar   . Post-menopausal bleeding 02/2011    Endo Biopsy 7/12 benign, no hyperplasia  . Arthritis   . Stroke [facility])    Past Surgical History  Procedure Laterality Date  . Cholecystectomy, laparoscopic  2002  . Laminectomy and microdiscectomy lumbar spine  01/23/09  . Colonoscopy w/ biopsies  6/09    sigmoid diverticuli recheck 5 years  . Vaginal delivery      x5  . Eye surgery  04/24/14    Cataract sx in right eye with lens replacement.    Family History  Problem Relation Age of Onset  . Hypertension Mother   . Stroke Mother   . Hypertension Sister   . Diabetes Sister   . Hypertension Sister   . Diabetes Sister    Social History  Substance Use Topics  . Smoking status: Never Smoker   . Smokeless tobacco: Never Used  . Alcohol Use: No   OB History    Gravida Para Term Preterm AB TAB SAB Ectopic Multiple Living   5 5 5             Review of Systems 10/14 systems reviewed and are negative other than those stated in the HPI    Allergies  Ciprofloxacin; Lidocaine; Neosporin; and Sulfa antibiotics  Home Medications   Prior to Admission medications   Medication Sig Start Date End Date Taking? Authorizing Provider  abatacept (ORENCIA) 250 MG injection Inject into the vein every 30 (thirty) days.     Historical Provider, MD  acetaminophen (TYLENOL) 500 MG tablet Take 500 mg by mouth as needed for mild pain.     Historical Provider, MD  amLODipine (NORVASC) 10 MG tablet Take 10 mg by mouth daily.    Historical Provider, MD  aspirin 81 MG tablet Take 81 mg by mouth every 3 (three) days.     Historical Provider, MD  atenolol (TENORMIN) 50 MG tablet  TK 1 T OP D FOR TREMOR 09/29/15   Historical Provider, MD  atorvastatin (LIPITOR) 40 MG tablet Take 1 tablet (40 mg total) by mouth daily at 6 PM. 02/05/15   Rhetta Mura, MD  conjugated estrogens (PREMARIN) vaginal cream USE VAGINALLY 3 TIMES WEEKLY 04/06/15   Ria Comment, FNP  cycloSPORINE (RESTASIS) 0.05 % ophthalmic emulsion Place 1 drop into both eyes 2 (two) times daily.    Historical Provider, MD  divalproex (DEPAKOTE) 250 MG DR tablet Take 1 tablet (250 mg total) by mouth every 12 (twelve) hours. 02/05/15   Rhetta Mura, MD  folic acid (FOLVITE) 1 MG tablet Take 1 mg by mouth daily.    Historical Provider, MD  IRON PO Take 1 tablet by mouth daily.    Historical Provider, MD  leflunomide (ARAVA) 20 MG tablet Take 20 mg by mouth every other day.     Historical Provider, MD  levothyroxine (SYNTHROID, LEVOTHROID) 75 MCG tablet Take 75 mcg by mouth daily before breakfast.    Historical Provider, MD  metroNIDAZOLE (METROGEL) 0.75 % vaginal gel Place one applicator at night for 5 days. Patient taking differently: Paulo Fruit Fri 02/23/15   Ria Comment, FNP  Multiple Vitamins-Minerals (OCUVITE PRESERVISION PO) Take by mouth.    Historical Provider, MD  POLY-IRON 150 150 MG capsule  10/13/15   Historical Provider, MD  Polyethyl Glycol-Propyl Glycol (SYSTANE OP) Place 1 drop into both eyes 4 (four) times daily.    Historical Provider, MD  Polyethyl Glycol-Propyl Glycol (SYSTANE) 0.4-0.3 % SOLN Place 1 drop into both eyes daily.    Historical Provider, MD  potassium chloride SA (K-DUR,KLOR-CON) 20 MEQ tablet Take 20 mEq by mouth 2 (two) times daily.    Historical Provider, MD  PREMARIN vaginal cream APPLY VAGINALLY 3 TIMES A WEEK 10/25/15   Ria Comment, FNP  valsartan-hydrochlorothiazide (DIOVAN-HCT) 160-25 MG per tablet Take 1 tablet by mouth daily.    Historical Provider, MD  Vitamin D, Ergocalciferol, (DRISDOL) 50000 UNITS CAPS Take 50,000 Units by mouth every 14 (fourteen) days.    Historical Provider, MD   BP 132/72 mmHg  Pulse 70  Temp(Src) 98 F (36.7 C) (Oral)  Resp 26  Ht 4\' 9"  (1.448 m)  Wt 100 lb (45.36 kg)  BMI 21.63 kg/m2  SpO2 99%  LMP 08/14/1981 Physical Exam Physical Exam  Nursing note and vitals reviewed. Constitutional: Well developed, well nourished, non-toxic, and in no acute distress Head: Normocephalic and atraumatic.  Mouth/Throat: Oropharynx is clear and moist.  Neck: Normal range of motion. Neck supple.  Cardiovascular: Normal rate and regular rhythm.  +1 pitting edema bilaterally Pulmonary/Chest: Effort normal and breath sounds normal.  Abdominal: Soft. There is no tenderness. There is no rebound and no guarding.  Musculoskeletal: Normal range of motion.  Neurological: Alert, no facial droop, fluent speech,  moves all extremities symmetrically Skin: Skin is warm and dry.  Psychiatric: Cooperative  ED Course  Procedures (including critical care time) Labs Review Labs Reviewed  BASIC METABOLIC PANEL - Abnormal; Notable for the following:    Calcium 8.3 (*)    All other components within normal limits  CBC - Abnormal; Notable for the following:    RBC 3.31 (*)    Hemoglobin 10.9 (*)    HCT 32.4 (*)    RDW 15.7 (*)    All other components within normal limits  BRAIN NATRIURETIC PEPTIDE - Abnormal; Notable for the following:    B Natriuretic Peptide 109.9 (*)    All other  components within normal limits  TROPONIN I  PROTIME-INR    Imaging Review Dg Chest 2 View  12/18/2015  CLINICAL DATA:  Patient with congestion leg swelling fatigue diarrhea shortness of breath and productive cough. EXAM: CHEST  2 VIEW COMPARISON:  Chest radiograph 06/14/2015 FINDINGS: Monitoring leads overlie the patient. Stable cardiac and mediastinal contours with tortuosity of the thoracic aorta. No consolidative pulmonary opacities. No pleural effusion or pneumothorax. Thoracic spine degenerative changes. IMPRESSION: No acute cardiopulmonary process. Electronically Signed   By: Annia Belt M.D.   On: 12/18/2015 13:52   I have personally reviewed and evaluated these images and lab results as part of my medical decision-making.   EKG Interpretation   Date/Time:  Saturday Dec 18 2015 13:07:47 EDT Ventricular Rate:  66 PR Interval:  159 QRS Duration: 121 QT Interval:  453 QTC Calculation: 475 R Axis:   -51 Text Interpretation:  Sinus rhythm Right bundle branch block LOW VOLTAGE  No significant change since last tracing Confirmed by Lynleigh Kovack MD, Annabelle Harman ([id number])  on 12/18/2015 1:12:52 PM      MDM   Final diagnoses:  Bilateral edema of lower extremity    82 year old female with history of HTN, CVA, and hypothyroidism who presents with lower extremity edema and cough. Her vital signs are stable on arrival. She is  nontoxic and in no acute distress. Does have pitting edema in bilateral lower extremities, the lungs are clear. EKG shows a known right bundle branch block, but no acute changes since prior EKG. Chest x-ray shows no acute cardiopulmonary processes. She has a normal troponin, and a BNP of 100, which is not suggestive of significant CHF exacerbation. Presentation does not seem consistent with that of ACS either. She has stable anemia, and no other major metabolic or electrolyte derangements. She is on room air breathing comfortably with normal oxygenation. At this time she will start supportive care over the weekend with leg elevation and compression stockings. She will follow-up with her primary care doctor in 1-2 days for follow-up to discuss further need for potential diuresis. Strict return and follow-up instructions reviewed with patient and daughter. They expressed understanding of all discharge instructions and felt comfortable with the plan of care.     Lavera Guise, MD 12/18/15 [phone]

## 2015-12-27 DIAGNOSIS — M0589 Other rheumatoid arthritis with rheumatoid factor of multiple sites: Secondary | ICD-10-CM | POA: Diagnosis not present

## 2015-12-30 ENCOUNTER — Telehealth: Payer: Self-pay | Admitting: Neurology

## 2015-12-30 NOTE — Telephone Encounter (Signed)
Pt's daughter called said pt is complaining with blurry vision. Pt has seen opthamologist but she is given drops for eyes only. Said she doesn't know if that is all that can be done. Sts sometimes it takes awhile for pt to understand what is being said to her and then other times it is not an issue. Please call

## 2015-12-30 NOTE — Telephone Encounter (Signed)
Anna Mccoy

## 2015-12-30 NOTE — Telephone Encounter (Signed)
Called daughter back. Made f/u with Wynelle Cleveland, NP tomorrow at 11am, check in 1045am. She stated her mother states her vision changes started about a week ago. She has a hard time focusing her vision throughout the day. [name] states either herself or other daughter, Talbert Forest, will be coming with pt to appt tomorrow. I advised I will let Eber Terral Cooks know. She verbalized understanding.

## 2015-12-30 NOTE — Telephone Encounter (Signed)
Anna Mccoy, have them follow up with Anna Mccoy or Anna Mccoy please thanks.

## 2015-12-31 ENCOUNTER — Encounter: Payer: Self-pay | Admitting: Nurse Practitioner

## 2015-12-31 ENCOUNTER — Ambulatory Visit (INDEPENDENT_AMBULATORY_CARE_PROVIDER_SITE_OTHER): Payer: Medicare Other | Admitting: Nurse Practitioner

## 2015-12-31 VITALS — BP 116/73 | HR 79 | Ht 59.0 in | Wt 98.4 lb

## 2015-12-31 DIAGNOSIS — I10 Essential (primary) hypertension: Secondary | ICD-10-CM | POA: Diagnosis not present

## 2015-12-31 DIAGNOSIS — H538 Other visual disturbances: Secondary | ICD-10-CM | POA: Diagnosis not present

## 2015-12-31 DIAGNOSIS — H04123 Dry eye syndrome of bilateral lacrimal glands: Secondary | ICD-10-CM | POA: Diagnosis not present

## 2015-12-31 DIAGNOSIS — I639 Cerebral infarction, unspecified: Secondary | ICD-10-CM | POA: Diagnosis not present

## 2015-12-31 NOTE — Patient Instructions (Addendum)
Will check antibodies for sjogren syndrome  Continue aspirin for secondary stroke prevention  blood pressure 116/73 in excellent control  Neuro exam is unchanged  Follow-up is planned

## 2015-12-31 NOTE — Progress Notes (Addendum)
[facility]  PATIENT: Anna Mccoy DOB: 17-Apr-1925   REASON FOR VISIT follow-up for lacunar infarct, blurry vision and dry skin HISTORY FROM: Patient and daughter   HISTORY OF PRESENT ILLNESS: Dr Lucia Gaskins 02/11/2015 : Anna Mccoy is a 82 y.o. female here as a referral from Dr. Andrey Campanile for follow up of stroke. She has a PMHx of LBP, lumbar spinal stenosis s/p surgery 2010, right foot numbness, HTN, hypothyroidism, RA who presented to ED on 01/22/2015 complaining of left sided temporal and maxillary stabbing and lancinating pain. She was on Plavix daily and aspirin 81 mg every 3 days. Neurologic exam nonfocal. MRI of the brain demonstrated areas of acute infarction in the left posterior frontal and parietal cortex areas. Also showed small left occipital subdural hemorrhage. She was admitted to [facility] for stroke workup. The headache was acute in onset and be going on for about a week. Symptoms were described as stabbing and intermittent without photophobia, phonophobia, nausea, vomiting, visual changes, or autonomic features.  She is here with her 2 daughters who provide most of the information. She feels she is doing well. Not back to her baseline but doing better. She is taking care of herself, daughter taking good care of her and she is at peace. She has no had a headache since leaving the hospital. She sometimes has some difficulty explaining things, she gets off track but no confusional spells. She may ask the same questions twice such as about medicine but patient says she just wants to make sure she takes it correctly. She lives wither two daughters and her sister that is 28. No more seizures or seizure like episodes. No aphasia. Her vision is not as clear as it was prior to the stroke. She had cataract removal in May and improved but worsened after the stroke. She notes that this morning she was able to read better and she was able to read fine print and she is  very happy about that. She had PT yesterday and she is doing very well, and she is going to start occupational Therapy. She is taking the Depakote and is not having any side effects. She is taking aspirin every 3 days (should be taking every day). She is still on the Plavix.  Per daughters: They got home this past Friday and she is having coordination issues, reaching out for objects and thinks she is grabbing them but isn't. Her vision is blurry since she has been home. Vision is worsening, she can't recognize people in pictures on the mantle because she can't see them clearly anymore. She is more forgetful since being home. Patient is asking the same questions. She is getting more things mixed up but not altered in any way. Daughters are worried she has had a repeat stroke, the symptoms started happening after the last MRI of the brain.  Reviewed notes, labs and imaging from outside physicians, which showed: She presented to the ED on 01/22/2015 complaining of left sided temporal and maxillary stabbing and lancinating pain. She was on Plavix daily and aspirin 81 mg every 3 days. Neurologic exam nonfocal. MRI of the brain demonstrated areas of acute infarction in the left posterior frontal and parietal cortex areas. Also showed left occipital subdural hemorrhage unclear if it was secondary to a fall 1 week prior versus meningeal thickening secondary to rheumatoid arthritis. The left posterior frontal and parietal multiple small infarcts were thought to be secondary to small vessel disease versus rheumatoid vasculitis(personally reviewed images).  MRA was unremarkable. She was admitted to [facility] for stroke workup. While inpatient, patient had a seizure with right leg and hand shaking and expressive aphasia. She was started on Depakote with good results for both the seizures and the headache. LDL was 120, statin was startedhgba1c 5.5 Cbc with mild anemiaBmp with slightlky elevated gfr EEG: This is an  abnormal electroencephalogram secondary to left temporal slowing and rare left temporal sharp transients. This is consistent with the patient's history of left hemispheric acute infarcts seen on MRI.  Update 05/12/2015 PS: She returns for follow-up after last visit 3 months ago with Dr. Daisy Blossom. She is doing well without recurrent seizures or strokelike episodes. She is tolerating Depakote well without significant side effects except possible minor and tremors which are not functionally disabling. She remains on aspirin 81 mg is tolerating well with out significant bleeding or bruising. Her blood pressure is usually well controlled though it is slightly elevated at 154/67 in office today. She had follow-up MRI scan of the brain done on 03/04/15 which I personally reviewed shows changes of small vessel disease and mild atrophy. The previously noted small subcutaneous is not as well appreciated. Patient is overall doing well and has had no new complaints. Update 11/11/2015 PS: She returns for follow-up after last visit 6 months ago. She continues to do well without significant recurrent strokes or TIAs. She is tolerating aspirin well without bleeding or bruising. She states her blood pressure is well controlled and today it is 132/65. She is tolerating Lipitor well without myalgias or arthralgias. She is complaining of swelling in her feet and she doesn't know the reason. She has an upcoming visit with her primary physician and advise her to discuss possibly stopping the Norvasc which may be contributing. UPDATE 05/19/2017[name], 82 year old female returns for follow-up. She called to the office complaining of bilateral blurred vision and her daughter wanted her to be seen . On further questioning In the office today and according to Dr. Darreld Mclean note from June 2016 her vision was blurry at that time and has been since her cataract surgery in May 2016. She has documented dry eyes and is on 2 different drops  for that. She also complains of dry mouth and dry skin. She has not had further stroke or TIA symptoms she has  no focal weakness. She has had no seizure activity and she remains on Depakote without side effects. She remains on aspirin for secondary stroke prevention . Blood pressure is well controlled in the office today at 116/73. She returns for reevaluation   REVIEW OF SYSTEMS: Full 14 system review of systems performed and notable only for those listed, all others are neg:  Constitutional: neg  Cardiovascular: neg Ear/Nose/Throat: neg  Skin: neg Eyes: neg Respiratory: neg Gastroitestinal: neg  Hematology/Lymphatic: neg  Endocrine: neg Musculoskeletal:neg Allergy/Immunology: neg Neurological: neg Psychiatric: neg Sleep : neg   ALLERGIES: Allergies  Allergen Reactions  . Ciprofloxacin Diarrhea  . Lidocaine   . Neosporin [Neomycin-Bacitracin Zn-Polymyx]   . Sulfa Antibiotics     HOME MEDICATIONS: Outpatient Prescriptions Prior to Visit  Medication Sig Dispense Refill  . abatacept (ORENCIA) 250 MG injection Inject into the vein every 30 (thirty) days.     Marland Kitchen acetaminophen (TYLENOL) 500 MG tablet Take 500 mg by mouth as needed for mild pain.     Marland Kitchen amLODipine (NORVASC) 10 MG tablet Take 10 mg by mouth daily.    Marland Kitchen aspirin 81 MG tablet Take 81 mg by  mouth daily.     Marland Kitchen atorvastatin (LIPITOR) 40 MG tablet Take 1 tablet (40 mg total) by mouth daily at 6 PM. 30 tablet 0  . cycloSPORINE (RESTASIS) 0.05 % ophthalmic emulsion Place 1 drop into both eyes 3 (three) times daily.     . divalproex (DEPAKOTE) 250 MG DR tablet Take 1 tablet (250 mg total) by mouth every 12 (twelve) hours. 60 tablet 0  . folic acid (FOLVITE) 1 MG tablet Take 1 mg by mouth daily.    . IRON PO Take 1 tablet by mouth daily.    Marland Kitchen leflunomide (ARAVA) 20 MG tablet Take 20 mg by mouth every other day.    . levothyroxine (SYNTHROID, LEVOTHROID) 75 MCG tablet Take 75 mcg by mouth daily before breakfast.    .  metroNIDAZOLE (METROGEL) 0.75 % vaginal gel Place one applicator at night for 5 days. (Patient taking differently: Mon, wed Fri) 70 g 0  . Multiple Vitamins-Minerals (OCUVITE PRESERVISION PO) Take by mouth.    Marland Kitchen POLY-IRON 150 150 MG capsule   0  . Polyethyl Glycol-Propyl Glycol (SYSTANE) 0.4-0.3 % SOLN Place 1 drop into both eyes daily.    . potassium chloride SA (K-DUR,KLOR-CON) 20 MEQ tablet Take 20 mEq by mouth 2 (two) times daily.    Marland Kitchen PREMARIN vaginal cream APPLY VAGINALLY 3 TIMES A WEEK 30 g 3  . valsartan-hydrochlorothiazide (DIOVAN-HCT) 160-25 MG per tablet Take 1 tablet by mouth daily.    . Vitamin D, Ergocalciferol, (DRISDOL) 50000 UNITS CAPS Take 50,000 Units by mouth every 14 (fourteen) days.    Marland Kitchen atenolol (TENORMIN) 50 MG tablet TK 1 T OP D FOR TREMOR  0  . conjugated estrogens (PREMARIN) vaginal cream USE VAGINALLY 3 TIMES WEEKLY 90 g 3  . Polyethyl Glycol-Propyl Glycol (SYSTANE OP) Place 1 drop into both eyes 4 (four) times daily.     No facility-administered medications prior to visit.    PAST MEDICAL HISTORY: Past Medical History  Diagnosis Date  . Abnormal EKG   . Hypertension   . Thyroid disease     HYPOTHYROIDISM  . Rheumatoid arthritis(714.0)   . Hx-TIA (transient ischemic attack)   . Chest pain     H/O  . Fatigue   . RBBB   . Aortic valve sclerosis   . Mitral valve regurgitation     TRIVIAL  . Tricuspid valve regurgitation     MILD  . Procidentia of uterus     uses pessary  . Menopausal state age 13  . Vitamin D deficiency disease   . DDD (degenerative disc disease), lumbar   . Post-menopausal bleeding 02/2011    Endo Biopsy 7/12 benign, no hyperplasia  . Arthritis   . Stroke [facility])     PAST SURGICAL HISTORY: Past Surgical History  Procedure Laterality Date  . Cholecystectomy, laparoscopic  2002  . Laminectomy and microdiscectomy lumbar spine  01/23/09  . Colonoscopy w/ biopsies  6/09    sigmoid diverticuli recheck 5 years  . Vaginal delivery       x5  . Eye surgery  04/24/14    Cataract sx in right eye with lens replacement.    FAMILY HISTORY: Family History  Problem Relation Age of Onset  . Hypertension Mother   . Stroke Mother   . Hypertension Sister   . Diabetes Sister   . Hypertension Sister   . Diabetes Sister     SOCIAL HISTORY: Social History   Social History  . Marital Status: Widowed  Spouse Name: N/A  . Number of Children: 5  . Years of Education: 12   Occupational History  . Retired     Social History Main Topics  . Smoking status: Never Smoker   . Smokeless tobacco: Never Used  . Alcohol Use: No  . Drug Use: No  . Sexual Activity: No     Comment: Widowed 01/10/07 age 71   Other Topics Concern  . Not on file   Social History Narrative   Patient is right handed, resides with daughter and sister.    Caffeine use: Coffee (drinks 1/2-1 cup per day)     PHYSICAL EXAM  Filed Vitals:   12/31/15 1051  BP: 116/73  Pulse: 79  Height: 4\' 11"  (1.499 m)  Weight: 98 lb 6.4 oz (44.634 kg)   Body mass index is 19.86 kg/(m^2).  Generalized: Well developed, Frail appearing female no acute distress  Head: normocephalic and atraumatic,. Oropharynx benign  Neck: Supple, no carotid bruits  Cardiac: Regular rate rhythm, no murmur  Musculoskeletal: Significant kyphosis Skin pedal edema  Neurological examination   Mentation: Alert oriented to time, place, history taking. Attention span and concentration appropriate. Recent and remote memory intact.  Follows all commands speech and language fluent.   Cranial nerve II-XII: Fundoscopic exam reveals flat  disc margins.Pupils were equal round reactive to light extraocular movements were full, visual field were full on confrontational test. Facial sensation and strength were normal. hearing was intact to finger rubbing bilaterally. Uvula tongue midline. head turning and shoulder shrug were normal and symmetric.Tongue protrusion into cheek strength was  normal. Motor: normal bulk and tone, full strength in the BUE, BLE, fine finger movements normal, no pronator drift. No focal weakness Sensory: normal and symmetric to light touch, pinprick, and  Vibration,  Coordination: finger-nose-finger, heel-to-shin bilaterally, no dysmetria Reflexes:, Symmetric in upper and lower plantar responses were flexor bilaterally. Gait and Station: Rising up from seated position without assistance, stooped stance  uses a single-point cane, no ataxia  DIAGNOSTIC DATA (LABS, IMAGING, TESTING) - I reviewed patient records, labs, notes, testing and imaging myself where available.  Lab Results  Component Value Date   WBC 8.1 12/18/2015   HGB 10.9* 12/18/2015   HCT 32.4* 12/18/2015   MCV 97.9 12/18/2015   PLT 228 12/18/2015      Component Value Date/Time   NA 137 12/18/2015 1345   K 3.8 12/18/2015 1345   CL 106 12/18/2015 1345   CO2 26 12/18/2015 1345   GLUCOSE 79 12/18/2015 1345   BUN 11 12/18/2015 1345   CREATININE 0.80 12/18/2015 1345   CALCIUM 8.3* 12/18/2015 1345   PROT 7.2 12/12/2013 1116   PROT 6.6 01/15/2009 0927   ALBUMIN 3.7 01/15/2009 0927   AST 16 01/15/2009 0927   ALT 12 01/15/2009 0927   ALKPHOS 53 01/15/2009 0927   BILITOT 0.7 01/15/2009 0927   GFRNONAA >60 12/18/2015 1345   GFRAA >60 12/18/2015 1345    Lab Results  Component Value Date   HGBA1C 5.5 02/03/2015   Lab Results  Component Value Date   VITAMINB12 596 10/22/2014   Lab Results  Component Value Date   TSH 0.328* 10/22/2014      ASSESSMENT AND PLAN Anna Mccoy is a 82 y.o. female with history of hypertension, hypothyroidism and small left occipital subdural hemorrhage, left posterior frontal and parietal multiple small vessel disease lacunar infarcts. Had episode of right-sided motor seizure and aphasia. She returns today for blurred vision however on questioning  has been going on for over a year in addition she complains with dry skin and dry mouth  and dry eyes.  PLAN: Will check antibodies for sjogren syndrome  Continue aspirin for secondary stroke prevention  blood pressure 116/73 in excellent control  Neuro exam is unchanged  Continue Depakote for seizure disorder Follow-up is planned Additional 10 minutes spent answering questions for daughterVst time 25 min Anna Mccoy, [address] [facility], [facility], APRN  [facility] [address], [address] [place], [place] [address] [phone]   Personally participated in and made any corrections needed to history, physical, neuro exam,assessment and plan as stated above, evaluated lab date, reviewed imaging studies and agree with radiology interpretations.    Naomie Dean, MD [facility]

## 2016-01-01 LAB — SJOGREN'S SYNDROME ANTIBODS(SSA + SSB): ENA SSA (RO) AB: 0.2 AI (ref 0.0–0.9)

## 2016-01-04 ENCOUNTER — Telehealth: Payer: Self-pay | Admitting: *Deleted

## 2016-01-04 NOTE — Telephone Encounter (Signed)
Relayed normal lab results to the pt.  She verbalized understanding.

## 2016-01-04 NOTE — Telephone Encounter (Signed)
-----   Message from Butch Penny, NP sent at 01/04/2016 11:56 AM EDT ----- Lab work is normal. Please call the patient. Thanks.

## 2016-01-04 NOTE — Telephone Encounter (Signed)
Routing to Patricia Grubb, FNP for review and advise. 

## 2016-01-11 ENCOUNTER — Ambulatory Visit: Payer: Medicare Other | Admitting: Nurse Practitioner

## 2016-01-11 ENCOUNTER — Ambulatory Visit (INDEPENDENT_AMBULATORY_CARE_PROVIDER_SITE_OTHER): Payer: Medicare Other | Admitting: Nurse Practitioner

## 2016-01-11 ENCOUNTER — Encounter: Payer: Self-pay | Admitting: Nurse Practitioner

## 2016-01-11 VITALS — BP 136/80 | HR 92 | Resp 16 | Ht 59.0 in | Wt 98.0 lb

## 2016-01-11 DIAGNOSIS — N813 Complete uterovaginal prolapse: Secondary | ICD-10-CM

## 2016-01-11 DIAGNOSIS — R197 Diarrhea, unspecified: Secondary | ICD-10-CM

## 2016-01-11 DIAGNOSIS — Z4689 Encounter for fitting and adjustment of other specified devices: Secondary | ICD-10-CM | POA: Diagnosis not present

## 2016-01-11 DIAGNOSIS — I639 Cerebral infarction, unspecified: Secondary | ICD-10-CM

## 2016-01-11 NOTE — Progress Notes (Signed)
Subjective:   82 y.o. Widowed Black female [id number] here for pessary check.  Patient has been using following pessary style and size:  2 3/4" ring with support.  She describes the following issues with the pessary:  none.  Use of protective clothing such as Depends or pads Yes.  . Problems with protective clothing with rash No..  Constipation issues with use of pessary No.. Still having diarrhea that is more flatus and loose.  She has limited diet so as not get diarrhea.  Weight loss still there She is not sexually active.     ROS:   no breast pain or new or enlarging lumps on self exam,  no abnormal bleeding, pelvic pain or discharge,   no dysuria, trouble voiding or hematuria. Compliant to use of vaginal cream Yes.   General Exam:    BP 136/80 mmHg  Pulse 92  Resp 16  Ht 4\' 11"  (1.499 m)  Wt 98 lb (44.453 kg)  BMI 19.78 kg/m2  LMP 08/14/1981  General appearance: alert, cooperative and appears stated age.  She is a lot slower, uses cain for ambulation and not as talkative.  Seems very frail this time.   Pelvic: External genitalia:  no lesions   Before pessary was removed No. prolapse over the pessary   In correct position Yes.                Urethra: normal appearing urethra with no masses, tenderness or lesions              Vagina: normal appearing vagina with normal color and discharge, no lesions.  There are No abrasions or ulcerations.               Cervix: normal appearance Cervical lesions were not found   Bimanual Exam:  Uterus:  normal size, contour, position, consistency, mobility, non-tender"uterus is normal size, shape, consistency and non tender"}                               Adnexa:    not indicated                             Pessary was removed without difficulty without using forceps.  Pessary was cleansed with Betadine.  Pessary was replaced. Patient tolerated procedure well.    Assessment :  SUI, Cystocele- symptomatic, Rectocele- asymptomatic        Continued  diarrhea   Use of pessary continued   Failing health   Plan:    Return to office in 2 months for recheck.         continue with vaginal cream   Will get referral back to Dr. 10/12/1981  An After Visit Summary was printed and given to the patient.

## 2016-01-12 ENCOUNTER — Telehealth: Payer: Self-pay | Admitting: Nurse Practitioner

## 2016-01-12 NOTE — Progress Notes (Signed)
Reviewed personally.  M. Suzanne Audia Amick, MD.  

## 2016-01-12 NOTE — Telephone Encounter (Signed)
Patient's mom calling to talk with the nurse. It is in reference to a referral.

## 2016-01-13 ENCOUNTER — Ambulatory Visit (INDEPENDENT_AMBULATORY_CARE_PROVIDER_SITE_OTHER): Payer: Medicare Other | Admitting: Cardiology

## 2016-01-13 ENCOUNTER — Telehealth: Payer: Self-pay | Admitting: Cardiology

## 2016-01-13 ENCOUNTER — Encounter: Payer: Self-pay | Admitting: Cardiology

## 2016-01-13 VITALS — BP 150/80 | HR 74 | Ht 59.0 in | Wt 99.0 lb

## 2016-01-13 DIAGNOSIS — R600 Localized edema: Secondary | ICD-10-CM | POA: Insufficient documentation

## 2016-01-13 DIAGNOSIS — I10 Essential (primary) hypertension: Secondary | ICD-10-CM | POA: Diagnosis not present

## 2016-01-13 DIAGNOSIS — I5033 Acute on chronic diastolic (congestive) heart failure: Secondary | ICD-10-CM | POA: Diagnosis not present

## 2016-01-13 DIAGNOSIS — I639 Cerebral infarction, unspecified: Secondary | ICD-10-CM

## 2016-01-13 DIAGNOSIS — I5032 Chronic diastolic (congestive) heart failure: Secondary | ICD-10-CM | POA: Insufficient documentation

## 2016-01-13 DIAGNOSIS — I110 Hypertensive heart disease with heart failure: Secondary | ICD-10-CM | POA: Diagnosis not present

## 2016-01-13 HISTORY — DX: Hypertensive heart disease with heart failure: I11.0

## 2016-01-13 MED ORDER — AMLODIPINE BESYLATE 5 MG PO TABS: 2.5000 mg | ORAL_TABLET | Freq: Every day | ORAL | Status: DC

## 2016-01-13 MED ORDER — AMLODIPINE BESYLATE 5 MG PO TABS: 5.0000 mg | ORAL_TABLET | Freq: Every day | ORAL | Status: DC

## 2016-01-13 MED ORDER — VALSARTAN 320 MG PO TABS: 320.0000 mg | ORAL_TABLET | Freq: Every day | ORAL | Status: DC

## 2016-01-13 MED ORDER — FUROSEMIDE 40 MG PO TABS: 40.0000 mg | ORAL_TABLET | Freq: Every day | ORAL | Status: DC

## 2016-01-13 NOTE — Telephone Encounter (Signed)
Call to daughter [name]. She was calling to see if our office could help with initiation of referral to cardiology. However, referral was processed at her PCP and patient is at cardiology now for evaluation of LE edema.  She is advised to call back with any concerns.  Routing to provider for final review. Patient agreeable to disposition. Will close encounter.

## 2016-01-13 NOTE — Progress Notes (Signed)
Cardiology Office Note    Date:  01/13/2016   ID:  [name], DOB 1925-07-20, MRN [id number]  PCP and Referring Physician:  Pamelia Hoit, MD  Cardiologist:   Tobias Alexander, MD   Chief complain: Lower extremity edema  History of Present Illness:  Anna Mccoy is a 82 y.o. female with prior medical history of hypertension, stroke in June 2016 who has noticed worsening shortness of breath and lower extremity edema for the last couple of months. The patient is still active walks around with a cane and has noticed to get short of breath just walking around her house. Year ago when she had her stroke she had an echocardiogram that showed normal LV function with no regional wall motion abnormalities, her diastolic function and wall thickness was not described. She denies any chest pain palpitations or syncope. She has had long-term problems with high blood pressure difficult to manage medically. She denies any orthopnea or paroxysmal nocturnal dyspnea. She has never smoked.  Past Medical History  Diagnosis Date  . Abnormal EKG   . Hypertension   . Thyroid disease     HYPOTHYROIDISM  . Rheumatoid arthritis(714.0)   . Hx-TIA (transient ischemic attack)   . Chest pain     H/O  . Fatigue   . RBBB   . Aortic valve sclerosis   . Mitral valve regurgitation     TRIVIAL  . Tricuspid valve regurgitation     MILD  . Procidentia of uterus     uses pessary  . Menopausal state age 18  . Vitamin D deficiency disease   . DDD (degenerative disc disease), lumbar   . Post-menopausal bleeding 02/2011    Endo Biopsy 7/12 benign, no hyperplasia  . Arthritis   . Stroke [facility])     Past Surgical History  Procedure Laterality Date  . Cholecystectomy, laparoscopic  2002  . Laminectomy and microdiscectomy lumbar spine  01/23/09  . Colonoscopy w/ biopsies  6/09    sigmoid diverticuli recheck 5 years  . Vaginal delivery      x5  . Eye surgery  04/24/14    Cataract sx in right eye with  lens replacement.    Current Medications: Outpatient Prescriptions Prior to Visit  Medication Sig Dispense Refill  . abatacept (ORENCIA) 250 MG injection Inject into the vein every 30 (thirty) days.     Marland Kitchen acetaminophen (TYLENOL) 500 MG tablet Take 500 mg by mouth as needed for mild pain.     Marland Kitchen aspirin 81 MG tablet Take 81 mg by mouth daily.     Marland Kitchen atorvastatin (LIPITOR) 40 MG tablet Take 1 tablet (40 mg total) by mouth daily at 6 PM. 30 tablet 0  . cycloSPORINE (RESTASIS) 0.05 % ophthalmic emulsion Place 1 drop into both eyes 3 (three) times daily.     . divalproex (DEPAKOTE) 250 MG DR tablet Take 1 tablet (250 mg total) by mouth every 12 (twelve) hours. 60 tablet 0  . folic acid (FOLVITE) 1 MG tablet Take 1 mg by mouth daily.    . IRON PO Take 1 tablet by mouth daily.    Marland Kitchen leflunomide (ARAVA) 20 MG tablet Take 20 mg by mouth every other day.    . levothyroxine (SYNTHROID, LEVOTHROID) 75 MCG tablet Take 75 mcg by mouth daily before breakfast.    . metroNIDAZOLE (METROGEL) 0.75 % vaginal gel Place one applicator at night for 5 days. (Patient taking differently: Mon, wed Fri) 70 g 0  . Multiple  Vitamins-Minerals (OCUVITE PRESERVISION PO) Take by mouth.    Marland Kitchen POLY-IRON 150 150 MG capsule   0  . Polyethyl Glycol-Propyl Glycol (SYSTANE) 0.4-0.3 % SOLN Place 1 drop into both eyes daily.    . potassium chloride SA (K-DUR,KLOR-CON) 20 MEQ tablet Take 20 mEq by mouth 2 (two) times daily.    Marland Kitchen PREMARIN vaginal cream APPLY VAGINALLY 3 TIMES A WEEK 30 g 3  . Vitamin D, Ergocalciferol, (DRISDOL) 50000 UNITS CAPS Take 50,000 Units by mouth every 14 (fourteen) days.    Marland Kitchen amLODipine (NORVASC) 10 MG tablet Take 10 mg by mouth daily.    . valsartan-hydrochlorothiazide (DIOVAN-HCT) 160-25 MG per tablet Take 1 tablet by mouth daily.     No facility-administered medications prior to visit.     Allergies:   Ciprofloxacin; Lidocaine; Neosporin; and Sulfa antibiotics   Social History   Social History  .  Marital Status: Widowed    Spouse Name: N/A  . Number of Children: 5  . Years of Education: 12   Occupational History  . Retired     Social History Main Topics  . Smoking status: Never Smoker   . Smokeless tobacco: Never Used  . Alcohol Use: No  . Drug Use: No  . Sexual Activity: No     Comment: Widowed 01/10/07 age 24   Other Topics Concern  . None   Social History Narrative   Patient is right handed, resides with daughter and sister.    Caffeine use: Coffee (drinks 1/2-1 cup per day)     Family History:  The patient's family history includes Diabetes in her sister and sister; Hypertension in her mother, sister, and sister; Stroke in her mother.   ROS:   Please see the history of present illness.    ROS All other systems reviewed and are negative.   PHYSICAL EXAM:   VS:  BP 150/80 mmHg  Pulse 74  Ht 4\' 11"  (1.499 m)  Wt 99 lb (44.906 kg)  BMI 19.98 kg/m2  LMP 08/14/1981   GEN: Well nourished, well developed, in no acute distress HEENT: normal Neck: no JVD, carotid bruits, or masses Cardiac: RRR; no murmurs, rubs, or gallops, bilateral 2+ pitting edema to her knees.  Respiratory:  clear to auscultation bilaterally, normal work of breathing GI: soft, nontender, nondistended, + BS MS: no deformity or atrophy Skin: warm and dry, no rash Neuro:  Alert and Oriented x 3, Strength and sensation are intact Psych: euthymic mood, full affect  Wt Readings from Last 3 Encounters:  01/13/16 99 lb (44.906 kg)  01/11/16 98 lb (44.453 kg)  12/31/15 98 lb 6.4 oz (44.634 kg)      Studies/Labs Reviewed:   EKG:  EKG from 12/20/15 showed SR, RBBB Recent Labs: 12/18/2015: B Natriuretic Peptide 109.9*; BUN 11; Creatinine, Ser 0.80; Hemoglobin 10.9*; Platelets 228; Potassium 3.8; Sodium 137   Lipid Panel    Component Value Date/Time   CHOL 197 02/03/2015 0705   TRIG 61 02/03/2015 0705   HDL 65 02/03/2015 0705   CHOLHDL 3.0 02/03/2015 0705   VLDL 12 02/03/2015 0705   LDLCALC  120* 02/03/2015 0705    Additional studies/ records that were reviewed today include:   TTE: 01/2015 ------------------------------------------------------------------- Left ventricle: The cavity size was normal. Systolic function was normal. The estimated ejection fraction was in the range of 55% to 60%. Wall motion was normal; there were no regional wall motion abnormalities.  ------------------------------------------------------------------- Aortic valve: Trileaflet; normal thickness leaflets. Mobility was not restricted. Sclerosis  without stenosis. Doppler: Transvalvular velocity was within the normal range. There was no stenosis. There was no regurgitation.  ------------------------------------------------------------------- Aorta: Aortic root: The aortic root was normal in size.  ------------------------------------------------------------------- Mitral valve: Structurally normal valve. Mobility was not restricted. Doppler: Transvalvular velocity was within the normal range. There was no evidence for stenosis. There was no regurgitation.  ------------------------------------------------------------------- Left atrium: The atrium was normal in size.  ------------------------------------------------------------------- Atrial septum: No defect or patent foramen ovale was identified.  ------------------------------------------------------------------- Right ventricle: The cavity size was normal. Wall thickness was normal. Systolic function was normal.  ------------------------------------------------------------------- Pulmonic valve: Doppler: Transvalvular velocity was within the normal range. There was no evidence for stenosis.  ------------------------------------------------------------------- Tricuspid valve: Structurally normal valve. Doppler: Transvalvular velocity was within the normal range. There was trivial  regurgitation.  ------------------------------------------------------------------- Pulmonary artery: The main pulmonary artery was normal-sized. Systolic pressure was within the normal range.  ------------------------------------------------------------------- Right atrium: The atrium was normal in size.  ------------------------------------------------------------------- Pericardium: The pericardium was normal in appearance. There was no pericardial effusion.  ------------------------------------------------------------------- Systemic veins: Inferior vena cava: The vessel was normal in size.     ASSESSMENT:    1. Bilateral edema of lower extremity   2. Essential hypertension      PLAN:  In order of problems listed above:  1. The patient appears to have symptoms of diastolic heart failure, we will reorder an echocardiogram to evaluate for degree of LVH systolic dysfunction. 2. Her lower extremity edema is probably multifactorial secondary to diastolic heart failure, vasodilators use for her high blood pressure and sitting position during today. 3. We will change her medication with discontinuing Diovan, increasing valsartan from 160-320 mg daily, discontinuing hydrochlorothiazide, starting Lasix 40 mg by mouth daily and cutting amlodipine in half from 10-5 mg daily. 4. The patient will follow in 2 weeks with Kennon Rounds Early for symptoms and blood pressure checkup with me in 2 months. 5. We will check her labs BMP to check for electrolytes and creatinine 2 weeks.    Medication Adjustments/Labs and Tests Ordered: Current medicines are reviewed at length with the patient today.  Concerns regarding medicines are outlined above.  Medication changes, Labs and Tests ordered today are listed in the Patient Instructions below. There are no Patient Instructions on file for this visit.   Signed, Tobias Alexander, MD  01/13/2016 9:26 AM    [facility]  [facility] [address] [place], [place], [place]  [address] Phone: [phone]; Fax: [phone]

## 2016-01-13 NOTE — Telephone Encounter (Signed)
Spoke with the pts daughter to inform her that there is a sulfa derivative in lasix, as well as HCTZ, which the pt was previously on.  Informed the daughter that per our PharmD Audrie Lia, it is very rare for the pt to have a cross reaction from having a sulfa allergy with taking lasix, even though the pt has an allergy to sulfa antibiotics.  Informed the daughter that the pt was taking HCTZ and this too has a derivative of sulfa in it, and the pt had no complications with this.  Daughter states that the pt only had a very mild skin reaction to sulfa antibiotics numerous years ago. Advised the pts daughter that per Dr Delton See and Kennon Rounds, the pt should continue as advised at today's OV, and start taking lasix 40 mg po daily.   Daughter also calling to inform Dr Delton See that she forgot to inform her that the pt was already taking amlodipine 5 mg po daily, and not 10 mg, for Dr Andrey Campanile PCP decreased this back in April.  Daughter states that she forgot to mention this to Dr Delton See today, for Dr Delton See advised for the pt to decrease her amlodipine from 10 mg to 5 mg po daily.  Informed the pts daughter that I spoke with Dr Delton See, and given this information, the pt should now decrease her amlodipine to 2.5 mg po daily.  Confirmed the pharmacy of choice with the pts daughter. Sent new dose for amlodipine to pts confirmed pharmacy of choice.  Daughter verbalized understanding and agrees with this plan.  Daughter gracious for all the assistance provided.

## 2016-01-13 NOTE — Patient Instructions (Signed)
Medication Instructions:   STOP TAKING VALSARTAN-HCTZ NOW  DECREASE YOUR AMLODIPINE TO 5 MG ONCE DAILY  START TAKING VALSARTAN 320 MG ONCE DAILY  START TAKING  LASIX  40 MG ONCE DAILY     Testing/Procedures:  Your physician has requested that you have an echocardiogram. Echocardiography is a painless test that uses sound waves to create images of your heart. It provides your doctor with information about the size and shape of your heart and how well your heart's chambers and valves are working. This procedure takes approximately one hour. There are no restrictions for this procedure.    Follow-Up:  2 WEEKS WITH SALLY EARL IN OUR [facility]  3 MONTHS WITH DR Delton See       If you need a refill on your cardiac medications before your next appointment, please call your pharmacy.

## 2016-01-13 NOTE — Telephone Encounter (Signed)
New message  Pt c/o medication issue: 1. Name of Medication:furosemide (LASIX) 40 MG tablet  4. What is your medication issue? Pt is allergic to Sulfa, Daughter called to inform the provider that Lasix has this ingredient. Please call back to discuss an alternative medication   And for the amLODipine (NORVASC) 5 MG tablet Dr. Delton See wanted her to go to 5 mg and the pt is already at 5 mg. Would like a call back to discuss this as well.

## 2016-01-14 ENCOUNTER — Telehealth: Payer: Self-pay | Admitting: Cardiology

## 2016-01-14 MED ORDER — AMLODIPINE BESYLATE 2.5 MG PO TABS
2.5000 mg | ORAL_TABLET | Freq: Every day | ORAL | Status: DC
Start: 2016-01-14 — End: 2016-01-25

## 2016-01-14 NOTE — Telephone Encounter (Signed)
New Message   *STAT* If patient is at the pharmacy, call can be transferred to refill team.   1. Which medications need to be refilled? (please list name of each medication and dose if known) amLODipine (NORVASC) 5 MG tablet  ----Lowering the dosage from 5mg  to 2.5  2. Which pharmacy/location (including street and city if local pharmacy) is medication to be sent to? [facility] in [place]   3. Do they need a 30 day or 90 day supply? 30 day   Comments: Pt daughter called. States that one of the scripts that was supposed to be called in wasn't received.

## 2016-01-14 NOTE — Telephone Encounter (Signed)
Rx sent to pt's pharmacy as requested. Confirmation received. °

## 2016-01-17 ENCOUNTER — Telehealth: Payer: Self-pay | Admitting: Cardiology

## 2016-01-17 NOTE — Telephone Encounter (Signed)
Pt's daugher [name] calling re LASIX-due to mom's age and side effects wants to know if she can change med? pls call

## 2016-01-17 NOTE — Telephone Encounter (Signed)
Spoke with pt daughter, okay per DPR, she is worried about mother starting Lasix as she is worried about the side effects of the medication. She reviewed the medication side effects and is very worried about dizziness and the urgency to urinate. Mother already walks with cane/walker and has dizziness from time to time and is home alone most times, worried about falls if starting this medication.  Pt has not started any of the recommendations from her 6/1 OV as she just got refill from pharmacy over the weekend. Pt daughter wants to know if there is another medication her mother can take that does not have the same side effects as Lasix. She will wait to hear back from Dr. Delton See before starting any of the medications/or making changes.  Pt daughter educated on the benefits Lasix and she still would like to hear from Dr. Delton See on options.

## 2016-01-18 NOTE — Telephone Encounter (Signed)
All diuretics have the same side effect, she can try taking just half dose 20 mg po daily and see how she feels with it, if she doesn't tolerate it I would discontinue then

## 2016-01-18 NOTE — Telephone Encounter (Signed)
Spoke with the pts daughter to inform her that per Dr Delton See, all diuretics have the same side effect.  Informed the pts daughter that Dr Delton See said she can try 1/2 dose of lasix (20 mg po daily), and see how she feels with that.  Informed the daughter that per Dr Delton See, if she doesn't tolerate it, then she will consider discontinuation of this med.  Per the daughter, she states the pt has not started her regimen of lasix 40 mg po daily yet, for the daughter reports she read the side effects of this drug on-line, and just wanted to clarify with Dr Delton See that this med is safe for the pt to take.  Daughter states she will start the pt off by trying the 40 mg regimen, and if that doesn't work, then she will call our office and request for a decrease of lasix to 20 mg po daily.  Daughter states that she will keep [place] informed, in regards to the pt tolerating lasix appropriately.  Daughter verbalized understanding and agrees with this plan.

## 2016-01-19 ENCOUNTER — Telehealth: Payer: Self-pay | Admitting: Nurse Practitioner

## 2016-01-19 NOTE — Telephone Encounter (Signed)
Anna Mccoy with Dr Kenna Gilbert office made several attempts to schedule [name] for a consult for her diarrhea and weight loss and left voicemails. I reached out and spoke with [name] this morning and she declined an appointment at this time. She stated it is beginning to "improve a little and I'd rather wait to see if it keeps getting better. I just have all these appointments all the sudden. I'd rather wait". I provided her the contact information to Dr Kenna Gilbert office and she agreed to call and schedule if the problem persisted. Anna Mccoy at Dr [facility] office is aware and will keep referral.  Please advise any additional plan.

## 2016-01-19 NOTE — Telephone Encounter (Signed)
No further action is needed at this time. Ok to close encounter.

## 2016-01-24 DIAGNOSIS — M0589 Other rheumatoid arthritis with rheumatoid factor of multiple sites: Secondary | ICD-10-CM | POA: Diagnosis not present

## 2016-01-25 ENCOUNTER — Telehealth: Payer: Self-pay | Admitting: Cardiology

## 2016-01-25 ENCOUNTER — Encounter: Payer: Self-pay | Admitting: Cardiology

## 2016-01-25 MED ORDER — AMLODIPINE BESYLATE 5 MG PO TABS: 5.0000 mg | ORAL_TABLET | Freq: Every day | ORAL | Status: DC

## 2016-01-25 MED ORDER — VALSARTAN-HYDROCHLOROTHIAZIDE 160-25 MG PO TABS: 1.0000 | ORAL_TABLET | Freq: Every day | ORAL | Status: DC

## 2016-01-25 NOTE — Telephone Encounter (Signed)
Pt and pts daughter is calling to inform Dr Delton See that they both decided to go back to the pts old regimen of taking Diovan-HCTZ 160-25- take 1 tab po daily, vs taking her prescribed lasix 40 mg po daily recommended by Dr Delton See.  Both parties state they also went back to the pts old regimen of amlodipine 5 mg po daily.  Per both parties, the pt wants to go back to her old regimen, for she tolerates this much better.  Pt states she was "sick and tired of constantly having to get up and go to the bathroom throughout the course of the day." Pt and daughter both state that she took her pills in the morning, and still continuously had to go urinate all the day, with no periods of rest. Pt states she stopped taking lasix after one day of use.  Pt and daughter state that they started her back on her Diovan-Hct regimen of 160-25 and her old dose of amlodipine 5 mg po daily.  Pt states "I feel back to myself on my old regimen." Informed the pt and daughter that I will let Dr Delton See know this information, and change this in the pts med list.  Both daughter and pt reiterated that they are not interested in taking any other medications, just only what she use to be on.  Both verbalized understanding and agrees with this plan.

## 2016-01-25 NOTE — Telephone Encounter (Signed)
New message   Daughter calling  [name]  last office visit on  6/1   Pt C/O medication issue:  1. Name of Medication: furosemide    2. How are you currently taking this medication (dosage and times per day)? 40 mg   3. Are you having a reaction (difficulty breathing--STAT)? Anxious, tired out    4. What is your medication issue? Frequency urination - patient states she unable to do that much / after fist dosage discontinue - went back to amlodipine 5 mg & combine pill for blood pressure and fluid.

## 2016-01-25 NOTE — Telephone Encounter (Signed)
That's ok.

## 2016-01-25 NOTE — Telephone Encounter (Signed)
Notified the pt and daughter that per Dr Delton See, its ok for the pt to switch back to her old medication regimen.  Confirmed the pts current medications with the old regimen introduced.  Medications are up-to-date.  Both the pt and daughter verbalized understanding, agrees with this plan, and gracious for all the assistance provided.

## 2016-02-01 DIAGNOSIS — H40013 Open angle with borderline findings, low risk, bilateral: Secondary | ICD-10-CM | POA: Diagnosis not present

## 2016-02-01 DIAGNOSIS — H04123 Dry eye syndrome of bilateral lacrimal glands: Secondary | ICD-10-CM | POA: Diagnosis not present

## 2016-02-01 DIAGNOSIS — M3501 Sicca syndrome with keratoconjunctivitis: Secondary | ICD-10-CM | POA: Diagnosis not present

## 2016-02-04 ENCOUNTER — Other Ambulatory Visit: Payer: Self-pay

## 2016-02-04 ENCOUNTER — Ambulatory Visit (HOSPITAL_COMMUNITY): Payer: Medicare Other | Attending: Cardiovascular Disease

## 2016-02-04 ENCOUNTER — Ambulatory Visit (INDEPENDENT_AMBULATORY_CARE_PROVIDER_SITE_OTHER): Payer: Medicare Other | Admitting: Pharmacist

## 2016-02-04 VITALS — BP 118/80

## 2016-02-04 DIAGNOSIS — I358 Other nonrheumatic aortic valve disorders: Secondary | ICD-10-CM | POA: Diagnosis not present

## 2016-02-04 DIAGNOSIS — I110 Hypertensive heart disease with heart failure: Secondary | ICD-10-CM | POA: Insufficient documentation

## 2016-02-04 DIAGNOSIS — R600 Localized edema: Secondary | ICD-10-CM

## 2016-02-04 DIAGNOSIS — I10 Essential (primary) hypertension: Secondary | ICD-10-CM

## 2016-02-04 DIAGNOSIS — I340 Nonrheumatic mitral (valve) insufficiency: Secondary | ICD-10-CM | POA: Diagnosis not present

## 2016-02-04 DIAGNOSIS — I509 Heart failure, unspecified: Secondary | ICD-10-CM | POA: Insufficient documentation

## 2016-02-04 DIAGNOSIS — I639 Cerebral infarction, unspecified: Secondary | ICD-10-CM

## 2016-02-04 LAB — ECHOCARDIOGRAM COMPLETE
Ao-asc: 34 cm
E decel time: 303 msec
E/e' ratio: 8
FS: 33 % (ref 28–44)
IVS/LV PW RATIO, ED: 0.97
LA ID, A-P, ES: 30 mm
LA diam end sys: 30 mm
LA diam index: 2.19 cm/m2
LA vol A4C: 18.6 ml
LA vol index: 14.5 mL/m2
LA vol: 19.9 mL
LV E/e' medial: 8
LV E/e'average: 8
LV PW d: 8.37 mm — AB (ref 0.6–1.1)
LV e' LATERAL: 8.73 cm/s
LVOT SV: 62 mL
LVOT VTI: 24.5 cm
LVOT area: 2.54 cm2
LVOT diameter: 18 mm
LVOT peak vel: 106 cm/s
Lateral S' vel: 8.59 cm/s
MV Dec: 303
MV pk A vel: 77.6 m/s
MV pk E vel: 69.8 m/s
PV Reg vel dias: 59.7 cm/s
RV sys press: 31 mmHg
Reg peak vel: 240 cm/s
TAPSE: 19.9 mm
TDI e' lateral: 8.73
TDI e' medial: 6.66
TR max vel: 240 cm/s

## 2016-02-04 NOTE — Progress Notes (Signed)
Patient ID: Anna Mccoy                 DOB: 21-Jul-1925                      MRN: [id number]     HPI: TATISHA CERINO is a 82 y.o. female referred by Dr. Delton See to HTN clinic.  She was seen by Dr. Delton See on 01/13/16 for increasing lower extremity edema.  It was thought to be a combination of diastolic dysfunction as well as vasodilator use and sitting position.  Her HCTZ was changed to furosemide and amlodipine dose decreased.  PT called back on 6/13 to report she went back to her previous regimen because she did not like how often the furosemide caused her to go to the restroom.  She states she felt better on HCTZ and higher dose of amlodipine.  She is here today for follow up.   Pt is here with her daughter.  Daughter states pt only took 1 dose of the furosemide and stated she went to the bathroom all morning and so she did not want to take any more.  She states it was hard to tell if her lower extremity edema improved at all during that time since it was just one dose.  Pt continues to have 1-2+ pitting edema in both legs.  She does not wear her compression hose on a regular basis.  She does not check her BP at home.  No orthostasis, dizziness, HA or change in vision noted.   Current HTN meds:amlodipine 5mg , valsartan/HCTZ 160/25  Previously tried: furosemide- pt did not like frequent urination BP goal: <150/90   Wt Readings from Last 3 Encounters:  01/13/16 99 lb (44.906 kg)  01/11/16 98 lb (44.453 kg)  12/31/15 98 lb 6.4 oz (44.634 kg)   BP Readings from Last 3 Encounters:  02/04/16 118/80  01/13/16 150/80  01/11/16 136/80   Pulse Readings from Last 3 Encounters:  01/13/16 74  01/11/16 92  12/31/15 79    Renal function: CrCl cannot be calculated (Unknown ideal weight.).  Past Medical History  Diagnosis Date  . Abnormal EKG   . Hypertension   . Thyroid disease     HYPOTHYROIDISM  . Rheumatoid arthritis(714.0)   . Hx-TIA (transient ischemic attack)   . Chest pain     H/O  . Fatigue   . RBBB   . Aortic valve sclerosis   . Mitral valve regurgitation     TRIVIAL  . Tricuspid valve regurgitation     MILD  . Procidentia of uterus     uses pessary  . Menopausal state age 31  . Vitamin D deficiency disease   . DDD (degenerative disc disease), lumbar   . Post-menopausal bleeding 02/2011    Endo Biopsy 7/12 benign, no hyperplasia  . Arthritis   . Stroke [facility])     Current Outpatient Prescriptions on File Prior to Visit  Medication Sig Dispense Refill  . abatacept (ORENCIA) 250 MG injection Inject into the vein every 30 (thirty) days.     [facility] acetaminophen (TYLENOL) 500 MG tablet Take 500 mg by mouth as needed for mild pain.     Marland Kitchen aspirin 81 MG tablet Take 81 mg by mouth daily.     Marland Kitchen atorvastatin (LIPITOR) 40 MG tablet Take 1 tablet (40 mg total) by mouth daily at 6 PM. 30 tablet 0  . divalproex (DEPAKOTE) 250 MG DR tablet Take 1 tablet (250  mg total) by mouth every 12 (twelve) hours. 60 tablet 0  . folic acid (FOLVITE) 1 MG tablet Take 1 mg by mouth daily.    . IRON PO Take 1 tablet by mouth daily.    Marland Kitchen leflunomide (ARAVA) 20 MG tablet Take 20 mg by mouth every other day.    . levothyroxine (SYNTHROID, LEVOTHROID) 75 MCG tablet Take 75 mcg by mouth daily before breakfast.    . metroNIDAZOLE (METROGEL) 0.75 % vaginal gel Place one applicator at night for 5 days. (Patient taking differently: Mon, wed Fri) 70 g 0  . Multiple Vitamins-Minerals (OCUVITE PRESERVISION PO) Take by mouth.    Marland Kitchen POLY-IRON 150 150 MG capsule   0  . Polyethyl Glycol-Propyl Glycol (SYSTANE) 0.4-0.3 % SOLN Place 1 drop into both eyes daily.    . potassium chloride SA (K-DUR,KLOR-CON) 20 MEQ tablet Take 20 mEq by mouth 2 (two) times daily.    Marland Kitchen PREMARIN vaginal cream APPLY VAGINALLY 3 TIMES A WEEK 30 g 3  . valsartan-hydrochlorothiazide (DIOVAN HCT) 160-25 MG tablet Take 1 tablet by mouth daily.    . Vitamin D, Ergocalciferol, (DRISDOL) 50000 UNITS CAPS Take 50,000 Units by mouth every  14 (fourteen) days.     No current facility-administered medications on file prior to visit.    Allergies  Allergen Reactions  . Ciprofloxacin Diarrhea  . Lidocaine   . Neosporin [Neomycin-Bacitracin Zn-Polymyx]   . Sulfa Antibiotics      Assessment/Plan:  1. LE edema- Pt's edema relatively unchanged per daughter.  This is expected given she did not change her medications.  She is not interested in trying the furosemide again.  Given that information, will try to modify other risk factors as much as possible.  BP was good today.  Will decrease amlodipine since this may be contributing.  Also instructed pt to wear compression stockings while she was awake.  Will call in 2 weeks to see if there is any improvement in swelling.

## 2016-02-04 NOTE — Patient Instructions (Signed)
Decrease amlodipine to 2.5mg  daily  Try to wear your compression stockings at least during the day.    I will call to check on your swelling in 2 weeks to see if we need to change any medications.

## 2016-02-21 DIAGNOSIS — M0589 Other rheumatoid arthritis with rheumatoid factor of multiple sites: Secondary | ICD-10-CM | POA: Diagnosis not present

## 2016-03-08 ENCOUNTER — Ambulatory Visit: Payer: Medicare Other | Admitting: Nurse Practitioner

## 2016-03-13 ENCOUNTER — Ambulatory Visit (INDEPENDENT_AMBULATORY_CARE_PROVIDER_SITE_OTHER): Payer: Medicare Other | Admitting: Nurse Practitioner

## 2016-03-13 ENCOUNTER — Encounter: Payer: Self-pay | Admitting: Nurse Practitioner

## 2016-03-13 VITALS — BP 134/92 | HR 80 | Ht 59.0 in | Wt 101.0 lb

## 2016-03-13 DIAGNOSIS — I639 Cerebral infarction, unspecified: Secondary | ICD-10-CM | POA: Diagnosis not present

## 2016-03-13 DIAGNOSIS — IMO0002 Reserved for concepts with insufficient information to code with codable children: Secondary | ICD-10-CM

## 2016-03-13 DIAGNOSIS — N816 Rectocele: Secondary | ICD-10-CM | POA: Diagnosis not present

## 2016-03-13 DIAGNOSIS — N8110 Cystocele, unspecified: Secondary | ICD-10-CM | POA: Diagnosis not present

## 2016-03-13 NOTE — Progress Notes (Signed)
Reviewed personally.  M. Suzanne Phinley Schall, MD.  

## 2016-03-13 NOTE — Progress Notes (Signed)
Subjective:   82 y.o. Widowed Black female G5P5000 here for pessary check.  Patient has been using following pessary style and size:  2 3/4 " ring with support.  She describes the following issues with the pessary:  none.   Use of protective clothing such as Depends or pads Yes.  . Problems with protective clothing with rash No..  Constipation issues with use of pessary No..  She is not sexually active.   Daughter Talbert Forest, who retired from her job in [place] after 40 yrs is now living with her.  She is very responsible for her mother and really takes care of her.  She does encourage exercise, and being independent with her watchful eye.  ROS:   no breast pain or new or enlarging lumps on self exam,  no abnormal bleeding, pelvic pain or discharge,   no dysuria, trouble voiding or hematuria. Compliant to use of vaginal cream No. Because of not being able to afford medication until 8/1 she has been out of Premarin for the past 2 weeks.  She has not noted any vaginal spotting.   She is having problems with swelling of her legs.  She was given Lasix 40 mg daily per cardiologist but she did not take due to having to go to the bathroom all the time.  She has med at home and it was suggested that she try 1/2 dose = 20 mg prn.  She will look and see if she still has at home.  General Exam:    BP (!) 134/92 (BP Location: Right Arm, Patient Position: Sitting, Cuff Size: Normal)   Pulse 80   Ht 4\' 11"  (1.499 m)   Wt 101 lb (45.8 kg)   LMP 08/14/1981   BMI 20.40 kg/m   General appearance: alert, cooperative and appears stated age  She seems more alert and responsive today.   Pelvic: External genitalia:  no lesions   Before pessary was removed No. prolapse over the pessary   In correct position Yes.                Urethra: normal appearing urethra with no masses, tenderness or lesions              Vagina: normal appearing vagina with normal color and discharge, no lesions.  There are No abrasions or  ulcerations.               Cervix: normal appearance Cervical lesions were not found   Bimanual Exam:  Uterus:  not examined                               Adnexa:    not indicated                             Pessary was removed without difficulty without using forceps.  Pessary was cleansed with Betadine.  Pessary was replaced. Patient tolerated procedure well.    Assessment :  SUI, Cystocele- symptomatic, Rectocele- asymptomatic        Use of pessary continued   Plan:    Return to office in 2.5 months for recheck.         she will call earlier if any problems  An After Visit Summary was printed and given to the patient.

## 2016-03-13 NOTE — Patient Instructions (Addendum)
Try taking Lasix 40 mg (furosemide) at 1/2 tablet =20 mg as needed for swelling.  Dr. Delton See at Cardiology.

## 2016-03-23 DIAGNOSIS — M0589 Other rheumatoid arthritis with rheumatoid factor of multiple sites: Secondary | ICD-10-CM | POA: Diagnosis not present

## 2016-03-30 ENCOUNTER — Encounter: Payer: Self-pay | Admitting: Cardiology

## 2016-03-31 ENCOUNTER — Ambulatory Visit (INDEPENDENT_AMBULATORY_CARE_PROVIDER_SITE_OTHER): Payer: Medicare Other | Admitting: Cardiology

## 2016-03-31 ENCOUNTER — Encounter: Payer: Self-pay | Admitting: Cardiology

## 2016-03-31 VITALS — BP 142/80 | HR 70 | Ht 59.0 in | Wt 103.8 lb

## 2016-03-31 DIAGNOSIS — Z5181 Encounter for therapeutic drug level monitoring: Secondary | ICD-10-CM | POA: Diagnosis not present

## 2016-03-31 DIAGNOSIS — I639 Cerebral infarction, unspecified: Secondary | ICD-10-CM

## 2016-03-31 LAB — BASIC METABOLIC PANEL
BUN: 13 mg/dL (ref 7–25)
CO2: 29 mmol/L (ref 20–31)
Calcium: 8.9 mg/dL (ref 8.6–10.4)
Chloride: 105 mmol/L (ref 98–110)
Creat: 0.62 mg/dL (ref 0.60–0.88)
Glucose, Bld: 81 mg/dL (ref 65–99)
Potassium: 4 mmol/L (ref 3.5–5.3)
Sodium: 140 mmol/L (ref 135–146)

## 2016-03-31 NOTE — Progress Notes (Signed)
03/31/2016 RASA DEGRAZIA   12-15-1924  [id number]  Primary Physician Pamelia Hoit, MD Primary Cardiologist: Dr. Delton See   Reason for Visit/CC: f/u for edema   HPI:  Anna Mccoy is a 82 y.o. female with prior medical history of hypertension, stroke in June 2016 who has noticed worsening shortness of breath and lower extremity edema for the last couple of months. The patient is still active and walks around with a cane and has noticed to get short of breath just walking around her house. She had a normal echocardiogram in 2016. She was seen by Dr. Delton See June 2017 for her dyspnea and edema and a repeat echo was ordered. This showed normal LVEF of 55-60%. Diastolic function parameters were normal.  No significant valvular disease. Dr. Delton See felt that her lower extremity edema was likely multifactorial secondary to diastolic heart failure and vasodilatorsuse for her high blood pressure. She changed her medication. She increased Valsartan from 160 mg to 320 mg daily, d/c HCTZ and added Lasix 40 mg daily and reduced her amlodipine from 10 mg to 5 mg. She was seen by pharmacy in follow-up and still had issues with edema. Amlodipine was further reduced to 2.5 mg and she was advised to wear compression stockings.   She presents back to clinic today for follow-up. She reports that she self discontinued her Lasix and restarted her hydrochlorothiazide as the increased diuresis/frequent urination with lasix was very taxing for her. She continues to have bilateral lower extremity edema however she reports that her dyspnea has improved. She denies orthopnea or PND. She does not sleep with any pillows. She sleeps in bed. She has tried elevating her legs at home on a coffee table. She reports adherence to a low-sodium regimen.   Current Outpatient Prescriptions  Medication Sig Dispense Refill  . abatacept (ORENCIA) 250 MG injection Inject into the vein every 30 (thirty) days.     Marland Kitchen acetaminophen  (TYLENOL) 500 MG tablet Take 500 mg by mouth as needed for mild pain.     Marland Kitchen amLODipine (NORVASC) 2.5 MG tablet Take 2.5 mg by mouth daily.    Marland Kitchen aspirin 81 MG tablet Take 81 mg by mouth daily.     Marland Kitchen atorvastatin (LIPITOR) 40 MG tablet Take 1 tablet (40 mg total) by mouth daily at 6 PM. 30 tablet 0  . Carboxymethylcellul-Glycerin (REFRESH OPTIVE) 1-0.9 % GEL Apply 1 drop to eye at bedtime.    . conjugated estrogens (PREMARIN) vaginal cream Place 1 Applicatorful vaginally 2 (two) times a week.    . divalproex (DEPAKOTE) 250 MG DR tablet Take 1 tablet (250 mg total) by mouth every 12 (twelve) hours. 60 tablet 0  . folic acid (FOLVITE) 1 MG tablet Take 1 mg by mouth daily.    . IRON PO Take 1 tablet by mouth daily.    Marland Kitchen leflunomide (ARAVA) 20 MG tablet Take 20 mg by mouth every other day.    . levothyroxine (SYNTHROID, LEVOTHROID) 75 MCG tablet Take 75 mcg by mouth daily before breakfast.    . Multiple Vitamins-Minerals (OCUVITE PRESERVISION PO) Take 1 tablet by mouth 2 (two) times daily.     Marland Kitchen POLY-IRON 150 150 MG capsule Take 150 mg by mouth daily.   0  . Polyethyl Glycol-Propyl Glycol (SYSTANE) 0.4-0.3 % SOLN Place 1 drop into both eyes daily.    . potassium chloride SA (K-DUR,KLOR-CON) 20 MEQ tablet Take 20 mEq by mouth 2 (two) times daily.    . valsartan-hydrochlorothiazide (DIOVAN  HCT) 160-25 MG tablet Take 1 tablet by mouth daily.    . Vitamin D, Ergocalciferol, (DRISDOL) 50000 UNITS CAPS Take 50,000 Units by mouth every 14 (fourteen) days.     No current facility-administered medications for this visit.     Allergies  Allergen Reactions  . Ciprofloxacin Diarrhea  . Lidocaine   . Neosporin [Neomycin-Bacitracin Zn-Polymyx]   . Sulfa Antibiotics     Social History   Social History  . Marital status: Widowed    Spouse name: N/A  . Number of children: 5  . Years of education: 12   Occupational History  . Retired     Social History Main Topics  . Smoking status: Never Smoker  .  Smokeless tobacco: Never Used  . Alcohol use No  . Drug use: No  . Sexual activity: No     Comment: Widowed 01/10/07 age 40   Other Topics Concern  . Not on file   Social History Narrative   Patient is right handed, resides with daughter and sister.    Caffeine use: Coffee (drinks 1/2-1 cup per day)     Review of Systems: General: negative for chills, fever, night sweats or weight changes.  Cardiovascular: negative for chest pain, dyspnea on exertion, edema, orthopnea, palpitations, paroxysmal nocturnal dyspnea or shortness of breath Dermatological: negative for rash Respiratory: negative for cough or wheezing Urologic: negative for hematuria Abdominal: negative for nausea, vomiting, diarrhea, bright red blood per rectum, melena, or hematemesis Neurologic: negative for visual changes, syncope, or dizziness All other systems reviewed and are otherwise negative except as noted above.    Blood pressure (!) 142/80, pulse 70, height 4\' 11"  (1.499 m), weight 103 lb 12.8 oz (47.1 kg), last menstrual period 08/14/1981.  General appearance: alert, cooperative, no distress and elderly Neck: no carotid bruit and no JVD Lungs: clear to auscultation bilaterally Heart: Irregular rhythm, regular rate Extremities: 1+ bilateral LEE pitting edema Pulses: 2+ and symmetric Skin: warm and dry Neurologic: Grossly normal  EKG SR with PACs  ASSESSMENT AND PLAN:   1. Bilateral LEE: 2D echo showed normal LVEF. No valvular abnormalities. She continues to have bilateral LEE. She did not tolerate Lasix. The increased diureses/ frequent urination was "too taxing for her". She has stopped lasix and restarted her HCTZ. She understands that this is a weaker diuretic and will not improve her edema as much his Lasix. She is not willing to restart this. We will recheck a BMP today to ensure renal function and K are both stable. We discussed maintaining a low-sodium diet including close monitoring of food labels  to ensure that she is in fact eating a low-sodium diet. We also discussed elevating the legs above the level of the heart if possible to help with her edema. Also encouraged use of compression stockings. I gave Rx for above the knee stockings.   2. HTN: BP is stable.   PLAN  F/u with Dr. 10/12/1981 in 2 months.   Erilyn Pearman PA-C 03/31/2016 4:00 PM

## 2016-03-31 NOTE — Patient Instructions (Addendum)
Medication Instructions:   Your physician recommends that you continue on your current medications as directed. Please refer to the Current Medication list given to you today.   If you need a refill on your cardiac medications before your next appointment, please call your pharmacy.  Labwork: TODAY BMET  Testing/Procedures: NONE ORDER TODAY    Follow-Up:  WITH DR NELSON IN 2 TO 3 MONTHS ONLY ..NOT AN APP PER NELSON NOTE   Any Other Special Instructions Will Be Listed Below (If Applicable).  1. YOU HAVE BEEN RECOMMENDED TO WEAR COMPRESSION STOCKINGS DURING THE DAY AND MAY         TAKE OFF AT NIGHT.   2. ALONG WITH KEEP LEG ELEVATION AS MUCH AS POSSIBLE WHILE RESTING  3. A COMPRESSION PRESCRIPTION HAS BEEN FILLED OUT FOR YOU TO TAKE TO YOUR LOCAL        MEDICAL SUPPLY STORE FOR FURTHER ASSISTANCE FOR APPROPRIATE SIZED COMPRESSION        STOCKING.

## 2016-04-19 ENCOUNTER — Ambulatory Visit: Payer: Medicare Other | Admitting: Cardiology

## 2016-04-20 DIAGNOSIS — R5383 Other fatigue: Secondary | ICD-10-CM | POA: Diagnosis not present

## 2016-04-20 DIAGNOSIS — M0589 Other rheumatoid arthritis with rheumatoid factor of multiple sites: Secondary | ICD-10-CM | POA: Diagnosis not present

## 2016-05-25 DIAGNOSIS — M0589 Other rheumatoid arthritis with rheumatoid factor of multiple sites: Secondary | ICD-10-CM | POA: Diagnosis not present

## 2016-05-30 ENCOUNTER — Encounter: Payer: Self-pay | Admitting: Nurse Practitioner

## 2016-05-30 ENCOUNTER — Ambulatory Visit (INDEPENDENT_AMBULATORY_CARE_PROVIDER_SITE_OTHER): Payer: Medicare Other | Admitting: Nurse Practitioner

## 2016-05-30 VITALS — BP 112/74 | HR 68 | Ht 59.0 in | Wt 100.0 lb

## 2016-05-30 DIAGNOSIS — Z4689 Encounter for fitting and adjustment of other specified devices: Secondary | ICD-10-CM

## 2016-05-30 DIAGNOSIS — I639 Cerebral infarction, unspecified: Secondary | ICD-10-CM

## 2016-05-30 NOTE — Patient Instructions (Signed)
Recheck in 2 1/2 month.

## 2016-05-30 NOTE — Progress Notes (Signed)
Subjective:   82 y.o. Widowed Black female G5P5000 here for pessary check.  Patient has been using following pessary style and size:  2 3/4" ring with support.  She describes the following issues with the pessary: none.   Use of protective clothing such as Depends or pads Yes.  . Problems with protective clothing with rash No..  Constipation issues with use of pessary No.  Most recently she is having problems with hair loss that is frontal.  She has discussed with PCP.   She is not sexually active.     ROS:   no breast pain or new or enlarging lumps on self exam,  no abnormal bleeding, pelvic pain or discharge,   no dysuria, trouble voiding or hematuria. Compliant to use of vaginal cream Yes.   General Exam:    BP 112/74 (BP Location: Right Arm, Patient Position: Sitting, Cuff Size: Normal)   Pulse 68   Ht 4\' 11"  (1.499 m)   Wt 100 lb (45.4 kg)   LMP 08/14/1981   BMI 20.20 kg/m   General appearance: alert, cooperative and appears stated age   Pelvic: External genitalia:  no lesions   Before pessary was removed No. prolapse over the pessary   In correct position Yes.                Urethra: normal appearing urethra with no masses, tenderness or lesions              Vagina: normal appearing vagina with normal color and discharge, no lesions.  There are No abrasions or ulcerations.               Cervix: normal appearance Cervical lesions were not found   Bimanual Exam:  Uterus:  not examined                               Adnexa:    not indicated                             Pessary was removed without difficulty without using forceps.  Pessary was cleansed with Betadine.  Pessary was replaced. Patient tolerated procedure well.    Assessment :   SUI, Cystocele- symptomatic, Rectocele- asymptomatic        Leg edema     Use of pessary continued      New event of hair loss - mainly frontal area   Plan:    Return to office in 2.5 months for recheck.         Continue follow with PCP    An After Visit Summary was printed and given to the patient.

## 2016-06-01 DIAGNOSIS — R600 Localized edema: Secondary | ICD-10-CM | POA: Diagnosis not present

## 2016-06-01 DIAGNOSIS — E038 Other specified hypothyroidism: Secondary | ICD-10-CM | POA: Diagnosis not present

## 2016-06-01 DIAGNOSIS — E039 Hypothyroidism, unspecified: Secondary | ICD-10-CM | POA: Diagnosis not present

## 2016-06-01 DIAGNOSIS — Z23 Encounter for immunization: Secondary | ICD-10-CM | POA: Diagnosis not present

## 2016-06-01 DIAGNOSIS — M069 Rheumatoid arthritis, unspecified: Secondary | ICD-10-CM | POA: Diagnosis not present

## 2016-06-01 DIAGNOSIS — I10 Essential (primary) hypertension: Secondary | ICD-10-CM | POA: Diagnosis not present

## 2016-06-01 DIAGNOSIS — I6789 Other cerebrovascular disease: Secondary | ICD-10-CM | POA: Diagnosis not present

## 2016-06-04 NOTE — Progress Notes (Signed)
Encounter reviewed by Dr. Brook Amundson C. Silva.  

## 2016-06-19 DIAGNOSIS — M3501 Sicca syndrome with keratoconjunctivitis: Secondary | ICD-10-CM | POA: Diagnosis not present

## 2016-06-19 DIAGNOSIS — H40013 Open angle with borderline findings, low risk, bilateral: Secondary | ICD-10-CM | POA: Diagnosis not present

## 2016-06-19 DIAGNOSIS — H04123 Dry eye syndrome of bilateral lacrimal glands: Secondary | ICD-10-CM | POA: Diagnosis not present

## 2016-06-19 DIAGNOSIS — H353130 Nonexudative age-related macular degeneration, bilateral, stage unspecified: Secondary | ICD-10-CM | POA: Diagnosis not present

## 2016-06-19 DIAGNOSIS — H04121 Dry eye syndrome of right lacrimal gland: Secondary | ICD-10-CM | POA: Diagnosis not present

## 2016-06-19 DIAGNOSIS — H04122 Dry eye syndrome of left lacrimal gland: Secondary | ICD-10-CM | POA: Diagnosis not present

## 2016-06-21 ENCOUNTER — Ambulatory Visit: Payer: Medicare Other | Admitting: Cardiology

## 2016-06-26 DIAGNOSIS — M0589 Other rheumatoid arthritis with rheumatoid factor of multiple sites: Secondary | ICD-10-CM | POA: Diagnosis not present

## 2016-06-28 ENCOUNTER — Encounter: Payer: Self-pay | Admitting: Physician Assistant

## 2016-06-28 NOTE — Progress Notes (Signed)
Cardiology Office Note    Date:  06/29/2016  ID:  Anna Mccoy, DOB March 24, 1925, MRN [id number] PCP:  Anna Hoit, MD  Cardiologist:  Delton See   Chief Complaint: f/u edema  History of Present Illness:  Anna Mccoy is a 82 y.o. female with history of HTN, stroke/TIA, edema/SOB felt possibly r/t chronic diastolic CHF, rheumatoid arthritis, hypothyroidism, RBBB, chronic anemia, PVCs by prior EKG who presents for f/u of edema. Previously did not tolerate Lasix due to frequent urination. 2D echo 02/04/16: EF 55-60%, normal diastolic parameters, aortic calcification of STJ, mild MR, possible right sided cor triatriatum not clinically significant, PASP 31. BNP 12/2015 was normal and Hgb 10.9. BMET 03/2016 was normal, Cr 0.62.  She returns for f/u with her daughter. She is doing well. Edema is well controlled with compression stockings. No CP, SOB, orthopnea, syncope or palpitations. Saw her PCP a short while ago - BP was mildly elevated. She was instructed to obtain a BP cuff to follow at home. She ambulates with a walker.   Past Medical History:  Diagnosis Date  . Anemia   . Arthritis   . DDD (degenerative disc disease), lumbar   . Edema   . Hx-TIA (transient ischemic attack)   . Hypertension   . Hypothyroidism    HYPOTHYROIDISM  . Menopausal state age 24  . Mitral valve regurgitation    TRIVIAL  . Post-menopausal bleeding 02/2011   Endo Biopsy 7/12 benign, no hyperplasia  . Procidentia of uterus    uses pessary  . PVC's (premature ventricular contractions)   . RBBB   . Rheumatoid arthritis(714.0)   . Stroke (HCC)   . Vitamin D deficiency disease     Past Surgical History:  Procedure Laterality Date  . CHOLECYSTECTOMY, LAPAROSCOPIC  2002  . COLONOSCOPY W/ BIOPSIES  6/09   sigmoid diverticuli recheck 5 years  . EYE SURGERY  04/24/14   Cataract sx in right eye with lens replacement.  Marland Kitchen LAMINECTOMY AND MICRODISCECTOMY LUMBAR SPINE  01/23/09  . VAGINAL DELIVERY     x5    Current Medications: Current Outpatient Prescriptions  Medication Sig Dispense Refill  . abatacept (ORENCIA) 250 MG injection Inject into the vein every 30 (thirty) days.     Marland Kitchen acetaminophen (TYLENOL) 500 MG tablet Take 500 mg by mouth as needed for mild pain.     Marland Kitchen amLODipine (NORVASC) 5 MG tablet Take 5 mg by mouth daily.    Marland Kitchen aspirin 81 MG tablet Take 81 mg by mouth daily.     Marland Kitchen atenolol (TENORMIN) 50 MG tablet Take 50 mg by mouth daily.    Marland Kitchen atorvastatin (LIPITOR) 40 MG tablet Take 1 tablet (40 mg total) by mouth daily at 6 PM. 30 tablet 0  . conjugated estrogens (PREMARIN) vaginal cream Place 1 Applicatorful vaginally 2 (two) times a week.    . divalproex (DEPAKOTE) 250 MG DR tablet Take 1 tablet (250 mg total) by mouth every 12 (twelve) hours. 60 tablet 0  . folic acid (FOLVITE) 1 MG tablet Take 1 mg by mouth daily.    Marland Kitchen leflunomide (ARAVA) 20 MG tablet Take 20 mg by mouth every other day.    . levothyroxine (SYNTHROID, LEVOTHROID) 75 MCG tablet Take 75 mcg by mouth daily before breakfast.    . megestrol (MEGACE) 400 MG/10ML suspension Take 400 mg by mouth daily.    . mirtazapine (REMERON) 15 MG tablet Take 15 mg by mouth at bedtime.    . Multiple Vitamins-Minerals (OCUVITE  PRESERVISION PO) Take 1 tablet by mouth 2 (two) times daily.     Marland Kitchen POLY-IRON 150 150 MG capsule Take 150 mg by mouth daily.   0  . Polyethyl Glycol-Propyl Glycol (SYSTANE) 0.4-0.3 % SOLN Place 1 drop into both eyes 3 (three) times daily.     . potassium chloride SA (K-DUR,KLOR-CON) 20 MEQ tablet Take 20 mEq by mouth 2 (two) times daily.    . valsartan-hydrochlorothiazide (DIOVAN HCT) 160-25 MG tablet Take 1 tablet by mouth daily.    . Vitamin D, Ergocalciferol, (DRISDOL) 50000 UNITS CAPS Take 50,000 Units by mouth every 14 (fourteen) days.     No current facility-administered medications for this visit.      Allergies:   Ciprofloxacin; Lidocaine; Neosporin [neomycin-bacitracin zn-polymyx]; and Sulfa  antibiotics   Social History   Social History  . Marital status: Widowed    Spouse name: N/A  . Number of children: 5  . Years of education: 12   Occupational History  . Retired     Social History Main Topics  . Smoking status: Never Smoker  . Smokeless tobacco: Never Used  . Alcohol use No  . Drug use: No  . Sexual activity: No     Comment: Widowed 01/10/07 age 66   Other Topics Concern  . None   Social History Narrative   Patient is right handed, resides with daughter and sister.    Caffeine use: Coffee (drinks 1/2-1 cup per day)     Family History:  The patient's family history includes Diabetes in her sister and sister; Hypertension in her mother, sister, and sister; Stroke in her mother.   ROS:   Please see the history of present illness. All other systems are reviewed and otherwise negative.    PHYSICAL EXAM:   VS:  BP (!) 146/88   Pulse 75   Ht 4\' 11"  (1.499 m)   Wt 100 lb 1.9 oz (45.4 kg)   LMP 08/14/1981   BMI 20.22 kg/m   BMI: Body mass index is 20.22 kg/m. GEN: Well nourished, well developed thin elderly F, in no acute distress  HEENT: normocephalic, atraumatic Neck: no JVD, carotid bruits, or masses Cardiac:RRR; no murmurs, rubs, or gallops, no edema with compression hose in place Respiratory:  clear to auscultation bilaterally, normal work of breathing GI: soft, nontender, nondistended, + BS MS: no deformity or atrophy  Skin: warm and dry, no rash Neuro:  Alert and Oriented x 3, Strength and sensation are intact, follows commands Psych: euthymic mood, full affect  Wt Readings from Last 3 Encounters:  06/29/16 100 lb 1.9 oz (45.4 kg)  05/30/16 100 lb (45.4 kg)  03/31/16 103 lb 12.8 oz (47.1 kg)      Studies/Labs Reviewed:   EKG:  EKG was ordered today and personally reviewed by me and demonstrates sinus rhythm with PACs, RBBB, prior septal and inferior infarct, pulm disease pattern  Recent Labs: 12/18/2015: B Natriuretic Peptide 109.9;  Hemoglobin 10.9; Platelets 228 03/31/2016: BUN 13; Creat 0.62; Potassium 4.0; Sodium 140   Lipid Panel    Component Value Date/Time   CHOL 197 02/03/2015 0705   TRIG 61 02/03/2015 0705   HDL 65 02/03/2015 0705   CHOLHDL 3.0 02/03/2015 0705   VLDL 12 02/03/2015 0705   LDLCALC 120 (H) 02/03/2015 0705    Additional studies/ records that were reviewed today include: Summarized above.    ASSESSMENT & PLAN:   1. Edema - controlled on low dose HCTZ (in combination with ARB) as  well as compression hose. Daughter is interested in the zip-up hose. I will touch base with one of our NPs who I know has used these in the past with an order form. I'll touch base with her. Advised to continue low sodium diet. 2. HTN - BP running mildly elevated. Given her advanced age and frailty would not be too aggressive with management, but do need to trend this to see where it's running. The daughter plans to get a BP cuff today. I advised them to call in if BP tending to run >140/90. I offered pharmD visit in one week but they declined. 3. Chronic anemia - further monitoring per primary care 4. RBBB - chronic. 5. PVCs on EKG in 03/2016 - occ PACs noted today, asymptomatic. Continue atenolol as tolerated.  Disposition: F/u with Dr. Delton See in 6 months.   Medication Adjustments/Labs and Tests Ordered: Current medicines are reviewed at length with the patient today.  Concerns regarding medicines are outlined above. Medication changes, Labs and Tests ordered today are summarized above and listed in the Patient Instructions accessible in Encounters.   Thomasene Mohair PA-C  06/29/2016 12:16 PM    [facility] [address] [place], [place], [place]  [address] Phone: [phone]; Fax: [phone]

## 2016-06-29 ENCOUNTER — Encounter: Payer: Self-pay | Admitting: Physician Assistant

## 2016-06-29 ENCOUNTER — Ambulatory Visit (INDEPENDENT_AMBULATORY_CARE_PROVIDER_SITE_OTHER): Payer: Medicare Other | Admitting: Physician Assistant

## 2016-06-29 VITALS — BP 146/88 | HR 75 | Ht 59.0 in | Wt 100.1 lb

## 2016-06-29 DIAGNOSIS — D649 Anemia, unspecified: Secondary | ICD-10-CM

## 2016-06-29 DIAGNOSIS — I10 Essential (primary) hypertension: Secondary | ICD-10-CM | POA: Diagnosis not present

## 2016-06-29 DIAGNOSIS — I639 Cerebral infarction, unspecified: Secondary | ICD-10-CM

## 2016-06-29 DIAGNOSIS — I4510 Unspecified right bundle-branch block: Secondary | ICD-10-CM

## 2016-06-29 DIAGNOSIS — R600 Localized edema: Secondary | ICD-10-CM

## 2016-06-29 NOTE — Patient Instructions (Addendum)
Medication Instructions:  Your physician recommends that you continue on your current medications as directed. Please refer to the Current Medication list given to you today.   Labwork: None ordered  Testing/Procedures: None ordered  Follow-Up: Your physician wants you to follow-up in: 6 MONTHS WITH DR. Johnell Comings will receive a reminder letter in the mail two months in advance. If you don't receive a letter, please call our office to schedule the follow-up appointment.    Any Other Special Instructions Will Be Listed Below (If Applicable). Call if your blood pressure continues to run over 140/90.    If you need a refill on your cardiac medications before your next appointment, please call your pharmacy.

## 2016-07-27 DIAGNOSIS — M0589 Other rheumatoid arthritis with rheumatoid factor of multiple sites: Secondary | ICD-10-CM | POA: Diagnosis not present

## 2016-08-09 ENCOUNTER — Encounter: Payer: Self-pay | Admitting: Nurse Practitioner

## 2016-08-09 ENCOUNTER — Ambulatory Visit (INDEPENDENT_AMBULATORY_CARE_PROVIDER_SITE_OTHER): Payer: Medicare Other | Admitting: Nurse Practitioner

## 2016-08-09 VITALS — BP 120/68 | HR 88 | Resp 18 | Wt 100.0 lb

## 2016-08-09 DIAGNOSIS — N813 Complete uterovaginal prolapse: Secondary | ICD-10-CM

## 2016-08-09 DIAGNOSIS — Z4689 Encounter for fitting and adjustment of other specified devices: Secondary | ICD-10-CM | POA: Diagnosis not present

## 2016-08-09 DIAGNOSIS — I639 Cerebral infarction, unspecified: Secondary | ICD-10-CM

## 2016-08-09 NOTE — Patient Instructions (Signed)
Recheck in 10 weeks.

## 2016-08-09 NOTE — Progress Notes (Signed)
Subjective:   82 y.o. Widowed Black female [id number] here for pessary check.  Patient has been using following pessary style and size:  2 3/4 " ring with support.  She describes the following issues with the pessary:  none.   Use of protective clothing such as Depends or pads Yes.  . Problems with protective clothing with rash No..  Constipation issues with use of pessary No..  She is not sexually active.       Daughter who lives with her is preparing her meals and helps her get around. Encourages independence when possible to keep her moving.  The diarrhea episodes are much better.  ROS:   no breast pain or new or enlarging lumps on self exam,  no abnormal bleeding, pelvic pain or discharge,   no dysuria, trouble voiding or hematuria. Compliant to use of vaginal cream Yes.   General Exam:    BP 120/68 (BP Location: Right Arm, Patient Position: Sitting, Cuff Size: Normal)   Pulse 88   Resp 18   Wt 100 lb (45.4 kg)   LMP 08/14/1981   BMI 20.20 kg/m   General appearance: alert, cooperative and appears stated age   Pelvic: External genitalia:  no lesions   Before pessary was removed No. prolapse over the pessary   In correct position Yes.                Urethra: normal appearing urethra with no masses, tenderness or lesions              Vagina: normal appearing vagina with normal color and discharge, no lesions.  There are No abrasions or ulcerations.               Cervix: normal appearance Cervical lesions were not found   Bimanual Exam:  Uterus:  not examined                               Adnexa:    not indicated                             Pessary was removed without difficulty without using forceps.  Pessary was cleansed with Betadine.  Pessary was replaced. Patient tolerated procedure well.    Assessment :  SUI, Cystocele- symptomatic, Rectocele- asymptomatic     Use of pessary continued   Plan:    Return to office in 2.5 months for recheck.           An After Visit Summary  was printed and given to the patient.

## 2016-08-12 NOTE — Progress Notes (Signed)
Encounter reviewed by Dr. Brook Amundson C. Silva.  

## 2016-09-07 DIAGNOSIS — M0589 Other rheumatoid arthritis with rheumatoid factor of multiple sites: Secondary | ICD-10-CM | POA: Diagnosis not present

## 2016-09-15 DIAGNOSIS — R54 Age-related physical debility: Secondary | ICD-10-CM | POA: Diagnosis not present

## 2016-09-15 DIAGNOSIS — G250 Essential tremor: Secondary | ICD-10-CM | POA: Diagnosis not present

## 2016-09-15 DIAGNOSIS — M19042 Primary osteoarthritis, left hand: Secondary | ICD-10-CM | POA: Diagnosis not present

## 2016-09-15 DIAGNOSIS — E039 Hypothyroidism, unspecified: Secondary | ICD-10-CM | POA: Diagnosis not present

## 2016-09-15 DIAGNOSIS — M19041 Primary osteoarthritis, right hand: Secondary | ICD-10-CM | POA: Diagnosis not present

## 2016-09-15 DIAGNOSIS — M4000 Postural kyphosis, site unspecified: Secondary | ICD-10-CM | POA: Diagnosis not present

## 2016-09-15 DIAGNOSIS — I10 Essential (primary) hypertension: Secondary | ICD-10-CM | POA: Diagnosis not present

## 2016-10-03 DIAGNOSIS — M3501 Sicca syndrome with keratoconjunctivitis: Secondary | ICD-10-CM | POA: Diagnosis not present

## 2016-10-03 DIAGNOSIS — H04123 Dry eye syndrome of bilateral lacrimal glands: Secondary | ICD-10-CM | POA: Diagnosis not present

## 2016-10-03 DIAGNOSIS — H353130 Nonexudative age-related macular degeneration, bilateral, stage unspecified: Secondary | ICD-10-CM | POA: Diagnosis not present

## 2016-10-05 DIAGNOSIS — M0589 Other rheumatoid arthritis with rheumatoid factor of multiple sites: Secondary | ICD-10-CM | POA: Diagnosis not present

## 2016-10-07 DIAGNOSIS — M4000 Postural kyphosis, site unspecified: Secondary | ICD-10-CM

## 2016-10-07 DIAGNOSIS — R54 Age-related physical debility: Secondary | ICD-10-CM | POA: Insufficient documentation

## 2016-10-07 HISTORY — DX: Postural kyphosis, site unspecified: M40.00

## 2016-10-10 DIAGNOSIS — M19042 Primary osteoarthritis, left hand: Secondary | ICD-10-CM | POA: Diagnosis not present

## 2016-10-10 DIAGNOSIS — Z7982 Long term (current) use of aspirin: Secondary | ICD-10-CM | POA: Diagnosis not present

## 2016-10-10 DIAGNOSIS — M19041 Primary osteoarthritis, right hand: Secondary | ICD-10-CM | POA: Diagnosis not present

## 2016-10-10 DIAGNOSIS — I10 Essential (primary) hypertension: Secondary | ICD-10-CM | POA: Diagnosis not present

## 2016-10-10 DIAGNOSIS — M4000 Postural kyphosis, site unspecified: Secondary | ICD-10-CM | POA: Diagnosis not present

## 2016-10-10 DIAGNOSIS — Z9181 History of falling: Secondary | ICD-10-CM | POA: Diagnosis not present

## 2016-10-10 DIAGNOSIS — G250 Essential tremor: Secondary | ICD-10-CM | POA: Diagnosis not present

## 2016-10-12 DIAGNOSIS — I10 Essential (primary) hypertension: Secondary | ICD-10-CM | POA: Diagnosis not present

## 2016-10-12 DIAGNOSIS — Z9181 History of falling: Secondary | ICD-10-CM | POA: Diagnosis not present

## 2016-10-12 DIAGNOSIS — G250 Essential tremor: Secondary | ICD-10-CM | POA: Diagnosis not present

## 2016-10-12 DIAGNOSIS — M4000 Postural kyphosis, site unspecified: Secondary | ICD-10-CM | POA: Diagnosis not present

## 2016-10-12 DIAGNOSIS — M19041 Primary osteoarthritis, right hand: Secondary | ICD-10-CM | POA: Diagnosis not present

## 2016-10-12 DIAGNOSIS — M19042 Primary osteoarthritis, left hand: Secondary | ICD-10-CM | POA: Diagnosis not present

## 2016-10-16 DIAGNOSIS — Z9181 History of falling: Secondary | ICD-10-CM | POA: Diagnosis not present

## 2016-10-16 DIAGNOSIS — M19042 Primary osteoarthritis, left hand: Secondary | ICD-10-CM | POA: Diagnosis not present

## 2016-10-16 DIAGNOSIS — G250 Essential tremor: Secondary | ICD-10-CM | POA: Diagnosis not present

## 2016-10-16 DIAGNOSIS — I10 Essential (primary) hypertension: Secondary | ICD-10-CM | POA: Diagnosis not present

## 2016-10-16 DIAGNOSIS — M19041 Primary osteoarthritis, right hand: Secondary | ICD-10-CM | POA: Diagnosis not present

## 2016-10-16 DIAGNOSIS — M4000 Postural kyphosis, site unspecified: Secondary | ICD-10-CM | POA: Diagnosis not present

## 2016-10-17 ENCOUNTER — Encounter: Payer: Self-pay | Admitting: Nurse Practitioner

## 2016-10-17 NOTE — Progress Notes (Signed)
Patient ID: Anna Mccoy, female   DOB: 1925/08/10, 83 y.o.   MRN: [id number]  Subjective:   83 y.o. Widowed [place] American female [phone] here for pessary check.  Patient has been using following pessary style and size: 2 3/4" ring pessary with support..  She describes the following issues with the pessary:  none.   Use of protective clothing such as Depends or pads Yes.  . Problems with protective clothing with rash No..  Constipation issues with use of pessary No..  She is not sexually active.     ROS:   no breast pain or new or enlarging lumps on self exam,  no abnormal bleeding, pelvic pain or discharge,   no dysuria, trouble voiding or hematuria. Compliant to use of vaginal cream Yes.   General Exam:    LMP 08/14/1981   General appearance: alert, cooperative, appears stated age and no distress   Pelvic: External genitalia:  no lesions   Before pessary was removed No. prolapse over the pessary   In correct position Yes.                Urethra: not indicated              Vagina: normal appearing vagina with normal color and discharge, no lesions.  There are No abrasions or ulcerations.               Cervix: normal appearance Cervical lesions on the right that is red but not ulcerated.   Bimanual Exam:  Uterus:  not examined                               Adnexa:    not indicated                             Pessary was removed without difficulty without using forceps.  Pessary was cleansed with Betadine.  Pessary was replaced. Patient tolerated procedure well.    Assessment :  SUI, Urge incontinence, Cystocele- symptomatic   Use of pessary continued   Plan:    Return to office in 12 weeks.  An After Visit Summary was printed and given to the patient.

## 2016-10-18 ENCOUNTER — Ambulatory Visit (INDEPENDENT_AMBULATORY_CARE_PROVIDER_SITE_OTHER): Payer: Medicare Other | Admitting: Nurse Practitioner

## 2016-10-18 ENCOUNTER — Encounter: Payer: Self-pay | Admitting: Nurse Practitioner

## 2016-10-18 VITALS — BP 122/74 | HR 76 | Ht 59.0 in | Wt 104.0 lb

## 2016-10-18 DIAGNOSIS — Z4689 Encounter for fitting and adjustment of other specified devices: Secondary | ICD-10-CM

## 2016-10-18 DIAGNOSIS — N813 Complete uterovaginal prolapse: Secondary | ICD-10-CM | POA: Diagnosis not present

## 2016-10-18 NOTE — Patient Instructions (Signed)
Return in 1-2 weeks

## 2016-10-19 DIAGNOSIS — Z9181 History of falling: Secondary | ICD-10-CM | POA: Diagnosis not present

## 2016-10-19 DIAGNOSIS — G250 Essential tremor: Secondary | ICD-10-CM | POA: Diagnosis not present

## 2016-10-19 DIAGNOSIS — M4000 Postural kyphosis, site unspecified: Secondary | ICD-10-CM | POA: Diagnosis not present

## 2016-10-19 DIAGNOSIS — M19041 Primary osteoarthritis, right hand: Secondary | ICD-10-CM | POA: Diagnosis not present

## 2016-10-19 DIAGNOSIS — M19042 Primary osteoarthritis, left hand: Secondary | ICD-10-CM | POA: Diagnosis not present

## 2016-10-19 DIAGNOSIS — I10 Essential (primary) hypertension: Secondary | ICD-10-CM | POA: Diagnosis not present

## 2016-10-20 DIAGNOSIS — R600 Localized edema: Secondary | ICD-10-CM | POA: Diagnosis not present

## 2016-10-23 DIAGNOSIS — Z9181 History of falling: Secondary | ICD-10-CM | POA: Diagnosis not present

## 2016-10-23 DIAGNOSIS — M4000 Postural kyphosis, site unspecified: Secondary | ICD-10-CM | POA: Diagnosis not present

## 2016-10-23 DIAGNOSIS — G250 Essential tremor: Secondary | ICD-10-CM | POA: Diagnosis not present

## 2016-10-23 DIAGNOSIS — M19042 Primary osteoarthritis, left hand: Secondary | ICD-10-CM | POA: Diagnosis not present

## 2016-10-23 DIAGNOSIS — M19041 Primary osteoarthritis, right hand: Secondary | ICD-10-CM | POA: Diagnosis not present

## 2016-10-23 DIAGNOSIS — I10 Essential (primary) hypertension: Secondary | ICD-10-CM | POA: Diagnosis not present

## 2016-10-23 NOTE — Progress Notes (Signed)
Encounter reviewed by Dr. Brook Amundson C. Silva.  

## 2016-10-27 DIAGNOSIS — M19042 Primary osteoarthritis, left hand: Secondary | ICD-10-CM | POA: Diagnosis not present

## 2016-10-27 DIAGNOSIS — M19041 Primary osteoarthritis, right hand: Secondary | ICD-10-CM | POA: Diagnosis not present

## 2016-10-27 DIAGNOSIS — I10 Essential (primary) hypertension: Secondary | ICD-10-CM | POA: Diagnosis not present

## 2016-10-27 DIAGNOSIS — Z9181 History of falling: Secondary | ICD-10-CM | POA: Diagnosis not present

## 2016-10-27 DIAGNOSIS — G250 Essential tremor: Secondary | ICD-10-CM | POA: Diagnosis not present

## 2016-10-27 DIAGNOSIS — M4000 Postural kyphosis, site unspecified: Secondary | ICD-10-CM | POA: Diagnosis not present

## 2016-10-31 DIAGNOSIS — Z9181 History of falling: Secondary | ICD-10-CM | POA: Diagnosis not present

## 2016-10-31 DIAGNOSIS — G250 Essential tremor: Secondary | ICD-10-CM | POA: Diagnosis not present

## 2016-10-31 DIAGNOSIS — M19042 Primary osteoarthritis, left hand: Secondary | ICD-10-CM | POA: Diagnosis not present

## 2016-10-31 DIAGNOSIS — I10 Essential (primary) hypertension: Secondary | ICD-10-CM | POA: Diagnosis not present

## 2016-10-31 DIAGNOSIS — M4000 Postural kyphosis, site unspecified: Secondary | ICD-10-CM | POA: Diagnosis not present

## 2016-10-31 DIAGNOSIS — M19041 Primary osteoarthritis, right hand: Secondary | ICD-10-CM | POA: Diagnosis not present

## 2016-11-02 DIAGNOSIS — M0589 Other rheumatoid arthritis with rheumatoid factor of multiple sites: Secondary | ICD-10-CM | POA: Diagnosis not present

## 2016-11-03 DIAGNOSIS — I10 Essential (primary) hypertension: Secondary | ICD-10-CM | POA: Diagnosis not present

## 2016-11-03 DIAGNOSIS — G250 Essential tremor: Secondary | ICD-10-CM | POA: Diagnosis not present

## 2016-11-03 DIAGNOSIS — M19041 Primary osteoarthritis, right hand: Secondary | ICD-10-CM | POA: Diagnosis not present

## 2016-11-03 DIAGNOSIS — M4000 Postural kyphosis, site unspecified: Secondary | ICD-10-CM | POA: Diagnosis not present

## 2016-11-03 DIAGNOSIS — M19042 Primary osteoarthritis, left hand: Secondary | ICD-10-CM | POA: Diagnosis not present

## 2016-11-03 DIAGNOSIS — Z9181 History of falling: Secondary | ICD-10-CM | POA: Diagnosis not present

## 2016-11-08 ENCOUNTER — Encounter: Payer: Self-pay | Admitting: Neurology

## 2016-11-08 ENCOUNTER — Ambulatory Visit (INDEPENDENT_AMBULATORY_CARE_PROVIDER_SITE_OTHER): Payer: Medicare Other | Admitting: Neurology

## 2016-11-08 VITALS — BP 148/81 | HR 71 | Wt 105.2 lb

## 2016-11-08 DIAGNOSIS — I6990 Unspecified sequelae of unspecified cerebrovascular disease: Secondary | ICD-10-CM

## 2016-11-08 NOTE — Patient Instructions (Signed)
I had a long discussion with the patient and her daughter regarding her remote stroke and recommend she continue aspirin 81 mg daily for stroke prevention and maintain strict control of hypertension with blood pressure goal below 130/90 and lipids with LDL cholesterol goal below 70 mg percent. Continue Depakote in the current dose of 250 mg twice daily for seizure prevention. I encouraged her to use a walker at all times and fall and safety precautions. She will return for follow-up in the future only if necessary and no routine scheduled appointment was made

## 2016-11-08 NOTE — Progress Notes (Signed)
[facility]    Provider:  Dr Lucia Gaskins Referring Provider: [name], MD Primary Care Physician:  Pamelia Hoit, MD  CC:  Stroke  HPI:  Dr Lucia Gaskins 02/11/2015 :  Anna Mccoy is a 83 y.o. female here as a referral from Dr. Andrey Campanile for follow up of stroke.  She has a PMHx of LBP, lumbar spinal stenosis s/p surgery 2010, right foot numbness, HTN, hypothyroidism, RA who presented to ED on 01/22/2015 complaining of left sided temporal and maxillary stabbing and lancinating pain. She was on Plavix daily and aspirin 81 mg every 3 days. Neurologic exam nonfocal. MRI of the brain demonstrated areas of acute infarction in the left posterior frontal and parietal cortex areas. Also showed small left occipital subdural hemorrhage. She was admitted to [facility] for stroke workup. The headache was acute in onset and be going on for about a week. Symptoms were described as stabbing and intermittent without photophobia, phonophobia, nausea, vomiting, visual changes, or autonomic features.  She is here with her 2 daughters who provide most of the information. She feels she is doing well. Not back to her baseline but doing better. She is taking care of herself, daughter taking good care of her and she is at peace. She has no had a headache since leaving the hospital. She sometimes has some difficulty explaining things, she gets off track but no confusional spells. She may ask the same questions twice such as about medicine but patient says she just wants to make sure she takes it correctly. She lives wither two daughters and her sister that is 12. No more seizures or seizure like episodes. No aphasia. Her vision is not as clear as it was prior to the stroke. She had cataract removal in May and improved but worsened after the stroke. She notes that this morning she was able to read better and she was able to read fine print and she is very happy about that. She had PT yesterday and she is  doing very well, and she is going to start occupational Therapy. She is taking the Depakote and is not having any side effects. She is taking aspirin every 3 days (should be taking every day). She is still on the Plavix.   Per daughters: They got home this past Friday and she is having coordination issues, reaching out for objects and thinks she is grabbing them but isn't. Her vision is blurry since she has been home. Vision is worsening, she can't recognize people in pictures on the mantle because she can't see them clearly anymore. She is more forgetful since being home. Patient is asking the same questions. She is getting more things mixed up but not altered in any way. Daughters are worried she has had a repeat stroke, the symptoms started happening after the last MRI of the brain.  Reviewed notes, labs and imaging from outside physicians, which showed: She presented to the ED on 01/22/2015 complaining of left sided temporal and maxillary stabbing and lancinating pain. She was on Plavix daily and aspirin 81 mg every 3 days. Neurologic exam nonfocal. MRI of the brain demonstrated areas of acute infarction in the left posterior frontal and parietal cortex areas. Also showed left occipital subdural hemorrhage unclear if it was secondary to a fall 1 week prior versus meningeal thickening secondary to rheumatoid arthritis. The left posterior frontal and parietal multiple small infarcts were thought to be secondary to small vessel disease versus rheumatoid vasculitis(personally reviewed images). MRA was unremarkable.  She was admitted to [facility] for stroke workup. While inpatient, patient had a seizure with right leg and hand shaking and expressive aphasia. She was started on Depakote with good results for both the seizures and the headache.  LDL was 120, statin was started hgba1c 5.5 Cbc with mild anemia Bmp with slightlky elevated gfr  EEG: This is an abnormal electroencephalogram secondary to left  temporal slowing and rare left temporal sharp transients. This is consistent with the patient's history of left hemispheric acute infarcts seen on MRI.   MRI w/contrast: IMPRESSION: Smooth dural thickening and enhancement overlying the left cerebral hemisphere as above. This is felt to most likely be reactive in nature due to the presence of the left subdural collection as identified on previous MRI. The previously identified left occipital collection itself does not enhance, and most likely reflects a small subacute subdural hemorrhage related to recent fall/ trauma.  MRI HEAD FINDINGS WO CONTRAST  Subcentimeter areas of acute infarction affect the LEFT posterior frontal and parietal cortex near the vertex.  No hemorrhage, mass lesion, hydrocephalus, or extra-axial fluid. Generalized atrophy. Moderately advanced chronic microvascular ischemic change. Flow voids are preserved. No midline abnormality. No osseous findings. Extracranial soft tissues unremarkable. Moderate retention cyst formation RIGHT maxillary sinus. Negative orbits.  Compared with prior CT, the infarcts are not visible.  MRA HEAD FINDINGS  Dolichoectatic but widely patent internal carotid arteries. Basilar artery widely patent with both vertebrals contributing. New no proximal stenosis of the anterior, middle, or posterior cerebral arteries. No MCA branch occlusion. No intracranial aneurysm.  IMPRESSION: Subcentimeter foci of restricted diffusion in the LEFT posterior frontal and parietal cortex consistent with multiple small infarcts. These are nonhemorrhagic. See discussion above.  Atrophy and small vessel disease.  No proximal flow reducing lesion is evident.  Echo: The cavity size was normal. Systolic function was normal. The estimated ejection fraction was in the range of 55% to 60%. Wall motion was normal; there were no regional wall motion abnormalities.  Carotids: Findings  suggest 1-39% internal carotid artery stenosis bilaterally. Vertebral arteries are patent with antegrade flow. Questionable history of atrial fibrillation Irregular heartbeat per patient, none appreciated on exam or monitoring. Not on anticoagulation prior to admission  Update 05/12/2015 : She returns for follow-up after last visit 3 months ago with Dr. Daisy Blossom. She is doing well without recurrent seizures or strokelike episodes. She is tolerating Depakote well without significant side effects except possible minor and tremors which are not functionally disabling. She remains on aspirin 81 mg is tolerating well with out significant bleeding or bruising. Her blood pressure is usually well controlled though it is slightly elevated at 154/67 in office today. She had follow-up MRI scan of the brain done on 03/04/15 which I personally reviewed shows changes of small vessel disease and mild atrophy. The previously noted small subcutaneous is not as well appreciated. Patient is overall doing well and has had no new complaints. Update 11/11/2015 : She returns for follow-up after last visit 6 months ago. She continues to do well without significant recurrent strokes or TIAs. She is tolerating aspirin well without bleeding or bruising. She states her blood pressure is well controlled and today it is 132/65. She is tolerating Lipitor well without myalgias or arthralgias. She is complaining of swelling in her feet and she doesn't know the reason. She has an upcoming visit with her primary physician and advise her to discuss possibly stopping the Norvasc which may be contributing. Update 11/08/2016 : She returns  for follow-up after last visit a year ago. She is accompanied by her daughter. She has had no recurrent stroke or TIA symptoms or any seizures. She is tolerating aspirin well without bleeding or bruising. She states her blood pressure is usually better controlled and today it is slightly elevated at 140/81 in office.  She remains on Lipitor which is tolerating well without side effects. She cannot tell me when last lipid profile was checked. She continues to live at home with her 2 daughters. She had some blurred vision in May of last year and has been seeing her eye doctor. They have tried several different eyedrops which has not had. She likely has progressive macular degeneration. Patient has been independent in activities of daily living. She's had no further injuries. Her memory seems quite good strength but occasionally she gets confused. Review of Systems: Patient complains of symptoms per HPI as well as the following symptoms:   Blurred vision, palpitations, cold intolerance, constipation, diarrhea, incontinence of bowels, daytime sleepiness, frequent urination, bladder urgency, walking difficulty, tremors, leg swelling,  and all other systems negative.   Social History   Social History  . Marital status: Widowed    Spouse name: N/A  . Number of children: 5  . Years of education: 12   Occupational History  . Retired     Social History Main Topics  . Smoking status: Never Smoker  . Smokeless tobacco: Never Used  . Alcohol use No  . Drug use: No  . Sexual activity: No     Comment: Widowed 01/10/07 age 60   Other Topics Concern  . Not on file   Social History Narrative   Patient is right handed, resides with daughter and sister.    Caffeine use: Coffee (drinks 1/2-1 cup per day)    Family History  Problem Relation Age of Onset  . Hypertension Mother   . Stroke Mother   . Hypertension Sister   . Diabetes Sister   . Hypertension Sister   . Diabetes Sister     Past Medical History:  Diagnosis Date  . Anemia   . Arthritis   . DDD (degenerative disc disease), lumbar   . Edema   . Hx-TIA (transient ischemic attack)   . Hypertension   . Hypothyroidism    HYPOTHYROIDISM  . Menopausal state age 10  . Mitral valve regurgitation    TRIVIAL  . Post-menopausal bleeding 02/2011   Endo  Biopsy 7/12 benign, no hyperplasia  . Procidentia of uterus    uses pessary  . PVC's (premature ventricular contractions)   . RBBB   . Rheumatoid arthritis(714.0)   . Stroke (HCC)   . Vitamin D deficiency disease     Past Surgical History:  Procedure Laterality Date  . CHOLECYSTECTOMY, LAPAROSCOPIC  2002  . COLONOSCOPY W/ BIOPSIES  6/09   sigmoid diverticuli recheck 5 years  . EYE SURGERY  04/24/14   Cataract sx in right eye with lens replacement.  Marland Kitchen LAMINECTOMY AND MICRODISCECTOMY LUMBAR SPINE  01/23/09  . VAGINAL DELIVERY     x5    Current Outpatient Prescriptions  Medication Sig Dispense Refill  . abatacept (ORENCIA) 250 MG injection Inject into the vein every 30 (thirty) days.     Marland Kitchen acetaminophen (TYLENOL) 500 MG tablet Take 500 mg by mouth as needed for mild pain.     Marland Kitchen amLODipine (NORVASC) 2.5 MG tablet Take 1 tablet by mouth daily.    Marland Kitchen aspirin 81 MG tablet Take 81 mg by  mouth daily.     Marland Kitchen atenolol (TENORMIN) 50 MG tablet Take 50 mg by mouth daily.    Marland Kitchen atorvastatin (LIPITOR) 40 MG tablet Take 1 tablet (40 mg total) by mouth daily at 6 PM. 30 tablet 0  . conjugated estrogens (PREMARIN) vaginal cream Place 1 Applicatorful vaginally 2 (two) times a week.    . divalproex (DEPAKOTE) 250 MG DR tablet Take 1 tablet (250 mg total) by mouth every 12 (twelve) hours. 60 tablet 0  . fluorometholone (FML) 0.1 % ophthalmic suspension Place 1 drop into both eyes 4 (four) times daily.    . folic acid (FOLVITE) 1 MG tablet Take 1 mg by mouth daily.    . furosemide (LASIX) 20 MG tablet   5  . HYDROcodone-acetaminophen (NORCO/VICODIN) 5-325 MG tablet     . leflunomide (ARAVA) 20 MG tablet Take 20 mg by mouth every other day.    . levothyroxine (SYNTHROID, LEVOTHROID) 75 MCG tablet Take 75 mcg by mouth daily before breakfast.    . megestrol (MEGACE) 400 MG/10ML suspension Take 400 mg by mouth daily.    . mirtazapine (REMERON) 15 MG tablet Take 15 mg by mouth at bedtime.    . Multiple  Vitamins-Minerals (OCUVITE PRESERVISION PO) Take 1 tablet by mouth 2 (two) times daily.     Marland Kitchen POLY-IRON 150 150 MG capsule Take 150 mg by mouth daily.   0  . Polyethyl Glycol-Propyl Glycol (SYSTANE) 0.4-0.3 % SOLN Place 1 drop into both eyes 3 (three) times daily.     . potassium chloride SA (K-DUR,KLOR-CON) 20 MEQ tablet Take 20 mEq by mouth 2 (two) times daily.    . primidone (MYSOLINE) 50 MG tablet Take 1 tablet by mouth at bedtime.  5  . valsartan-hydrochlorothiazide (DIOVAN HCT) 160-25 MG tablet Take 1 tablet by mouth daily.    . Vitamin D, Ergocalciferol, (DRISDOL) 50000 UNITS CAPS Take 50,000 Units by mouth every 14 (fourteen) days.     No current facility-administered medications for this visit.     Allergies as of 11/08/2016 - Review Complete 11/08/2016  Allergen Reaction Noted  . Ciprofloxacin Diarrhea 06/09/2013  . Lidocaine  11/10/2011  . Neosporin [neomycin-bacitracin zn-polymyx]  12/03/2012  . Sulfa antibiotics  11/10/2011    Vitals: BP (!) 148/81 (BP Location: Right Arm, Patient Position: Sitting, Cuff Size: Small)   Pulse 71   Wt 105 lb 3.2 oz (47.7 kg)   LMP 08/14/1981 (Approximate)   BMI 21.25 kg/m  Last Weight:  Wt Readings from Last 1 Encounters:  11/08/16 105 lb 3.2 oz (47.7 kg)   Last Height:   Ht Readings from Last 1 Encounters:  10/18/16 4\' 11"  (1.499 m)   Physical exam: Exam: Gen: Frail petite Caucasian lady. She has marked kyphosis, conversant, well nourised, well groomed                     CV: RRR, no MRG. No Carotid Bruits. No peripheral edema, warm, nontender Eyes: Conjunctivae clear without exudates or hemorrhage  Neuro: Detailed Neurologic Exam  Speech:    Speech is normal; fluent and spontaneous with normal comprehension.  Cognition:    The patient is oriented to person, place, and time;     recent and remote memory appear  intact;     language fluent;     normal attention, concentration,     fund of knowledge appears intact Cranial  Nerves:    The pupils are equal, round, and reactive to light. The fundi  are flat. Visual fields are full to finger confrontation. Extraocular movements are intact. Trigeminal sensation is intact and the muscles of mastication are normal. The face is symmetric. The palate elevates in the midline. Hearing intact. Voice is normal. Shoulder shrug is normal. The tongue has normal motion without fasciculations.   Coordination:    No dysmetria   Gait:   Uses a walker, no ataxia  Motor Observation:    No asymmetry, no atrophy, and no involuntary movements noted. Tone:    Normal muscle tone.    Posture:    Posture is slightly stooped    Strength:    Strength is V/V in the upper and lower limbs.      Sensation: intact to LT     Reflex Exam:  DTR's:    Deep tendon reflexes in the upper and lower extremities are symmetrical bilaterally.   Toes:    The toes are downgoing bilaterally.   Gait cautious and stroke and uses a walker    Assessment/Plan:  Anna Mccoy is a 83 y.o. female with history of hypertension, hypothyroidism and   small left occipital subdural hemorrhage, left posterior frontal and parietal multiple small vessel disease lacunar infarcts. Had episode of right-sided motor seizure and aphasia.     I had a long discussion with the patient and her daughter regarding her remote stroke and recommend she continue aspirin 81 mg daily for stroke prevention and maintain strict control of hypertension with blood pressure goal below 130/90 and lipids with LDL cholesterol goal below 70 mg percent. Continue Depakote in the current dose of 250 mg twice daily for seizure prevention. I encouraged her to use a walker at all times and fall and safety precautions. Greater than 50% time during this 25 minute visit was spent on counseling and coordination of care about her risk for strokes, seizures and answering questions She will return for follow-up in the future only if necessary and  no routine scheduled appointment was made Delia Heady, MD  [facility] [address] [place], [place] [address]  Phone [phone] Fax [phone]

## 2016-11-30 DIAGNOSIS — M0589 Other rheumatoid arthritis with rheumatoid factor of multiple sites: Secondary | ICD-10-CM | POA: Diagnosis not present

## 2016-12-04 ENCOUNTER — Other Ambulatory Visit: Payer: Self-pay | Admitting: Nurse Practitioner

## 2016-12-04 NOTE — Telephone Encounter (Signed)
Medication refill request: Conjugated Estrogens Last AEX:  10/18/16 PG (pessary maintenance Next AEX: 01/10/17 PG Last MMG (if hormonal medication request): 12/16/10 [id number], [facility] Refill authorized: 04/06/15 #[id number] 3R. Please advise. Thank you.

## 2016-12-05 DIAGNOSIS — H40013 Open angle with borderline findings, low risk, bilateral: Secondary | ICD-10-CM | POA: Diagnosis not present

## 2016-12-05 DIAGNOSIS — H26492 Other secondary cataract, left eye: Secondary | ICD-10-CM | POA: Diagnosis not present

## 2016-12-05 DIAGNOSIS — H35033 Hypertensive retinopathy, bilateral: Secondary | ICD-10-CM | POA: Diagnosis not present

## 2016-12-05 DIAGNOSIS — H04123 Dry eye syndrome of bilateral lacrimal glands: Secondary | ICD-10-CM | POA: Diagnosis not present

## 2016-12-05 DIAGNOSIS — H26491 Other secondary cataract, right eye: Secondary | ICD-10-CM | POA: Diagnosis not present

## 2016-12-05 DIAGNOSIS — H353130 Nonexudative age-related macular degeneration, bilateral, stage unspecified: Secondary | ICD-10-CM | POA: Diagnosis not present

## 2016-12-28 DIAGNOSIS — M0589 Other rheumatoid arthritis with rheumatoid factor of multiple sites: Secondary | ICD-10-CM | POA: Diagnosis not present

## 2016-12-31 IMAGING — MR MR MRA HEAD W/O CM
9 of 11 series · 30 of 48 positions shown · non-contrast
Comparison: CT head 01/31/15.

ADDENDUM:
In the LEFT occipital region, there is a 3 mm thick isointense fluid
collection on FLAIR imaging, trace susceptibility on gradient
sequence, not visible on CT, suggesting a small subacute subdural
hematoma or hygroma. This is remote from the small acute infarcts.
No history of trauma or anticoagulation. Significance uncertain as
the patient's headache is reportedly maxillary. Findings discussed
with Dr. Ricardo Ignacio at time of addendum.
CLINICAL DATA: Patient presents to the emergency department with
headache that sound going for the last week. The patient describes a
headache that is intermittent in nature and lasts for a short period
of time is mainly left-sided, radiates into her ear. Patient
describes it as a sharp sensation. She denies having any visual
changes, nausea, vomiting, weakness, dizziness, neck pain, chest
pain, shortness of breath, fever, lightheadedness or syncope.
Patient states nothing seems make her condition better or worse. The
patient has not taken any medications at home for her headache. The
patient was seen in the emergency department 2 nights ago. The
daughter was insistent that she come back to the emergency
department for further evaluation. History of hypertension. History
of cardiac arrhythmia

EXAM:
MRI HEAD WITHOUT CONTRAST
MRA HEAD WITHOUT CONTRAST a are
TECHNIQUE: Multiplanar, multiecho pulse sequences of the brain and surrounding
structures were obtained without intravenous contrast. Angiographic
images of the head were obtained using MRA technique without
contrast.

[Series 4: DWI · axial · 3.0mm · 1.09mm/px · z∈[-38,+98]mm · 7 of 96 slices shown (1 of 4)]
[im 1/96]
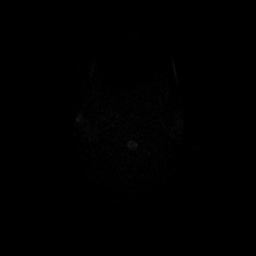
[im 16/96]
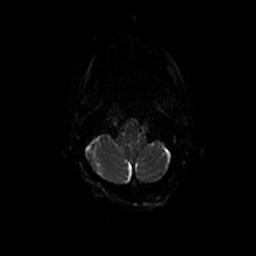
[im 32/96]
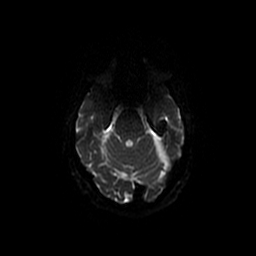
[im 48/96]
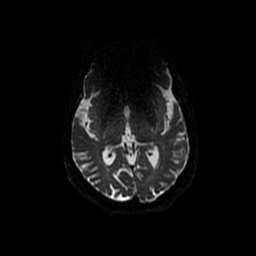
[im 64/96]
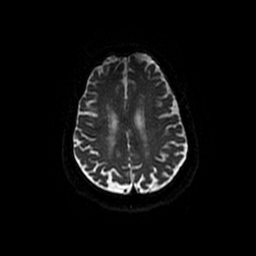
[im 80/96]
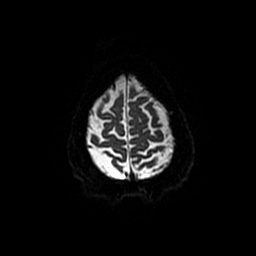
[im 96/96]
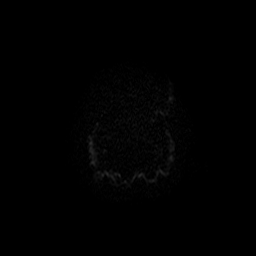

[Series 5: (id) mt fs · axial · 1.2mm · 0.43mm/px · z∈[-37,+4]mm · 4 of 174 slices shown]
[im 1/174]
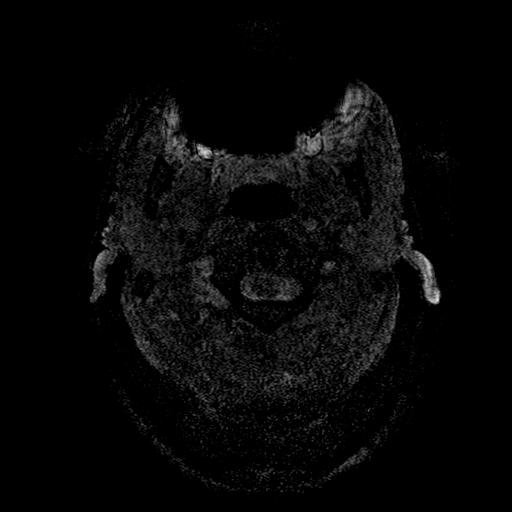
[im 29/174]
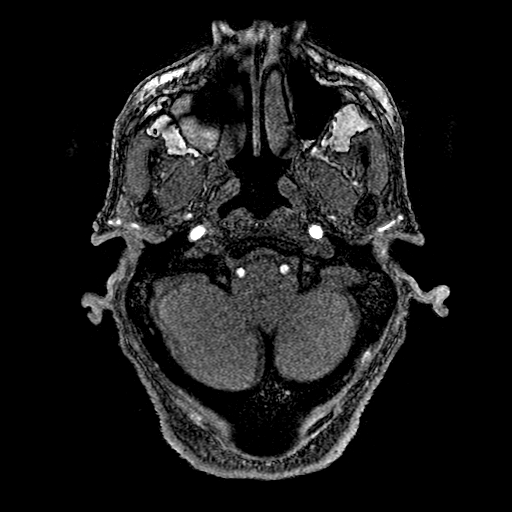
[im 58/174]
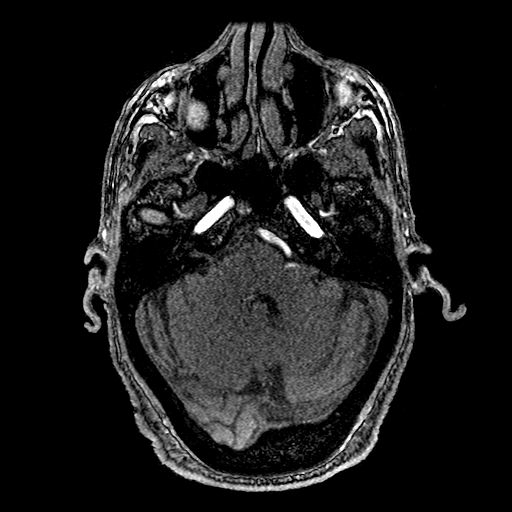
[im 73/174]
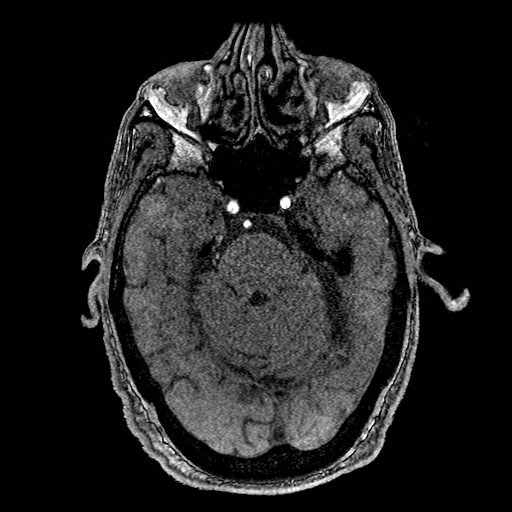

[Series 6: T2 · axial · 5.0mm · 0.43mm/px · z∈[-35,+96]mm · 2 of 24 slices shown (1 of 2)]
[im 1/24]
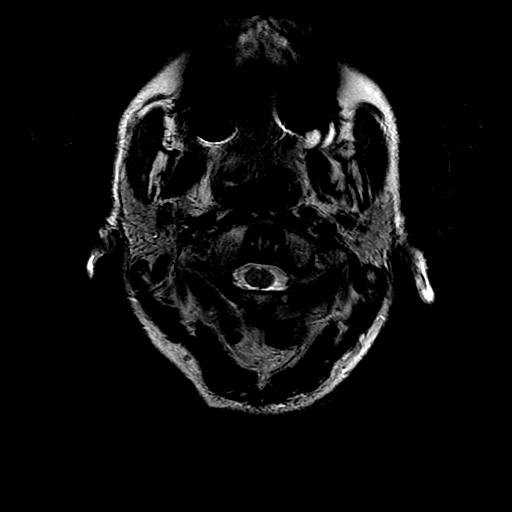
[im 24/24]
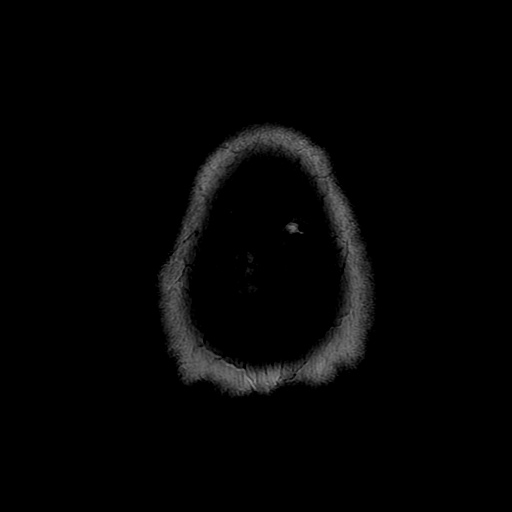

[Series 7: FLAIR · axial · 5.0mm · 0.43mm/px · z∈[-35,+96]mm · 2 of 24 slices shown]
[im 1/24]
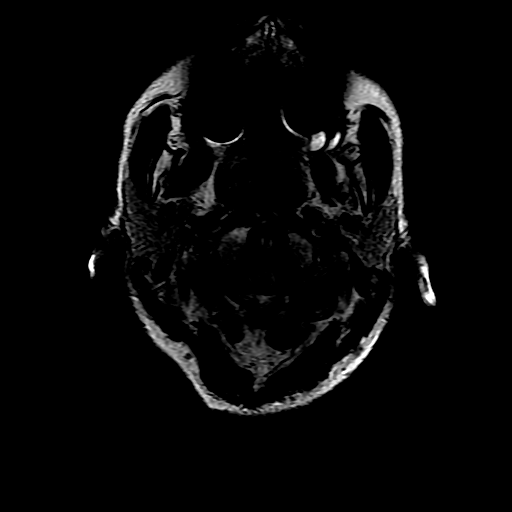
[im 24/24]
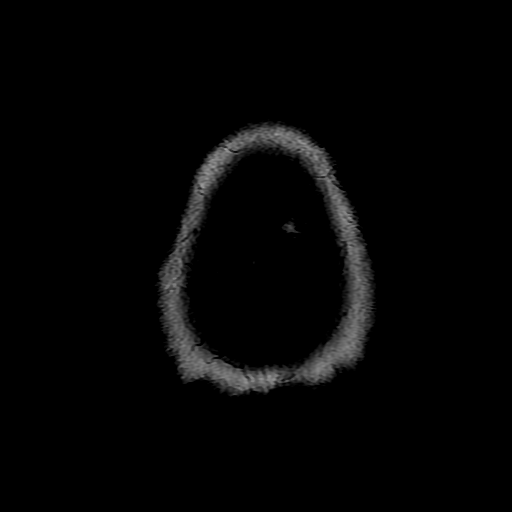

[Series 10: DWI · coronal · 5.0mm · 1.09mm/px · 5 of 66 slices shown (2 of 4)]
[im 1/66]
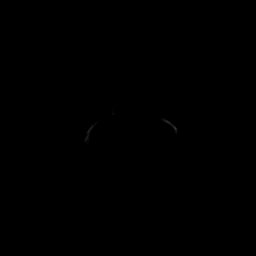
[im 17/66]
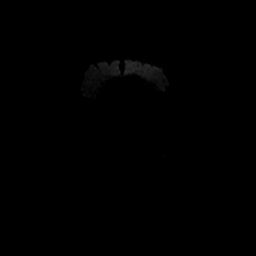
[im 33/66]
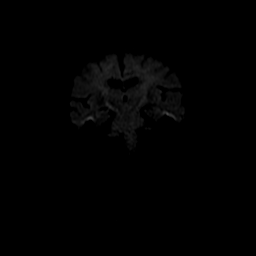
[im 49/66]
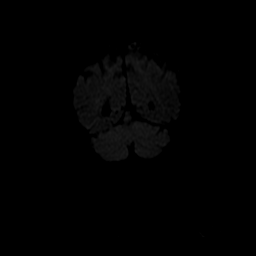
[im 66/66]
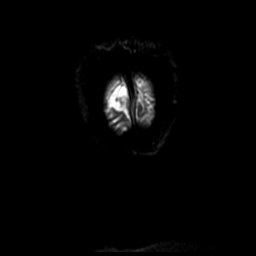

[Series 11: T2 · coronal · 5.0mm · 0.39mm/px · 2 of 28 slices shown (2 of 2)]
[im 1/28]
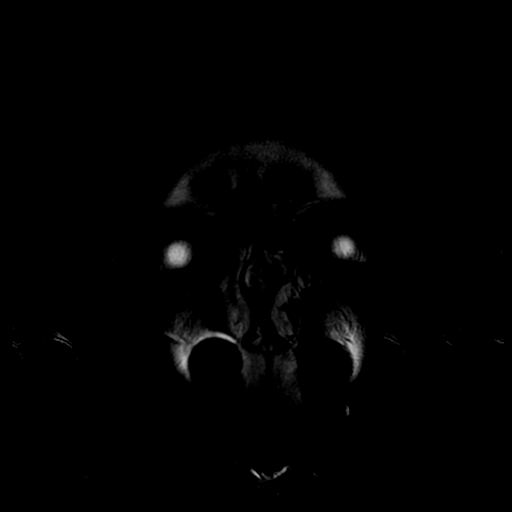
[im 28/28]
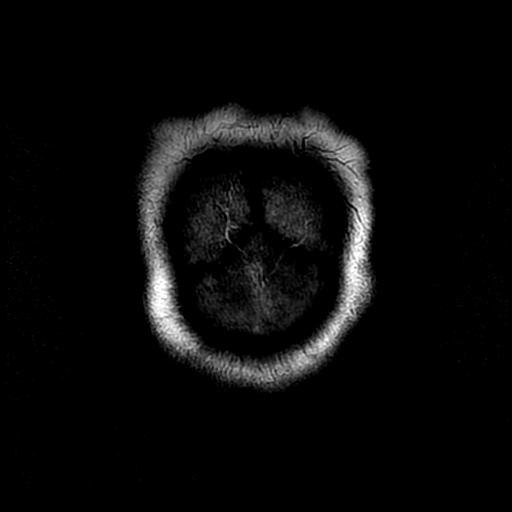

[Series 12: T1 · sagittal · 5.0mm · 0.47mm/px · 2 of 23 slices shown]
[im 1/23]
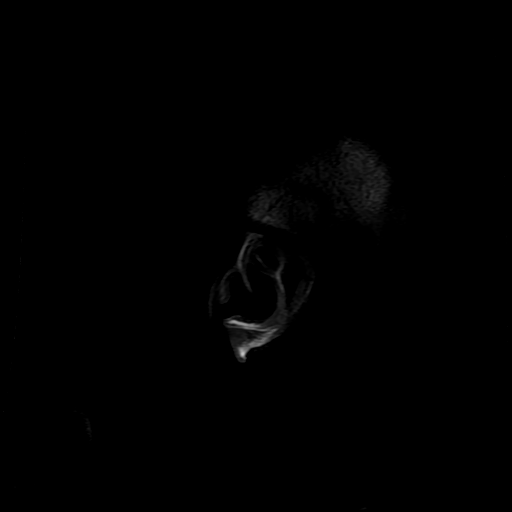
[im 23/23]
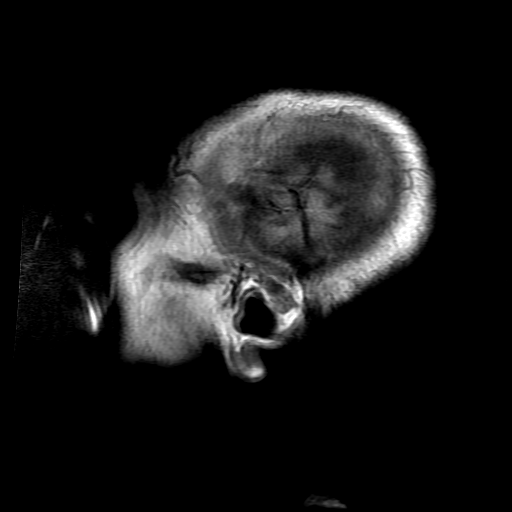

[Series 400: DWI · axial · 3.0mm · 1.09mm/px · z∈[-38,+98]mm · 4 of 48 slices shown (3 of 4)]
[im 1/48]
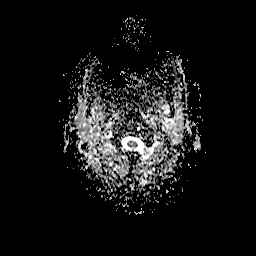
[im 16/48]
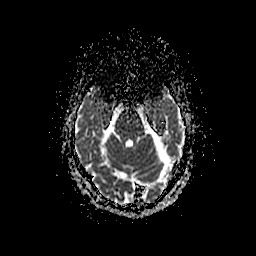
[im 32/48]
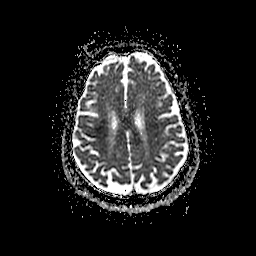
[im 48/48]
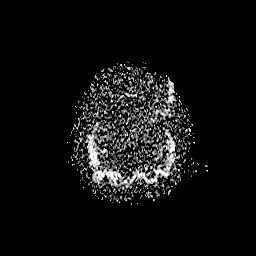

[Series 1000: DWI · coronal · 5.0mm · 1.09mm/px · 2 of 33 slices shown (4 of 4)]
[im 1/33]
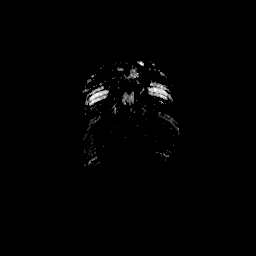
[im 33/33]
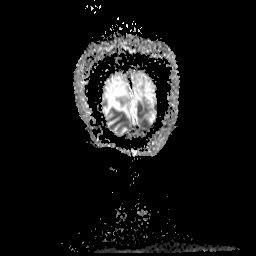

[30 of 48 positions shown; findings below may reference images not displayed]

FINDINGS: MRI HEAD FINDINGS

Subcentimeter areas of acute infarction affect the LEFT posterior
frontal and parietal cortex near the vertex.

No hemorrhage, mass lesion, hydrocephalus, or extra-axial fluid.
Generalized atrophy. Moderately advanced chronic microvascular
ischemic change. Flow voids are preserved. No midline abnormality.
No osseous findings. Extracranial soft tissues unremarkable.
Moderate retention cyst formation RIGHT maxillary sinus. Negative
orbits.

Compared with prior CT, the infarcts are not visible.

MRA HEAD FINDINGS

Dolichoectatic but widely patent internal carotid arteries. Basilar
artery widely patent with both vertebrals contributing. New no
proximal stenosis of the anterior, middle, or posterior cerebral
arteries. No MCA branch occlusion. No intracranial aneurysm.
IMPRESSION: Subcentimeter foci of restricted diffusion in the LEFT posterior
frontal and parietal cortex consistent with multiple small infarcts.
These are nonhemorrhagic. See discussion above.

Atrophy and small vessel disease.

No proximal flow reducing lesion is evident.

## 2017-01-09 ENCOUNTER — Ambulatory Visit (INDEPENDENT_AMBULATORY_CARE_PROVIDER_SITE_OTHER): Payer: Medicare Other | Admitting: Nurse Practitioner

## 2017-01-09 ENCOUNTER — Encounter: Payer: Self-pay | Admitting: Nurse Practitioner

## 2017-01-09 VITALS — BP 112/70 | HR 60 | Ht 59.0 in | Wt 102.0 lb

## 2017-01-09 DIAGNOSIS — Z4689 Encounter for fitting and adjustment of other specified devices: Secondary | ICD-10-CM

## 2017-01-09 NOTE — Progress Notes (Signed)
Subjective:   83 y.o. Widowed Black female [id number] here for pessary check.  Patient has been using following pessary style and size:  2 3/4" ring pessary with support.  She describes the following issues with the pessary: none She needs to have eye surgery in the near future but feels well otherwise - she is not sure if this is for macular degeneration.  Use of protective clothing such as Depends or pads Yes.   Problems with protective clothing with rash No.  Constipation issues with use of pessary No.  She is not sexually active.    Compliant to use of vaginal cream Yes.    ROS:   no breast pain or new or enlarging lumps on self exam,  no abnormal bleeding, pelvic pain or discharge,   no dysuria, trouble voiding or hematuria.   General Exam:    BP 112/70 (BP Location: Right Arm, Patient Position: Sitting, Cuff Size: Normal)   Pulse 60   Ht 4\' 11"  (1.499 m)   Wt 102 lb (46.3 kg)   LMP 08/14/1981 (Approximate)   BMI 20.60 kg/m   General appearance: alert, cooperative, appears stated age and no distress  Pelvic: External genitalia:  no lesions   Before pessary was removed No. prolapse over the pessary   In correct position Yes.                Urethra: normal appearing urethra with no masses, tenderness or lesions              Vagina: normal appearing vagina with normal color and discharge, no lesions.  There are No abrasions or ulcerations.               Cervix: normal appearance Cervical lesions were not found   Bimanual Exam:  Uterus:  not examined                               Adnexa:    not indicated                             Pessary was removed without difficulty without using forceps.  Pessary was cleansed with Betadine.  Pessary was replaced. Patient tolerated procedure well.    Assessment :    SUI, Urge incontinence, Cystocele- symptomatic, Rectocele- symptomatic    Use of pessary continued   Plan:    Return to office in 12 weeks for recheck.         if any problems  in the interim to call  An After Visit Summary was printed and given to the patient.

## 2017-01-09 NOTE — Patient Instructions (Addendum)
Recheck in 1-2 weeks

## 2017-01-10 ENCOUNTER — Ambulatory Visit: Payer: Medicare Other | Admitting: Nurse Practitioner

## 2017-01-10 NOTE — Progress Notes (Signed)
Reviewed personally.  M. Suzanne Paydon Carll, MD.  

## 2017-01-15 DIAGNOSIS — H26491 Other secondary cataract, right eye: Secondary | ICD-10-CM | POA: Diagnosis not present

## 2017-01-25 DIAGNOSIS — M0589 Other rheumatoid arthritis with rheumatoid factor of multiple sites: Secondary | ICD-10-CM | POA: Diagnosis not present

## 2017-02-16 DIAGNOSIS — R54 Age-related physical debility: Secondary | ICD-10-CM | POA: Diagnosis not present

## 2017-02-16 DIAGNOSIS — G250 Essential tremor: Secondary | ICD-10-CM | POA: Diagnosis not present

## 2017-02-16 DIAGNOSIS — M06042 Rheumatoid arthritis without rheumatoid factor, left hand: Secondary | ICD-10-CM | POA: Diagnosis not present

## 2017-02-16 DIAGNOSIS — M06041 Rheumatoid arthritis without rheumatoid factor, right hand: Secondary | ICD-10-CM | POA: Diagnosis not present

## 2017-02-16 DIAGNOSIS — M4000 Postural kyphosis, site unspecified: Secondary | ICD-10-CM | POA: Diagnosis not present

## 2017-02-16 DIAGNOSIS — E039 Hypothyroidism, unspecified: Secondary | ICD-10-CM | POA: Diagnosis not present

## 2017-02-16 DIAGNOSIS — R600 Localized edema: Secondary | ICD-10-CM | POA: Diagnosis not present

## 2017-02-16 DIAGNOSIS — I10 Essential (primary) hypertension: Secondary | ICD-10-CM | POA: Diagnosis not present

## 2017-02-22 DIAGNOSIS — M0589 Other rheumatoid arthritis with rheumatoid factor of multiple sites: Secondary | ICD-10-CM | POA: Diagnosis not present

## 2017-02-26 ENCOUNTER — Telehealth: Payer: Self-pay | Admitting: *Deleted

## 2017-02-26 NOTE — Telephone Encounter (Signed)
Message left on home number voicemail to return call. Advised message was regarding upcoming appointment.

## 2017-02-26 NOTE — Telephone Encounter (Signed)
-----   Message from Sharma Covert sent at 02/26/2017  1:44 PM EDT ----- Regarding: Please call Ms. Evalyn Shultis to get her rescheduled Judeth Cornfield,  Please call Ms. Marylan Glore and get her rescheduled from 04/03/17. She is on Patty's schedule that day and will need to see someone else going forward.  Thanks for your help!  Mervin Kung

## 2017-02-27 ENCOUNTER — Other Ambulatory Visit: Payer: Self-pay | Admitting: Cardiology

## 2017-02-27 DIAGNOSIS — I10 Essential (primary) hypertension: Secondary | ICD-10-CM

## 2017-02-27 DIAGNOSIS — R600 Localized edema: Secondary | ICD-10-CM

## 2017-03-02 DIAGNOSIS — L309 Dermatitis, unspecified: Secondary | ICD-10-CM | POA: Diagnosis not present

## 2017-03-15 DIAGNOSIS — Z800 Family history of malignant neoplasm of digestive organs: Secondary | ICD-10-CM | POA: Diagnosis not present

## 2017-03-15 DIAGNOSIS — R194 Change in bowel habit: Secondary | ICD-10-CM | POA: Diagnosis not present

## 2017-03-15 DIAGNOSIS — R140 Abdominal distension (gaseous): Secondary | ICD-10-CM | POA: Diagnosis not present

## 2017-03-15 DIAGNOSIS — K5730 Diverticulosis of large intestine without perforation or abscess without bleeding: Secondary | ICD-10-CM | POA: Diagnosis not present

## 2017-03-20 NOTE — Telephone Encounter (Signed)
appointment rescheduled to Lovett Sox, CNM schedule on same day.

## 2017-03-26 DIAGNOSIS — M0589 Other rheumatoid arthritis with rheumatoid factor of multiple sites: Secondary | ICD-10-CM | POA: Diagnosis not present

## 2017-04-03 ENCOUNTER — Ambulatory Visit: Payer: Medicare Other

## 2017-04-03 ENCOUNTER — Ambulatory Visit (INDEPENDENT_AMBULATORY_CARE_PROVIDER_SITE_OTHER): Payer: Medicare Other | Admitting: Certified Nurse Midwife

## 2017-04-03 ENCOUNTER — Ambulatory Visit: Payer: Medicare Other | Admitting: Nurse Practitioner

## 2017-04-03 ENCOUNTER — Encounter: Payer: Self-pay | Admitting: Certified Nurse Midwife

## 2017-04-03 VITALS — BP 120/80 | Ht 59.0 in | Wt 107.0 lb

## 2017-04-03 DIAGNOSIS — N8110 Cystocele, unspecified: Secondary | ICD-10-CM

## 2017-04-03 DIAGNOSIS — Z4689 Encounter for fitting and adjustment of other specified devices: Secondary | ICD-10-CM

## 2017-04-03 NOTE — Patient Instructions (Signed)
Call if has vaginal bleeding or problems with pessary.

## 2017-04-03 NOTE — Progress Notes (Addendum)
Subjective:   83 y.o. Widowed Black female [id number] here for pessary check.  Patient has been using following pessary style and size:  # 4 ring pessary with support.  She describes the following issues with the pessary:  None. Patient using Premarin cream with pessary twice weekly with no problems. Continues to have some leaking, only when using diuretic in am. Denis vaginal bleeding or dryness..   Use of protective clothing such as Depends or pads Yes.  . Problems with protective clothing with rash No..  Constipation issues with use of pessary No..  She is not sexually active.     ROS:    no abnormal bleeding, pelvic pain or discharge,   no dysuria, trouble voiding or hematuria. Compliant to use of vaginal cream Yes.   General Exam:    LMP 08/14/1981 (Approximate)   General appearance: alert, cooperative, appears stated age and no distress   Pelvic: External genitalia:  no lesions and atrophic, normal female appearance   Before pessary was removed No. prolapse over the pessary   In correct position Yes.                Urethra: normal appearing urethra with no masses, tenderness or lesions              Vagina: atrophic appearing vagina with normal color and discharge, no lesions. Grade 1 cystocele noted.  There are No abrasions or ulcerations.               Cervix: normal appearance Cervical lesions were not found   Bimanual Exam:  Uterus:  normal size, contour, position, consistency, mobility, non-tender                                Adnexa:    normal adnexa in size, nontender and no masses                             Pessary was removed without difficulty without using forceps.  Pessary was cleansed with Betadine.  Pessary was replaced. Patient tolerated procedure well.    Assessment :  Normal exam, Cystocele- symptomatic        Atrophic vaginitis with Premarin use   Use of pessary continued   Plan:       Discussed normal pelvic findings and no contraindications to continued  pessary use. Warning signs reviewed and need to advise if occurs or has vaginal bleeding. Patient voiced understanding. Patient having no issues with Premarin insertion. Has daughter living with her for assistance as needed. Ambulates with walker easily. Discussed last breast exam and patient not sure, declines offer to do today. Return to office in 3 months for recheck or prn.           An After Visit Summary was printed and given to the patient.

## 2017-04-25 DIAGNOSIS — M0589 Other rheumatoid arthritis with rheumatoid factor of multiple sites: Secondary | ICD-10-CM | POA: Diagnosis not present

## 2017-04-25 DIAGNOSIS — R5383 Other fatigue: Secondary | ICD-10-CM | POA: Diagnosis not present

## 2017-05-04 ENCOUNTER — Ambulatory Visit (INDEPENDENT_AMBULATORY_CARE_PROVIDER_SITE_OTHER): Payer: Medicare Other | Admitting: Cardiology

## 2017-05-04 ENCOUNTER — Encounter: Payer: Self-pay | Admitting: Cardiology

## 2017-05-04 VITALS — BP 146/82 | HR 77 | Ht 59.0 in | Wt 109.0 lb

## 2017-05-04 DIAGNOSIS — I5033 Acute on chronic diastolic (congestive) heart failure: Secondary | ICD-10-CM

## 2017-05-04 DIAGNOSIS — I110 Hypertensive heart disease with heart failure: Secondary | ICD-10-CM | POA: Diagnosis not present

## 2017-05-04 DIAGNOSIS — I10 Essential (primary) hypertension: Secondary | ICD-10-CM | POA: Diagnosis not present

## 2017-05-04 DIAGNOSIS — E782 Mixed hyperlipidemia: Secondary | ICD-10-CM | POA: Diagnosis not present

## 2017-05-04 LAB — BASIC METABOLIC PANEL
BUN/Creatinine Ratio: 15 (ref 12–28)
BUN: 10 mg/dL (ref 10–36)
CO2: 26 mmol/L (ref 20–29)
Calcium: 9 mg/dL (ref 8.7–10.3)
Chloride: 99 mmol/L (ref 96–106)
Creatinine, Ser: 0.68 mg/dL (ref 0.57–1.00)
GFR calc Af Amer: 89 mL/min/{1.73_m2} (ref >59)
GFR calc non Af Amer: 77 mL/min/{1.73_m2} (ref >59)
Glucose: 65 mg/dL (ref 65–99)
Potassium: 3.7 mmol/L (ref 3.5–5.2)
Sodium: 141 mmol/L (ref 134–144)

## 2017-05-04 LAB — PRO B NATRIURETIC PEPTIDE: NT-Pro BNP: 257 pg/mL (ref 0–738)

## 2017-05-04 MED ORDER — ATORVASTATIN CALCIUM 20 MG PO TABS: 20.0000 mg | ORAL_TABLET | Freq: Every day | ORAL | 3 refills | Status: DC

## 2017-05-04 NOTE — Addendum Note (Signed)
Addended by: Micki Riley C on: 05/04/2017 01:24 PM   Modules accepted: Orders

## 2017-05-04 NOTE — Progress Notes (Signed)
Cardiology Office Note    Date:  05/04/2017   ID:  Anna Mccoy, DOB January 21, 1925, MRN [id number]  PCP and Referring Physician:  [name], MD  Cardiologist:   Tobias Alexander, MD   Chief complain: 1 year follow up  History of Present Illness:  Anna Mccoy is a 83 y.o. female with prior medical history of hypertension, stroke in June 2016 who has noticed worsening shortness of breath and lower extremity edema for the last couple of months. The patient is still active walks around with a cane and has noticed to get short of breath just walking around her house. Year ago when she had her stroke she had an echocardiogram that showed normal LV function with no regional wall motion abnormalities, her diastolic function and wall thickness was not described. She denies any chest pain palpitations or syncope. She has had long-term problems with high blood pressure difficult to manage medically. She denies any orthopnea or paroxysmal nocturnal dyspnea. She has never smoked.  05/04/2017 - 1 year follow-up, patient states that her symptoms have been about the same she wears compression stockings above her thighs, but takes them off at night. She has really good appetite and her weight went up by 2 pounds since last year. Her swelling has been stable, slightly worse on the left than right LE. She uses Lasix. Potassium supplements. Her valsartan was switched to losartan because of recall. She denies any chest pain, shortness of breath, also denies any falls but states that she stumbles at time.  Past Medical History:  Diagnosis Date  . Anemia   . Arthritis   . DDD (degenerative disc disease), lumbar   . Edema   . Hx-TIA (transient ischemic attack)   . Hypertension   . Hypothyroidism    HYPOTHYROIDISM  . Menopausal state age 59  . Mitral valve regurgitation    TRIVIAL  . Post-menopausal bleeding 02/2011   Endo Biopsy 7/12 benign, no hyperplasia  . Procidentia of uterus    uses  pessary  . PVC's (premature ventricular contractions)   . RBBB   . Rheumatoid arthritis(714.0)   . Stroke (HCC)   . Vitamin D deficiency disease     Past Surgical History:  Procedure Laterality Date  . CHOLECYSTECTOMY, LAPAROSCOPIC  2002  . COLONOSCOPY W/ BIOPSIES  6/09   sigmoid diverticuli recheck 5 years  . EYE SURGERY  04/24/14   Cataract sx in right eye with lens replacement.  Marland Kitchen LAMINECTOMY AND MICRODISCECTOMY LUMBAR SPINE  01/23/09  . VAGINAL DELIVERY     x5    Current Medications: Outpatient Medications Prior to Visit  Medication Sig Dispense Refill  . abatacept (ORENCIA) 250 MG injection Inject into the vein every 30 (thirty) days.     Marland Kitchen acetaminophen (TYLENOL) 500 MG tablet Take 500 mg by mouth as needed for mild pain.     Marland Kitchen amLODipine (NORVASC) 5 MG tablet Take 1 tablet by mouth daily.  5  . aspirin 81 MG tablet Take 81 mg by mouth daily.     Marland Kitchen atenolol (TENORMIN) 50 MG tablet Take 50 mg by mouth daily.    Marland Kitchen conjugated estrogens (PREMARIN) vaginal cream Place 1 Applicatorful vaginally 2 (two) times a week.    . divalproex (DEPAKOTE) 250 MG DR tablet Take 1 tablet (250 mg total) by mouth every 12 (twelve) hours. 60 tablet 0  . fluorometholone (FML) 0.1 % ophthalmic suspension Place 1 drop into both eyes 4 (four) times daily.    Marland Kitchen  folic acid (FOLVITE) 1 MG tablet Take 1 mg by mouth daily.    . furosemide (LASIX) 20 MG tablet   5  . HYDROcodone-acetaminophen (NORCO/VICODIN) 5-325 MG tablet     . leflunomide (ARAVA) 20 MG tablet Take 20 mg by mouth every other day.    . levothyroxine (SYNTHROID, LEVOTHROID) 75 MCG tablet Take 75 mcg by mouth daily before breakfast.    . losartan-hydrochlorothiazide (HYZAAR) 100-25 MG tablet     . megestrol (MEGACE) 400 MG/10ML suspension Take 400 mg by mouth daily.    . mirtazapine (REMERON) 15 MG tablet Take 15 mg by mouth at bedtime.    . Multiple Vitamins-Minerals (OCUVITE PRESERVISION PO) Take 1 tablet by mouth 2 (two) times daily.       Marland Kitchen POLY-IRON 150 150 MG capsule Take 150 mg by mouth daily.   0  . Polyethyl Glycol-Propyl Glycol (SYSTANE) 0.4-0.3 % SOLN Place 1 drop into both eyes 3 (three) times daily.     . potassium chloride SA (K-DUR,KLOR-CON) 20 MEQ tablet Take 20 mEq by mouth 2 (two) times daily.    Marland Kitchen PREMARIN vaginal cream APPLY VAGINALLY 3 TIMES A WEEK 30 g 2  . primidone (MYSOLINE) 50 MG tablet Take 1 tablet by mouth at bedtime.  5  . Vitamin D, Ergocalciferol, (DRISDOL) 50000 UNITS CAPS Take 50,000 Units by mouth every 14 (fourteen) days.    Marland Kitchen atorvastatin (LIPITOR) 40 MG tablet Take 1 tablet (40 mg total) by mouth daily at 6 PM. 30 tablet 0  . valsartan (DIOVAN) 320 MG tablet TAKE 1 TABLET(320 MG) BY MOUTH DAILY 30 tablet 1   No facility-administered medications prior to visit.      Allergies:   Ciprofloxacin; Lidocaine; Neosporin [neomycin-bacitracin zn-polymyx]; and Sulfa antibiotics   Social History   Social History  . Marital status: Widowed    Spouse name: N/A  . Number of children: 5  . Years of education: 12   Occupational History  . Retired     Social History Main Topics  . Smoking status: Never Smoker  . Smokeless tobacco: Never Used  . Alcohol use No  . Drug use: No  . Sexual activity: No     Comment: Widowed 01/10/07 age 71   Other Topics Concern  . None   Social History Narrative   Patient is right handed, resides with daughter and sister.    Caffeine use: Coffee (drinks 1/2-1 cup per day)    Family History:  The patient's family history includes Diabetes in her sister and sister; Hypertension in her mother, sister, and sister; Stroke in her mother.   ROS:   Please see the history of present illness.    ROS All other systems reviewed and are negative.  PHYSICAL EXAM:   VS:  BP (!) 146/82   Pulse 77   Ht 4\' 11"  (1.499 m)   Wt 109 lb (49.4 kg)   LMP 08/14/1981 (Approximate)   BMI 22.02 kg/m    GEN: Well nourished, well developed, in no acute distress  HEENT: normal   Neck: no JVD, carotid bruits, or masses Cardiac: RRR; 2/6 systolic murmur, rubs, or gallops, bilateral 1+ pitting edema to her knees.  Respiratory:  clear to auscultation bilaterally, normal work of breathing GI: soft, nontender, nondistended, + BS MS: no deformity or atrophy  Skin: warm and dry, no rash Neuro:  Alert and Oriented x 3, Strength and sensation are intact Psych: euthymic mood, full affect  Wt Readings from Last 3 Encounters:  05/04/17 109 lb (49.4 kg)  04/03/17 107 lb (48.5 kg)  01/09/17 102 lb (46.3 kg)      Studies/Labs Reviewed:   EKG:  EKG from 12/20/15 showed SR, RBBB Recent Labs: No results found for requested labs within last 8760 hours.   Lipid Panel    Component Value Date/Time   CHOL 197 02/03/2015 0705   TRIG 61 02/03/2015 0705   HDL 65 02/03/2015 0705   CHOLHDL 3.0 02/03/2015 0705   VLDL 12 02/03/2015 0705   LDLCALC 120 (H) 02/03/2015 0705    Additional studies/ records that were reviewed today include:   TTE: 01/2016 - Left ventricle: The cavity size was normal. Wall thickness was   normal. Systolic function was normal. The estimated ejection   fraction was in the range of 55% to 60%. Left ventricular   diastolic function parameters were normal. - Aorta: Calcification of the STJ. - Mitral valve: There was mild regurgitation. - Right atrium: Possible right sided cor triatriatum not clinically   significant. - Atrial septum: No defect or patent foramen ovale was identified. - Pulmonary arteries: PA peak pressure: 31 mm Hg (S).  EKG performed today 05/04/2017 showed normal sinus rhythm normal EKG unchanged from prior.   ASSESSMENT:    1. Acute on chronic diastolic CHF (congestive heart failure), NYHA class 2 (HCC)   2. Essential hypertension   3. Hypertensive heart disease with heart failure (HCC)      PLAN:  In order of problems listed above:  1. The patient appears to have symptoms of diastolic heart failure, She has residual mild  persistent lower extremity edema that actually improved from last year even though her weight is slightly off.  2. We'll check BMP and BNP today and unless abnormal BNP we'll continue the same dose of furosemide. 3. Hypertension - well controlled and appropriate for age I wouldn't change her blood pressure medication and she has balance issues. I will decrease her Lipitor from 40-20 with mild memory impairment.  Medication Adjustments/Labs and Tests Ordered: Current medicines are reviewed at length with the patient today.  Concerns regarding medicines are outlined above.  Medication changes, Labs and Tests ordered today are listed in the Patient Instructions below. Patient Instructions  Medication Instructions:   DECREASE YOUR ATORVASTATIN TO 20 MG ONCE DAILY    Labwork:  TODAY--BMET AND PRO-BNP      Follow-Up:  Your physician wants you to follow-up in: 6 MONTHS WITH DR Johnell Comings will receive a reminder letter in the mail two months in advance. If you don't receive a letter, please call our office to schedule the follow-up appointment.        If you need a refill on your cardiac medications before your next appointment, please call your pharmacy.      Signed, Tobias Alexander, MD  05/04/2017 11:58 AM    [facility] [address] [place], [place], [place]  [address] Phone: [phone]; Fax: [phone]

## 2017-05-04 NOTE — Patient Instructions (Signed)
Medication Instructions:   DECREASE YOUR ATORVASTATIN TO 20 MG ONCE DAILY    Labwork:  TODAY--BMET AND PRO-BNP      Follow-Up:  Your physician wants you to follow-up in: 6 MONTHS WITH DR Johnell Comings will receive a reminder letter in the mail two months in advance. If you don't receive a letter, please call our office to schedule the follow-up appointment.        If you need a refill on your cardiac medications before your next appointment, please call your pharmacy.

## 2017-05-24 DIAGNOSIS — H04123 Dry eye syndrome of bilateral lacrimal glands: Secondary | ICD-10-CM | POA: Diagnosis not present

## 2017-05-24 DIAGNOSIS — H26492 Other secondary cataract, left eye: Secondary | ICD-10-CM | POA: Diagnosis not present

## 2017-05-24 DIAGNOSIS — M3501 Sicca syndrome with keratoconjunctivitis: Secondary | ICD-10-CM | POA: Diagnosis not present

## 2017-06-06 DIAGNOSIS — R269 Unspecified abnormalities of gait and mobility: Secondary | ICD-10-CM

## 2017-06-06 HISTORY — DX: Unspecified abnormalities of gait and mobility: R26.9

## 2017-06-07 DIAGNOSIS — M0589 Other rheumatoid arthritis with rheumatoid factor of multiple sites: Secondary | ICD-10-CM | POA: Diagnosis not present

## 2017-06-18 DIAGNOSIS — Z23 Encounter for immunization: Secondary | ICD-10-CM | POA: Diagnosis not present

## 2017-06-25 ENCOUNTER — Telehealth: Payer: Self-pay | Admitting: Certified Nurse Midwife

## 2017-06-25 NOTE — Telephone Encounter (Signed)
Patient canceled her upcoming 3 month recheck appointment 06/27/17 via the automated reminder call. I left a message for her to call and reschedule.

## 2017-06-27 ENCOUNTER — Ambulatory Visit: Payer: Medicare Other | Admitting: Certified Nurse Midwife

## 2017-06-27 ENCOUNTER — Ambulatory Visit (INDEPENDENT_AMBULATORY_CARE_PROVIDER_SITE_OTHER): Payer: Medicare Other | Admitting: Certified Nurse Midwife

## 2017-06-27 ENCOUNTER — Other Ambulatory Visit: Payer: Self-pay

## 2017-06-27 ENCOUNTER — Encounter: Payer: Self-pay | Admitting: Certified Nurse Midwife

## 2017-06-27 VITALS — BP 124/80 | HR 68 | Resp 16 | Ht 59.0 in | Wt 107.0 lb

## 2017-06-27 DIAGNOSIS — N812 Incomplete uterovaginal prolapse: Secondary | ICD-10-CM

## 2017-06-27 DIAGNOSIS — Z4689 Encounter for fitting and adjustment of other specified devices: Secondary | ICD-10-CM

## 2017-06-27 NOTE — Patient Instructions (Signed)
Call if vaginal bleeding or pain or unable to empty bladder or bowels.  Return in 3 months

## 2017-06-27 NOTE — Progress Notes (Addendum)
Subjective:   83 y.o. Widowed Black female [id number] here for pessary check.  Patient has been using following pessary style and size:  # 4 ring pessary with support..  She describes the following issues with the pessary:  none.   Use of protective clothing such as Depends or pads Yes.  . Problems with protective clothing with rash No..  Constipation issues with use of pessary No..  She is not sexually active.     ROS:   no vaginal bleeding, no discharge or pelvic pain, no dysuria, trouble voiding or hematuria. Compliant to use of vaginal cream Yes.   General Exam:    LMP 08/14/1981 (Approximate)   General appearance: alert, cooperative, appears stated age and no distress   Pelvic: External genitalia:  no lesions, atrophic appearance and no scaling   Before pessary was removed No. prolapse over the pessary   In correct position Yes.                Urethra: not indicated and normal appearing urethra with no masses, tenderness or lesions              Vagina: normal appearing vagina with normal color and discharge, no lesions.  There are No abrasions or ulcerations.               Cervix: normal appearance and normal appearance no ulcerations or excoriations Cervical lesions were not found   Bimanual Exam:  Uterus:  normal size, contour, position, consistency, mobility, non-tender                                Adnexa:    normal adnexa in size, nontender and no masses and no masses                             Pessary was removed without difficulty without using forceps.  Pessary was cleansed with Betadine.  Pessary was replaced. Patient tolerated procedure well.    Assessment :  Normal exam, Cystocele- symptomatic        Pessary working well for support, desires continuance   Premarin use for Atrophic vaginitis working well   Use of pessary continued, no contraindications   Plan:    Discussed findings of normal exam and no problems with continued use. Warning signs reviewed. Questions  answered.  Patient has Premarin cream. Will need to advise if problems with emptying bowel or bladder while pessary or vaginal bleeding.. Voiced understanding.  Rv 3  Months, prn          An After Visit Summary was printed and given to the patient.

## 2017-07-04 DIAGNOSIS — I6789 Other cerebrovascular disease: Secondary | ICD-10-CM | POA: Diagnosis not present

## 2017-07-04 DIAGNOSIS — E039 Hypothyroidism, unspecified: Secondary | ICD-10-CM | POA: Diagnosis not present

## 2017-07-04 DIAGNOSIS — I63039 Cerebral infarction due to thrombosis of unspecified carotid artery: Secondary | ICD-10-CM | POA: Diagnosis not present

## 2017-07-04 DIAGNOSIS — G250 Essential tremor: Secondary | ICD-10-CM | POA: Diagnosis not present

## 2017-07-04 DIAGNOSIS — I10 Essential (primary) hypertension: Secondary | ICD-10-CM | POA: Diagnosis not present

## 2017-07-09 DIAGNOSIS — M0589 Other rheumatoid arthritis with rheumatoid factor of multiple sites: Secondary | ICD-10-CM | POA: Diagnosis not present

## 2017-07-10 DIAGNOSIS — I10 Essential (primary) hypertension: Secondary | ICD-10-CM | POA: Diagnosis not present

## 2017-07-10 DIAGNOSIS — E785 Hyperlipidemia, unspecified: Secondary | ICD-10-CM | POA: Diagnosis not present

## 2017-07-10 DIAGNOSIS — E039 Hypothyroidism, unspecified: Secondary | ICD-10-CM | POA: Diagnosis not present

## 2017-07-30 DIAGNOSIS — H40053 Ocular hypertension, bilateral: Secondary | ICD-10-CM | POA: Diagnosis not present

## 2017-07-30 DIAGNOSIS — H04123 Dry eye syndrome of bilateral lacrimal glands: Secondary | ICD-10-CM | POA: Diagnosis not present

## 2017-07-30 DIAGNOSIS — M3501 Sicca syndrome with keratoconjunctivitis: Secondary | ICD-10-CM | POA: Diagnosis not present

## 2017-07-30 DIAGNOSIS — H40043 Steroid responder, bilateral: Secondary | ICD-10-CM | POA: Diagnosis not present

## 2017-08-13 DIAGNOSIS — M0589 Other rheumatoid arthritis with rheumatoid factor of multiple sites: Secondary | ICD-10-CM | POA: Diagnosis not present

## 2017-08-17 ENCOUNTER — Encounter (HOSPITAL_COMMUNITY): Payer: Self-pay

## 2017-08-17 DIAGNOSIS — N3000 Acute cystitis without hematuria: Secondary | ICD-10-CM | POA: Diagnosis not present

## 2017-08-17 DIAGNOSIS — I5033 Acute on chronic diastolic (congestive) heart failure: Secondary | ICD-10-CM | POA: Diagnosis not present

## 2017-08-17 DIAGNOSIS — Z7982 Long term (current) use of aspirin: Secondary | ICD-10-CM | POA: Diagnosis not present

## 2017-08-17 DIAGNOSIS — Z8673 Personal history of transient ischemic attack (TIA), and cerebral infarction without residual deficits: Secondary | ICD-10-CM | POA: Diagnosis not present

## 2017-08-17 DIAGNOSIS — E039 Hypothyroidism, unspecified: Secondary | ICD-10-CM | POA: Insufficient documentation

## 2017-08-17 DIAGNOSIS — R030 Elevated blood-pressure reading, without diagnosis of hypertension: Secondary | ICD-10-CM | POA: Diagnosis not present

## 2017-08-17 DIAGNOSIS — I10 Essential (primary) hypertension: Secondary | ICD-10-CM | POA: Diagnosis not present

## 2017-08-17 DIAGNOSIS — I110 Hypertensive heart disease with heart failure: Secondary | ICD-10-CM | POA: Insufficient documentation

## 2017-08-17 DIAGNOSIS — Z79899 Other long term (current) drug therapy: Secondary | ICD-10-CM | POA: Diagnosis not present

## 2017-08-17 DIAGNOSIS — R079 Chest pain, unspecified: Secondary | ICD-10-CM | POA: Diagnosis not present

## 2017-08-17 NOTE — ED Triage Notes (Signed)
Pt comes via [facility] for HTN, no complaints. PCP ordered new medication two days ago, controlled during the day and raising at night.

## 2017-08-18 ENCOUNTER — Emergency Department (HOSPITAL_COMMUNITY)
Admission: EM | Admit: 2017-08-18 | Discharge: 2017-08-18 | Disposition: A | Discharge status: Home / Self Care | Payer: Medicare Other | Attending: Physician Assistant | Admitting: Physician Assistant

## 2017-08-18 ENCOUNTER — Other Ambulatory Visit: Payer: Self-pay

## 2017-08-18 ENCOUNTER — Emergency Department (HOSPITAL_COMMUNITY): Payer: Medicare Other

## 2017-08-18 DIAGNOSIS — R079 Chest pain, unspecified: Secondary | ICD-10-CM | POA: Diagnosis not present

## 2017-08-18 DIAGNOSIS — I110 Hypertensive heart disease with heart failure: Secondary | ICD-10-CM | POA: Diagnosis not present

## 2017-08-18 DIAGNOSIS — N3000 Acute cystitis without hematuria: Secondary | ICD-10-CM

## 2017-08-18 LAB — COMPREHENSIVE METABOLIC PANEL
ALK PHOS: 61 U/L (ref 38–126)
ALT: 16 U/L (ref 14–54)
AST: 22 U/L (ref 15–41)
Albumin: 3.8 g/dL (ref 3.5–5.0)
Anion gap: 10 (ref 5–15)
BUN: 14 mg/dL (ref 6–20)
CALCIUM: 9.4 mg/dL (ref 8.9–10.3)
CO2: 28 mmol/L (ref 22–32)
CREATININE: 0.68 mg/dL (ref 0.44–1.00)
Chloride: 97 mmol/L — ABNORMAL LOW (ref 101–111)
GFR calc non Af Amer: >60 mL/min (ref >60)
GLUCOSE: 85 mg/dL (ref 65–99)
Potassium: 4.2 mmol/L (ref 3.5–5.1)
SODIUM: 135 mmol/L (ref 135–145)
Total Bilirubin: 0.7 mg/dL (ref 0.3–1.2)
Total Protein: 7.4 g/dL (ref 6.5–8.1)

## 2017-08-18 LAB — CBC WITH DIFFERENTIAL/PLATELET
BASOS PCT: 0 %
Basophils Absolute: 0 10*3/uL (ref 0.0–0.1)
EOS ABS: 0.3 10*3/uL (ref 0.0–0.7)
EOS PCT: 4 %
HCT: 42.1 % (ref 36.0–46.0)
Hemoglobin: 13.6 g/dL (ref 12.0–15.0)
LYMPHS ABS: 3.3 10*3/uL (ref 0.7–4.0)
Lymphocytes Relative: 47 %
MCH: 30.9 pg (ref 26.0–34.0)
MCHC: 32.3 g/dL (ref 30.0–36.0)
MCV: 95.7 fL (ref 78.0–100.0)
MONO ABS: 0.9 10*3/uL (ref 0.1–1.0)
MONOS PCT: 12 %
NEUTROS PCT: 37 %
Neutro Abs: 2.7 10*3/uL (ref 1.7–7.7)
PLATELETS: 244 10*3/uL (ref 150–400)
RBC: 4.4 MIL/uL (ref 3.87–5.11)
RDW: 14.4 % (ref 11.5–15.5)
WBC: 7.2 10*3/uL (ref 4.0–10.5)

## 2017-08-18 LAB — URINALYSIS, ROUTINE W REFLEX MICROSCOPIC
BILIRUBIN URINE: NEGATIVE
Glucose, UA: NEGATIVE mg/dL
Hgb urine dipstick: NEGATIVE
Ketones, ur: NEGATIVE mg/dL
Nitrite: NEGATIVE
PH: 7 (ref 5.0–8.0)
Protein, ur: NEGATIVE mg/dL
SPECIFIC GRAVITY, URINE: 1.008 (ref 1.005–1.030)

## 2017-08-18 LAB — I-STAT TROPONIN, ED: TROPONIN I, POC: 0 ng/mL (ref 0.00–0.08)

## 2017-08-18 MED ORDER — CEPHALEXIN 250 MG PO CAPS
500.0000 mg | ORAL_CAPSULE | Freq: Once | ORAL | Status: AC
  Administered 2017-08-18: 500 mg via ORAL
  Filled 2017-08-18: qty 2

## 2017-08-18 MED ORDER — CEPHALEXIN 500 MG PO CAPS: 500.0000 mg | ORAL_CAPSULE | Freq: Four times a day (QID) | ORAL | 0 refills | Status: DC

## 2017-08-18 NOTE — ED Notes (Signed)
ED Provider at bedside. 

## 2017-08-18 NOTE — ED Provider Notes (Signed)
[facility] EMERGENCY DEPARTMENT Provider Note   CSN: [id number] Arrival date & time: 08/17/17  2226     History   Chief Complaint Chief Complaint  Patient presents with  . Hypertension    HPI Anna Mccoy is a 84 y.o. female.  HPI  Well-appearing 84 year old female presenting with asymptomatic hypertension.  Patient had been noted to have hypertension earlier this week.  Seen by primary care provider and started on carvedilol 6.25 mg.  Daughter is been taking blood pressure at home and it was noted to be high again tonight.  EMS was called and brought her here to the emergency department.  Patient has no headache.  She reports mild fatigue, no chest pain.  Urinary complaints.  No nausea no vomiting.  No visual complaints no weakness  Past medical history significant for stroke, arthritis, hypertension, hypothyroid, TIA and mitral valve.  Past Medical History:  Diagnosis Date  . Anemia   . Arthritis   . DDD (degenerative disc disease), lumbar   . Edema   . Hx-TIA (transient ischemic attack)   . Hypertension   . Hypothyroidism    HYPOTHYROIDISM  . Menopausal state age 36  . Mitral valve regurgitation    TRIVIAL  . Post-menopausal bleeding 02/2011   Endo Biopsy 7/12 benign, no hyperplasia  . Procidentia of uterus    uses pessary  . PVC's (premature ventricular contractions)   . RBBB   . Rheumatoid arthritis(714.0)   . Stroke (HCC)   . Vitamin D deficiency disease     Patient Active Problem List   Diagnosis Date Noted  . Gait disturbance 06/06/2017  . Frailty 10/07/2016  . Kyphosis (acquired) (postural) 10/07/2016  . Bilateral leg edema 01/13/2016  . Hypertensive heart disease with heart failure (HCC) 01/13/2016  . Acute on chronic diastolic CHF (congestive heart failure), NYHA class 2 (HCC) 01/13/2016  . Blurred vision, bilateral 12/31/2015  . Bilateral dry eyes 12/31/2015  . Chronic fatigue 12/15/2015  . Chronic diarrhea of unknown  origin 12/04/2015  . Essential tremor 09/29/2015  . Aphthous ulcer 09/28/2015  . Cardiac arrhythmia 09/28/2015  . Cataract 09/28/2015  . 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 History of headache 09/28/2015  . Hypertrophic toenail 09/28/2015  . Hypokalemia 09/28/2015  . Osteoporosis 09/28/2015  . Peripheral neuropathy 09/28/2015  . Seborrheic dermatitis 09/28/2015  . Situational anxiety 09/28/2015  . Vitamin D deficiency 09/28/2015  . Weight loss 09/28/2015  . UTI (lower urinary tract infection)   . Seizure cerebral   . CVA (cerebral infarction) 02/02/2015  . Stroke (HCC) 02/02/2015  . Rheumatoid arthritis (HCC) 02/02/2015  . Headache 02/02/2015  . Hypertension 02/02/2015  . Hypothyroidism 02/02/2015  . Cerebral infarction due to unspecified mechanism   . Uterovaginal prolapse, complete 04/18/2013    Past Surgical History:  Procedure Laterality Date  . CHOLECYSTECTOMY, LAPAROSCOPIC  2002  . COLONOSCOPY W/ BIOPSIES  6/09   sigmoid diverticuli recheck 5 years  . EYE SURGERY  04/24/14   Cataract sx in right eye with lens replacement.  Marland Kitchen LAMINECTOMY AND MICRODISCECTOMY LUMBAR SPINE  01/23/09  . VAGINAL DELIVERY     x5    OB History    Gravida Para Term Preterm AB Living   5 5 5  0 0 5   SAB TAB Ectopic Multiple Live Births   0 0 0 0 5       Home Medications    Prior to Admission medications   Medication Sig Start Date  End Date Taking? Authorizing Provider  abatacept (ORENCIA) 250 MG injection Inject into the vein every 30 (thirty) days.     [provider]  acetaminophen (TYLENOL) 500 MG tablet Take 500 mg by mouth as needed for mild pain.     [provider]  amLODipine (NORVASC) 5 MG tablet Take 1 tablet by mouth daily. 11/23/16   [provider]  aspirin 81 MG tablet Take 81 mg by mouth daily.     [provider]  atenolol (TENORMIN) 50 MG tablet Take 50 mg by mouth daily.    [provider]  atorvastatin  (LIPITOR) 20 MG tablet Take 1 tablet (20 mg total) by mouth daily. 05/04/17 08/02/17  Lars Masson, MD  conjugated estrogens (PREMARIN) vaginal cream Place 1 Applicatorful vaginally 2 (two) times a week.    [provider]  divalproex (DEPAKOTE) 250 MG DR tablet Take 1 tablet (250 mg total) by mouth every 12 (twelve) hours. 02/05/15   Rhetta Mura, MD  fluorometholone (FML) 0.1 % ophthalmic suspension Place 1 drop into both eyes 4 (four) times daily. 10/13/16   [provider]  folic acid (FOLVITE) 1 MG tablet Take 1 mg by mouth daily.    [provider]  furosemide (LASIX) 20 MG tablet  10/20/16   [provider]  HYDROcodone-acetaminophen (NORCO/VICODIN) 5-325 MG tablet  08/22/16   [provider]  leflunomide (ARAVA) 20 MG tablet Take 20 mg by mouth every other day.    [provider]  levothyroxine (SYNTHROID, LEVOTHROID) 75 MCG tablet Take 75 mcg by mouth daily before breakfast.    [provider]  losartan-hydrochlorothiazide (HYZAAR) 100-25 MG tablet  03/16/17   [provider]  megestrol (MEGACE) 400 MG/10ML suspension Take 400 mg by mouth daily.    [provider]  mirtazapine (REMERON) 15 MG tablet Take 15 mg by mouth at bedtime.    [provider]  Multiple Vitamins-Minerals (OCUVITE PRESERVISION PO) Take 1 tablet by mouth 2 (two) times daily.     [provider]  POLY-IRON 150 150 MG capsule Take 150 mg by mouth daily.  10/13/15   [provider]  Polyethyl Glycol-Propyl Glycol (SYSTANE) 0.4-0.3 % SOLN Place 1 drop into both eyes 3 (three) times daily.     [provider]  potassium chloride SA (K-DUR,KLOR-CON) 20 MEQ tablet Take 20 mEq by mouth 2 (two) times daily.    [provider]  PREMARIN vaginal cream APPLY VAGINALLY 3 TIMES A WEEK 12/04/16   Ria Comment, FNP  primidone (MYSOLINE) 50 MG tablet Take 1 tablet by mouth at bedtime. 09/20/16   [provider]  valsartan-hydrochlorothiazide (DIOVAN-HCT) 160-25 MG tablet TK 1 T PO QD FOR BP 06/21/17   [provider]  Vitamin D, Ergocalciferol, (DRISDOL) 50000 UNITS CAPS Take 50,000 Units by mouth every 14 (fourteen) days.    [provider]    Family History Family History  Problem Relation Age of Onset  . Hypertension Mother   . Stroke Mother   . Hypertension Sister   . Diabetes Sister   . Hypertension Sister   . Diabetes Sister     Social History Social History   Tobacco Use  . Smoking status: Never Smoker  . Smokeless tobacco: Never Used  Substance Use Topics  . Alcohol use: No  . Drug use: No     Allergies   Ciprofloxacin; Lidocaine; Neosporin [neomycin-bacitracin zn-polymyx]; and Sulfa antibiotics   Review of Systems Review of  Systems  Constitutional: Positive for fatigue. Negative for activity change and fever.  Respiratory: Negative for shortness of breath.   Cardiovascular: Negative for chest pain.  Gastrointestinal: Negative for abdominal pain.  Neurological: Negative for dizziness, syncope, weakness, light-headedness and headaches.  All other systems reviewed and are negative.    Physical Exam Updated Vital Signs BP (!) 187/90   Pulse (!) 58   Temp 97.8 F (36.6 C) (Oral)   Resp 16   LMP 08/14/1981 (Approximate)   SpO2 99%   Physical Exam  Constitutional: She is oriented to person, place, and time. She appears well-developed and well-nourished.  HENT:  Head: Normocephalic and atraumatic.  Eyes: Right eye exhibits no discharge. Left eye exhibits no discharge.  Cardiovascular: Normal rate, regular rhythm and normal heart sounds.  No murmur heard. Pulmonary/Chest: Effort normal and breath sounds normal. She has no wheezes. She has no rales.  Abdominal: Soft. She exhibits no distension. There is no tenderness.  Musculoskeletal: She exhibits no edema.  Neurological: She is oriented to person, place, and time. No cranial  nerve deficit.  Skin: Skin is warm and dry. She is not diaphoretic.  Psychiatric: She has a normal mood and affect.  Nursing note and vitals reviewed.    ED Treatments / Results  Labs (all labs ordered are listed, but only abnormal results are displayed) Labs Reviewed  COMPREHENSIVE METABOLIC PANEL  CBC WITH DIFFERENTIAL/PLATELET  URINALYSIS, ROUTINE W REFLEX MICROSCOPIC  I-STAT TROPONIN, ED    EKG  EKG Interpretation None       Radiology No results found.  Procedures Procedures (including critical care time)  Medications Ordered in ED Medications - No data to display   Initial Impression / Assessment and Plan / ED Course  I have reviewed the triage vital signs and the nursing notes.  Pertinent labs & imaging results that were available during my care of the patient were reviewed by me and considered in my medical decision making (see chart for details).     Well-appearing 84 year old female presenting with asymptomatic hypertension.  Patient had been noted to have hypertension earlier this week.  Seen by primary care provider and started on carvedilol 6.25 mg.  Daughter is been taking blood pressure at home and it was noted to be high again tonight.  EMS was called and brought her here to the emergency department.  Patient has no headache.  She reports mild fatigue, no chest pain.  Urinary complaints.  No nausea no vomiting.  No visual complaints no weakness  Past medical history significant for stroke, arthritis, hypertension, hypothyroid, TIA and mitral valve.  3:35 AM Discussion had about asymptomatic hypertension and no need for workup.  Patient's daughter very frustrated because they have been waiting in the emergency department a long time and would like further workup done.  We will run screening labs and a screening EKG.  Otherwise patient is asymptomatic.  Plan will be to monitor blood pressure as an outpatient and follow-up with primary care.  Work up showed  UTI.  Will treat and have her follow up with PCP as planned.   Final Clinical Impressions(s) / ED Diagnoses   Final diagnoses:  None    ED Discharge Orders    None       Abelino Derrick, MD 08/19/17 [phone]

## 2017-08-18 NOTE — Discharge Instructions (Signed)
You were seen today with urinary tract infection.  your blood pressure is high but you have no symptoms.  Please continue to keep an eye on it and take your blood pressure medicines and follow-up with your primary care.

## 2017-08-18 NOTE — ED Notes (Signed)
Patient transported to X-ray 

## 2017-08-22 DIAGNOSIS — R8290 Unspecified abnormal findings in urine: Secondary | ICD-10-CM | POA: Insufficient documentation

## 2017-08-22 DIAGNOSIS — I10 Essential (primary) hypertension: Secondary | ICD-10-CM | POA: Diagnosis not present

## 2017-09-03 DIAGNOSIS — H16223 Keratoconjunctivitis sicca, not specified as Sjogren's, bilateral: Secondary | ICD-10-CM | POA: Diagnosis not present

## 2017-09-03 DIAGNOSIS — H353132 Nonexudative age-related macular degeneration, bilateral, intermediate dry stage: Secondary | ICD-10-CM | POA: Diagnosis not present

## 2017-09-03 DIAGNOSIS — R634 Abnormal weight loss: Secondary | ICD-10-CM | POA: Diagnosis not present

## 2017-09-03 DIAGNOSIS — N390 Urinary tract infection, site not specified: Secondary | ICD-10-CM | POA: Diagnosis not present

## 2017-09-03 DIAGNOSIS — M0589 Other rheumatoid arthritis with rheumatoid factor of multiple sites: Secondary | ICD-10-CM | POA: Diagnosis not present

## 2017-09-03 DIAGNOSIS — M150 Primary generalized (osteo)arthritis: Secondary | ICD-10-CM | POA: Diagnosis not present

## 2017-09-03 DIAGNOSIS — H04123 Dry eye syndrome of bilateral lacrimal glands: Secondary | ICD-10-CM | POA: Diagnosis not present

## 2017-09-03 DIAGNOSIS — M5136 Other intervertebral disc degeneration, lumbar region: Secondary | ICD-10-CM | POA: Diagnosis not present

## 2017-09-03 DIAGNOSIS — Z79899 Other long term (current) drug therapy: Secondary | ICD-10-CM | POA: Diagnosis not present

## 2017-09-10 ENCOUNTER — Encounter: Payer: Self-pay | Admitting: Physician Assistant

## 2017-09-10 ENCOUNTER — Ambulatory Visit (INDEPENDENT_AMBULATORY_CARE_PROVIDER_SITE_OTHER): Payer: Medicare Other | Admitting: Physician Assistant

## 2017-09-10 ENCOUNTER — Telehealth: Payer: Self-pay | Admitting: Cardiology

## 2017-09-10 VITALS — BP 160/90 | HR 71 | Ht 59.0 in | Wt 109.8 lb

## 2017-09-10 DIAGNOSIS — I10 Essential (primary) hypertension: Secondary | ICD-10-CM | POA: Diagnosis not present

## 2017-09-10 DIAGNOSIS — I639 Cerebral infarction, unspecified: Secondary | ICD-10-CM

## 2017-09-10 DIAGNOSIS — I5032 Chronic diastolic (congestive) heart failure: Secondary | ICD-10-CM | POA: Diagnosis not present

## 2017-09-10 MED ORDER — CARVEDILOL 12.5 MG PO TABS: 12.5000 mg | ORAL_TABLET | Freq: Two times a day (BID) | ORAL | 3 refills | Status: DC

## 2017-09-10 NOTE — Patient Instructions (Addendum)
Medication Instructions:  Your physician recommends that you continue on your current medications as directed. Please refer to the Current Medication list given to you today.  PLEASE CALL us WITH YOUR CARVEDILOL MEDICATION TO LET us KNOW EXACTLY HOW YOU ARE TAKING IT.  [phone] ASK FOR [place] Yadav W.  Labwork: None ordered  Testing/Procedures: None ordered  Follow-Up: Your physician recommends that you schedule a follow-up appointment in: 4 MONTHS WITH DR. Delton See   Any Other Special Instructions Will Be Listed Below (If Applicable). Two Gram Sodium Diet 2000 mg  What is Sodium? Sodium is a mineral found naturally in many foods. The most significant source of sodium in the diet is table salt, which is about 40% sodium.  Processed, convenience, and preserved foods also contain a large amount of sodium.  The body needs only 500 mg of sodium daily to function,  A normal diet provides more than enough sodium even if you do not use salt.  Why Limit Sodium? A build up of sodium in the body can cause thirst, increased blood pressure, shortness of breath, and water retention.  Decreasing sodium in the diet can reduce edema and risk of heart attack or stroke associated with high blood pressure.  Keep in mind that there are many other factors involved in these health problems.  Heredity, obesity, lack of exercise, cigarette smoking, stress and what you eat all play a role.  General Guidelines:  Do not add salt at the table or in cooking.  One teaspoon of salt contains over 2 grams of sodium.  Read food labels  Avoid processed and convenience foods  Ask your dietitian before eating any foods not dicussed in the menu planning guidelines  Consult your physician if you wish to use a salt substitute or a sodium containing medication such as antacids.  Limit milk and milk products to 16 oz (2 cups) per day.  Shopping Hints:  READ LABELS!! "Dietetic" does not necessarily mean low sodium.  Salt  and other sodium ingredients are often added to foods during processing.   Menu Planning Guidelines Food Group Choose More Often Avoid  Beverages (see also the milk group All fruit juices, low-sodium, salt-free vegetables juices, low-sodium carbonated beverages Regular vegetable or tomato juices, commercially softened water used for drinking or cooking  Breads and Cereals Enriched white, wheat, rye and pumpernickel bread, hard rolls and dinner rolls; muffins, cornbread and waffles; most dry cereals, cooked cereal without added salt; unsalted crackers and breadsticks; low sodium or homemade bread crumbs Bread, rolls and crackers with salted tops; quick breads; instant hot cereals; pancakes; commercial bread stuffing; self-rising flower and biscuit mixes; regular bread crumbs or cracker crumbs  Desserts and Sweets Desserts and sweets mad with mild should be within allowance Instant pudding mixes and cake mixes  Fats Butter or margarine; vegetable oils; unsalted salad dressings, regular salad dressings limited to 1 Tbs; light, sour and heavy cream Regular salad dressings containing bacon fat, bacon bits, and salt pork; snack dips made with instant soup mixes or processed cheese; salted nuts  Fruits Most fresh, frozen and canned fruits Fruits processed with salt or sodium-containing ingredient (some dried fruits are processed with sodium sulfites        Vegetables Fresh, frozen vegetables and low- sodium canned vegetables Regular canned vegetables, sauerkraut, pickled vegetables, and others prepared in brine; frozen vegetables in sauces; vegetables seasoned with ham, bacon or salt pork  Condiments, Sauces, Miscellaneous  Salt substitute with physician's approval; pepper, herbs, spices; vinegar, lemon or  lime juice; hot pepper sauce; garlic powder, onion powder, low sodium soy sauce (1 Tbs.); low sodium condiments (ketchup, chili sauce, mustard) in limited amounts (1 tsp.) fresh ground horseradish;  unsalted tortilla chips, pretzels, potato chips, popcorn, salsa (1/4 cup) Any seasoning made with salt including garlic salt, celery salt, onion salt, and seasoned salt; sea salt, rock salt, kosher salt; meat tenderizers; monosodium glutamate; mustard, regular soy sauce, barbecue, sauce, chili sauce, teriyaki sauce, steak sauce, Worcestershire sauce, and most flavored vinegars; canned gravy and mixes; regular condiments; salted snack foods, olives, picles, relish, horseradish sauce, catsup   Food preparation: Try these seasonings Meats:    Pork Sage, onion Serve with applesauce  Chicken Poultry seasoning, thyme, parsley Serve with cranberry sauce  Lamb Curry powder, rosemary, garlic, thyme Serve with mint sauce or jelly  Veal Marjoram, basil Serve with current jelly, cranberry sauce  Beef Pepper, bay leaf Serve with dry mustard, unsalted chive butter  Fish [place] leaf, dill Serve with unsalted lemon butter, unsalted parsley butter  Vegetables:    Asparagus Lemon juice   Broccoli Lemon juice   Carrots Mustard dressing parsley, mint, nutmeg, glazed with unsalted butter and sugar   Green beans Marjoram, lemon juice, nutmeg,dill seed   Tomatoes Basil, marjoram, onion   Spice /blend for [employer]" 4 tsp ground thyme 1 tsp ground sage 3 tsp ground rosemary 4 tsp ground marjoram   Test your knowledge 1. A product that says "Salt Free" may still contain sodium. True or False 2. Garlic Powder and Hot Pepper Sauce an be used as alternative seasonings.True or False 3. Processed foods have more sodium than fresh foods.  True or False 4. Canned Vegetables have less sodium than froze True or False  WAYS TO DECREASE YOUR SODIUM INTAKE 1. Avoid the use of added salt in cooking and at the table.  Table salt (and other prepared seasonings which contain salt) is probably one of the greatest sources of sodium in the diet.  Unsalted foods can gain flavor from the sweet, sour, and butter taste sensations of  herbs and spices.  Instead of using salt for seasoning, try the following seasonings with the foods listed.  Remember: how you use them to enhance natural food flavors is limited only by your creativity... Allspice-Meat, fish, eggs, fruit, peas, red and yellow vegetables Almond Extract-Fruit baked goods Anise Seed-Sweet breads, fruit, carrots, beets, cottage cheese, cookies (tastes like licorice) Basil-Meat, fish, eggs, vegetables, rice, vegetables salads, soups, sauces Bay Leaf-Meat, fish, stews, poultry Burnet-Salad, vegetables (cucumber-like flavor) Caraway Seed-Bread, cookies, cottage cheese, meat, vegetables, cheese, rice Cardamon-Baked goods, fruit, soups Celery Powder or seed-Salads, salad dressings, sauces, meatloaf, soup, bread.Do not use  celery salt Chervil-Meats, salads, fish, eggs, vegetables, cottage cheese (parsley-like flavor) Chili Power-Meatloaf, chicken cheese, corn, eggplant, egg dishes Chives-Salads cottage cheese, egg dishes, soups, vegetables, sauces Cilantro-Salsa, casseroles Cinnamon-Baked goods, fruit, pork, lamb, chicken, carrots Cloves-Fruit, baked goods, fish, pot roast, green beans, beets, carrots Coriander-Pastry, cookies, meat, salads, cheese (lemon-orange flavor) Cumin-Meatloaf, fish,cheese, eggs, cabbage,fruit pie (caraway flavor) [employer], fruit, eggs, fish, poultry, cottage cheese, vegetables Dill Seed-Meat, cottage cheese, poultry, vegetables, fish, salads, bread Fennel Seed-Bread, cookies, apples, pork, eggs, fish, beets, cabbage, cheese, Licorice-like flavor Garlic-(buds or powder) Salads, meat, poultry, fish, bread, butter, vegetables, potatoes.Do not  use garlic salt Ginger-Fruit, vegetables, baked goods, meat, fish, poultry Horseradish Root-Meet, vegetables, butter Lemon Juice or Extract-Vegetables, fruit, tea, baked goods, fish salads Mace-Baked goods fruit, vegetables, fish, poultry (taste like nutmeg) Maple Extract-Syrups Marjoram-Meat,  chicken,  fish, vegetables, breads, green salads (taste like Sage) Mint-Tea, lamb, sherbet, vegetables, desserts, carrots, cabbage Mustard, Dry or Seed-Cheese, eggs, meats, vegetables, poultry Nutmeg-Baked goods, fruit, chicken, eggs, vegetables, desserts Onion Powder-Meat, fish, poultry, vegetables, cheese, eggs, bread, rice salads (Do not use   Onion salt) Orange Extract-Desserts, baked goods Oregano-Pasta, eggs, cheese, onions, pork, lamb, fish, chicken, vegetables, green salads Paprika-Meat, fish, poultry, eggs, cheese, vegetables Parsley Flakes-Butter, vegetables, meat fish, poultry, eggs, bread, salads (certain forms may   Contain sodium Pepper-Meat fish, poultry, vegetables, eggs Peppermint Extract-Desserts, baked goods Poppy Seed-Eggs, bread, cheese, fruit dressings, baked goods, noodles, vegetables, cottage  [employer], poultry, meat, fish, cauliflower, turnips,eggs bread Saffron-Rice, bread, veal, chicken, fish, eggs Sage-Meat, fish, poultry, onions, eggplant, tomateos, pork, stews Savory-Eggs, salads, poultry, meat, rice, vegetables, soups, pork Tarragon-Meat, poultry, fish, eggs, butter, vegetables (licorice-like flavor)  Thyme-Meat, poultry, fish, eggs, vegetables, (clover-like flavor), sauces, soups Tumeric-Salads, butter, eggs, fish, rice, vegetables (saffron-like flavor) Vanilla Extract-Baked goods, candy Vinegar-Salads, vegetables, meat marinades Walnut Extract-baked goods, candy  2. Choose your Foods Wisely   The following is a list of foods to avoid which are high in sodium:  Meats-Avoid all smoked, canned, salt cured, dried and kosher meat and fish as well as Anchovies   [employer] meats:Bologna, Liverwurst, Pastrami Canned meat or fish  Marinated herring Caviar    Pepperoni Corned Beef   Pizza Dried chipped beef  Salami Frozen breaded fish or meat Salt pork Frankfurters or hot dogs  Sardines Gefilte  fish   Sausage Ham (boiled ham, Proscuitto Smoked butt    spiced ham)   Spam      TV Dinners Vegetables Canned vegetables (Regular) Relish Canned mushrooms  Sauerkraut Olives    Tomato juice Pickles  Bakery and Dessert Products Canned puddings  Cream pies Cheesecake   Decorated cakes Cookies  Beverages/Juices Tomato juice, regular  Gatorade   V-8 vegetable juice, regular  Breads and Cereals Biscuit mixes   Salted potato chips, corn chips, pretzels Bread stuffing mixes  Salted crackers and rolls Pancake and waffle mixes Self-rising flour  Seasonings Accent    Meat sauces Barbecue sauce  Meat tenderizer Catsup    Monosodium glutamate (MSG) Celery salt   Onion salt Chili sauce   Prepared mustard Garlic salt   Salt, seasoned salt, sea salt Gravy mixes   Soy sauce Horseradish   Steak sauce Ketchup   Tartar sauce Lite salt    Teriyaki sauce Marinade mixes   Worcestershire sauce  Others Baking powder   Cocoa and cocoa mixes Baking soda   Commercial casserole mixes Candy-caramels, chocolate  Dehydrated soups    Bars, fudge,nougats  Instant rice and pasta mixes Canned broth or soup  Maraschino cherries Cheese, aged and processed cheese and cheese spreads  Learning Assessment Quiz  Indicated T (for True) or F (for False) for each of the following statements:  1. _____ Fresh fruits and vegetables and unprocessed grains are generally low in sodium 2. _____ Water may contain a considerable amount of sodium, depending on the source 3. _____ You can always tell if a food is high in sodium by tasting it 4. _____ Certain laxatives my be high in sodium and should be avoided unless prescribed   by a physician or pharmacist 5. _____ Salt substitutes may be used freely by anyone on a sodium restricted diet 6. _____ Sodium is present in table salt, food additives and as a natural component of   most foods 7.  _____ Table salt is approximately 90% sodium 8. _____ Limiting sodium  intake may help prevent excess fluid accumulation in the body 9. _____ On a sodium-restricted diet, seasonings such as bouillon soy sauce, and    cooking wine should be used in place of table salt 10. _____ On an ingredient list, a product which lists monosodium glutamate as the first   ingredient is an appropriate food to include on a low sodium diet  Circle the best answer(s) to the following statements (Hint: there may be more than one correct answer)  11. On a low-sodium diet, some acceptable snack items are:    A. Olives  F. Bean dip   K. Grapefruit juice    B. Salted Pretzels G. Commercial Popcorn   L. Canned peaches    C. Carrot Sticks  H. Bouillon   M. Unsalted nuts   D. [place] fries  I. Peanut butter crackers N. Salami   E. Sweet pickles J. Tomato Juice   O. Pizza  12.  Seasonings that may be used freely on a reduced - sodium diet include   A. Lemon wedges F.Monosodium glutamate K. Celery seed    B.Soysauce   G. Pepper   L. Mustard powder   C. Sea salt  H. Cooking wine  M. Onion flakes   D. Vinegar  E. Prepared horseradish N. Salsa   E. Sage   J. Worcestershire sauce  O. Chutney     If you need a refill on your cardiac medications before your next appointment, please call your pharmacy.

## 2017-09-10 NOTE — Progress Notes (Signed)
Cardiology Office Note    Date:  09/10/2017   ID:  [name], [name] 1925/05/21, MRN [id number]  PCP:  [name], MD  Cardiologist: Tobias Alexander, MD  Chief Complaint  Patient presents with  . Follow-up    History of Present Illness:  Anna Mccoy is a 84 y.o. female with history of HTN, CVA 01/2015 chronic diastolic CHF,,last saw Dr. Delton See 05/04/17 and doing well with only some swelling.  Last 2D echo 02/04/16 normal LVEF 55-60% with mild MR.  Patient was in ER with UTI and BP was high.  Her medication list is different and the patient and daughter seem a little confused about what she is taking.  Coreg was started recently by primary care but the daughter thinks she is only taking it once a day.  Patient thinks she is taking it twice a day.  BP is high today 180/90 when I recheck it.  The daughter has been checking it at home and is usually 150/80.  She did eat shrimp gumbo last night from somebody else in thinks it was salty.    Past Medical History:  Diagnosis Date  . Anemia   . Arthritis   . DDD (degenerative disc disease), lumbar   . Edema   . Hx-TIA (transient ischemic attack)   . Hypertension   . Hypothyroidism    HYPOTHYROIDISM  . Menopausal state age 30  . Mitral valve regurgitation    TRIVIAL  . Post-menopausal bleeding 02/2011   Endo Biopsy 7/12 benign, no hyperplasia  . Procidentia of uterus    uses pessary  . PVC's (premature ventricular contractions)   . RBBB   . Rheumatoid arthritis(714.0)   . Stroke (HCC)   . Vitamin D deficiency disease     Past Surgical History:  Procedure Laterality Date  . CHOLECYSTECTOMY, LAPAROSCOPIC  2002  . COLONOSCOPY W/ BIOPSIES  6/09   sigmoid diverticuli recheck 5 years  . EYE SURGERY  04/24/14   Cataract sx in right eye with lens replacement.  Marland Kitchen LAMINECTOMY AND MICRODISCECTOMY LUMBAR SPINE  01/23/09  . VAGINAL DELIVERY     x5    Current Medications: Current Meds  Medication Sig  . abatacept  (ORENCIA) 250 MG injection Inject into the vein every 30 (thirty) days.   Marland Kitchen acetaminophen (TYLENOL) 500 MG tablet Take 500 mg by mouth as needed for mild pain.   Marland Kitchen amLODipine (NORVASC) 5 MG tablet Take 1 tablet by mouth daily.  Marland Kitchen aspirin 81 MG tablet Take 81 mg by mouth daily.   Marland Kitchen atenolol (TENORMIN) 50 MG tablet Take 50 mg by mouth daily.  . carvedilol (COREG) 6.25 MG tablet Take 6.25 mg by mouth 2 (two) times daily.  . cephALEXin (KEFLEX) 500 MG capsule Take 1 capsule (500 mg total) by mouth 4 (four) times daily.  Marland Kitchen conjugated estrogens (PREMARIN) vaginal cream Place 1 Applicatorful vaginally 2 (two) times a week.  . divalproex (DEPAKOTE) 250 MG DR tablet Take 1 tablet (250 mg total) by mouth every 12 (twelve) hours.  . fluorometholone (FML) 0.1 % ophthalmic suspension Place 1 drop into both eyes 4 (four) times daily.  . folic acid (FOLVITE) 1 MG tablet Take 1 mg by mouth daily.  . furosemide (LASIX) 20 MG tablet   . HYDROcodone-acetaminophen (NORCO/VICODIN) 5-325 MG tablet   . leflunomide (ARAVA) 20 MG tablet Take 20 mg by mouth every other day.  . levothyroxine (SYNTHROID, LEVOTHROID) 75 MCG tablet Take 75 mcg by mouth daily before  breakfast.  . losartan-hydrochlorothiazide (HYZAAR) 100-25 MG tablet   . megestrol (MEGACE) 400 MG/10ML suspension Take 400 mg by mouth daily.  . mirtazapine (REMERON) 15 MG tablet Take 15 mg by mouth at bedtime.  . Multiple Vitamins-Minerals (OCUVITE PRESERVISION PO) Take 1 tablet by mouth 2 (two) times daily.   Marland Kitchen POLY-IRON 150 150 MG capsule Take 150 mg by mouth daily.   Bertram Gala Glycol-Propyl Glycol (SYSTANE) 0.4-0.3 % SOLN Place 1 drop into both eyes 3 (three) times daily.   . potassium chloride SA (K-DUR,KLOR-CON) 20 MEQ tablet Take 20 mEq by mouth 2 (two) times daily.  Marland Kitchen PREMARIN vaginal cream APPLY VAGINALLY 3 TIMES A WEEK  . primidone (MYSOLINE) 50 MG tablet Take 1 tablet by mouth at bedtime.  . valsartan-hydrochlorothiazide (DIOVAN-HCT) 160-25 MG  tablet TK 1 T PO QD FOR BP  . Vitamin D, Ergocalciferol, (DRISDOL) 50000 UNITS CAPS Take 50,000 Units by mouth every 14 (fourteen) days.     Allergies:   Ciprofloxacin; Lidocaine; Neosporin [neomycin-bacitracin zn-polymyx]; and Sulfa antibiotics   Social History   Socioeconomic History  . Marital status: Widowed    Spouse name: None  . Number of children: 5  . Years of education: 8  . Highest education level: None  Social Needs  . Financial resource strain: None  . Food insecurity - worry: None  . Food insecurity - inability: None  . Transportation needs - medical: None  . Transportation needs - non-medical: None  Occupational History  . Occupation: Retired   Tobacco Use  . Smoking status: Never Smoker  . Smokeless tobacco: Never Used  Substance and Sexual Activity  . Alcohol use: No  . Drug use: No  . Sexual activity: No    Comment: Widowed 01/10/07 age 33  Other Topics Concern  . None  Social History Narrative   Patient is right handed, resides with daughter and sister.    Caffeine use: Coffee (drinks 1/2-1 cup per day)     Family History:  The patient's family history includes Diabetes in her sister and sister; Hypertension in her mother, sister, and sister; Stroke in her mother.   ROS:   Please see the history of present illness.    Review of Systems  Constitution: Negative.  HENT: Negative.   Eyes: Negative.   Cardiovascular: Negative.   Respiratory: Negative.   Hematologic/Lymphatic: Negative.   Musculoskeletal: Positive for arthritis. Negative for joint pain.  Gastrointestinal: Negative.   Genitourinary: Negative.   Neurological: Negative.    All other systems reviewed and are negative.   PHYSICAL EXAM:   VS:  Ht 4\' 11"  (1.499 m)   LMP 08/14/1981 (Approximate)   BMI 21.61 kg/m   Physical Exam  GEN: Well nourished, well developed, in no acute distress  Neck: no JVD, carotid bruits, or masses Cardiac:RRR; no murmurs, rubs, or gallops  Respiratory:   clear to auscultation bilaterally, normal work of breathing GI: soft, nontender, nondistended, + BS Ext: without cyanosis, clubbing, or edema, Good distal pulses bilaterally Neuro:  Alert and Oriented x 3 Psych: euthymic mood, full affect  Wt Readings from Last 3 Encounters:  06/27/17 107 lb (48.5 kg)  05/04/17 109 lb (49.4 kg)  04/03/17 107 lb (48.5 kg)      Studies/Labs Reviewed:   EKG:  EKG is not ordered today.    Recent Labs: 05/04/2017: NT-Pro BNP 257 08/18/2017: ALT 16; BUN 14; Creatinine, Ser 0.68; Hemoglobin 13.6; Platelets 244; Potassium 4.2; Sodium 135   Lipid Panel  Component Value Date/Time   CHOL 197 02/03/2015 0705   TRIG 61 02/03/2015 0705   HDL 65 02/03/2015 0705   CHOLHDL 3.0 02/03/2015 0705   VLDL 12 02/03/2015 0705   LDLCALC 120 (H) 02/03/2015 0705    Additional studies/ records that were reviewed today include:  TTE: 01/2016 - Left ventricle: The cavity size was normal. Wall thickness was   normal. Systolic function was normal. The estimated ejection   fraction was in the range of 55% to 60%. Left ventricular   diastolic function parameters were normal. - Aorta: Calcification of the STJ. - Mitral valve: There was mild regurgitation. - Right atrium: Possible right sided cor triatriatum not clinically   significant. - Atrial septum: No defect or patent foramen ovale was identified. - Pulmonary arteries: PA peak pressure: 31 mm Hg (S).    EKG performed today 05/04/2017 showed normal sinus rhythm normal EKG unchanged from prior.       ASSESSMENT:    1. Chronic diastolic CHF (congestive heart failure) (HCC)   2. Essential hypertension   3. Cerebrovascular accident (CVA), unspecified mechanism (HCC)      PLAN:  In order of problems listed above:  Chronic diastolic CHF well compensated  Essential hypertension blood pressure is high today.  Of asked the daughter to call us with her medications when she gets home.  If she is taking carvedilol  once a day will increase it to 6.25 mg twice a day.  If she is taking it twice a day will increase to 12.5 mg twice daily.  2 g sodium diet given follow-up with Dr. Delton See in 3-4 months.  History of CVA in past.  Needs better blood pressure control  Medication Adjustments/Labs and Tests Ordered: Current medicines are reviewed at length with the patient today.  Concerns regarding medicines are outlined above.  Medication changes, Labs and Tests ordered today are listed in the Patient Instructions below. There are no Patient Instructions on file for this visit.   Signed, Jacolyn Reedy, PA-C  09/10/2017 1:25 PM    [facility] [address] [place], [place], [place]  [address] Phone: [phone]; Fax: [phone]

## 2017-09-10 NOTE — Telephone Encounter (Signed)
Pt daughter called re: Carvedilol. She confirms she is taking 12.5 mg tablets once per day. She has been advised to increase it to bid and to monitor pts blood pressure and call us with readings. She verbalized understanding.

## 2017-09-10 NOTE — Telephone Encounter (Signed)
Follow Up:      Returning your call from today. 

## 2017-09-13 DIAGNOSIS — M0589 Other rheumatoid arthritis with rheumatoid factor of multiple sites: Secondary | ICD-10-CM | POA: Diagnosis not present

## 2017-09-26 ENCOUNTER — Ambulatory Visit (INDEPENDENT_AMBULATORY_CARE_PROVIDER_SITE_OTHER): Payer: Medicare Other | Admitting: Certified Nurse Midwife

## 2017-09-26 ENCOUNTER — Other Ambulatory Visit: Payer: Self-pay

## 2017-09-26 ENCOUNTER — Encounter: Payer: Self-pay | Admitting: Certified Nurse Midwife

## 2017-09-26 VITALS — BP 120/82 | HR 70 | Resp 16 | Ht 59.0 in | Wt 109.0 lb

## 2017-09-26 DIAGNOSIS — Z1239 Encounter for other screening for malignant neoplasm of breast: Secondary | ICD-10-CM

## 2017-09-26 DIAGNOSIS — Z4689 Encounter for fitting and adjustment of other specified devices: Secondary | ICD-10-CM

## 2017-09-26 DIAGNOSIS — I639 Cerebral infarction, unspecified: Secondary | ICD-10-CM | POA: Diagnosis not present

## 2017-09-26 DIAGNOSIS — Z1231 Encounter for screening mammogram for malignant neoplasm of breast: Secondary | ICD-10-CM

## 2017-09-26 NOTE — Progress Notes (Signed)
Subjective:   84 y.o. Widowed White female [id number] here for pessary check.  Patient has been using following pessary style and size:  2 3/4 " ring pessary with support.  She describes the following issues with the pessary:  None. Patient has not had breast exam this year. Requests today.   Use of protective clothing such as Depends or pads Yes.  . Problems with protective clothing with rash No..  Constipation issues with use of pessary No..  She is not sexually active.     ROS:   no breast pain or new or enlarging lumps on self exam,  no abnormal bleeding, pelvic pain or discharge,   no dysuria, trouble voiding or hematuria. Compliant to use of vaginal cream No.   General Exam:    LMP 08/14/1981 (Approximate)   General appearance: alert, cooperative, appears stated age and no distress   Breasts: breast exam bilateral found no skin change, nipple discharge, masses or tenderness. Axillary bilateral: no enlarged lymph nodes or tenderness   Pelvic: External genitalia:  atrophic appearance   Before pessary was removed No. prolapse over the pessary   In correct position Yes.                Urethra: not indicated and normal appearing urethra with no masses, tenderness or lesions              Vagina: normal appearing vagina with normal color and discharge, no lesions, atrophic.  There was one abrasion noted in posterior area of vaginal on right. Scant bleeding from area with removal. Patient did not complain of pain in this area. Area inspected carefully and no ulceration noted.               Cervix: normal appearance Cervical lesions were not found   Bimanual Exam:  Uterus:  normal size, contour, position, consistency, mobility, non-tender"uterus is normal size, shape, consistency and nontender"}                               Adnexa:    normal adnexa in size, nontender and no masses                             Pessary was removed without difficulty without using forceps.  Pessary was cleansed  with Betadine.  Pessary was not replaced.   Assessment : Normal Pelvic exam   Cystocele-symptomatic        Vaginal excoriations in posterior wall on right     Pessary not re-inserted     Normal breast exam   Plan:   Discussed findings of normal pelvic exam and breast exam.      Discussed can not reinsert pessary due to excoriation.    Instructed patient to use Premarin cream twice weekly in vagina to help with healing and return in 2 weeks to recheck to see if pessary can be replaced. Patient given written instructions and verbalized understanding. Patient ask that pessary be kept here,so she would not forget it. Pessary labelled for patient. Patient may need size adjustment. Will assess at RV. Questions addressed.  Rv 2 weeks

## 2017-09-26 NOTE — Patient Instructions (Signed)
Call if vaginal bleeding.

## 2017-10-08 DIAGNOSIS — G250 Essential tremor: Secondary | ICD-10-CM | POA: Diagnosis not present

## 2017-10-08 DIAGNOSIS — I10 Essential (primary) hypertension: Secondary | ICD-10-CM | POA: Diagnosis not present

## 2017-10-08 DIAGNOSIS — M06041 Rheumatoid arthritis without rheumatoid factor, right hand: Secondary | ICD-10-CM | POA: Diagnosis not present

## 2017-10-08 DIAGNOSIS — Z0000 Encounter for general adult medical examination without abnormal findings: Secondary | ICD-10-CM | POA: Insufficient documentation

## 2017-10-08 DIAGNOSIS — M06042 Rheumatoid arthritis without rheumatoid factor, left hand: Secondary | ICD-10-CM | POA: Diagnosis not present

## 2017-10-08 DIAGNOSIS — R8290 Unspecified abnormal findings in urine: Secondary | ICD-10-CM | POA: Diagnosis not present

## 2017-10-08 DIAGNOSIS — R600 Localized edema: Secondary | ICD-10-CM | POA: Diagnosis not present

## 2017-10-08 DIAGNOSIS — E039 Hypothyroidism, unspecified: Secondary | ICD-10-CM | POA: Diagnosis not present

## 2017-10-10 ENCOUNTER — Ambulatory Visit (INDEPENDENT_AMBULATORY_CARE_PROVIDER_SITE_OTHER): Payer: Medicare Other | Admitting: Certified Nurse Midwife

## 2017-10-10 ENCOUNTER — Encounter: Payer: Self-pay | Admitting: Certified Nurse Midwife

## 2017-10-10 ENCOUNTER — Other Ambulatory Visit: Payer: Self-pay

## 2017-10-10 VITALS — BP 130/70 | HR 72 | Resp 16 | Ht 59.0 in | Wt 110.0 lb

## 2017-10-10 DIAGNOSIS — N816 Rectocele: Secondary | ICD-10-CM

## 2017-10-10 DIAGNOSIS — N8110 Cystocele, unspecified: Secondary | ICD-10-CM | POA: Diagnosis not present

## 2017-10-10 DIAGNOSIS — I639 Cerebral infarction, unspecified: Secondary | ICD-10-CM | POA: Diagnosis not present

## 2017-10-10 DIAGNOSIS — Z4689 Encounter for fitting and adjustment of other specified devices: Secondary | ICD-10-CM

## 2017-10-10 NOTE — Progress Notes (Signed)
Subjective:   84 y.o. Widowed Black female [id number] here for follow up of abrasion noted in vagina on 09/26/17. Patient has been using Premarin Cream twice weekly. Denies vaginal dryness or vaginal bleeding. Patient has been using following pessary style and size:  4 " ring pessary with support, with no complaints. .  Use of protective clothing such as Depends or pads Yes.  . Problems with protective clothing with rash No..  Constipation issues with use of pessary No..  She is not sexually active.     ROS:  Patient denies no abnormal bleeding, pelvic pain or discharge, no breast pain or new or enlarging lumps on self exam, no vaginal bleeding,  no dysuria, trouble voiding or hematuria   General Exam:    LMP 08/14/1981 (Approximate)   General appearance: alert, cooperative and appears stated age   Pelvic: External genitalia:  no lesions and atrophic appearance              Urethra: normal appearance, no redness               Vagina: atrophic appearing vagina with no vaginal bleeding noted, area in posterior fornix of abrasion is no longer present. No lesions or other concerns noted. Cystocele noted grade 2 no change               Cervix: normal appearance Cervical lesions were not found   Bimanual Exam:  Uterus:  normal size, contour, position, consistency, mobility, non-tender                               Adnexa:    normal adnexa in size, nontender and no masses and no masses                            Discussed with patient her pessary #4 ring pessary is too large for her now with age changes and this contributed to her soreness and abrasion in vagina. Discussed refit today. Agreeable  Assessment :  Normal pelvic exam   Cystocele symptomatic   Mild rectocele not symptomatic   Pessary refit done with #2 ring pessary found to fit the best. Patient voided and ambulated and sat with good response.  Plan:            Discussed with daughter with patient, the need to return in 2 weeks to  recheck new pessary and to call if problems, vaginal bleeding or UTI symptoms. Patient and daughter voiced understanding. Continue Premarin cream twice weekly as before.  Rv 2 weeks, prn    An After Visit Summary was printed and given to the patient.

## 2017-10-11 DIAGNOSIS — M0589 Other rheumatoid arthritis with rheumatoid factor of multiple sites: Secondary | ICD-10-CM | POA: Diagnosis not present

## 2017-10-16 ENCOUNTER — Telehealth: Payer: Self-pay | Admitting: Certified Nurse Midwife

## 2017-10-16 NOTE — Telephone Encounter (Signed)
Spoke with patient's daughter Talbert Forest, okay per ROI. Talbert Forest states that the patient is having bleeding from her pessary. Feels this may be rectal, but unsure. Denies any pain. Pessary is still in place. Advised will need to be seen in office for further evaluation. Declines OV for today. Appointment scheduled for tomorrow 10/17/2017 at 12:45 pm with Leota Sauers CNM. Talbert Forest is agreeable. Aware if symptoms worsen or develops new symptoms will need to be seen for further evaluation earlier.  Routing to provider for final review. Patient agreeable to disposition. Will close encounter.

## 2017-10-16 NOTE — Telephone Encounter (Signed)
Patient's daughter Talbert Forest is calling to schedule an appointment for patient because she is bleeding with pessary.

## 2017-10-16 NOTE — Telephone Encounter (Signed)
Left message to call Anna Mccoy at [phone]. 

## 2017-10-17 ENCOUNTER — Other Ambulatory Visit: Payer: Self-pay

## 2017-10-17 ENCOUNTER — Ambulatory Visit (INDEPENDENT_AMBULATORY_CARE_PROVIDER_SITE_OTHER): Payer: Medicare Other | Admitting: Certified Nurse Midwife

## 2017-10-17 ENCOUNTER — Encounter: Payer: Self-pay | Admitting: Certified Nurse Midwife

## 2017-10-17 VITALS — BP 146/80 | HR 66 | Resp 16 | Wt 112.0 lb

## 2017-10-17 DIAGNOSIS — I639 Cerebral infarction, unspecified: Secondary | ICD-10-CM

## 2017-10-17 DIAGNOSIS — Z1211 Encounter for screening for malignant neoplasm of colon: Secondary | ICD-10-CM | POA: Diagnosis not present

## 2017-10-17 DIAGNOSIS — Z8719 Personal history of other diseases of the digestive system: Secondary | ICD-10-CM

## 2017-10-17 DIAGNOSIS — Z008 Encounter for other general examination: Secondary | ICD-10-CM

## 2017-10-17 DIAGNOSIS — Z01419 Encounter for gynecological examination (general) (routine) without abnormal findings: Secondary | ICD-10-CM

## 2017-10-17 DIAGNOSIS — Z960 Presence of urogenital implants: Secondary | ICD-10-CM

## 2017-10-17 DIAGNOSIS — Z9289 Personal history of other medical treatment: Secondary | ICD-10-CM

## 2017-10-17 NOTE — Progress Notes (Signed)
Subjective:   84 y.o. Widowed Black female [id number] here for pessary check.  Patient has been using following pessary style and size: 2 3/4" ring pessary   She describes the following issues with the pessary; working well, no problems with constipation since using smaller pessary. Patient noted blood with wiping after bowel movement, 5 days ago felt like it came from the rectum, but has not seen since. This occurred after last office visit with smaller pessary check. Denies blood in stool or black tarry stools. Came in to check to see if a problem. Denies incontinence or hemorrhoid flare. Daughter brought her today. Using Premarin cream as discussed in vagina with no other issues.No urinary symptoms or blood in urine.   .  ROS:   Pertinent to HPI. General Exam:    BP (!) 146/80 (BP Location: Right Arm, Patient Position: Sitting, Cuff Size: Normal)   Pulse 66   Resp 16   Wt 112 lb (50.8 kg)   LMP 08/14/1981 (Approximate)   BMI 22.62 kg/m   General appearance: alert, cooperative and appears stated age   Pelvic: External genitalia:  no lesions and atrophic appearance   Before pessary was removed No. prolapse over the pessary   In correct position Yes.                Urethra: normal appearing urethra with no masses, tenderness or lesions and blood noted from area              Vagina: normal appearing vagina with normal color and discharge, no lesions, atrophic, pessary removed and vagina inspected with no ulcerations, abrasions or blood noted. No thinning noted into rectum or stool in vagina. Cystocele with prolapse noted                 Cervix: normal appearance Cervical lesions were not found   Bimanual Exam:  Uterus: small with prolapse noted                              Adnexa:    normal adnexa in size, nontender and no masses                             Pessary was removed without difficulty without using forceps.  Pessary was cleansed with Betadine.  Pessary was replaced. Patient  tolerated procedure well.  Rectal exam: noted hemorrhoids noted with thrombosed or recent bleeding. Gentle rectal exam done with stool noted on glove, examined with q-tip roll and obvious blood, normal stool color    Assessment :  Normal pelvic exam   Cystocele with uterine prolapse, no indication of bleeding in vagina  Use of pessary continued   Rectal bleeding, ? Vaginal source from recent pessary insertion.      Plan:   Discussed finding with patient and will continue to observe. IFOB sent home with patient to do with daughter help. Advise if occurs again and warning signs reviewed.    Rv 2 months pessary check  An After Visit Summary was printed and given to the patient.

## 2017-10-24 ENCOUNTER — Ambulatory Visit: Payer: Medicare Other | Admitting: Certified Nurse Midwife

## 2017-10-30 ENCOUNTER — Telehealth: Payer: Self-pay

## 2017-10-30 NOTE — Telephone Encounter (Signed)
Pt states she has had no other bleeding & is doing fine. Pt aware to call if any problems. Pt agrees.

## 2017-10-30 NOTE — Telephone Encounter (Signed)
Pt states she has done her ifob & mailed it to [place]. We should receive it this week.

## 2017-11-08 DIAGNOSIS — M0589 Other rheumatoid arthritis with rheumatoid factor of multiple sites: Secondary | ICD-10-CM | POA: Diagnosis not present

## 2017-11-10 ENCOUNTER — Observation Stay (HOSPITAL_COMMUNITY)
Admission: EM | Admit: 2017-11-10 | Discharge: 2017-11-12 | Disposition: A | Discharge status: Home / Self Care | Payer: Medicare Other | Attending: Internal Medicine | Admitting: Internal Medicine

## 2017-11-10 ENCOUNTER — Encounter (HOSPITAL_COMMUNITY): Payer: Self-pay | Admitting: Emergency Medicine

## 2017-11-10 ENCOUNTER — Emergency Department (HOSPITAL_COMMUNITY): Payer: Medicare Other

## 2017-11-10 DIAGNOSIS — R269 Unspecified abnormalities of gait and mobility: Secondary | ICD-10-CM | POA: Diagnosis not present

## 2017-11-10 DIAGNOSIS — E785 Hyperlipidemia, unspecified: Secondary | ICD-10-CM | POA: Insufficient documentation

## 2017-11-10 DIAGNOSIS — I639 Cerebral infarction, unspecified: Secondary | ICD-10-CM | POA: Diagnosis present

## 2017-11-10 DIAGNOSIS — Z9049 Acquired absence of other specified parts of digestive tract: Secondary | ICD-10-CM | POA: Insufficient documentation

## 2017-11-10 DIAGNOSIS — Z8249 Family history of ischemic heart disease and other diseases of the circulatory system: Secondary | ICD-10-CM | POA: Insufficient documentation

## 2017-11-10 DIAGNOSIS — J9811 Atelectasis: Secondary | ICD-10-CM | POA: Diagnosis not present

## 2017-11-10 DIAGNOSIS — Z823 Family history of stroke: Secondary | ICD-10-CM | POA: Insufficient documentation

## 2017-11-10 DIAGNOSIS — Z882 Allergy status to sulfonamides status: Secondary | ICD-10-CM | POA: Diagnosis not present

## 2017-11-10 DIAGNOSIS — E876 Hypokalemia: Secondary | ICD-10-CM | POA: Insufficient documentation

## 2017-11-10 DIAGNOSIS — R5383 Other fatigue: Secondary | ICD-10-CM

## 2017-11-10 DIAGNOSIS — G250 Essential tremor: Secondary | ICD-10-CM | POA: Diagnosis not present

## 2017-11-10 DIAGNOSIS — Z881 Allergy status to other antibiotic agents status: Secondary | ICD-10-CM | POA: Insufficient documentation

## 2017-11-10 DIAGNOSIS — I110 Hypertensive heart disease with heart failure: Secondary | ICD-10-CM | POA: Insufficient documentation

## 2017-11-10 DIAGNOSIS — R509 Fever, unspecified: Secondary | ICD-10-CM | POA: Insufficient documentation

## 2017-11-10 DIAGNOSIS — Z7982 Long term (current) use of aspirin: Secondary | ICD-10-CM | POA: Insufficient documentation

## 2017-11-10 DIAGNOSIS — D649 Anemia, unspecified: Secondary | ICD-10-CM | POA: Insufficient documentation

## 2017-11-10 DIAGNOSIS — Z79899 Other long term (current) drug therapy: Secondary | ICD-10-CM | POA: Diagnosis not present

## 2017-11-10 DIAGNOSIS — G629 Polyneuropathy, unspecified: Secondary | ICD-10-CM | POA: Insufficient documentation

## 2017-11-10 DIAGNOSIS — M40209 Unspecified kyphosis, site unspecified: Secondary | ICD-10-CM | POA: Diagnosis not present

## 2017-11-10 DIAGNOSIS — M069 Rheumatoid arthritis, unspecified: Secondary | ICD-10-CM | POA: Diagnosis not present

## 2017-11-10 DIAGNOSIS — R404 Transient alteration of awareness: Secondary | ICD-10-CM | POA: Diagnosis not present

## 2017-11-10 DIAGNOSIS — R569 Unspecified convulsions: Secondary | ICD-10-CM | POA: Insufficient documentation

## 2017-11-10 DIAGNOSIS — E559 Vitamin D deficiency, unspecified: Secondary | ICD-10-CM | POA: Insufficient documentation

## 2017-11-10 DIAGNOSIS — F418 Other specified anxiety disorders: Secondary | ICD-10-CM | POA: Insufficient documentation

## 2017-11-10 DIAGNOSIS — M5136 Other intervertebral disc degeneration, lumbar region: Secondary | ICD-10-CM | POA: Diagnosis not present

## 2017-11-10 DIAGNOSIS — H3530 Unspecified macular degeneration: Secondary | ICD-10-CM | POA: Insufficient documentation

## 2017-11-10 DIAGNOSIS — Z833 Family history of diabetes mellitus: Secondary | ICD-10-CM | POA: Insufficient documentation

## 2017-11-10 DIAGNOSIS — I10 Essential (primary) hypertension: Secondary | ICD-10-CM | POA: Diagnosis present

## 2017-11-10 DIAGNOSIS — I071 Rheumatic tricuspid insufficiency: Secondary | ICD-10-CM | POA: Insufficient documentation

## 2017-11-10 DIAGNOSIS — Z884 Allergy status to anesthetic agent status: Secondary | ICD-10-CM | POA: Insufficient documentation

## 2017-11-10 DIAGNOSIS — Z8673 Personal history of transient ischemic attack (TIA), and cerebral infarction without residual deficits: Secondary | ICD-10-CM | POA: Diagnosis not present

## 2017-11-10 DIAGNOSIS — R55 Syncope and collapse: Principal | ICD-10-CM

## 2017-11-10 DIAGNOSIS — I5033 Acute on chronic diastolic (congestive) heart failure: Secondary | ICD-10-CM | POA: Diagnosis not present

## 2017-11-10 DIAGNOSIS — E039 Hypothyroidism, unspecified: Secondary | ICD-10-CM | POA: Diagnosis not present

## 2017-11-10 DIAGNOSIS — L219 Seborrheic dermatitis, unspecified: Secondary | ICD-10-CM | POA: Insufficient documentation

## 2017-11-10 DIAGNOSIS — I4510 Unspecified right bundle-branch block: Secondary | ICD-10-CM | POA: Diagnosis not present

## 2017-11-10 HISTORY — DX: Syncope and collapse: R55

## 2017-11-10 LAB — URINALYSIS, ROUTINE W REFLEX MICROSCOPIC
BACTERIA UA: NONE SEEN
BILIRUBIN URINE: NEGATIVE
Glucose, UA: NEGATIVE mg/dL
Hgb urine dipstick: NEGATIVE
KETONES UR: 5 mg/dL — AB
NITRITE: NEGATIVE
PROTEIN: NEGATIVE mg/dL
RBC / HPF: NONE SEEN RBC/hpf (ref 0–5)
SPECIFIC GRAVITY, URINE: 1.011 (ref 1.005–1.030)
pH: 7 (ref 5.0–8.0)

## 2017-11-10 LAB — HEPATIC FUNCTION PANEL
ALT: 14 U/L (ref 14–54)
AST: 19 U/L (ref 15–41)
Albumin: 3.2 g/dL — ABNORMAL LOW (ref 3.5–5.0)
Alkaline Phosphatase: 52 U/L (ref 38–126)
BILIRUBIN INDIRECT: 0.4 mg/dL (ref 0.3–0.9)
Bilirubin, Direct: 0.1 mg/dL (ref 0.1–0.5)
TOTAL PROTEIN: 5.9 g/dL — AB (ref 6.5–8.1)
Total Bilirubin: 0.5 mg/dL (ref 0.3–1.2)

## 2017-11-10 LAB — BASIC METABOLIC PANEL
ANION GAP: 9 (ref 5–15)
BUN: 10 mg/dL (ref 6–20)
CO2: 25 mmol/L (ref 22–32)
Calcium: 8.6 mg/dL — ABNORMAL LOW (ref 8.9–10.3)
Chloride: 101 mmol/L (ref 101–111)
Creatinine, Ser: 0.78 mg/dL (ref 0.44–1.00)
GFR calc Af Amer: >60 mL/min (ref >60)
GLUCOSE: 89 mg/dL (ref 65–99)
POTASSIUM: 3.9 mmol/L (ref 3.5–5.1)
Sodium: 135 mmol/L (ref 135–145)

## 2017-11-10 LAB — CBG MONITORING, ED: GLUCOSE-CAPILLARY: 76 mg/dL (ref 65–99)

## 2017-11-10 LAB — CBC
HEMATOCRIT: 36.7 % (ref 36.0–46.0)
HEMOGLOBIN: 12 g/dL (ref 12.0–15.0)
MCH: 31.3 pg (ref 26.0–34.0)
MCHC: 32.7 g/dL (ref 30.0–36.0)
MCV: 95.6 fL (ref 78.0–100.0)
Platelets: 171 10*3/uL (ref 150–400)
RBC: 3.84 MIL/uL — ABNORMAL LOW (ref 3.87–5.11)
RDW: 14.9 % (ref 11.5–15.5)
WBC: 6.5 10*3/uL (ref 4.0–10.5)

## 2017-11-10 LAB — I-STAT TROPONIN, ED: TROPONIN I, POC: 0 ng/mL (ref 0.00–0.08)

## 2017-11-10 MED ORDER — FOLIC ACID 1 MG PO TABS
1.0000 mg | ORAL_TABLET | Freq: Every day | ORAL | Status: DC
  Administered 2017-11-11 – 2017-11-12 (×2): 1 mg via ORAL
  Filled 2017-11-10 (×2): qty 1

## 2017-11-10 MED ORDER — HYDROCHLOROTHIAZIDE 25 MG PO TABS
25.0000 mg | ORAL_TABLET | Freq: Every day | ORAL | Status: DC
Start: 2017-11-11 — End: 2017-11-12
  Administered 2017-11-11 – 2017-11-12 (×2): 25 mg via ORAL
  Filled 2017-11-10 (×2): qty 1

## 2017-11-10 MED ORDER — AMLODIPINE BESYLATE 5 MG PO TABS
5.0000 mg | ORAL_TABLET | Freq: Every day | ORAL | Status: DC
  Administered 2017-11-11 – 2017-11-12 (×2): 5 mg via ORAL
  Filled 2017-11-10 (×2): qty 1

## 2017-11-10 MED ORDER — IRBESARTAN 300 MG PO TABS
300.0000 mg | ORAL_TABLET | Freq: Every day | ORAL | Status: DC
  Administered 2017-11-11 – 2017-11-12 (×2): 300 mg via ORAL
  Filled 2017-11-10 (×2): qty 1

## 2017-11-10 MED ORDER — POLYSACCHARIDE IRON COMPLEX 150 MG PO CAPS
150.0000 mg | ORAL_CAPSULE | Freq: Every day | ORAL | Status: DC
  Administered 2017-11-11 – 2017-11-12 (×2): 150 mg via ORAL
  Filled 2017-11-10 (×2): qty 1

## 2017-11-10 MED ORDER — POTASSIUM CHLORIDE CRYS ER 20 MEQ PO TBCR
20.0000 meq | EXTENDED_RELEASE_TABLET | Freq: Two times a day (BID) | ORAL | Status: DC
  Administered 2017-11-10 – 2017-11-12 (×4): 20 meq via ORAL
  Filled 2017-11-10 (×4): qty 1

## 2017-11-10 MED ORDER — ENOXAPARIN SODIUM 40 MG/0.4ML ~~LOC~~ SOLN
40.0000 mg | SUBCUTANEOUS | Status: DC
  Administered 2017-11-10 – 2017-11-11 (×2): 40 mg via SUBCUTANEOUS
  Filled 2017-11-10 (×2): qty 0.4

## 2017-11-10 MED ORDER — LEFLUNOMIDE 20 MG PO TABS
20.0000 mg | ORAL_TABLET | ORAL | Status: DC
Start: 2017-11-12 — End: 2017-11-12
  Administered 2017-11-12: 20 mg via ORAL
  Filled 2017-11-10: qty 1

## 2017-11-10 MED ORDER — POLYVINYL ALCOHOL 1.4 % OP SOLN
1.0000 [drp] | Freq: Three times a day (TID) | OPHTHALMIC | Status: DC
  Administered 2017-11-10 – 2017-11-12 (×5): 1 [drp] via OPHTHALMIC
  Filled 2017-11-10: qty 15

## 2017-11-10 MED ORDER — DIVALPROEX SODIUM 250 MG PO DR TAB
250.0000 mg | DELAYED_RELEASE_TABLET | Freq: Two times a day (BID) | ORAL | Status: DC
  Administered 2017-11-10 – 2017-11-12 (×4): 250 mg via ORAL
  Filled 2017-11-10 (×4): qty 1

## 2017-11-10 MED ORDER — PRIMIDONE 50 MG PO TABS
50.0000 mg | ORAL_TABLET | Freq: Two times a day (BID) | ORAL | Status: DC
Start: 2017-11-10 — End: 2017-11-10
  Filled 2017-11-10: qty 1

## 2017-11-10 MED ORDER — POLYETHYL GLYCOL-PROPYL GLYCOL 0.4-0.3 % OP SOLN: 1.0000 [drp] | Freq: Three times a day (TID) | OPHTHALMIC | Status: DC

## 2017-11-10 MED ORDER — FLUOROMETHOLONE 0.1 % OP SUSP
1.0000 [drp] | Freq: Four times a day (QID) | OPHTHALMIC | Status: DC
  Administered 2017-11-10 – 2017-11-12 (×6): 1 [drp] via OPHTHALMIC
  Filled 2017-11-10: qty 5

## 2017-11-10 MED ORDER — OLMESARTAN MEDOXOMIL-HCTZ 40-25 MG PO TABS: 1.0000 | ORAL_TABLET | Freq: Every day | ORAL | Status: DC

## 2017-11-10 MED ORDER — LEVOTHYROXINE SODIUM 75 MCG PO TABS
75.0000 ug | ORAL_TABLET | Freq: Every day | ORAL | Status: DC
Start: 2017-11-11 — End: 2017-11-12
  Administered 2017-11-11 – 2017-11-12 (×2): 75 ug via ORAL
  Filled 2017-11-10 (×2): qty 1

## 2017-11-10 MED ORDER — ASPIRIN EC 81 MG PO TBEC
81.0000 mg | DELAYED_RELEASE_TABLET | Freq: Every day | ORAL | Status: DC
Start: 2017-11-11 — End: 2017-11-12
  Administered 2017-11-11 – 2017-11-12 (×2): 81 mg via ORAL
  Filled 2017-11-10 (×2): qty 1

## 2017-11-10 MED ORDER — CYCLOSPORINE 0.05 % OP EMUL
1.0000 [drp] | Freq: Two times a day (BID) | OPHTHALMIC | Status: DC
  Administered 2017-11-10 – 2017-11-12 (×4): 1 [drp] via OPHTHALMIC
  Filled 2017-11-10 (×4): qty 1

## 2017-11-10 MED ORDER — ACETAMINOPHEN 650 MG RE SUPP: 650.0000 mg | Freq: Four times a day (QID) | RECTAL | Status: DC | PRN

## 2017-11-10 MED ORDER — ACETAMINOPHEN 325 MG PO TABS
650.0000 mg | ORAL_TABLET | Freq: Four times a day (QID) | ORAL | Status: DC | PRN
  Administered 2017-11-11: 650 mg via ORAL
  Filled 2017-11-10: qty 2

## 2017-11-10 MED ORDER — ATORVASTATIN CALCIUM 20 MG PO TABS
20.0000 mg | ORAL_TABLET | Freq: Every day | ORAL | Status: DC
  Administered 2017-11-11 – 2017-11-12 (×2): 20 mg via ORAL
  Filled 2017-11-10 (×2): qty 1

## 2017-11-10 NOTE — ED Provider Notes (Signed)
[facility] EMERGENCY DEPARTMENT Provider Note   CSN: [id number] Arrival date & time: 11/10/17  1347     History   Chief Complaint Chief Complaint  Patient presents with  . Loss of Consciousness    HPI Anna Mccoy is a 84 y.o. female.  HPI   Thursday had an appointment for orencia infusion for arthritis, blood pressures were elevated that 180/100 Was taking losartan, there was recall and that was cancelled earlier this week. Gave carvedilol.  Started it on Wed or Thursday 12.5 once daily.  180 on recheck with PCP.  Told to take 2 in the AM and 2 evening so 25mg  BID. Still on 40mg  olmesartan. Had rapid increase from 1 tablet to 4 tablets daily, family concerned blood pressures with EMS decreased from 180s yesterday to 120s today. Normally she has pressures in 140s.  This AM finished breakfast, washing her dishes, and then said she felt tired and sat down in the chair. Daughter had said something to her and she didn't answer at first. Then mumbled and sat back and at first she wasn't saying anything, and then she said what.  At one point not responding, lasted about 8 minutes.  Got her out of the chair on the floor per emergency responder advice, Thought about doing CPR but could tell she was breathing.  By the time EMS arrived she was communicating but not a lot.  Daughter reports she was out of it for 8 minutes or so.  Patient reports an overwhelming feeling of fatigue, does not remember passing out, reports she still feels tired but now how she did earlier. No chest pain or dyspnea.   Past Medical History:  Diagnosis Date  . Anemia   . Arthritis   . DDD (degenerative disc disease), lumbar   . Edema   . Hx-TIA (transient ischemic attack)   . Hypertension   . Hypothyroidism    HYPOTHYROIDISM  . Menopausal state age 81  . Mitral valve regurgitation    TRIVIAL  . Post-menopausal bleeding 02/2011   Endo Biopsy 7/12 benign, no hyperplasia  . Procidentia of  uterus    uses pessary  . PVC's (premature ventricular contractions)   . RBBB   . Rheumatoid arthritis(714.0)   . Stroke (HCC)   . Vitamin D deficiency disease     Patient Active Problem List   Diagnosis Date Noted  . Encounter for Medicare annual wellness exam 10/08/2017  . Abnormal urinalysis 08/22/2017  . Gait disturbance 06/06/2017  . Frailty 10/07/2016  . Kyphosis (acquired) (postural) 10/07/2016  . Bilateral leg edema 01/13/2016  . Hypertensive heart disease with heart failure (HCC) 01/13/2016  . Acute on chronic diastolic CHF (congestive heart failure), NYHA class 2 (HCC) 01/13/2016  . Blurred vision, bilateral 12/31/2015  . Bilateral dry eyes 12/31/2015  . Chronic fatigue 12/15/2015  . Chronic diarrhea of unknown origin 12/04/2015  . Essential tremor 09/29/2015  . Aphthous ulcer 09/28/2015  . Cardiac arrhythmia 09/28/2015  . Cataract 09/28/2015  . 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 History of headache 09/28/2015  . Hypertrophic toenail 09/28/2015  . Hypokalemia 09/28/2015  . Osteoporosis 09/28/2015  . Peripheral neuropathy 09/28/2015  . Seborrheic dermatitis 09/28/2015  . Situational anxiety 09/28/2015  . Vitamin D deficiency 09/28/2015  . Weight loss 09/28/2015  . History of stroke 09/28/2015  . UTI (lower urinary tract infection)   . Seizure cerebral   . CVA (cerebral infarction) 02/02/2015  . Stroke (HCC) 02/02/2015  .  Rheumatoid arthritis (HCC) 02/02/2015  . Headache 02/02/2015  . Hypertension 02/02/2015  . Hypothyroidism 02/02/2015  . Cerebral infarction due to unspecified mechanism   . Uterovaginal prolapse, complete 04/18/2013  . Complete uterovaginal prolapse 04/18/2013    Past Surgical History:  Procedure Laterality Date  . CHOLECYSTECTOMY, LAPAROSCOPIC  2002  . COLONOSCOPY W/ BIOPSIES  6/09   sigmoid diverticuli recheck 5 years  . EYE SURGERY  04/24/14   Cataract sx in right eye with lens replacement.  Marland Kitchen LAMINECTOMY AND  MICRODISCECTOMY LUMBAR SPINE  01/23/09  . VAGINAL DELIVERY     x5     OB History    Gravida  5   Para  5   Term  5   Preterm  0   AB  0   Living  5     SAB  0   TAB  0   Ectopic  0   Multiple  0   Live Births  5            Home Medications    Prior to Admission medications   Medication Sig Start Date End Date Taking? Authorizing Provider  abatacept (ORENCIA) 250 MG injection Inject into the vein every 30 (thirty) days.    Yes [provider]  acetaminophen (TYLENOL) 500 MG tablet Take 500 mg by mouth as needed for mild pain.    Yes [provider]  amLODipine (NORVASC) 5 MG tablet Take 1 tablet by mouth daily. 11/23/16  Yes [provider]  aspirin 81 MG tablet Take 81 mg by mouth daily.    Yes [provider]  atorvastatin (LIPITOR) 20 MG tablet Take 1 tablet (20 mg total) by mouth daily. 05/04/17 11/10/17 Yes Lars Masson, MD  carvedilol (COREG) 12.5 MG tablet Take 1 tablet (12.5 mg total) by mouth 2 (two) times daily. 09/10/17 12/09/17 Yes Dyann Kief, PA-C  conjugated estrogens (PREMARIN) vaginal cream Place 1 Applicatorful vaginally 2 (two) times a week.   Yes [provider]  divalproex (DEPAKOTE) 250 MG DR tablet Take 1 tablet (250 mg total) by mouth every 12 (twelve) hours. 02/05/15  Yes Rhetta Mura, MD  fluorometholone (FML) 0.1 % ophthalmic suspension Place 1 drop into both eyes 4 (four) times daily. 10/13/16  Yes [provider]  folic acid (FOLVITE) 1 MG tablet Take 1 mg by mouth daily.   Yes [provider]  furosemide (LASIX) 20 MG tablet  10/20/16  Yes [provider]  leflunomide (ARAVA) 20 MG tablet Take 20 mg by mouth every other day.   Yes [provider]  levothyroxine (SYNTHROID, LEVOTHROID) 75 MCG tablet Take 75 mcg by mouth daily before breakfast.   Yes [provider]  Multiple Vitamins-Minerals (OCUVITE PRESERVISION PO) Take 1 tablet by mouth  2 (two) times daily.    Yes [provider]  olmesartan-hydrochlorothiazide (BENICAR HCT) 40-25 MG tablet Take 1 tablet by mouth daily. 11/05/17  Yes [provider]  POLY-IRON 150 150 MG capsule Take 150 mg by mouth daily.  10/13/15  Yes [provider]  Polyethyl Glycol-Propyl Glycol (SYSTANE) 0.4-0.3 % SOLN Place 1 drop into both eyes 3 (three) times daily.    Yes [provider]  polyethylene glycol (MIRALAX) packet Take 17 g by mouth as needed for mild constipation.   Yes [provider]  potassium chloride SA (K-DUR,KLOR-CON) 20 MEQ tablet Take 20 mEq by mouth 2 (two) times daily.   Yes [provider]  prednisoLONE acetate (  PRED FORTE) 1 % ophthalmic suspension  09/24/17  Yes [provider]  PREMARIN vaginal cream APPLY VAGINALLY 3 TIMES A WEEK 12/04/16  Yes Ria Comment, FNP  primidone (MYSOLINE) 50 MG tablet Take 1 tablet by mouth 2 (two) times daily.  09/20/16  Yes [provider]  RESTASIS MULTIDOSE 0.05 % ophthalmic emulsion Take 1 drop by mouth 2 (two) times daily. 09/05/17  Yes [provider]  triamcinolone ointment (KENALOG) 0.5 % APPLY THIN LAYER EXTERNALLY TO THE AFFECTED AREA TWICE DAILY 10/08/17  Yes [provider]  Vitamin D, Ergocalciferol, (DRISDOL) 50000 UNITS CAPS Take 50,000 Units by mouth every 14 (fourteen) days.   Yes [provider]    Family History Family History  Problem Relation Age of Onset  . Hypertension Mother   . Stroke Mother   . Hypertension Sister   . Diabetes Sister   . Hypertension Sister   . Diabetes Sister     Social History Social History   Tobacco Use  . Smoking status: Never Smoker  . Smokeless tobacco: Never Used  Substance Use Topics  . Alcohol use: No  . Drug use: No     Allergies   Ciprofloxacin; Lidocaine; Neosporin [neomycin-bacitracin zn-polymyx]; and Sulfa antibiotics   Review of Systems Review of Systems  Constitutional:  Positive for fatigue. Negative for fever.  HENT: Negative for sore throat.   Eyes: Negative for visual disturbance.  Respiratory: Negative for cough and shortness of breath.   Cardiovascular: Negative for chest pain.  Gastrointestinal: Negative for abdominal pain, nausea and vomiting.  Genitourinary: Negative for difficulty urinating and dysuria.  Musculoskeletal: Negative for back pain and neck pain.  Skin: Negative for rash.  Neurological: Positive for syncope (per daughter, pt reports she was tired). Negative for weakness, numbness and headaches.     Physical Exam Updated Vital Signs BP (!) 170/89   Pulse 73   Temp 97.6 F (36.4 C) (Oral)   Resp (!) 36   Ht 4\' 9"  (1.448 m)   Wt 49.9 kg (110 lb)   LMP 08/14/1981 (Approximate)   SpO2 96%   BMI 23.80 kg/m   Physical Exam  Constitutional: She is oriented to person, place, and time. She appears well-developed and well-nourished. No distress.  HENT:  Head: Normocephalic and atraumatic.  Eyes: Conjunctivae and EOM are normal.  Neck: Normal range of motion.  Cardiovascular: Normal rate, regular rhythm, normal heart sounds and intact distal pulses. Exam reveals no gallop and no friction rub.  No murmur heard. Pulmonary/Chest: Effort normal and breath sounds normal. No respiratory distress. She has no wheezes. She has no rales.  Abdominal: Soft. She exhibits no distension. There is no tenderness. There is no guarding.  Musculoskeletal: She exhibits no edema or tenderness.  Neurological: She is alert and oriented to person, place, and time. She has normal strength. No cranial nerve deficit or sensory deficit. Coordination and gait (baseline) normal. GCS eye subscore is 4. GCS verbal subscore is 5. GCS motor subscore is 6.  Skin: Skin is warm and dry. No rash noted. She is not diaphoretic. No erythema.  Nursing note and vitals reviewed.    ED Treatments / Results  Labs (all labs ordered are listed, but only abnormal results are  displayed) Labs Reviewed  BASIC METABOLIC PANEL - Abnormal; Notable for the following components:      Result Value   Calcium 8.6 (*)    All other components within normal limits  CBC - Abnormal; Notable for the following  components:   RBC 3.84 (*)    All other components within normal limits  URINALYSIS, ROUTINE W REFLEX MICROSCOPIC - Abnormal; Notable for the following components:   Ketones, ur 5 (*)    Leukocytes, UA TRACE (*)    Squamous Epithelial / LPF 0-5 (*)    All other components within normal limits  HEPATIC FUNCTION PANEL - Abnormal; Notable for the following components:   Total Protein 5.9 (*)    Albumin 3.2 (*)    All other components within normal limits  CBG MONITORING, ED  I-STAT TROPONIN, ED    EKG EKG Interpretation  Date/Time:  Saturday November 10 2017 13:51:52 EDT Ventricular Rate:  71 PR Interval:    QRS Duration: 137 QT Interval:  437 QTC Calculation: 475 R Axis:   -81 Text Interpretation:  Sinus rhythm IVCD, consider atypical RBBB No significant change since last tracing Confirmed by Alvira Monday ([id number]) on 11/10/2017 2:03:01 PM   Radiology Dg Chest 2 View  Result Date: 11/10/2017 CLINICAL DATA:  states she passed out today after taking a new HTN medication. Pt denies any CP EXAM: CHEST - 2 VIEW COMPARISON:  08/18/2017 FINDINGS: Lateral view degraded by patient arm position. Apical lordotic frontal portable radiograph. Midline trachea. Normal heart size and mediastinal contours for age. No pleural effusion or pneumothorax. Diminished lung volumes. Vague increased density at the lung bases is favored to represent dependent atelectasis. Lungs are otherwise clear. IMPRESSION: Diminished lung volumes.  No acute findings. Electronically Signed   By: Jeronimo Greaves M.D.   On: 11/10/2017 15:23    Procedures Procedures (including critical care time)  Medications Ordered in ED Medications - No data to display   Initial Impression / Assessment and Plan /  ED Course  I have reviewed the triage vital signs and the nursing notes.  Pertinent labs & imaging results that were available during my care of the patient were reviewed by me and considered in my medical decision making (see chart for details).    84 year old female with a history of hypertension, hypothyroidism, rheumatoid arthritis, right bundle branch block, presents with concern for fatigue and syncopal episode.  Patient hemodynamically stable on arrival to the emergency department.  Urinalysis shows no signs of infection, chest x-ray shows no signs of pneumonia or congestive heart failure.  Initial troponin is negative.  EKG is unchanged from prior.  Neurologic exam is within normal limits, and have low suspicion for CVA.  Patient denies chest pain or shortness of breath, have low suspicion for pulmonary embolus.  She has never had an episode like this before.  Discussed options with family, and will plan on admission for further syncope observation, telemetry monitoring.  Patient had recent changes in blood pressure medications, including stopping the losartan, and starting carvedilol, which was rapidly increased from 112.5 mg tablet, to 25 mg twice daily.  Medication change may be responsible for patient's feeling of fatigue, and possibly contributed to her syncopal episode, however will admit for continued observation of patient's heart rate, and monitoring in the setting of syncope.  Final Clinical Impressions(s) / ED Diagnoses   Final diagnoses:  Syncope, unspecified syncope type  Other fatigue    ED Discharge Orders    None       Alvira Monday, MD 11/10/17 [phone]

## 2017-11-10 NOTE — ED Triage Notes (Signed)
Per EMS- Pt was at home. Woke up  And was doing normal daily activity, walking and cooking breakfast. She was sitting in her chair when the daughter noted she had passed out. She was unresponsive to voice but was breathing. Pt then woke up and was saying "what" CBG 130. Alert and oriented, but respiratory rate in the 30s. O2 sats good on room air. Pt daughter very anxious. Did note that the patient was saying she was having a hard time catching her breath. 18G PIV to left AC.

## 2017-11-10 NOTE — ED Notes (Signed)
Pt family requesting pt eat. Meal tray ordered for the patient.

## 2017-11-10 NOTE — ED Notes (Signed)
Attempted Report 

## 2017-11-10 NOTE — Progress Notes (Addendum)
Pt's RR= 28. Pt states she is not short of breath. MD is aware. No orders to release at this time. RN paged MD for orders.

## 2017-11-10 NOTE — H&P (Signed)
Date: 11/10/2017               Patient Name:  Anna Mccoy MRN: [id number]  DOB: 16-Jun-1925 Age / Sex: 84 y.o., female   PCP: [name], MD         Medical Service: Internal Medicine Teaching Service         Attending Physician: Dr. Gust Rung, DO    First Contact: Dr. Evelene Croon Pager: [phone]  Second Contact: Dr. Samuella Cota Pager: [phone]       After Hours (After 5p/  First Contact Pager: [phone]  weekends / holidays): Second Contact Pager: [phone]   Chief Complaint: Unresponsiveness  History of Present Illness:  Anna Mccoy is a 84yo female with PMH of HTN, h/o CVA with sequelae of seizures, macular degeneration, RA, HLD, and hypothyroidism. Patients states she woke up very tired this morning but she was up eating breakfast, then when washing the dishes she became even more fatigued and sat down in a chair. Other than fatigue, patient denies blurry vision, diaphoresis, chest pain, palpitations, n/v. She does not remember anything until EMS arrived. Anna Mccoy states that she saw her mom sitting down in the chair and did not respond to her; she initially mumbled then "went out" without responding. After calling EMS, she lowered her mom to the ground, checked that she was breathing on her own. She states that she was completely unresponsive for about 4-5 minutes; after coming to when EMS arrived she did not know what had occurred and had some persistent confusion. She denies seizure like activity (no tongue biting, loss of bowel or bladder control) or unilateral weakness. This episode occurred at about 12:30pm. No recent illnesses. She denies fever, chills, chest pain, palpitations, SOB, cough, abdominal pain, N/V, urinary symptoms of changes in bowel movement.   Family reports recent changed in blood pressure including stopping losartan on 3/28 due to recall and starting Coreg 12.5 mg QD on 3/29. This was increased recently to 25 mg BID. However it appears family was instructed  to increase to 12.5 mg BID before starting 25 mg BID. She took her AM dose today.   Patient has h/o of 1 seizure about 3 years ago when she had her CVA. She was placed on Depakote by Dr. Pearlean Brownie and is compliant with it. Had not had any seizure activity since then.   ED course: Patient was hypertensive on arrival to the ED with BP 168/80. She was otherwise afebrile, with normal heart rate and oxygenating well on room air. Workup including CBC, BMP, UA, and CXR unremarkable. EKG with low voltage but otherwise unremarkable. Orthostatics were negative. Did not receive any medications in the ED.   Review of Systems: A complete ROS was negative except as per HPI.    Meds:  Current Meds  Medication Sig  . abatacept (ORENCIA) 250 MG injection Inject into the vein every 30 (thirty) days.   Marland Kitchen acetaminophen (TYLENOL) 500 MG tablet Take 500 mg by mouth as needed for mild pain.   Marland Kitchen amLODipine (NORVASC) 5 MG tablet Take 1 tablet by mouth daily.  Marland Kitchen aspirin 81 MG tablet Take 81 mg by mouth daily.   Marland Kitchen atorvastatin (LIPITOR) 20 MG tablet Take 1 tablet (20 mg total) by mouth daily.  . carvedilol (COREG) 12.5 MG tablet Take 1 tablet (12.5 mg total) by mouth 2 (two) times daily.  Marland Kitchen conjugated estrogens (PREMARIN) vaginal cream Place 1 Applicatorful vaginally 2 (two) times a week.  Marland Kitchen  divalproex (DEPAKOTE) 250 MG DR tablet Take 1 tablet (250 mg total) by mouth every 12 (twelve) hours.  . fluorometholone (FML) 0.1 % ophthalmic suspension Place 1 drop into both eyes 4 (four) times daily.  . folic acid (FOLVITE) 1 MG tablet Take 1 mg by mouth daily.  . furosemide (LASIX) 20 MG tablet   . leflunomide (ARAVA) 20 MG tablet Take 20 mg by mouth every other day.  . levothyroxine (SYNTHROID, LEVOTHROID) 75 MCG tablet Take 75 mcg by mouth daily before breakfast.  . Multiple Vitamins-Minerals (OCUVITE PRESERVISION PO) Take 1 tablet by mouth 2 (two) times daily.   Marland Kitchen olmesartan-hydrochlorothiazide (BENICAR HCT) 40-25 MG tablet  Take 1 tablet by mouth daily.  Marland Kitchen POLY-IRON 150 150 MG capsule Take 150 mg by mouth daily.   Bertram Gala Glycol-Propyl Glycol (SYSTANE) 0.4-0.3 % SOLN Place 1 drop into both eyes 3 (three) times daily.   . polyethylene glycol (MIRALAX) packet Take 17 g by mouth as needed for mild constipation.  . potassium chloride SA (K-DUR,KLOR-CON) 20 MEQ tablet Take 20 mEq by mouth 2 (two) times daily.  . prednisoLONE acetate (PRED FORTE) 1 % ophthalmic suspension   . PREMARIN vaginal cream APPLY VAGINALLY 3 TIMES A WEEK  . primidone (MYSOLINE) 50 MG tablet Take 1 tablet by mouth 2 (two) times daily.   . RESTASIS MULTIDOSE 0.05 % ophthalmic emulsion Take 1 drop by mouth 2 (two) times daily.  Marland Kitchen triamcinolone ointment (KENALOG) 0.5 % APPLY THIN LAYER EXTERNALLY TO THE AFFECTED AREA TWICE DAILY  . Vitamin D, Ergocalciferol, (DRISDOL) 50000 UNITS CAPS Take 50,000 Units by mouth every 14 (fourteen) days.   Allergies: Allergies as of 11/10/2017 - Review Complete 11/10/2017  Allergen Reaction Noted  . Ciprofloxacin Diarrhea 06/09/2013  . Lidocaine  11/10/2011  . Neosporin [neomycin-bacitracin zn-polymyx]  12/03/2012  . Sulfa antibiotics  11/10/2011   Past Medical History:  Diagnosis Date  . Anemia   . Arthritis   . DDD (degenerative disc disease), lumbar   . Edema   . Hx-TIA (transient ischemic attack)   . Hypertension   . Hypothyroidism    HYPOTHYROIDISM  . Menopausal state age 78  . Mitral valve regurgitation    TRIVIAL  . Post-menopausal bleeding 02/2011   Endo Biopsy 7/12 benign, no hyperplasia  . Procidentia of uterus    uses pessary  . PVC's (premature ventricular contractions)   . RBBB   . Rheumatoid arthritis(714.0)   . Stroke (HCC)   . Vitamin D deficiency disease     Family History:  Family History  Problem Relation Age of Onset  . Hypertension Mother   . Stroke Mother   . Hypertension Sister   . Diabetes Sister   . Hypertension Sister   . Diabetes Sister     Social  History:  Social History   Tobacco Use  . Smoking status: Never Smoker  . Smokeless tobacco: Never Used  Substance Use Topics  . Alcohol use: No  . Drug use: No    Physical Exam: Blood pressure (!) 176/90, pulse 76, temperature 97.6 F (36.4 C), temperature source Oral, resp. rate (!) 28, height 4\' 11"  (1.499 m), weight 107 lb 12.8 oz (48.9 kg), last menstrual period 08/14/1981, SpO2 99 %.  Physical Exam  Constitutional: She is oriented to person, place, and time.  Elderly, frail female lying in bed in no acute distress   HENT:  Head: Normocephalic and atraumatic.  Mouth/Throat: Oropharynx is clear and moist. No oropharyngeal exudate.  Eyes: Pupils  are equal, round, and reactive to light. Conjunctivae and EOM are normal.  Neck: Normal range of motion. Neck supple.  Cardiovascular: Normal rate, regular rhythm and normal heart sounds. Exam reveals no gallop and no friction rub.  No murmur heard. Pulmonary/Chest: Effort normal and breath sounds normal. No respiratory distress. She has no wheezes. She has no rales.     Abdominal: Soft. Bowel sounds are normal. She exhibits no distension. There is no tenderness.  Musculoskeletal: She exhibits no edema or deformity.  Neurological: She is alert and oriented to person, place, and time. No cranial nerve deficit. She exhibits normal muscle tone. Coordination normal.    EKG: personally reviewed my interpretation is HR 71 bpm, low voltage, no ST changes noted   CXR: personally reviewed my interpretation is low lung volumes and rotated, otherwise unremarkable   Assessment & Plan by Problem: Principal Problem:   Syncope Active Problems:   History of Stroke in 2016 without residual deficits (HCC)   Hypertension   Hypothyroidism  # Syncope: Patient presents after a syncopal episode preceded only by marked fatigue and lasted 5 minutes. Initial workup including CBC, BMP, UA, EKG, and CXR failed to reveal a cause. Orthostatic VS negative. No  neurological findings on exam suggestive of acute intracranial process. Family does report recent changes in blood pressure medications including stopping losartan and started Coreg 2 days ago. It appears patient went from 12.5 mg QD to 25 mg BID.  She last took Coreg this morning prior to syncopal event. She was not bradycardic on presentation and EKG only remarkable for low voltage which is similar to prior EKGs. She does have a history of hypothyroidism  - On telemetry  - Follow up TTE  - Holding home Coreg  # HTN: Patient was found to be hypertensive on arrival with BP 160s/80s.  Per family, this has been an issue since 3/26 since losartan was stopped and Coreg initiated. - Continue home amlodipine 5 mg and olmesartan-HCTZ 40-25 mg QD - Holding home Coreg    # Hypothyroidism: Last TSH 0.328 on 10/2014 - Follow up TSH - Continue home Synthroid   # Hx of CVA in 2016 with sequelae of seizures:  No residual deficits. Performs ADLs independently at home. Uses a walker at baseline and able to ambulate without difficulty.  - Continue home ASA 81 mg  - Continue home Depakote 500 mg BID   F: none  E: monitoring  N: HH diet   VTE ppx: SQ Lovenox   Code status: Full code, confirmed on admission    Dispo: Admit patient to Observation with expected length of stay less than 2 midnights.  SignedBurna Cash, MD 11/10/2017, 7:26 PM  Pager: [phone]

## 2017-11-11 ENCOUNTER — Other Ambulatory Visit: Payer: Self-pay

## 2017-11-11 ENCOUNTER — Observation Stay (HOSPITAL_BASED_OUTPATIENT_CLINIC_OR_DEPARTMENT_OTHER): Payer: Medicare Other

## 2017-11-11 DIAGNOSIS — Z79899 Other long term (current) drug therapy: Secondary | ICD-10-CM

## 2017-11-11 DIAGNOSIS — I361 Nonrheumatic tricuspid (valve) insufficiency: Secondary | ICD-10-CM

## 2017-11-11 DIAGNOSIS — R55 Syncope and collapse: Secondary | ICD-10-CM | POA: Diagnosis not present

## 2017-11-11 DIAGNOSIS — I69398 Other sequelae of cerebral infarction: Secondary | ICD-10-CM

## 2017-11-11 DIAGNOSIS — E039 Hypothyroidism, unspecified: Secondary | ICD-10-CM

## 2017-11-11 DIAGNOSIS — R5383 Other fatigue: Secondary | ICD-10-CM

## 2017-11-11 DIAGNOSIS — R05 Cough: Secondary | ICD-10-CM | POA: Diagnosis not present

## 2017-11-11 DIAGNOSIS — R569 Unspecified convulsions: Secondary | ICD-10-CM | POA: Diagnosis not present

## 2017-11-11 DIAGNOSIS — I10 Essential (primary) hypertension: Secondary | ICD-10-CM | POA: Diagnosis not present

## 2017-11-11 DIAGNOSIS — Z7982 Long term (current) use of aspirin: Secondary | ICD-10-CM | POA: Diagnosis not present

## 2017-11-11 DIAGNOSIS — Z79890 Hormone replacement therapy (postmenopausal): Secondary | ICD-10-CM | POA: Diagnosis not present

## 2017-11-11 DIAGNOSIS — R509 Fever, unspecified: Secondary | ICD-10-CM | POA: Diagnosis not present

## 2017-11-11 DIAGNOSIS — D649 Anemia, unspecified: Secondary | ICD-10-CM

## 2017-11-11 DIAGNOSIS — R0981 Nasal congestion: Secondary | ICD-10-CM | POA: Diagnosis not present

## 2017-11-11 LAB — CBC
HCT: 32.7 % — ABNORMAL LOW (ref 36.0–46.0)
HEMOGLOBIN: 10.4 g/dL — AB (ref 12.0–15.0)
MCH: 30.1 pg (ref 26.0–34.0)
MCHC: 31.8 g/dL (ref 30.0–36.0)
MCV: 94.5 fL (ref 78.0–100.0)
Platelets: 163 10*3/uL (ref 150–400)
RBC: 3.46 MIL/uL — ABNORMAL LOW (ref 3.87–5.11)
RDW: 14.6 % (ref 11.5–15.5)
WBC: 5.7 10*3/uL (ref 4.0–10.5)

## 2017-11-11 LAB — MAGNESIUM: Magnesium: 1.9 mg/dL (ref 1.7–2.4)

## 2017-11-11 LAB — INFLUENZA PANEL BY PCR (TYPE A & B)
INFLBPCR: NEGATIVE
Influenza A By PCR: NEGATIVE

## 2017-11-11 LAB — ECHOCARDIOGRAM COMPLETE
Height: 59 in
Weight: 1673.6 oz

## 2017-11-11 LAB — TSH: TSH: 2.218 u[IU]/mL (ref 0.350–4.500)

## 2017-11-11 LAB — BASIC METABOLIC PANEL
ANION GAP: 10 (ref 5–15)
BUN: 8 mg/dL (ref 6–20)
CO2: 25 mmol/L (ref 22–32)
Calcium: 8.6 mg/dL — ABNORMAL LOW (ref 8.9–10.3)
Chloride: 104 mmol/L (ref 101–111)
Creatinine, Ser: 0.71 mg/dL (ref 0.44–1.00)
GFR calc Af Amer: >60 mL/min (ref >60)
GLUCOSE: 79 mg/dL (ref 65–99)
POTASSIUM: 3.7 mmol/L (ref 3.5–5.1)
Sodium: 139 mmol/L (ref 135–145)

## 2017-11-11 MED ORDER — METOPROLOL TARTRATE 5 MG/5ML IV SOLN
2.5000 mg | INTRAVENOUS | Status: DC | PRN
  Administered 2017-11-11: 2.5 mg via INTRAVENOUS
  Filled 2017-11-11: qty 5

## 2017-11-11 NOTE — Evaluation (Signed)
Physical Therapy Evaluation Patient Details Name: Anna Mccoy MRN: [id number] DOB: Feb 23, 1925 Today's Date: 11/11/2017   History of Present Illness  84 year old female with past medical history of hypertension, CVA, subsequent seizures, rheumatoid arthritis, hypothyroidism. She was brought in by family after a syncopal episode. patient presents with fever, HTN, and anemia.   Clinical Impression  Orders received for PT evaluation. Patient demonstrates deficits in functional mobility as indicated below. Will benefit from continued skilled PT to address deficits and maximize function. Will see as indicated and progress as tolerated.  Recommend HHPT upon acute discharge.    Follow Up Recommendations Home health PT;Supervision/Assistance - 24 hour    Equipment Recommendations  None recommended by PT    Recommendations for Other Services       Precautions / Restrictions Precautions Precautions: Fall      Mobility  Bed Mobility Overal bed mobility: Needs Assistance Bed Mobility: Supine to Sit     Supine to sit: Supervision     General bed mobility comments: increased time to perform supervision for safety and line management  Transfers Overall transfer level: Needs assistance Equipment used: 1 person hand held assist Transfers: Sit to/from Stand Sit to Stand: Min assist         General transfer comment: min assist for stability when coming to upright, flexed posture  Ambulation/Gait Ambulation/Gait assistance: Min guard;Min assist Ambulation Distance (Feet): 110 Feet Assistive device: Rolling walker (2 wheeled) Gait Pattern/deviations: Shuffle;Trunk flexed;Drifts right/left     General Gait Details: patient reuqired min assist for control of RW, increased gait speed and some instability noted  Stairs            Wheelchair Mobility    Modified Rankin (Stroke Patients Only)       Balance Overall balance assessment: Mild deficits observed, not  formally tested                                           Pertinent Vitals/Pain Pain Assessment: No/denies pain    Home Living Family/patient expects to be discharged to:: Private residence Living Arrangements: Children Available Help at Discharge: Family Type of Home: House Home Access: Stairs to enter   Secretary/administrator of Steps: 2 Home Layout: One level Home Equipment: Environmental consultant - 2 wheels;Walker - 4 wheels;Shower seat;Toilet riser      Prior Function Level of Independence: Independent with assistive device(s)         Comments: uses rollator     Hand Dominance   Dominant Hand: Right    Extremity/Trunk Assessment   Upper Extremity Assessment Upper Extremity Assessment: Generalized weakness    Lower Extremity Assessment Lower Extremity Assessment: Generalized weakness    Cervical / Trunk Assessment Cervical / Trunk Assessment: Kyphotic  Communication   Communication: No difficulties  Cognition Arousal/Alertness: Awake/alert Behavior During Therapy: WFL for tasks assessed/performed Overall Cognitive Status: Within Functional Limits for tasks assessed                                        General Comments      Exercises     Assessment/Plan    PT Assessment Patient needs continued PT services  PT Problem List Decreased strength;Decreased activity tolerance;Decreased balance;Decreased mobility;Decreased safety awareness       PT Treatment Interventions DME instruction;Gait  training;Stair training;Therapeutic activities;Functional mobility training;Therapeutic exercise;Balance training;Patient/family education    PT Goals (Current goals can be found in the Care Plan section)  Acute Rehab PT Goals Patient Stated Goal: to go home PT Goal Formulation: With patient/family Time For Goal Achievement: 11/25/17 Potential to Achieve Goals: Good    Frequency Min 3X/week   Barriers to discharge         Co-evaluation               AM-PAC PT "6 Clicks" Daily Activity  Outcome Measure Difficulty turning over in bed (including adjusting bedclothes, sheets and blankets)?: A Little Difficulty moving from lying on back to sitting on the side of the bed? : A Little Difficulty sitting down on and standing up from a chair with arms (e.g., wheelchair, bedside commode, etc,.)?: A Little Help needed moving to and from a bed to chair (including a wheelchair)?: A Little Help needed walking in hospital room?: A Little Help needed climbing 3-5 steps with a railing? : A Little 6 Click Score: 18    End of Session Equipment Utilized During Treatment: Gait belt Activity Tolerance: Patient limited by fatigue Patient left: in bed;with call bell/phone within reach;with nursing/sitter in room;with family/visitor present Nurse Communication: Mobility status PT Visit Diagnosis: Unsteadiness on feet (R26.81);Difficulty in walking, not elsewhere classified (R26.2)    Time: 1749-4496 PT Time Calculation (min) (ACUTE ONLY): 18 min   Charges:   PT Evaluation $PT Eval Moderate Complexity: 1 Mod     PT G Codes:        Charlotte Crumb, PT DPT  Board Certified Neurologic Specialist [phone]   Fabio Asa 11/11/2017, 12:43 PM

## 2017-11-11 NOTE — Progress Notes (Signed)
Droplet precautions removed, Family and pt made aware.

## 2017-11-11 NOTE — Progress Notes (Signed)
Echocardiogram 2D Echocardiogram has been performed.  Anna Mccoy 11/11/2017, 9:47 AM

## 2017-11-11 NOTE — Progress Notes (Addendum)
Notified on call  Provider of patient's BP 194/88. Order for 2.5mg  of metoprolol given prn. Order initiated Will continue to monitor patient.

## 2017-11-11 NOTE — Progress Notes (Addendum)
Subjective:  Patient became hypertensive with sBP in the 200s. She received one dose of IV metop with improvement in BP. She was also noted to be febrile with T 101.3. This morning patient was sitting up in bed eating breakfast. She continues to feel weak but much improved from yesterday which her daughter agrees with. She was able to ambulate to the bathroom with assistance. Denies dizziness or increased weakness. She is otherwise feeling fine and voiced no other complaints. Her daughter is concerned about her fever overnight and states patient's appear to be congested and has a cough.   Objective:  Vital signs in last 24 hours: Vitals:   11/11/17 0220 11/11/17 0300 11/11/17 0341 11/11/17 0440  BP: (!) 177/107 (!) 195/105 (!) 170/90 (!) 160/80  Pulse:    70  Resp:    18  Temp: 100.1 F (37.8 C) 98.4 F (36.9 C)  98.8 F (37.1 C)  TempSrc:  Oral  Oral  SpO2:    97%  Weight:    104 lb 9.6 oz (47.4 kg)  Height:       Physical Exam  Constitutional: She is oriented to person, place, and time.  Frail, thin, elderly female who is well developed and appears comfortable and in no acute distress while eating breakfast  HENT:  Mouth/Throat: Oropharynx is clear and moist. No oropharyngeal exudate.  Cardiovascular: Normal rate, regular rhythm and normal heart sounds. Exam reveals no gallop and no friction rub.  No murmur heard. Pulmonary/Chest: Effort normal and breath sounds normal. No respiratory distress. She has no wheezes. She has no rales.  Musculoskeletal: She exhibits no edema.  Lymphadenopathy:    She has no cervical adenopathy.  Neurological: She is alert and oriented to person, place, and time.    Assessment/Plan:  Principal Problem:   Syncope Active Problems:   History of Stroke in 2016 without residual deficits (HCC)   Hypertension   Hypothyroidism  # Syncope: Patient presented after a syncopal episode preceded only by marked fatigue and weakness for 1 day. This is  likely beta blocker induced fatigue/syncope given recent changed in medication including stopping losartan and starting Coreg. The last one which was increased from 12.5 mg QD to 25 mg BID. Has not been bradycardic during this admission. Telemetry reviewed this AM, lowest HR in high 50s. Range HR 56-80s. Workup including CBC, BMP, UA, and CXR unremarkable.  - On telemetry  - Follow up TTE  - Stop Coreg  - PT/OT consult   # HTN: Patient was hypertensive overnight with BP 218/101. She received one dose of IV metoprolol with improvement in BP.  - Continue home amlodipine 5 mg and olmesartan-HCTZ 40-25 mg QD. On irbesartan 300 mg QD while here.  - Stop home Coreg    # Fever: Patient was febrile overnight with T 101.3 which improved with Tylenol. She denied symptoms concerning for infection on admission, but daughter reports today patient appears congested and has had a cough for the past 2 days.  - Follow up flu PCR   # Normocytic anemia: She has a  history of anemia with baseline 10-12. CBC today showed Hgb 10.4 <--12 on admission. WBC and platelets also decreased compared to yesterday but normal. She has not received any IVF during this admission. ?Secondary to chronic disease given history of RA. No iron studies or B12 available to review.  - Continue home folic acid and iron  - Monitor for signs/symptoms of acute bleed  - CBC in AM   #  Hypothyroidism: Last TSH 0.328 on 10/2014. TSH 2.2 3/30.  - Continue home Synthroid 75 mcg  # Hx of CVA in 2016 with sequelae of seizures:  No residual deficits. Performs ADLs independently at home. Uses a walker at baseline and able to ambulate without difficulty.  - Continue home ASA 81 mg  - Continue home Depakote 500 mg BID    Dispo: Anticipated discharge in approximately 1 day(s).   Burna Cash, MD 11/11/2017, 8:41 AM Pager: [phone]

## 2017-11-12 ENCOUNTER — Other Ambulatory Visit: Payer: Self-pay | Admitting: Internal Medicine

## 2017-11-12 DIAGNOSIS — Z888 Allergy status to other drugs, medicaments and biological substances status: Secondary | ICD-10-CM

## 2017-11-12 DIAGNOSIS — Z79890 Hormone replacement therapy (postmenopausal): Secondary | ICD-10-CM | POA: Diagnosis not present

## 2017-11-12 DIAGNOSIS — Z881 Allergy status to other antibiotic agents status: Secondary | ICD-10-CM

## 2017-11-12 DIAGNOSIS — Z882 Allergy status to sulfonamides status: Secondary | ICD-10-CM

## 2017-11-12 DIAGNOSIS — Z884 Allergy status to anesthetic agent status: Secondary | ICD-10-CM | POA: Diagnosis not present

## 2017-11-12 DIAGNOSIS — R55 Syncope and collapse: Secondary | ICD-10-CM | POA: Diagnosis not present

## 2017-11-12 DIAGNOSIS — I69398 Other sequelae of cerebral infarction: Secondary | ICD-10-CM | POA: Diagnosis not present

## 2017-11-12 DIAGNOSIS — R561 Post traumatic seizures: Secondary | ICD-10-CM | POA: Diagnosis not present

## 2017-11-12 DIAGNOSIS — Z7982 Long term (current) use of aspirin: Secondary | ICD-10-CM | POA: Diagnosis not present

## 2017-11-12 DIAGNOSIS — I10 Essential (primary) hypertension: Secondary | ICD-10-CM | POA: Diagnosis not present

## 2017-11-12 DIAGNOSIS — Z79899 Other long term (current) drug therapy: Secondary | ICD-10-CM | POA: Diagnosis not present

## 2017-11-12 DIAGNOSIS — R509 Fever, unspecified: Secondary | ICD-10-CM | POA: Diagnosis not present

## 2017-11-12 DIAGNOSIS — E039 Hypothyroidism, unspecified: Secondary | ICD-10-CM | POA: Diagnosis not present

## 2017-11-12 DIAGNOSIS — D649 Anemia, unspecified: Secondary | ICD-10-CM | POA: Diagnosis not present

## 2017-11-12 LAB — CBC
HEMATOCRIT: 32.6 % — AB (ref 36.0–46.0)
HEMOGLOBIN: 11 g/dL — AB (ref 12.0–15.0)
MCH: 31.8 pg (ref 26.0–34.0)
MCHC: 33.7 g/dL (ref 30.0–36.0)
MCV: 94.2 fL (ref 78.0–100.0)
Platelets: 168 10*3/uL (ref 150–400)
RBC: 3.46 MIL/uL — ABNORMAL LOW (ref 3.87–5.11)
RDW: 15.1 % (ref 11.5–15.5)
WBC: 6.4 10*3/uL (ref 4.0–10.5)

## 2017-11-12 LAB — FECAL OCCULT BLOOD, IMMUNOCHEMICAL: IMMUNOLOGICAL FECAL OCCULT BLOOD TEST: NEGATIVE

## 2017-11-12 MED ORDER — AMLODIPINE BESYLATE 5 MG PO TABS
5.0000 mg | ORAL_TABLET | Freq: Once | ORAL | Status: AC
  Administered 2017-11-12: 5 mg via ORAL
  Filled 2017-11-12: qty 1

## 2017-11-12 MED ORDER — AMLODIPINE BESYLATE 10 MG PO TABS: 10.0000 mg | ORAL_TABLET | Freq: Every day | ORAL | 0 refills | Status: DC

## 2017-11-12 MED ORDER — AMLODIPINE BESYLATE 10 MG PO TABS: 10.0000 mg | ORAL_TABLET | Freq: Every day | ORAL | Status: DC

## 2017-11-12 NOTE — Care Management Note (Signed)
Case Management Note  Patient Details  Name: Anna Mccoy MRN: [id number] Date of Birth: 04-28-1925  Subjective/Objective:   Syncope                Action/Plan: Patient lives at home with her daughter; PCP is [name], MD; has private insurance with Medicare; pharmacy of choice is [facility]; HHC choice offered, daughter chose [facility]; Dan with [facility] called for arrangements. Attending MD please enter the face to face for [facility] services.  Expected Discharge Date:  11/13/17               Expected Discharge Plan:  Home w Home Health Services  Discharge planning Services  CM Consult  Choice offered to:  Patient, Spouse  HH Arranged:  RN, PT [facility] Agency:  [facility]  Status of Service:  In process, will continue to follow  Reola Mosher [phone] 11/12/2017, 10:32 AM

## 2017-11-12 NOTE — Progress Notes (Signed)
Internal Medicine Attending:   I saw and examined the patient. I reviewed the resident's note and I agree with the resident's findings and plan as documented in the resident's note.  Patient feels well today with no new complaints.  She denies any lightheadedness or palpitations or recurrent episodes of syncope.  Patient was initially admitted for syncopal episode likely secondary to medication changes including titrating her carvedilol from 12.5 mg daily to 25 mg twice daily.  Reviewed her telemetry this morning and it showed normal heart rates with the lowest in the 50s. 2D echo results noted.  Patient remains mildly hypertensive with SBP in the 160s.  We will increase her home amlodipine to 10 mg from 5 mg and continue with her ARB and HCTZ.  Her home carvedilol has been discontinued.  No further workup at this time.  Patient will need outpatient follow-up with her PCP.  Patient is stable for discharge home today.

## 2017-11-12 NOTE — Evaluation (Signed)
Occupational Therapy Evaluation Patient Details Name: Anna Mccoy MRN: [id number] DOB: 01-28-1925 Today's Date: 11/12/2017    History of Present Illness 84 year old female with past medical history of hypertension, CVA, subsequent seizures, rheumatoid arthritis, hypothyroidism. She was brought in by family after a syncopal episode. patient presents with fever, HTN, and anemia.    Clinical Impression   This 84 y/o F presents with the above. At baseline pt is mod independent with ADLs and functional mobility using rollator, lives with two of her daughters with additional daughter living across the street. Pt completed room level functional mobility at RW level, seated and standing grooming ADLs with overall MinA this session; currently requires minA for LB and toileting ADLs, presenting with generalized weakness and decreased activity tolerance. Pt will benefit from continued acute OT services to maximize her overall safety and independence with ADLs and functional mobility prior to return home. Do not anticipate pt will require follow up OT services.      Follow Up Recommendations  No OT follow up;Supervision/Assistance - 24 hour    Equipment Recommendations  None recommended by OT(pt's DME needs are met )           Precautions / Restrictions Precautions Precautions: Fall Restrictions Weight Bearing Restrictions: No      Mobility Bed Mobility Overal bed mobility: Needs Assistance Bed Mobility: Supine to Sit     Supine to sit: Supervision     General bed mobility comments: increased time to perform supervision for safety  Transfers Overall transfer level: Needs assistance Equipment used: 1 person hand held assist;Rolling walker (2 wheeled) Transfers: Sit to/from Stand Sit to Stand: Min assist         General transfer comment: min assist for stability when coming to upright, flexed posture    Balance Overall balance assessment: Needs assistance Sitting-balance  support: Feet supported Sitting balance-Leahy Scale: Good     Standing balance support: Bilateral upper extremity supported;During functional activity Standing balance-Leahy Scale: Poor Standing balance comment: pt reliant on UE support/use of forearm support when standing at sink                            ADL either performed or assessed with clinical judgement   ADL Overall ADL's : Needs assistance/impaired Eating/Feeding: Modified independent;Sitting   Grooming: Set up;Sitting;Minimal assistance;Standing;Wash/dry face;Wash/dry hands;Oral care Grooming Details (indicate cue type and reason): standing x1 task; sitting at sink for remaining tasks  Upper Body Bathing: Min guard;Sitting   Lower Body Bathing: Minimal assistance;Sit to/from stand   Upper Body Dressing : Min guard;Sitting   Lower Body Dressing: Minimal assistance;Sit to/from stand Lower Body Dressing Details (indicate cue type and reason): pt reaching to adjust socks prior to exiting bed  Toilet Transfer: Minimal assistance;Stand-pivot;BSC;RW Toilet Transfer Details (indicate cue type and reason): pt requesting to use BSC vs ambulating to bathroom  Toileting- Clothing Manipulation and Hygiene: Minimal assistance;Sit to/from stand Toileting - Clothing Manipulation Details (indicate cue type and reason): steadying assist in standing while pt performs clothing management and peri-care after voiding bladder      Functional mobility during ADLs: Minimal assistance;Rolling walker General ADL Comments: pt completing toileting, and grooming ADLs at sink, initially in standing however pt reporting need to sit, completing additional grooming ADLs sitting on BSC at sink with steup/minguard assist; pt ambulating from sink to recliner using RW with minA, requires verbal cues for keeping closer proximity to RW and for safe RW use  Pertinent Vitals/Pain Pain Assessment: No/denies pain      Hand Dominance Right   Extremity/Trunk Assessment Upper Extremity Assessment Upper Extremity Assessment: Generalized weakness       Cervical / Trunk Assessment Cervical / Trunk Assessment: Kyphotic   Communication Communication Communication: No difficulties   Cognition Arousal/Alertness: Awake/alert Behavior During Therapy: WFL for tasks assessed/performed Overall Cognitive Status: Within Functional Limits for tasks assessed                                     General Comments  Pt's daughter present during session                Home Living Family/patient expects to be discharged to:: Private residence Living Arrangements: Children Available Help at Discharge: Family Type of Home: House Home Access: Stairs to enter Technical brewer of Steps: 2   Home Layout: One level     Bathroom Shower/Tub: Teacher, early years/pre: Standard Bathroom Accessibility: Yes   Home Equipment: Environmental consultant - 2 wheels;Walker - 4 wheels;Shower seat;Toilet riser          Prior Functioning/Environment Level of Independence: Independent with assistive device(s)        Comments: uses rollator; pt reports independence with ADLs        OT Problem List: Decreased strength;Impaired balance (sitting and/or standing);Decreased activity tolerance;Decreased knowledge of use of DME or AE      OT Treatment/Interventions: Self-care/ADL training;DME and/or AE instruction;Therapeutic activities;Balance training;Therapeutic exercise;Energy conservation;Patient/family education    OT Goals(Current goals can be found in the care plan section) Acute Rehab OT Goals Patient Stated Goal: to go home OT Goal Formulation: With patient Time For Goal Achievement: 11/26/17 Potential to Achieve Goals: Good  OT Frequency: Min 2X/week                             AM-PAC PT "6 Clicks" Daily Activity     Outcome Measure Help from another person eating meals?:  None Help from another person taking care of personal grooming?: A Little Help from another person toileting, which includes using toliet, bedpan, or urinal?: A Little Help from another person bathing (including washing, rinsing, drying)?: A Little Help from another person to put on and taking off regular upper body clothing?: None Help from another person to put on and taking off regular lower body clothing?: A Little 6 Click Score: 20   End of Session Equipment Utilized During Treatment: Rolling walker Nurse Communication: Mobility status  Activity Tolerance: Patient tolerated treatment well Patient left: in chair;with call bell/phone within reach;with nursing/sitter in room;with family/visitor present  OT Visit Diagnosis: Unsteadiness on feet (R26.81);Muscle weakness (generalized) (M62.81)                Time: 3474-2595 OT Time Calculation (min): 26 min Charges:  OT General Charges $OT Visit: 1 Visit OT Evaluation $OT Eval Moderate Complexity: 1 Mod OT Treatments $Self Care/Home Management : 8-22 mins G-Codes:     Lou Cal, OT Pager [phone] 11/12/2017   Raymondo Band 11/12/2017, 9:47 AM

## 2017-11-12 NOTE — Progress Notes (Signed)
Physical Therapy Treatment Patient Details Name: Anna Mccoy MRN: [id number] DOB: 1924/09/30 Today's Date: 11/12/2017    History of Present Illness 84 year old female with past medical history of hypertension, CVA, subsequent seizures, rheumatoid arthritis, hypothyroidism. She was brought in by family after a syncopal episode. patient presents with fever, HTN, and anemia.     PT Comments    Patient received up in chair, very pleasant and willing to participate in PT this morning. Her overall mobility appears to be improving, however she does benefit from min guard and cues for safety with RW today. Able to toilet and perform self-care with general S from PT. She was able to gain train approximately 160f total today with cues for safety and gait form, fatigued at end of gait distance. She was left up in the chair with all needs met, daughter present.     Follow Up Recommendations  Home health PT;Supervision/Assistance - 24 hour     Equipment Recommendations  None recommended by PT    Recommendations for Other Services       Precautions / Restrictions Precautions Precautions: Fall Restrictions Weight Bearing Restrictions: No    Mobility  Bed Mobility Overal bed mobility: Needs Assistance Bed Mobility: Supine to Sit     Supine to sit: Supervision     General bed mobility comments: DNT, received up in chair   Transfers Overall transfer level: Needs assistance Equipment used: Rolling walker (2 wheeled) Transfers: Sit to/from Stand Sit to Stand: Min guard         General transfer comment: Min guard for safety, however mobility appears much improved today   Ambulation/Gait Ambulation/Gait assistance: Min guard Ambulation Distance (Feet): 140 Feet Assistive device: Rolling walker (2 wheeled) Gait Pattern/deviations: Shuffle;Trunk flexed;Drifts right/left     General Gait Details: continues to require Min cues for safe use of RW, however overall safety is  greatly improving, balance without significant deficit; gait distance limited by fatigue    Stairs            Wheelchair Mobility    Modified Rankin (Stroke Patients Only)       Balance Overall balance assessment: Needs assistance Sitting-balance support: Feet supported Sitting balance-Leahy Scale: Good     Standing balance support: Bilateral upper extremity supported;During functional activity Standing balance-Leahy Scale: Fair Standing balance comment: reliant on UE support                             Cognition Arousal/Alertness: Awake/alert Behavior During Therapy: WFL for tasks assessed/performed Overall Cognitive Status: Within Functional Limits for tasks assessed                                        Exercises      General Comments General comments (skin integrity, edema, etc.): Pt's daughter present during session       Pertinent Vitals/Pain Pain Assessment: No/denies pain    Home Living Family/patient expects to be discharged to:: Private residence Living Arrangements: Children Available Help at Discharge: Family Type of Home: House Home Access: Stairs to enter   Home Layout: One level Home Equipment: WEnvironmental consultant- 2 wheels;Walker - 4 wheels;Shower seat;Toilet riser      Prior Function Level of Independence: Independent with assistive device(s)      Comments: uses rollator; pt reports independence with ADLs   PT Goals (current goals can  now be found in the care plan section) Acute Rehab PT Goals Patient Stated Goal: to go home PT Goal Formulation: With patient/family Time For Goal Achievement: 11/25/17 Potential to Achieve Goals: Good Progress towards PT goals: Progressing toward goals    Frequency    Min 3X/week      PT Plan Current plan remains appropriate    Co-evaluation              AM-PAC PT "6 Clicks" Daily Activity  Outcome Measure  Difficulty turning over in bed (including adjusting  bedclothes, sheets and blankets)?: A Little Difficulty moving from lying on back to sitting on the side of the bed? : A Little Difficulty sitting down on and standing up from a chair with arms (e.g., wheelchair, bedside commode, etc,.)?: A Little Help needed moving to and from a bed to chair (including a wheelchair)?: A Little Help needed walking in hospital room?: A Little Help needed climbing 3-5 steps with a railing? : A Little 6 Click Score: 18    End of Session Equipment Utilized During Treatment: Gait belt Activity Tolerance: Patient tolerated treatment well Patient left: in chair;with call bell/phone within reach;with family/visitor present   PT Visit Diagnosis: Unsteadiness on feet (R26.81);Difficulty in walking, not elsewhere classified (R26.2)     Time: 1423-9532 PT Time Calculation (min) (ACUTE ONLY): 21 min  Charges:  $Gait Training: 8-22 mins                    G Codes:       Deniece Ree PT, DPT, CBIS  Supplemental Physical Therapist [place]   Pager [phone]

## 2017-11-12 NOTE — Progress Notes (Signed)
Subjective:  Pt ambulating well, not feeling dizzy, still has a cough but no fevers overnight.  No chest pain or shortness of breath.  Objective:  Vital signs in last 24 hours: Vitals:   11/11/17 1315 11/11/17 2025 11/12/17 0037 11/12/17 0537  BP: (!) 190/89 (!) 160/90 (!) 160/80 (!) 160/90  Pulse:  82 76 74  Resp:  18 18 18   Temp:  98.6 F (37 C) 98.6 F (37 C) 98.7 F (37.1 C)  TempSrc:  Oral Oral Oral  SpO2:  97% 96% 97%  Weight:    103 lb (46.7 kg)  Height:       Physical Exam  Constitutional: She is oriented to person, place, and time. No distress.  HENT:  Mouth/Throat: Oropharynx is clear and moist. No oropharyngeal exudate.  Cardiovascular: Normal rate, regular rhythm and normal heart sounds. Exam reveals no gallop and no friction rub.  No murmur heard. Pulmonary/Chest: Effort normal. No respiratory distress. She has no wheezes. She has no rales.  Coarse upper airway breath sounds  Musculoskeletal: She exhibits no edema.  Lymphadenopathy:    She has no cervical adenopathy.  Neurological: She is alert and oriented to person, place, and time.  Skin: She is not diaphoretic.    Assessment/Plan:  Principal Problem:   Syncope Active Problems:   History of Stroke in 2016 without residual deficits (HCC)   Hypertension   Hypothyroidism   Other fatigue  Syncope: Patient presented after a syncopal episode preceded only by marked fatigue and weakness for 1 day. This is likely beta blocker induced fatigue/syncope given recent changed in medication including stopping losartan and starting Coreg. The last one which was increased from 12.5 mg QD to 25 mg BID. Has not been bradycardic during this admission. Telemetry reviewed this AM, lowest HR in high 50s. Range HR 56-80s. Workup including CBC, BMP, UA, and CXR unremarkable.  - telemetry reviewed no abnormalities - TTE looks great for a 84 year old, no pathology to explain syncope - PT/OT following  HTN: Patient was  hypertensive overnight with BP 218/101. She received one dose of IV metoprolol with improvement in BP.  - Increased home amlodipine 5mg  to 10mg  she is also and olmesartan-HCTZ 40-25 mg QD at home. On irbesartan-HCTZ 300 mg-25mg  QD while here.  - d/ced home coreg  Fever: Patient was febrile night of 3/30 with T 101.3 which improved with Tylenol, was asymptomatic. She denied symptoms concerning for infection on admission .  - Flu negative, does have cough but chest x-ray negative for pneumonia, no leukocytosis, likely has a viral URI  Normocytic anemia: She has a  history of anemia with baseline 10-12. CBC today showed Hgb 10.4 <--12 on admission. WBC and platelets also decreased compared to yesterday but normal. She has not received any IVF during this admission. ?Secondary to chronic disease given history of RA. No iron studies or B12 available to review.  - Continue home folic acid and iron  - CBC stable  Hypothyroidism: Last TSH 0.328 on 10/2014. TSH 2.2 3/30.  - Continue home Synthroid 75 mcg  Hx of CVA in 2016 with sequelae of seizures:  No residual deficits. Performs ADLs independently at home. Uses a walker at baseline and able to ambulate without difficulty.  - Continue home ASA 81 mg  - Continue home Depakote 500 mg BID    Dispo: Anticipated discharge in approximately 1 day(s).   11/2014, MD 11/12/2017, 6:34 AM 2017 MD PGY-1 Internal Medicine Pager #  [phone]  

## 2017-11-12 NOTE — Discharge Summary (Signed)
Name: Anna Mccoy MRN: [id number] DOB: January 25, 1925 84 y.o. PCP: [name], MD  Date of Admission: 11/10/2017  1:47 PM Date of Discharge: 11/12/2017 Attending Physician: No att. providers found  Discharge Diagnosis: 1.  Principal Problem:   Syncope Active Problems:   History of Stroke in 2016 without residual deficits (HCC)   Hypertension   Hypothyroidism   Other fatigue   Discharge Medications: Allergies as of 11/12/2017      Reactions   Ciprofloxacin Diarrhea   Lidocaine    Neosporin [neomycin-bacitracin Zn-polymyx]    Sulfa Antibiotics       Medication List    STOP taking these medications   carvedilol 12.5 MG tablet Commonly known as:  COREG     TAKE these medications   acetaminophen 500 MG tablet Commonly known as:  TYLENOL Take 500 mg by mouth as needed for mild pain.   amLODipine 10 MG tablet Commonly known as:  NORVASC Take 1 tablet (10 mg total) by mouth daily. What changed:    medication strength  how much to take   aspirin 81 MG tablet Take 81 mg by mouth daily.   atorvastatin 20 MG tablet Commonly known as:  LIPITOR Take 1 tablet (20 mg total) by mouth daily.   conjugated estrogens vaginal cream Commonly known as:  PREMARIN Place 1 Applicatorful vaginally 2 (two) times a week.   PREMARIN vaginal cream Generic drug:  conjugated estrogens APPLY VAGINALLY 3 TIMES A WEEK   divalproex 250 MG DR tablet Commonly known as:  DEPAKOTE Take 1 tablet (250 mg total) by mouth every 12 (twelve) hours.   fluorometholone 0.1 % ophthalmic suspension Commonly known as:  FML Place 1 drop into both eyes 4 (four) times daily.   folic acid 1 MG tablet Commonly known as:  FOLVITE Take 1 mg by mouth daily.   furosemide 20 MG tablet Commonly known as:  LASIX   leflunomide 20 MG tablet Commonly known as:  ARAVA Take 20 mg by mouth every other day.   levothyroxine 75 MCG tablet Commonly known as:  SYNTHROID, LEVOTHROID Take 75 mcg by mouth  daily before breakfast.   MIRALAX packet Generic drug:  polyethylene glycol Take 17 g by mouth as needed for mild constipation.   OCUVITE PRESERVISION PO Take 1 tablet by mouth 2 (two) times daily.   olmesartan-hydrochlorothiazide 40-25 MG tablet Commonly known as:  BENICAR HCT Take 1 tablet by mouth daily.   ORENCIA 250 MG injection Generic drug:  abatacept Inject into the vein every 30 (thirty) days.   POLY-IRON 150 150 MG capsule Generic drug:  iron polysaccharides Take 150 mg by mouth daily.   potassium chloride SA 20 MEQ tablet Commonly known as:  K-DUR,KLOR-CON Take 20 mEq by mouth 2 (two) times daily.   prednisoLONE acetate 1 % ophthalmic suspension Commonly known as:  PRED FORTE   primidone 50 MG tablet Commonly known as:  MYSOLINE Take 1 tablet by mouth 2 (two) times daily.   RESTASIS MULTIDOSE 0.05 % ophthalmic emulsion Generic drug:  cycloSPORINE Take 1 drop by mouth 2 (two) times daily.   SYSTANE 0.4-0.3 % Soln Generic drug:  Polyethyl Glycol-Propyl Glycol Place 1 drop into both eyes 3 (three) times daily.   triamcinolone ointment 0.5 % Commonly known as:  KENALOG APPLY THIN LAYER EXTERNALLY TO THE AFFECTED AREA TWICE DAILY   Vitamin D (Ergocalciferol) 50000 units Caps capsule Commonly known as:  DRISDOL Take 50,000 Units by mouth every 14 (fourteen) days.  Disposition and follow-up:   Anna Mccoy was discharged from [facility] in Good condition.  At the hospital follow up visit please address:  1.   Syncope HTN Hypothyroidism  -Ensure pt tolerating new bp regimen well and no further episodes of syncope   2.  Labs / imaging needed at time of follow-up: none  3.  Pending labs/ test needing follow-up: none  Follow-up Appointments: Follow-up Information    [facility] - Dme Follow up.   Why:  They will do your home health care at your home Contact information: [address] [place] [address] [phone]        [name], MD. Go on 11/22/2017.   Specialty:  Family Medicine Why:  Please follow up with your primary care doctor in the next 1-2 weeks@1 :40pm Contact information: [address] [place] [address] [phone]           Hospital Course by problem list: Principal Problem:   Syncope Active Problems:   History of Stroke in 2016 without residual deficits (HCC)   Hypertension   Hypothyroidism   Other fatigue   Syncope HTN Hypothyroidism  Patient arrived to the emergency department after an episode of syncope at home.  She had increased fatigue over the last several days prior to her arrival, she went and sat down and then passed out.  She had no tongue biting or incontinence.  She was recently taken off her home blood pressure meds losartan due to concerns with a recall.  This was followed by increasing her carvedilol dosage.  She reported not feeling well and more fatigued corresponding to that increase.  Her workup included a repeat TSH which was normal, her metabolic panels and CBC was normal other than a chronic normocytic anemia that is at her baseline.  We repeated an echocardiogram which showed no aortic stenosis or other valvular abnormalities that might explain her syncope.  Her EKG was sinus rhythm without ST changes and with a normal rate.  Her telemetry was reviewed during the duration of her stay and no abnormalities were noted.  Her carvedilol was held, her home losartan/HCTZ 40-25 was switched to irbesartan HCTZ while here inpatient due to it being on our formulary.  She developed a mild cough and this had one episode of fever during her stay to 101.  Chest x-ray was taken and demonstrated no acute cardiopulmonary process.  She had no increase in her white blood cell count.  She reported some nasal congestion as well and therefore this was attributed to a viral URI.  The patient had no further feelings of dizziness or syncopal  episodes, she was discharged with home health PT and a home health nurse to help with her decompensation and to keep a log of her blood pressures to assist her PCP and further alterations of her blood pressure management.      Discharge Vitals:   BP (!) 153/78 (BP Location: Right Arm)   Pulse 68   Temp 98.7 F (37.1 C) (Oral)   Resp 18   Ht 4\' 11"  (1.499 m) Comment: per pt  Wt 103 lb (46.7 kg) Comment: scale b  LMP 08/14/1981 (Approximate)   SpO2 97%   BMI 20.80 kg/m   Pertinent Labs, Studies, and Procedures:  CBC Latest Ref Rng & Units 11/12/2017 11/11/2017 11/10/2017  WBC 4.0 - 10.5 K/uL 6.4 5.7 6.5  Hemoglobin 12.0 - 15.0 g/dL 11.0(L) 10.4(L) 12.0  Hematocrit  36.0 - 46.0 % 32.6(L) 32.7(L) 36.7  Platelets 150 - 400 K/uL 168 163 171   BMP Latest Ref Rng & Units 11/11/2017 11/10/2017 08/18/2017  Glucose 65 - 99 mg/dL 79 89 85  BUN 6 - 20 mg/dL 8 10 14   Creatinine 0.44 - 1.00 mg/dL 7.82 9.56  BUN/Creat Ratio 12 - 28 - - -  Sodium 135 - 145 mmol/L 139 135 135  Potassium 3.5 - 5.1 mmol/L 3.7 3.9 4.2  Chloride 101 - 111 mmol/L 104 101 97(L)  CO2 22 - 32 mmol/L 25 25 28   Calcium 8.9 - 10.3 mg/dL 2.13) ) 9.4   Lab Results  Component Value Date   TSH 2.218 11/11/2017    ECHO  Study Conclusions   - Left ventricle: The cavity size was normal. Systolic function was   vigorous. The estimated ejection fraction was in the range of 65%   to 70%. Wall motion was normal; there were no regional wall   motion abnormalities. There was an increased relative   contribution of atrial contraction to ventricular filling.   Doppler parameters are consistent with abnormal left ventricular   relaxation (grade 1 diastolic dysfunction). - Aortic valve: Trileaflet; mildly thickened, mildly calcified   leaflets. - Tricuspid valve: There was mild-moderate regurgitation. - Pulmonic valve: There was trivial regurgitation. - Pulmonary arteries: PA peak pressure: 32 mm Hg (S).  Influenza A By  PCRNEGATIVENEGATIVE Influenza B By PCRNEGATIVENEGATIVE Comment: (NOTE)  The Xpert Xpress Flu assay is intended as an aid in the diagnosis of  influenza and should not be used as a sole basis for treatment.  This  assay is FDA approved for nasopharyngeal swab specimens only. Nasal  washings and aspirates are unacceptable for Xpert Xpress Flu testing.  Performed at [facility], [address], [place], [address]  [place]   Discharge Instructions: Discharge Instructions    Ambulatory referral to Home Health   Complete by:  As directed    Please evaluate CHRISTELLE IGOE for admission to [facility].  Disciplines requested: Nursing, Physical Therapy  Services to provide: Strengthening Exercises and Evaluate  Physician to follow patient's care (the person listed here will be responsible for signing ongoing orders): PCP  Requested Start of Care Date: Within 2-3 days  I certify that this patient is under my care and that I, or a Nurse Practitioner or Physician's Assistant working with me, had a face-to-face encounter that meets the physician face-to-face requirements with patient on 11/12/17. The encounter with the patient was in whole, or in part for the following medical condition(s) which is the primary reason for home health care Syncope, Hypertension,   Special Instructions:   Does the patient have Medicare or Medicaid?:  Yes   The encounter with the patient was in whole, or in part, for the following medical condition, which is the primary reason for home health care:  syncope   Reason for Medically Necessary Home Health Services:   Skilled Nursing- Changes in Medication/Medication Management Therapy- Gait Training, Transfer Training and Stair Training     My clinical findings support the need for the above services:   Unsafe ambulation due to balance issues Pain interferes with ambulation/mobility     I certify that, based on my findings, the following services are medically  necessary home health services:   Nursing Physical therapy     Further, I certify that my clinical findings support that this patient is homebound due to:   Unsafe ambulation due  to balance issues Pain interferes with ambulation/mobility     Diet - low sodium heart healthy   Complete by:  As directed    Discharge instructions   Complete by:  As directed    Mrs. Dromgoole, we have changed the way you are taking some of your blood pressure medicines.  Our hope is that this will keep you from passing out again.  You will need to follow up with your primary care doctor Dr. Andrey Campanile to recheck your blood pressure and for him to make sure you're not feeling dizzy or having any more syncopal episodes.  We believe your cough to possibly be a viral infection.  We do not believe you have a more serious infection like a pneumonia, your chest x-ray was clear and you are not presenting that way.  We have ordered home health nursing and physical therapy to come out to help you get back on your feet and monitor your blood pressure they should be arriving in 2-3 days.   Increase activity slowly   Complete by:  As directed       Signed: Angelita Ingles, MD 11/13/2017, 3:13 PM

## 2017-11-13 DIAGNOSIS — M5136 Other intervertebral disc degeneration, lumbar region: Secondary | ICD-10-CM | POA: Diagnosis not present

## 2017-11-13 DIAGNOSIS — M069 Rheumatoid arthritis, unspecified: Secondary | ICD-10-CM | POA: Diagnosis not present

## 2017-11-13 DIAGNOSIS — R55 Syncope and collapse: Secondary | ICD-10-CM | POA: Diagnosis not present

## 2017-11-13 DIAGNOSIS — Z7982 Long term (current) use of aspirin: Secondary | ICD-10-CM | POA: Diagnosis not present

## 2017-11-13 DIAGNOSIS — R569 Unspecified convulsions: Secondary | ICD-10-CM | POA: Diagnosis not present

## 2017-11-13 DIAGNOSIS — I10 Essential (primary) hypertension: Secondary | ICD-10-CM | POA: Diagnosis not present

## 2017-11-13 DIAGNOSIS — E785 Hyperlipidemia, unspecified: Secondary | ICD-10-CM | POA: Diagnosis not present

## 2017-11-13 DIAGNOSIS — Z79899 Other long term (current) drug therapy: Secondary | ICD-10-CM | POA: Diagnosis not present

## 2017-11-13 DIAGNOSIS — E039 Hypothyroidism, unspecified: Secondary | ICD-10-CM | POA: Diagnosis not present

## 2017-11-13 DIAGNOSIS — M1990 Unspecified osteoarthritis, unspecified site: Secondary | ICD-10-CM | POA: Diagnosis not present

## 2017-11-13 DIAGNOSIS — H3530 Unspecified macular degeneration: Secondary | ICD-10-CM | POA: Diagnosis not present

## 2017-11-13 DIAGNOSIS — I69398 Other sequelae of cerebral infarction: Secondary | ICD-10-CM | POA: Diagnosis not present

## 2017-11-13 NOTE — Consult Note (Signed)
            [facility] CM Primary Care Navigator  11/13/2017  Anna Mccoy 1925/07/27 [id number]   Went to seepatient at the bedsideto identify possible discharge needs but she was alreadydischarged.  Patient was discharged homeyesterday with home health services per therapy recommendation.  PerMD note, patient was admitted for syncopal episode likely secondary to medication changes and she was mildly hypertensive.  Primary care provider's office called(Debbie for Dr. Benedetto Goad) to notify of patient's discharge andneed for post hospital follow-up and transition of care (TOC). Notified of patient's health issues needing follow-up. Was informed that patient has an appointment schedule to follow-up with primary care provider on 11/22/17.  For additional questions please contact:  Karin Golden A. Rayshard Schirtzinger, BSN, RN-BC [facility] PRIMARY CARE Navigator Cell: [phone] ........................................................................................................................................Marland Kitchen

## 2017-11-14 DIAGNOSIS — M069 Rheumatoid arthritis, unspecified: Secondary | ICD-10-CM | POA: Diagnosis not present

## 2017-11-14 DIAGNOSIS — R55 Syncope and collapse: Secondary | ICD-10-CM | POA: Diagnosis not present

## 2017-11-14 DIAGNOSIS — R569 Unspecified convulsions: Secondary | ICD-10-CM | POA: Diagnosis not present

## 2017-11-14 DIAGNOSIS — I69398 Other sequelae of cerebral infarction: Secondary | ICD-10-CM | POA: Diagnosis not present

## 2017-11-14 DIAGNOSIS — R05 Cough: Secondary | ICD-10-CM | POA: Diagnosis not present

## 2017-11-14 DIAGNOSIS — I10 Essential (primary) hypertension: Secondary | ICD-10-CM | POA: Diagnosis not present

## 2017-11-14 DIAGNOSIS — M5136 Other intervertebral disc degeneration, lumbar region: Secondary | ICD-10-CM | POA: Diagnosis not present

## 2017-11-15 DIAGNOSIS — I10 Essential (primary) hypertension: Secondary | ICD-10-CM | POA: Diagnosis not present

## 2017-11-15 DIAGNOSIS — R569 Unspecified convulsions: Secondary | ICD-10-CM | POA: Diagnosis not present

## 2017-11-15 DIAGNOSIS — M069 Rheumatoid arthritis, unspecified: Secondary | ICD-10-CM | POA: Diagnosis not present

## 2017-11-15 DIAGNOSIS — M5136 Other intervertebral disc degeneration, lumbar region: Secondary | ICD-10-CM | POA: Diagnosis not present

## 2017-11-15 DIAGNOSIS — I69398 Other sequelae of cerebral infarction: Secondary | ICD-10-CM | POA: Diagnosis not present

## 2017-11-15 DIAGNOSIS — R55 Syncope and collapse: Secondary | ICD-10-CM | POA: Diagnosis not present

## 2017-11-19 DIAGNOSIS — M5136 Other intervertebral disc degeneration, lumbar region: Secondary | ICD-10-CM | POA: Diagnosis not present

## 2017-11-19 DIAGNOSIS — M069 Rheumatoid arthritis, unspecified: Secondary | ICD-10-CM | POA: Diagnosis not present

## 2017-11-19 DIAGNOSIS — I10 Essential (primary) hypertension: Secondary | ICD-10-CM | POA: Diagnosis not present

## 2017-11-19 DIAGNOSIS — R55 Syncope and collapse: Secondary | ICD-10-CM | POA: Diagnosis not present

## 2017-11-19 DIAGNOSIS — R569 Unspecified convulsions: Secondary | ICD-10-CM | POA: Diagnosis not present

## 2017-11-19 DIAGNOSIS — I69398 Other sequelae of cerebral infarction: Secondary | ICD-10-CM | POA: Diagnosis not present

## 2017-11-20 ENCOUNTER — Ambulatory Visit (INDEPENDENT_AMBULATORY_CARE_PROVIDER_SITE_OTHER): Payer: Medicare Other | Admitting: Certified Nurse Midwife

## 2017-11-20 ENCOUNTER — Encounter: Payer: Self-pay | Admitting: Certified Nurse Midwife

## 2017-11-20 ENCOUNTER — Other Ambulatory Visit: Payer: Self-pay

## 2017-11-20 VITALS — BP 122/82 | HR 70 | Resp 16 | Ht 59.0 in | Wt 107.0 lb

## 2017-11-20 DIAGNOSIS — Z4689 Encounter for fitting and adjustment of other specified devices: Secondary | ICD-10-CM

## 2017-11-20 DIAGNOSIS — I639 Cerebral infarction, unspecified: Secondary | ICD-10-CM | POA: Diagnosis not present

## 2017-11-20 DIAGNOSIS — N814 Uterovaginal prolapse, unspecified: Secondary | ICD-10-CM | POA: Diagnosis not present

## 2017-11-20 NOTE — Progress Notes (Signed)
Subjective:   84 y.o. Widowed Black female [id number] here for pessary check.  Patient has been using following pessary style and size:  #2 3/4" ring pessary.  She describes the following issues with the pessary:  None, denies vaginal bleeding or rectal bleeding. Had sent IFOB in and was negative. Had episode with fainting with low BP, was seen ER and medication has been reduced, will follow up with PCP soon. No other health issues today.   Use of protective clothing such as Depends or pads Yes.  . Problems with protective clothing with rash No..  Constipation issues with use of pessary No..  She is not sexually active.     ROS:   no breast pain or new or enlarging lumps on self exam,  no abnormal bleeding, pelvic pain or discharge, no dysuria, trouble voiding or hematuria   no dysuria, trouble voiding or hematuria. Compliant to use of vaginal cream Yes.   General Exam:    LMP 08/14/1981 (Approximate)   General appearance: alert, cooperative and appears stated age   Pelvic: External genitalia:  no lesions and atrophic appearance   Before pessary was removed No. prolapse over the pessary   In correct position No.              Urethra: normal appearing urethra with no masses, tenderness or lesions              Vagina: atrophic appearance, no change in discharge or vaginal bleeding. Cystocele grade 2 no change with mild rectocele noted..  There are No abrasions or ulcerations.               Cervix: normal appearance Cervical lesions were not found   Bimanual Exam:  Uterus:  Normal size,no tenderness or masses                               Adnexa:    normal adnexa in size, nontender and no masses                             Pessary was removed without difficulty without using forceps.  Pessary was cleansed with Betadine.  Pessary was replaced. Patient tolerated procedure well.    Assessment :  Normal exam, Cystocele- symptomatic, Rectocele- asymptomatic   Use of pessary continued   Recent  hospitalization for low blood pressure with medication change   Plan:     Discussed normal pelvic exam findings and no excoriations or ulcerations  Noted. Discussed importance of Premarin use to maintain moisture and tissue in vagina. questions addressed. Patient ambulated, sat and walked and felt pessary felt normal. Discussed need to recheck in 2 months  Instead of 3 months due to thinning tissue. Questions addressed.  Return to office in 2 months for recheck and prn         An After Visit Summary was printed and given to the patient.

## 2017-11-20 NOTE — Patient Instructions (Signed)
Call if vaginal bleeding or rectal bleeding. If unable to void needs call.

## 2017-11-21 DIAGNOSIS — I69398 Other sequelae of cerebral infarction: Secondary | ICD-10-CM | POA: Diagnosis not present

## 2017-11-21 DIAGNOSIS — I10 Essential (primary) hypertension: Secondary | ICD-10-CM | POA: Diagnosis not present

## 2017-11-21 DIAGNOSIS — M069 Rheumatoid arthritis, unspecified: Secondary | ICD-10-CM | POA: Diagnosis not present

## 2017-11-21 DIAGNOSIS — R55 Syncope and collapse: Secondary | ICD-10-CM | POA: Diagnosis not present

## 2017-11-21 DIAGNOSIS — R569 Unspecified convulsions: Secondary | ICD-10-CM | POA: Diagnosis not present

## 2017-11-21 DIAGNOSIS — M5136 Other intervertebral disc degeneration, lumbar region: Secondary | ICD-10-CM | POA: Diagnosis not present

## 2017-11-22 DIAGNOSIS — Z09 Encounter for follow-up examination after completed treatment for conditions other than malignant neoplasm: Secondary | ICD-10-CM | POA: Diagnosis not present

## 2017-11-23 DIAGNOSIS — I10 Essential (primary) hypertension: Secondary | ICD-10-CM | POA: Diagnosis not present

## 2017-11-23 DIAGNOSIS — R55 Syncope and collapse: Secondary | ICD-10-CM | POA: Diagnosis not present

## 2017-11-23 DIAGNOSIS — M069 Rheumatoid arthritis, unspecified: Secondary | ICD-10-CM | POA: Diagnosis not present

## 2017-11-23 DIAGNOSIS — R569 Unspecified convulsions: Secondary | ICD-10-CM | POA: Diagnosis not present

## 2017-11-23 DIAGNOSIS — M5136 Other intervertebral disc degeneration, lumbar region: Secondary | ICD-10-CM | POA: Diagnosis not present

## 2017-11-23 DIAGNOSIS — I69398 Other sequelae of cerebral infarction: Secondary | ICD-10-CM | POA: Diagnosis not present

## 2017-11-26 DIAGNOSIS — M069 Rheumatoid arthritis, unspecified: Secondary | ICD-10-CM | POA: Diagnosis not present

## 2017-11-26 DIAGNOSIS — I69398 Other sequelae of cerebral infarction: Secondary | ICD-10-CM | POA: Diagnosis not present

## 2017-11-26 DIAGNOSIS — R55 Syncope and collapse: Secondary | ICD-10-CM | POA: Diagnosis not present

## 2017-11-26 DIAGNOSIS — R569 Unspecified convulsions: Secondary | ICD-10-CM | POA: Diagnosis not present

## 2017-11-26 DIAGNOSIS — M5136 Other intervertebral disc degeneration, lumbar region: Secondary | ICD-10-CM | POA: Diagnosis not present

## 2017-11-26 DIAGNOSIS — I10 Essential (primary) hypertension: Secondary | ICD-10-CM | POA: Diagnosis not present

## 2017-11-28 DIAGNOSIS — R55 Syncope and collapse: Secondary | ICD-10-CM | POA: Diagnosis not present

## 2017-11-28 DIAGNOSIS — I69398 Other sequelae of cerebral infarction: Secondary | ICD-10-CM | POA: Diagnosis not present

## 2017-11-28 DIAGNOSIS — R569 Unspecified convulsions: Secondary | ICD-10-CM | POA: Diagnosis not present

## 2017-11-28 DIAGNOSIS — M5136 Other intervertebral disc degeneration, lumbar region: Secondary | ICD-10-CM | POA: Diagnosis not present

## 2017-11-28 DIAGNOSIS — I10 Essential (primary) hypertension: Secondary | ICD-10-CM | POA: Diagnosis not present

## 2017-11-28 DIAGNOSIS — M069 Rheumatoid arthritis, unspecified: Secondary | ICD-10-CM | POA: Diagnosis not present

## 2017-11-30 DIAGNOSIS — M5136 Other intervertebral disc degeneration, lumbar region: Secondary | ICD-10-CM | POA: Diagnosis not present

## 2017-11-30 DIAGNOSIS — M069 Rheumatoid arthritis, unspecified: Secondary | ICD-10-CM | POA: Diagnosis not present

## 2017-11-30 DIAGNOSIS — I10 Essential (primary) hypertension: Secondary | ICD-10-CM | POA: Diagnosis not present

## 2017-11-30 DIAGNOSIS — R569 Unspecified convulsions: Secondary | ICD-10-CM | POA: Diagnosis not present

## 2017-11-30 DIAGNOSIS — R55 Syncope and collapse: Secondary | ICD-10-CM | POA: Diagnosis not present

## 2017-11-30 DIAGNOSIS — I69398 Other sequelae of cerebral infarction: Secondary | ICD-10-CM | POA: Diagnosis not present

## 2017-12-02 DIAGNOSIS — Z09 Encounter for follow-up examination after completed treatment for conditions other than malignant neoplasm: Secondary | ICD-10-CM | POA: Insufficient documentation

## 2017-12-03 DIAGNOSIS — M4800 Spinal stenosis, site unspecified: Secondary | ICD-10-CM | POA: Diagnosis not present

## 2017-12-03 DIAGNOSIS — Z79899 Other long term (current) drug therapy: Secondary | ICD-10-CM | POA: Diagnosis not present

## 2017-12-03 DIAGNOSIS — M5136 Other intervertebral disc degeneration, lumbar region: Secondary | ICD-10-CM | POA: Diagnosis not present

## 2017-12-03 DIAGNOSIS — M150 Primary generalized (osteo)arthritis: Secondary | ICD-10-CM | POA: Diagnosis not present

## 2017-12-03 DIAGNOSIS — M7062 Trochanteric bursitis, left hip: Secondary | ICD-10-CM | POA: Diagnosis not present

## 2017-12-03 DIAGNOSIS — M0589 Other rheumatoid arthritis with rheumatoid factor of multiple sites: Secondary | ICD-10-CM | POA: Diagnosis not present

## 2017-12-04 DIAGNOSIS — H01003 Unspecified blepharitis right eye, unspecified eyelid: Secondary | ICD-10-CM | POA: Diagnosis not present

## 2017-12-04 DIAGNOSIS — H1013 Acute atopic conjunctivitis, bilateral: Secondary | ICD-10-CM | POA: Diagnosis not present

## 2017-12-04 DIAGNOSIS — H16223 Keratoconjunctivitis sicca, not specified as Sjogren's, bilateral: Secondary | ICD-10-CM | POA: Diagnosis not present

## 2017-12-04 DIAGNOSIS — H04123 Dry eye syndrome of bilateral lacrimal glands: Secondary | ICD-10-CM | POA: Diagnosis not present

## 2017-12-06 DIAGNOSIS — M0589 Other rheumatoid arthritis with rheumatoid factor of multiple sites: Secondary | ICD-10-CM | POA: Diagnosis not present

## 2017-12-10 DIAGNOSIS — R55 Syncope and collapse: Secondary | ICD-10-CM | POA: Diagnosis not present

## 2017-12-10 DIAGNOSIS — I69398 Other sequelae of cerebral infarction: Secondary | ICD-10-CM | POA: Diagnosis not present

## 2017-12-10 DIAGNOSIS — R569 Unspecified convulsions: Secondary | ICD-10-CM | POA: Diagnosis not present

## 2017-12-10 DIAGNOSIS — I10 Essential (primary) hypertension: Secondary | ICD-10-CM | POA: Diagnosis not present

## 2017-12-10 DIAGNOSIS — M069 Rheumatoid arthritis, unspecified: Secondary | ICD-10-CM | POA: Diagnosis not present

## 2017-12-10 DIAGNOSIS — M5136 Other intervertebral disc degeneration, lumbar region: Secondary | ICD-10-CM | POA: Diagnosis not present

## 2017-12-11 DIAGNOSIS — R569 Unspecified convulsions: Secondary | ICD-10-CM | POA: Diagnosis not present

## 2017-12-11 DIAGNOSIS — I10 Essential (primary) hypertension: Secondary | ICD-10-CM | POA: Diagnosis not present

## 2017-12-11 DIAGNOSIS — M069 Rheumatoid arthritis, unspecified: Secondary | ICD-10-CM | POA: Diagnosis not present

## 2017-12-11 DIAGNOSIS — M5136 Other intervertebral disc degeneration, lumbar region: Secondary | ICD-10-CM | POA: Diagnosis not present

## 2017-12-11 DIAGNOSIS — I69398 Other sequelae of cerebral infarction: Secondary | ICD-10-CM | POA: Diagnosis not present

## 2017-12-11 DIAGNOSIS — R55 Syncope and collapse: Secondary | ICD-10-CM | POA: Diagnosis not present

## 2017-12-26 DIAGNOSIS — R55 Syncope and collapse: Secondary | ICD-10-CM | POA: Diagnosis not present

## 2017-12-26 DIAGNOSIS — I69398 Other sequelae of cerebral infarction: Secondary | ICD-10-CM | POA: Diagnosis not present

## 2017-12-26 DIAGNOSIS — M069 Rheumatoid arthritis, unspecified: Secondary | ICD-10-CM | POA: Diagnosis not present

## 2017-12-26 DIAGNOSIS — R569 Unspecified convulsions: Secondary | ICD-10-CM | POA: Diagnosis not present

## 2017-12-26 DIAGNOSIS — M5136 Other intervertebral disc degeneration, lumbar region: Secondary | ICD-10-CM | POA: Diagnosis not present

## 2017-12-26 DIAGNOSIS — I10 Essential (primary) hypertension: Secondary | ICD-10-CM | POA: Diagnosis not present

## 2018-01-03 ENCOUNTER — Telehealth: Payer: Self-pay | Admitting: Neurology

## 2018-01-03 DIAGNOSIS — H6121 Impacted cerumen, right ear: Secondary | ICD-10-CM | POA: Diagnosis not present

## 2018-01-03 NOTE — Telephone Encounter (Addendum)
Pts daughter([name]) called requesting a call back to discuss a couple "incidients " the pt had. [name] stating that Sunday while getting ready for church the pts legs gave out completley causing her to collapse onto her daughter to catch her. Then yesterday 5/22 the pt had an intense ear pain and "nosie" in her head. Pt took Advil to maintain the pain. Later in the day she heard a pop in that ear and saw there had been crust on her ear. [name] states the last time something like this happened it led to the pts stroke. Please call to advise.

## 2018-01-03 NOTE — Telephone Encounter (Signed)
Rn call Anna Mccoy about the recommendations from Anna Mccoy stroke NP for Anna Mccoy. Rn stated Anna Mccoy is out of the office this week, and is not available. Rn stated Anna Bumps NP recommend she seeks PCP for the legs giving out on Sunday, and her ear issue, which could be respiratory virus to be evaluated. The daughter stated she call her moms PCP to schedule an appointment this am at 1100. She call our office too because she did not know how long it would take for [place] to call back. Anna Mccoy stated her mom did have a syncope episode in 10/2017,and her moms medications had to be adjusted. The daughter knows her mom has arthritis, and was not concern till the symptoms of popping in the ear occurred, and she was having pain. The pt took a aleve and it did alleviate some pain. The daughter stated her mom had a stroke in 2016 and thinks it could be a TIA but is not sure. She has a audio from the MD in 2016 where the doctor discuss stroke. She did get permission from the MD in 2016. Rn stated if she felt her mom had a stroke they need to seek the nearest ED for work up. The daughter stated she will see what Anna Mccoy states If he feels the mom needs a MRI. The daughter again appreciate the call,and verbalized understanding.

## 2018-01-03 NOTE — Telephone Encounter (Signed)
Please inform patients daughter that she needs to contact her PCP for these acute issues to be worked up. Per notes, she was seen in the ED on 11/10/17 for syncopal episodes and BP meds adjusted at that time. She also had c/o upper respiratory virus/infection. Please advise to call PCP today to discuss these issues. Thank you.

## 2018-01-04 ENCOUNTER — Ambulatory Visit: Payer: Medicare Other | Admitting: Cardiology

## 2018-01-05 DIAGNOSIS — H6120 Impacted cerumen, unspecified ear: Secondary | ICD-10-CM | POA: Diagnosis not present

## 2018-01-05 DIAGNOSIS — R5383 Other fatigue: Secondary | ICD-10-CM | POA: Diagnosis not present

## 2018-01-09 DIAGNOSIS — M0589 Other rheumatoid arthritis with rheumatoid factor of multiple sites: Secondary | ICD-10-CM | POA: Diagnosis not present

## 2018-01-10 DIAGNOSIS — R55 Syncope and collapse: Secondary | ICD-10-CM | POA: Diagnosis not present

## 2018-01-10 DIAGNOSIS — M069 Rheumatoid arthritis, unspecified: Secondary | ICD-10-CM | POA: Diagnosis not present

## 2018-01-10 DIAGNOSIS — H1013 Acute atopic conjunctivitis, bilateral: Secondary | ICD-10-CM | POA: Diagnosis not present

## 2018-01-10 DIAGNOSIS — H16223 Keratoconjunctivitis sicca, not specified as Sjogren's, bilateral: Secondary | ICD-10-CM | POA: Diagnosis not present

## 2018-01-10 DIAGNOSIS — H6121 Impacted cerumen, right ear: Secondary | ICD-10-CM

## 2018-01-10 DIAGNOSIS — H6123 Impacted cerumen, bilateral: Secondary | ICD-10-CM | POA: Insufficient documentation

## 2018-01-10 DIAGNOSIS — H04123 Dry eye syndrome of bilateral lacrimal glands: Secondary | ICD-10-CM | POA: Diagnosis not present

## 2018-01-10 DIAGNOSIS — I10 Essential (primary) hypertension: Secondary | ICD-10-CM | POA: Diagnosis not present

## 2018-01-10 DIAGNOSIS — M5136 Other intervertebral disc degeneration, lumbar region: Secondary | ICD-10-CM | POA: Diagnosis not present

## 2018-01-10 DIAGNOSIS — H01003 Unspecified blepharitis right eye, unspecified eyelid: Secondary | ICD-10-CM | POA: Diagnosis not present

## 2018-01-10 DIAGNOSIS — R569 Unspecified convulsions: Secondary | ICD-10-CM | POA: Diagnosis not present

## 2018-01-10 DIAGNOSIS — I69398 Other sequelae of cerebral infarction: Secondary | ICD-10-CM | POA: Diagnosis not present

## 2018-01-10 HISTORY — DX: Impacted cerumen, right ear: H61.21

## 2018-01-14 DIAGNOSIS — H6981 Other specified disorders of Eustachian tube, right ear: Secondary | ICD-10-CM | POA: Diagnosis not present

## 2018-01-18 DIAGNOSIS — H9121 Sudden idiopathic hearing loss, right ear: Secondary | ICD-10-CM

## 2018-01-18 HISTORY — DX: Sudden idiopathic hearing loss, right ear: H91.21

## 2018-01-22 ENCOUNTER — Other Ambulatory Visit: Payer: Self-pay

## 2018-01-22 ENCOUNTER — Ambulatory Visit (INDEPENDENT_AMBULATORY_CARE_PROVIDER_SITE_OTHER): Payer: Medicare Other | Admitting: Certified Nurse Midwife

## 2018-01-22 ENCOUNTER — Encounter: Payer: Self-pay | Admitting: Certified Nurse Midwife

## 2018-01-22 VITALS — BP 144/80 | HR 70 | Resp 16 | Ht 59.0 in | Wt 110.0 lb

## 2018-01-22 DIAGNOSIS — N816 Rectocele: Secondary | ICD-10-CM

## 2018-01-22 DIAGNOSIS — N8110 Cystocele, unspecified: Secondary | ICD-10-CM

## 2018-01-22 DIAGNOSIS — I639 Cerebral infarction, unspecified: Secondary | ICD-10-CM | POA: Diagnosis not present

## 2018-01-22 DIAGNOSIS — Z4689 Encounter for fitting and adjustment of other specified devices: Secondary | ICD-10-CM

## 2018-01-22 NOTE — Patient Instructions (Signed)
Call if vaginal bleeding or pain or rectal bleeding. Will need to be seen.

## 2018-01-22 NOTE — Progress Notes (Signed)
Subjective:   84 y.o. Widowed Black female [id number] here for pessary check.  Patient has been using following pessary style and size:   # 2 3/4" ring pessary for cystocele support and rectocele support..  She describes the following issues with the pessary:  None.  Patient walks with walker and is now having vision and hearing issues in one ear. Daughter brings for every appointment. Patient cares for self with minimal assistance. Denies any breast issues or urinary issues. No other health issues today.   Use of protective clothing such as Depends or pads Yes.  . Problems with protective clothing with rash No..  Constipation issues with use of pessary No..  She is not sexually active.     ROS:   no breast pain or new or enlarging lumps on self exam,  no abnormal bleeding, pelvic pain or discharge, no dysuria, trouble voiding or hematuria   no dysuria, trouble voiding or hematuria. Compliant to use of vaginal cream Yes.   General Exam:    BP (!) 144/80   Pulse 70   Resp 16   Ht 4\' 11"  (1.499 m)   Wt 110 lb (49.9 kg)   LMP 08/14/1981 (Approximate)   BMI 22.22 kg/m   General appearance: alert, cooperative and appears stated age   Pelvic: External genitalia:  atrophic appearance,  No scaling or lesions noted   Before pessary was removed No. prolapse over the pessary   In correct position Yes.                Urethra: normal appearing urethra with no masses, tenderness or lesions and prominent              Vagina: atrophic appearing vagina with no redness or changes. Coconut oil residue noted..  There are No abrasions or ulcerations.               Cervix: normal appearance and non tender Cervical lesions were not found   Bimanual Exam:  Uterus:  small non tender"uterus is normal size, shape, consistency and nontender"}                               Adnexa:    normal adnexa in size, nontender and no masses                             Pessary was removed without difficulty without using  forceps.  Pessary was cleansed with Betadine.  Pessary was replaced. Patient tolerated procedure well.     Assessment :                Cystocele/rectocele with pessary support. Normal pelvic exam Atrophic vaginitis using coconut oil for moisture Vision and hearing declining   Plan:     Discussed  Normal pelvic exam noted and needs to advise if any vaginal bleeding, unable to empty bladder or bowels. Will need to use coconut oil 3 times weekly for moisture. Questions addressed.  Return to office in 2 months for recheck and prn.           An After Visit Summary was printed and given to the patient.

## 2018-02-05 DIAGNOSIS — M0589 Other rheumatoid arthritis with rheumatoid factor of multiple sites: Secondary | ICD-10-CM | POA: Diagnosis not present

## 2018-02-06 DIAGNOSIS — H6991 Unspecified Eustachian tube disorder, right ear: Secondary | ICD-10-CM | POA: Insufficient documentation

## 2018-02-06 DIAGNOSIS — H9113 Presbycusis, bilateral: Secondary | ICD-10-CM | POA: Diagnosis not present

## 2018-02-06 DIAGNOSIS — H906 Mixed conductive and sensorineural hearing loss, bilateral: Secondary | ICD-10-CM | POA: Diagnosis not present

## 2018-02-06 DIAGNOSIS — H6981 Other specified disorders of Eustachian tube, right ear: Secondary | ICD-10-CM | POA: Insufficient documentation

## 2018-02-06 DIAGNOSIS — H6123 Impacted cerumen, bilateral: Secondary | ICD-10-CM | POA: Diagnosis not present

## 2018-02-12 ENCOUNTER — Telehealth: Payer: Self-pay | Admitting: Certified Nurse Midwife

## 2018-02-12 NOTE — Telephone Encounter (Signed)
Spoke with patients daughter Talbert Forest, ok per dpr. Reports "red" blood in stool with BM this morning. No straining, describes as normal BM, abdomen soft. No known hemorrhoids. Pessary in place.    Denies pain, N/V, fever/chills, vaginal bleeding, vag d/c, odor, urinary symptoms.   Requesting OV. OV scheduled for 02/13/18 at 2pm with Leota Sauers, CNM. Daughter verbalizes understanding.  Routing to provider for final review. Patient is agreeable to disposition. Will close encounter.

## 2018-02-12 NOTE — Telephone Encounter (Signed)
Patient's daughter, Anna Mccoy, calling on behalf of [name]. States that her mother noticed blood in her stool. Can be reached at [phone].

## 2018-02-13 ENCOUNTER — Ambulatory Visit (INDEPENDENT_AMBULATORY_CARE_PROVIDER_SITE_OTHER): Payer: Medicare Other | Admitting: Certified Nurse Midwife

## 2018-02-13 ENCOUNTER — Other Ambulatory Visit: Payer: Self-pay

## 2018-02-13 ENCOUNTER — Encounter: Payer: Self-pay | Admitting: Certified Nurse Midwife

## 2018-02-13 VITALS — BP 144/90 | HR 68 | Resp 16 | Ht 59.0 in | Wt 110.0 lb

## 2018-02-13 DIAGNOSIS — Z4689 Encounter for fitting and adjustment of other specified devices: Secondary | ICD-10-CM

## 2018-02-13 DIAGNOSIS — I639 Cerebral infarction, unspecified: Secondary | ICD-10-CM | POA: Diagnosis not present

## 2018-02-13 DIAGNOSIS — K625 Hemorrhage of anus and rectum: Secondary | ICD-10-CM | POA: Insufficient documentation

## 2018-02-13 DIAGNOSIS — Z8719 Personal history of other diseases of the digestive system: Secondary | ICD-10-CM | POA: Diagnosis not present

## 2018-02-13 NOTE — Progress Notes (Signed)
  Subjective:     Patient ID: Anna Mccoy, female   DOB: 12-01-1924, 84 y.o.   MRN: [id number]  Patient here complaining of blood on stool and in toilet for the past few days.Marland Kitchen Has had some  constipation., in the past few days and feels this may be cause. Denies hemorrhoid that she feel, but has had in past. Denies any vaginal bleeding or problems with pessary. Continues to put coconut oil in vagina for dryness and with pessary use. Has no pain with moving bowels, no black tarry stools, that she has noticed..Eating well and trying to drink enough water. Came in because she was told to call if any changes with bowel or pessary. Daughter with patient and was not aware this was occurring until patient called her. No other problems today.    Review of Systems  Constitutional: Negative for activity change, appetite change and chills.  Gastrointestinal: Positive for anal bleeding and constipation. Negative for abdominal distention, abdominal pain, diarrhea, nausea, rectal pain and vomiting.  Genitourinary: Positive for vaginal pain. Negative for difficulty urinating, genital sores, pelvic pain, urgency, vaginal bleeding and vaginal discharge.  Skin: Negative.   Psychiatric/Behavioral: The patient is not nervous/anxious.        Objective:   Physical Exam  Constitutional: She is oriented to person, place, and time. She appears well-developed and well-nourished.  Abdominal: Soft. Bowel sounds are normal. She exhibits no distension. There is no tenderness. There is no rebound and no guarding.  Genitourinary: Vagina normal and uterus normal. Rectal exam shows no external hemorrhoid, no fissure, no mass and no tenderness. Pelvic exam was performed with patient supine. There is no rash, tenderness or lesion on the right labia. There is no rash, tenderness or lesion on the left labia. Cervix exhibits no motion tenderness and no friability. Right adnexum displays no mass, no tenderness and no fullness.  Left adnexum displays no mass, no tenderness and no fullness. No tenderness or bleeding in the vagina. No signs of injury around the vagina. No vaginal discharge found.  Genitourinary Comments: q tip touched to anal opening with very faint pink blood noted Anal scope inserted gently with no active bleeding noted, but tissue appeared inflamed. Non tender with q tip touch. No internal hemorrhoid noted. Pessary removed to see if this was contributing to constipation. No excoriation or ulceration noted with inspection. Non tender and no bleeding noted. Pessary was  Not reinserted.  Lymphadenopathy: No inguinal adenopathy noted on the right or left side.  Neurological: She is alert and oriented to person, place, and time.  Skin: Skin is warm and dry.  Psychiatric: She has a normal mood and affect. Her behavior is normal. Judgment and thought content normal.       Assessment:     Rectal bleeding internal ? site Pessary use for vaginal support, normal surveillance, removed and not re-inserted. Constipation     Plan:    Discussed finding with patient and daughter and need to have evaluation with Dr Loreta Ave GI. Patient has seen her previously and is comfortable with care. Appointment will scheduled prior to leaving.  Patient has appointment on 02/19/18. Daughter has information regarding. Warning signs of rectal bleeding reviewed and need to advise or seek ER if excessive. Questions addressed. Discussed pessary not reinserted and will keep in office and re insert once this problem is resolved.  Rv prn

## 2018-02-13 NOTE — Patient Instructions (Signed)
Rectal Bleeding °Rectal bleeding is when blood comes out of the opening of the butt (anus). People with this kind of bleeding may notice bright red blood in their underwear or in the toilet after they poop (have a bowel movement). They may also have dark red or black poop (stool). Rectal bleeding is often a sign that something is wrong. It needs to be checked by a doctor. °Follow these instructions at home: °Watch for any changes in your condition. Take these actions to help with bleeding and discomfort: °· Eat a diet that is high in fiber. This will keep your poop soft so it is easier for you to poop without pushing too hard. Ask your doctor to tell you what foods and drinks are high in fiber. °· Drink enough fluid to keep your pee (urine) clear or pale yellow. This also helps keep your poop soft. °· Try taking a warm bath. This may help with pain. °· Keep all follow-up visits as told by your doctor. This is important. ° °Get help right away if: °· You have new bleeding. °· You have more bleeding than before. °· You have black or dark red poop. °· You throw up (vomit) blood or something that looks like coffee grounds. °· You have pain or tenderness in your belly (abdomen). °· You have a fever. °· You feel weak. °· You feel sick to your stomach (nauseous). °· You pass out (faint). °· You have very bad pain in your butt. °· You cannot poop. °This information is not intended to replace advice given to you by your health care provider. Make sure you discuss any questions you have with your health care provider. °Document Released: 04/12/2011 Document Revised: 01/06/2016 Document Reviewed: 09/26/2015 °Elsevier Interactive Patient Education © 2018 [employer] ° °

## 2018-02-13 NOTE — Progress Notes (Signed)
Following appointment, consult scheduled with Dr Loreta Ave for Tuesday, 02-19-18 at 9am. Patients daughter notified of appointment by Wendelyn Breslow. Phone number provided for adjustment of appointment if desired.

## 2018-02-17 ENCOUNTER — Encounter: Payer: Self-pay | Admitting: Physician Assistant

## 2018-02-17 NOTE — Progress Notes (Signed)
Cardiology Office Note    Date:  02/18/2018  ID:  Anna Mccoy, DOB 12-15-1924, MRN [id number] PCP:  [name], MD  Cardiologist:  Tobias Alexander, MD   Chief Complaint: f/u BP, syncope, CHF  History of Present Illness:  Anna Mccoy is a 84 y.o. female with history of HTN, stroke/TIA, edema/SOB felt possibly r/t chronic diastolic CHF, rheumatoid arthritis, hypothyroidism, RBBB, chronic anemia, PVCs by prior EKG who presents for f/u of CHF. She previously did not tolerate Lasix due to frequent urination. BNP 12/2015 was normal. Last OV 08/2017 notable for some confusion about her medications. BP was running high, had eaten salty gumbo. She was otherwise felt well compensated. Coreg had been recently started by PCP. Last labs - 11/2017 Hgb 11.0, 10/2017 K 3.7, Cr 0.71, TSH wnl, albumin 3.2, AST/ALT OK. Last echo 10/2017 showed EF 65-70%, grade 1 DD, mildly thickened AV, mild-mod TR, PASP 32. This was done for syncope. She had increased fatigue for several days. Her daughter saw she had sat down after breakfast and called out to her but she didn't respond but was breathing. EMS was called and by that time she was coming around but daughter reports BP was low. There was no clear explanation that I can see from DC summary. Carvedilol had recently been increased to 25mg  BID for high blood pressure. Tele reported to show NSR with lowest HR 50s. Carvedilol was discontinued and amlodipine was increased.  She presents back for follow-up today with her daughter. Since BP meds were changed, no recurrent syncope. No CP, SOB. Mild edema reported by patient but minimal on exam. Very sedentary, generally tired all the time. Ambulates slowly with walker. Recently saw OB/GYN reporting intermittent rectal bleeding so was referred to GI. She continues to report intermittent rectal bleeding.    Past Medical History:  Diagnosis Date  . Anemia   . Arthritis   . Cerebral infarction due to unspecified  mechanism   . 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 Chronic diarrhea of unknown origin 12/04/2015  . Chronic diastolic CHF (congestive heart failure) (HCC)   . CVA (cerebral infarction) 02/02/2015  . DDD (degenerative disc disease), lumbar   . Edema   . Essential tremor 09/29/2015  . Gait disturbance 06/06/2017  . Hx-TIA (transient ischemic attack)   . Hypertension   . Hypertensive heart disease with heart failure (HCC) 01/13/2016  . Hypertrophic toenail 09/28/2015  . Hypothyroidism    HYPOTHYROIDISM  . Impacted cerumen of right ear 01/10/2018  . Kyphosis (acquired) (postural) 10/07/2016  . Menopausal state age 69  . Mitral valve regurgitation    TRIVIAL  . Osteoporosis 09/28/2015  . Peripheral neuropathy 09/28/2015  . Post-menopausal bleeding 02/2011   Endo Biopsy 7/12 benign, no hyperplasia  . Procidentia of uterus    uses pessary  . PVC's (premature ventricular contractions)   . RBBB   . Rheumatoid arthritis(714.0)   . Seizure cerebral   . Sudden idiopathic hearing loss of right ear 01/18/2018  . Syncope 11/10/2017  . Vitamin D deficiency disease     Past Surgical History:  Procedure Laterality Date  . CHOLECYSTECTOMY, LAPAROSCOPIC  2002  . COLONOSCOPY W/ BIOPSIES  6/09   sigmoid diverticuli recheck 5 years  . EYE SURGERY  04/24/14   Cataract sx in right eye with lens replacement.  Marland Kitchen LAMINECTOMY AND MICRODISCECTOMY LUMBAR SPINE  01/23/09  . VAGINAL DELIVERY     x5    Current Medications: Current Meds  Medication Sig  . abatacept (ORENCIA) 250 MG injection Inject into the vein every 30 (thirty) days.   Marland Kitchen acetaminophen (TYLENOL) 500 MG tablet Take 500 mg by mouth as needed for mild pain.   Marland Kitchen amLODipine (NORVASC) 10 MG tablet TAKE 1 TABLET(5 MG) BY MOUTH DAILY FOR  BLOOD PRESSURE CONTROL  . aspirin 81 MG tablet Take 81 mg by mouth daily.   Marland Kitchen atorvastatin (LIPITOR) 20 MG tablet Take 20 mg by mouth daily.  . divalproex (DEPAKOTE) 250 MG DR tablet Take 1 tablet  (250 mg total) by mouth every 12 (twelve) hours.  . fluorometholone (FML) 0.1 % ophthalmic suspension Place 1 drop into both eyes 4 (four) times daily.  . fluticasone (FLONASE) 50 MCG/ACT nasal spray SHAKE LIQUID AND USE 1 SPRAY IN EACH NOSTRIL TWICE DAILY  . folic acid (FOLVITE) 1 MG tablet Take 1 mg by mouth daily.  . furosemide (LASIX) 20 MG tablet   . leflunomide (ARAVA) 20 MG tablet Take 20 mg by mouth every other day.  . levothyroxine (SYNTHROID, LEVOTHROID) 75 MCG tablet Take 75 mcg by mouth daily before breakfast. Unsure if patient is taking 75 or  . Multiple Vitamins-Minerals (OCUVITE PRESERVISION PO) Take 1 tablet by mouth 2 (two) times daily.   Marland Kitchen olmesartan-hydrochlorothiazide (BENICAR HCT) 40-25 MG tablet Take 1 tablet by mouth daily.  Marland Kitchen POLY-IRON 150 150 MG capsule Take 150 mg by mouth daily.   Bertram Gala Glycol-Propyl Glycol (SYSTANE) 0.4-0.3 % SOLN Place 1 drop into both eyes 3 (three) times daily.   . polyethylene glycol (MIRALAX) packet Take 17 g by mouth as needed for mild constipation.  . potassium chloride SA (K-DUR,KLOR-CON) 20 MEQ tablet Take 20 mEq by mouth 2 (two) times daily.  Marland Kitchen PREMARIN vaginal cream APPLY VAGINALLY 3 TIMES A WEEK  . primidone (MYSOLINE) 50 MG tablet Take 1 tablet by mouth 2 (two) times daily.   . RESTASIS MULTIDOSE 0.05 % ophthalmic emulsion Take 1 drop by mouth 2 (two) times daily.  Marland Kitchen UNABLE TO FIND Refresh for ears  . UNABLE TO FIND Ultra flora     Allergies:   Ciprofloxacin; Lidocaine; Neosporin [neomycin-bacitracin zn-polymyx]; and Sulfa antibiotics   Social History   Socioeconomic History  . Marital status: Widowed    Spouse name: Not on file  . Number of children: 5  . Years of education: 50  . Highest education level: Not on file  Occupational History  . Occupation: Retired   Engineer, production  . Financial resource strain: Not on file  . Food insecurity:    Worry: Not on file    Inability: Not on file  . Transportation needs:      Medical: Not on file    Non-medical: Not on file  Tobacco Use  . Smoking status: Never Smoker  . Smokeless tobacco: Never Used  Substance and Sexual Activity  . Alcohol use: No  . Drug use: No  . Sexual activity: Not Currently    Comment: Widowed 01/10/07 age 37  Lifestyle  . Physical activity:    Days per week: Not on file    Minutes per session: Not on file  . Stress: Not on file  Relationships  . Social connections:    Talks on phone: Not on file    Gets together: Not on file    Attends religious service: Not on file    Active member of club or organization: Not on file    Attends meetings of clubs or organizations: Not  on file    Relationship status: Not on file  Other Topics Concern  . Not on file  Social History Narrative   Patient is right handed, resides with daughter and sister.    Caffeine use: Coffee (drinks 1/2-1 cup per day)     Family History:  The patient's family history includes Diabetes in her sister and sister; Hypertension in her mother, sister, and sister; Stroke in her mother.  ROS:   Please see the history of present illness. All other systems are reviewed and otherwise negative.    PHYSICAL EXAM:   VS:  BP 140/88   Pulse 84   Ht 4\' 11"  (1.499 m)   Wt 107 lb (48.5 kg)   LMP 08/14/1981 (Approximate)   SpO2 98%   BMI 21.61 kg/m   BMI: Body mass index is 21.61 kg/m. GEN: Well nourished, well developed thin AAf, in no acute distress HEENT: normocephalic, atraumatic Neck: no JVD, carotid bruits, or masses Cardiac: RRR; soft SEM LSB, no rubs or gallops, no edema  Respiratory:  clear to auscultation bilaterally, normal work of breathing GI: soft, nontender, nondistended, + BS MS: kyphotic posture Skin: warm and dry, no rash Neuro:  Alert and Oriented x 3, Strength and sensation are intact, follows commands Psych: euthymic mood, full affect  Wt Readings from Last 3 Encounters:  02/18/18 107 lb (48.5 kg)  02/13/18 110 lb (49.9 kg)   01/22/18 110 lb (49.9 kg)      Studies/Labs Reviewed:   EKG:   EKG was not ordered today.  Recent Labs: 05/04/2017: NT-Pro BNP 257 11/10/2017: ALT 14 11/11/2017: BUN 8; Creatinine, Ser 0.71; Magnesium 1.9; Potassium 3.7; Sodium 139; TSH 2.218 11/12/2017: Hemoglobin 11.0; Platelets 168   Lipid Panel    Component Value Date/Time   CHOL 197 02/03/2015 0705   TRIG 61 02/03/2015 0705   HDL 65 02/03/2015 0705   CHOLHDL 3.0 02/03/2015 0705   VLDL 12 02/03/2015 0705   LDLCALC 120 (H) 02/03/2015 0705    Additional studies/ records that were reviewed today include: Summarized above    ASSESSMENT & PLAN:   1. Chronic diastolic CHF - appears euvolemic today. 2. Essential HTN - given history of syncope I think a goal BP of <150/90 is reasonale. Continue current regimen. 3. History of syncope - unclear cause. History is a little atypical for just hypotension but certainly a possibility given increase in carvedilol. Cannot exclude underlying conduction abnormality given h/o RBBB. We discussed options of event monitoring to exclude bradyarrhythmia versus conservative management off beta blocker. The latter is completely reasonable given her advanced age and lack of recurrent symptoms. Warning sx reviewed. 4. RBBB - chronic in nature. Has not had documented bradycardia below HR 50s before. 5. Blood in stool - will check CBC today and plan to forward to GI who sees patient later this week.  Disposition: F/u with Dr. 02/05/2015 in 6 months.   Medication Adjustments/Labs and Tests Ordered: Current medicines are reviewed at length with the patient today.  Concerns regarding medicines are outlined above. Medication changes, Labs and Tests ordered today are summarized above and listed in the Patient Instructions accessible in Encounters.   Signed, Delton See, PA-C  02/18/2018 2:02 PM    [facility] [address] [place], [place], [place]  [place] Phone: [phone]; Fax: [phone]

## 2018-02-18 ENCOUNTER — Encounter: Payer: Self-pay | Admitting: Physician Assistant

## 2018-02-18 ENCOUNTER — Ambulatory Visit (INDEPENDENT_AMBULATORY_CARE_PROVIDER_SITE_OTHER): Payer: Medicare Other | Admitting: Physician Assistant

## 2018-02-18 VITALS — BP 140/88 | HR 84 | Ht 59.0 in | Wt 107.0 lb

## 2018-02-18 DIAGNOSIS — I4510 Unspecified right bundle-branch block: Secondary | ICD-10-CM | POA: Diagnosis not present

## 2018-02-18 DIAGNOSIS — I639 Cerebral infarction, unspecified: Secondary | ICD-10-CM | POA: Diagnosis not present

## 2018-02-18 DIAGNOSIS — I5032 Chronic diastolic (congestive) heart failure: Secondary | ICD-10-CM

## 2018-02-18 DIAGNOSIS — Z87898 Personal history of other specified conditions: Secondary | ICD-10-CM

## 2018-02-18 DIAGNOSIS — I10 Essential (primary) hypertension: Secondary | ICD-10-CM

## 2018-02-18 DIAGNOSIS — K921 Melena: Secondary | ICD-10-CM | POA: Diagnosis not present

## 2018-02-18 NOTE — Patient Instructions (Signed)
Medication Instructions:  Your physician recommends that you continue on your current medications as directed. Please refer to the Current Medication list given to you today.   Labwork: Your physician recommends that you have lab work today: CBC   Testing/Procedures: -None  Follow-Up: Your physician wants you to follow-up in: 6 months with Dr. Delton See.  You will receive a reminder letter in the mail two months in advance. If you don't receive a letter, please call our office to schedule the follow-up appointment.   Any Other Special Instructions Will Be Listed Below (If Applicable).     If you need a refill on your cardiac medications before your next appointment, please call your pharmacy.

## 2018-02-19 LAB — CBC WITH DIFFERENTIAL/PLATELET
BASOS: 0 %
Basophils Absolute: 0 10*3/uL (ref 0.0–0.2)
EOS (ABSOLUTE): 0.1 10*3/uL (ref 0.0–0.4)
EOS: 1 %
HEMATOCRIT: 33.4 % — AB (ref 34.0–46.6)
Hemoglobin: 10.7 g/dL — ABNORMAL LOW (ref 11.1–15.9)
IMMATURE GRANULOCYTES: 0 %
Immature Grans (Abs): 0 10*3/uL (ref 0.0–0.1)
LYMPHS ABS: 2 10*3/uL (ref 0.7–3.1)
Lymphs: 21 %
MCH: 30.7 pg (ref 26.6–33.0)
MCHC: 32 g/dL (ref 31.5–35.7)
MCV: 96 fL (ref 79–97)
MONOS ABS: 1.5 10*3/uL — AB (ref 0.1–0.9)
Monocytes: 15 %
NEUTROS ABS: 6.2 10*3/uL (ref 1.4–7.0)
Neutrophils: 63 %
Platelets: 253 10*3/uL (ref 150–450)
RBC: 3.48 x10E6/uL — ABNORMAL LOW (ref 3.77–5.28)
RDW: 14.2 % (ref 12.3–15.4)
WBC: 9.8 10*3/uL (ref 3.4–10.8)

## 2018-02-21 ENCOUNTER — Telehealth: Payer: Self-pay | Admitting: Physician Assistant

## 2018-02-21 DIAGNOSIS — K5900 Constipation, unspecified: Secondary | ICD-10-CM | POA: Diagnosis not present

## 2018-02-21 DIAGNOSIS — K625 Hemorrhage of anus and rectum: Secondary | ICD-10-CM | POA: Diagnosis not present

## 2018-02-21 DIAGNOSIS — K5730 Diverticulosis of large intestine without perforation or abscess without bleeding: Secondary | ICD-10-CM | POA: Diagnosis not present

## 2018-02-21 DIAGNOSIS — Z800 Family history of malignant neoplasm of digestive organs: Secondary | ICD-10-CM | POA: Diagnosis not present

## 2018-02-21 NOTE — Telephone Encounter (Signed)
New Message   Pt's daughter is calling, states Dunn told her to call in with her bp reading form the Gastroenterologist appt today 190/97

## 2018-02-22 ENCOUNTER — Encounter (INDEPENDENT_AMBULATORY_CARE_PROVIDER_SITE_OTHER): Payer: Self-pay

## 2018-02-22 DIAGNOSIS — I10 Essential (primary) hypertension: Secondary | ICD-10-CM | POA: Diagnosis not present

## 2018-02-22 NOTE — Telephone Encounter (Signed)
BP was acceptable at recent OV, but would recommend patient continue to periodically follow and call if running >150/90 regularly. Dayna Dunn PA-C

## 2018-02-22 NOTE — Telephone Encounter (Signed)
New Message:      Pt's daughter is calling back to follow up from her call on yesterday about the pt's BP she was told to call and give per Dunn. Pt's daughter also states she would like to discuss some dx that was on the pt's AVS. She states she wants to discuss the CHF/RBB dx in further detail.

## 2018-02-22 NOTE — Telephone Encounter (Signed)
Dtr asking about recommendation from [facility] on elevated BP called in on 7/11.  Informed that she recommends they keep track of BP periodically and call if running >150/90 regularly.  She is agreeable to this plan.  Dtr then reports the families concern about CHF & RBBB diagnosis noted on her recent AVS.  She states that they do not know anything about this and would like to be called by MD/PA to get better educated on mom's condition/diagnoses.  I explained that I am not sure if MD/PA will be able to call and that they may recommend scheduling a sooner OV to discuss further. Dtr aware I will forward this to [facility] for recommendation/s.

## 2018-02-22 NOTE — Telephone Encounter (Signed)
  Please call daughter with the following info:  RBBB - this is a conduction abnormality on her EKG that has been there many years. It is a delay in the electrical impulse from point A to point B when the heart beats, but does not seem to be affecting her heart rate at present time. This is seen even in healthy hearts, so nothing of concern at present time - just need to continue to watch for symptoms of slow heart rate like we talked about at OV.  CHF - this patient has what's been called chronic diastolic CHF - the daughter might be more familiar with this as fluid retention. I suspect this has been primarily moreso due to venous insufficiency or leaky veins in the legs since her BNP traditionally has been fine. If there are any acute changes, we call it "acute" CHF but if there is chronic stiffness of the heart, it's called chronic diastolic CHF.This was also stable at recent OV. (Pt has not tolerated Lasix well in the past due to frequent urination - please make sure to clarify in her chart what dose she was taking. It was handwritten in on office visit but doesn't look like it got entered into chart.) In Ms. Row's case, this is managed with restricting sodium intake and not going overboard with fluid intake.   Chlora Mcbain PA-C

## 2018-02-22 NOTE — Telephone Encounter (Signed)
Reviewed with pt's dtr. Will forward note to MyChart per request.  She is appreciative of [place] talking with her about her questions.

## 2018-03-04 DIAGNOSIS — I10 Essential (primary) hypertension: Secondary | ICD-10-CM | POA: Diagnosis not present

## 2018-03-04 DIAGNOSIS — E039 Hypothyroidism, unspecified: Secondary | ICD-10-CM | POA: Diagnosis not present

## 2018-03-12 DIAGNOSIS — M0589 Other rheumatoid arthritis with rheumatoid factor of multiple sites: Secondary | ICD-10-CM | POA: Diagnosis not present

## 2018-03-21 ENCOUNTER — Ambulatory Visit: Payer: Medicare Other | Admitting: Certified Nurse Midwife

## 2018-03-27 ENCOUNTER — Ambulatory Visit (INDEPENDENT_AMBULATORY_CARE_PROVIDER_SITE_OTHER): Payer: Medicare Other | Admitting: Certified Nurse Midwife

## 2018-03-27 ENCOUNTER — Encounter: Payer: Self-pay | Admitting: Certified Nurse Midwife

## 2018-03-27 VITALS — BP 130/76 | HR 66 | Resp 14 | Ht 59.0 in | Wt 111.0 lb

## 2018-03-27 DIAGNOSIS — I639 Cerebral infarction, unspecified: Secondary | ICD-10-CM

## 2018-03-27 DIAGNOSIS — Z4689 Encounter for fitting and adjustment of other specified devices: Secondary | ICD-10-CM

## 2018-03-27 NOTE — Progress Notes (Signed)
Subjective:   84 y.o. Widowed Black female [id number] here for pessary re insertion..  Patient had been using following pessary style and size:# 2 3/4" ring pessary for cystocele support and rectocele support. The pessary was removed on 02/13/18 due to rectal bleeding unknown site. Patient relates she was seen by Dr. Loreta Ave and no concerns rectally. Patient has not had any urinary leaking except at night, unless out for long periods of time. Has not really noted "her pessary was not in". No issues with empty bowels or constipation.   ROS:   no breast pain or new or enlarging lumps on self exam, no vaginal bleeding, no discharge or pelvic pain,  no abnormal bleeding, pelvic pain or discharge, no dysuria, trouble voiding or hematuria   no dysuria, trouble voiding or hematuria. Compliant to use of vaginal cream Yes.   General Exam:    LMP 08/14/1981 (Approximate)   General appearance: alert, cooperative, appears stated age and no distress   Pelvic: External genitalia:  no lesions and atrophic appearance                Urethra: normal appearing urethra with no masses, tenderness or lesions              Vagina: normal appearing vagina with normal color and discharge, no lesions, atrophic, short vaginal vault with thinning noted..  There are No abrasions or ulcerations.               Cervix: normal appearance and no bleeding noted   Bimanual Exam:  Uterus:  uterus is normal size, shape, consistency and nontender                               Adnexa:    normal adnexa in size, nontender and no masses                          Pessary reinserted and does not fit well into vaginal vault now. Protrusion from vaginal opening. Readjusted and due to shortened vaginal vault unable to maintain good position. Pessary removed.  Assessment :  Normal pelvic exam with short vaginal vault and thin vaginal tissue noted. Patient not candidate for pessary at this time. Pessary to be stored here.       Plan:    Discussed  finding with patient and daughter and decided together, she had done well with it being out for over 6 weeks, she would stop using at this point, unless she increase in leakage. Patient and daughter agreeable to plan and will come in 3 months for aex and discuss again if needed.  Rv prn  After Visit Summary was printed and given to the patient.

## 2018-03-27 NOTE — Patient Instructions (Addendum)
Come in if problems with urination or leaking urine or vaginal bleeding. Come in if vaginal pressure.

## 2018-04-03 DIAGNOSIS — H6983 Other specified disorders of Eustachian tube, bilateral: Secondary | ICD-10-CM | POA: Diagnosis not present

## 2018-04-03 DIAGNOSIS — H6240 Otitis externa in other diseases classified elsewhere, unspecified ear: Secondary | ICD-10-CM | POA: Diagnosis not present

## 2018-04-03 DIAGNOSIS — H9113 Presbycusis, bilateral: Secondary | ICD-10-CM | POA: Diagnosis not present

## 2018-04-03 DIAGNOSIS — B369 Superficial mycosis, unspecified: Secondary | ICD-10-CM | POA: Diagnosis not present

## 2018-04-03 DIAGNOSIS — H903 Sensorineural hearing loss, bilateral: Secondary | ICD-10-CM | POA: Diagnosis not present

## 2018-04-09 DIAGNOSIS — M150 Primary generalized (osteo)arthritis: Secondary | ICD-10-CM | POA: Diagnosis not present

## 2018-04-09 DIAGNOSIS — Z79899 Other long term (current) drug therapy: Secondary | ICD-10-CM | POA: Diagnosis not present

## 2018-04-09 DIAGNOSIS — M5136 Other intervertebral disc degeneration, lumbar region: Secondary | ICD-10-CM | POA: Diagnosis not present

## 2018-04-09 DIAGNOSIS — M0589 Other rheumatoid arthritis with rheumatoid factor of multiple sites: Secondary | ICD-10-CM | POA: Diagnosis not present

## 2018-04-10 DIAGNOSIS — Z79899 Other long term (current) drug therapy: Secondary | ICD-10-CM | POA: Diagnosis not present

## 2018-04-10 DIAGNOSIS — M0589 Other rheumatoid arthritis with rheumatoid factor of multiple sites: Secondary | ICD-10-CM | POA: Diagnosis not present

## 2018-04-16 DIAGNOSIS — H16223 Keratoconjunctivitis sicca, not specified as Sjogren's, bilateral: Secondary | ICD-10-CM | POA: Diagnosis not present

## 2018-04-16 DIAGNOSIS — H04123 Dry eye syndrome of bilateral lacrimal glands: Secondary | ICD-10-CM | POA: Diagnosis not present

## 2018-05-08 ENCOUNTER — Emergency Department (HOSPITAL_BASED_OUTPATIENT_CLINIC_OR_DEPARTMENT_OTHER)
Admission: EM | Admit: 2018-05-08 | Discharge: 2018-05-08 | Disposition: A | Discharge status: Home / Self Care | Payer: Medicare Other | Attending: Emergency Medicine | Admitting: Emergency Medicine

## 2018-05-08 ENCOUNTER — Other Ambulatory Visit: Payer: Self-pay

## 2018-05-08 ENCOUNTER — Encounter (HOSPITAL_BASED_OUTPATIENT_CLINIC_OR_DEPARTMENT_OTHER): Payer: Self-pay | Admitting: *Deleted

## 2018-05-08 DIAGNOSIS — Z8673 Personal history of transient ischemic attack (TIA), and cerebral infarction without residual deficits: Secondary | ICD-10-CM | POA: Diagnosis not present

## 2018-05-08 DIAGNOSIS — Z7982 Long term (current) use of aspirin: Secondary | ICD-10-CM | POA: Insufficient documentation

## 2018-05-08 DIAGNOSIS — I110 Hypertensive heart disease with heart failure: Secondary | ICD-10-CM | POA: Insufficient documentation

## 2018-05-08 DIAGNOSIS — I5032 Chronic diastolic (congestive) heart failure: Secondary | ICD-10-CM | POA: Insufficient documentation

## 2018-05-08 DIAGNOSIS — E039 Hypothyroidism, unspecified: Secondary | ICD-10-CM | POA: Diagnosis not present

## 2018-05-08 DIAGNOSIS — I10 Essential (primary) hypertension: Secondary | ICD-10-CM

## 2018-05-08 DIAGNOSIS — Z79899 Other long term (current) drug therapy: Secondary | ICD-10-CM | POA: Insufficient documentation

## 2018-05-08 LAB — CBC WITH DIFFERENTIAL/PLATELET
BASOS PCT: 0 %
Basophils Absolute: 0 10*3/uL (ref 0.0–0.1)
EOS ABS: 0.3 10*3/uL (ref 0.0–0.7)
EOS PCT: 5 %
HCT: 39.4 % (ref 36.0–46.0)
HEMOGLOBIN: 13.6 g/dL (ref 12.0–15.0)
LYMPHS ABS: 2.3 10*3/uL (ref 0.7–4.0)
Lymphocytes Relative: 40 %
MCH: 31.3 pg (ref 26.0–34.0)
MCHC: 34.5 g/dL (ref 30.0–36.0)
MCV: 90.8 fL (ref 78.0–100.0)
Monocytes Absolute: 0.7 10*3/uL (ref 0.1–1.0)
Monocytes Relative: 11 %
NEUTROS PCT: 44 %
Neutro Abs: 2.6 10*3/uL (ref 1.7–7.7)
PLATELETS: 201 10*3/uL (ref 150–400)
RBC: 4.34 MIL/uL (ref 3.87–5.11)
RDW: 14.9 % (ref 11.5–15.5)
WBC: 5.9 10*3/uL (ref 4.0–10.5)

## 2018-05-08 LAB — URINALYSIS, ROUTINE W REFLEX MICROSCOPIC
Bilirubin Urine: NEGATIVE
Glucose, UA: NEGATIVE mg/dL
HGB URINE DIPSTICK: NEGATIVE
Ketones, ur: NEGATIVE mg/dL
Leukocytes, UA: NEGATIVE
NITRITE: NEGATIVE
PROTEIN: NEGATIVE mg/dL
SPECIFIC GRAVITY, URINE: 1.01 (ref 1.005–1.030)
pH: 7 (ref 5.0–8.0)

## 2018-05-08 LAB — COMPREHENSIVE METABOLIC PANEL
ALBUMIN: 4 g/dL (ref 3.5–5.0)
ALK PHOS: 57 U/L (ref 38–126)
ALT: 15 U/L (ref 0–44)
ANION GAP: 10 (ref 5–15)
AST: 19 U/L (ref 15–41)
BUN: 12 mg/dL (ref 8–23)
CALCIUM: 8.9 mg/dL (ref 8.9–10.3)
CHLORIDE: 98 mmol/L (ref 98–111)
CO2: 28 mmol/L (ref 22–32)
CREATININE: 0.68 mg/dL (ref 0.44–1.00)
GFR calc non Af Amer: >60 mL/min (ref >60)
GLUCOSE: 90 mg/dL (ref 70–99)
Potassium: 3.9 mmol/L (ref 3.5–5.1)
SODIUM: 136 mmol/L (ref 135–145)
Total Bilirubin: 0.3 mg/dL (ref 0.3–1.2)
Total Protein: 7.2 g/dL (ref 6.5–8.1)

## 2018-05-08 MED ORDER — HYDRALAZINE HCL 10 MG PO TABS
10.0000 mg | ORAL_TABLET | Freq: Once | ORAL | Status: DC
  Filled 2018-05-08: qty 1

## 2018-05-08 MED ORDER — HYDRALAZINE HCL 10 MG PO TABS: 10.0000 mg | ORAL_TABLET | Freq: Three times a day (TID) | ORAL | 0 refills | Status: DC

## 2018-05-08 MED ORDER — ACETAMINOPHEN 325 MG PO TABS: 650.0000 mg | ORAL_TABLET | Freq: Four times a day (QID) | ORAL | Status: DC | PRN

## 2018-05-08 MED ORDER — HYDRALAZINE HCL 25 MG PO TABS
25.0000 mg | ORAL_TABLET | Freq: Once | ORAL | Status: AC
  Administered 2018-05-08: 25 mg via ORAL
  Filled 2018-05-08: qty 1

## 2018-05-08 NOTE — ED Triage Notes (Signed)
Pt c/o increased BP x 3 days with HX of same, seen by PMD x 3 days ago , metoprolol script given but did not start

## 2018-05-08 NOTE — ED Notes (Signed)
ED Provider at bedside. 

## 2018-05-08 NOTE — Discharge Instructions (Addendum)
Do not take metoprolol,   See your Physician for recheck

## 2018-05-08 NOTE — ED Notes (Signed)
Attempted to obtain labs/IV x 3.

## 2018-05-08 NOTE — ED Notes (Signed)
Pt/family verbalized understanding of discharge instructions.   

## 2018-05-09 ENCOUNTER — Encounter (HOSPITAL_COMMUNITY): Payer: Self-pay

## 2018-05-09 ENCOUNTER — Emergency Department (HOSPITAL_COMMUNITY)
Admission: EM | Admit: 2018-05-09 | Discharge: 2018-05-09 | Disposition: A | Discharge status: Home / Self Care | Payer: Medicare Other | Attending: Emergency Medicine | Admitting: Emergency Medicine

## 2018-05-09 ENCOUNTER — Other Ambulatory Visit: Payer: Self-pay

## 2018-05-09 DIAGNOSIS — Z8673 Personal history of transient ischemic attack (TIA), and cerebral infarction without residual deficits: Secondary | ICD-10-CM | POA: Diagnosis not present

## 2018-05-09 DIAGNOSIS — I5033 Acute on chronic diastolic (congestive) heart failure: Secondary | ICD-10-CM | POA: Diagnosis not present

## 2018-05-09 DIAGNOSIS — I10 Essential (primary) hypertension: Secondary | ICD-10-CM

## 2018-05-09 DIAGNOSIS — Z79899 Other long term (current) drug therapy: Secondary | ICD-10-CM | POA: Diagnosis not present

## 2018-05-09 DIAGNOSIS — R4020 Unspecified coma: Secondary | ICD-10-CM | POA: Diagnosis not present

## 2018-05-09 DIAGNOSIS — Z7982 Long term (current) use of aspirin: Secondary | ICD-10-CM | POA: Insufficient documentation

## 2018-05-09 DIAGNOSIS — I959 Hypotension, unspecified: Secondary | ICD-10-CM | POA: Diagnosis not present

## 2018-05-09 DIAGNOSIS — R55 Syncope and collapse: Secondary | ICD-10-CM | POA: Diagnosis not present

## 2018-05-09 DIAGNOSIS — I110 Hypertensive heart disease with heart failure: Secondary | ICD-10-CM | POA: Insufficient documentation

## 2018-05-09 DIAGNOSIS — E039 Hypothyroidism, unspecified: Secondary | ICD-10-CM | POA: Diagnosis not present

## 2018-05-09 DIAGNOSIS — R0902 Hypoxemia: Secondary | ICD-10-CM | POA: Diagnosis not present

## 2018-05-09 NOTE — ED Triage Notes (Signed)
Pt brought in by [facility] from home for LOC x2 minutes. Pt seen yesterday for hypertension, was given prescription for hydralazine. Pt BP 146/90 at home so she took the medication- BP on EMS arrival 68/52. Pt given 200 NS- BP up to 110/50. Pt A+Ox4 on arrival and in NAD.

## 2018-05-09 NOTE — Discharge Instructions (Addendum)
Stoptaking the hydralazine, for now.  Continue your other medicines as directed.  Make sure you are drinking plenty of fluids and eating 3 meals each day.  If you feel dizzy or weak lie down immediately.  If you develop symptoms such as headache, nausea, vomiting, blurred vision, numbness, weakness in arms or legs, confusion, or dizziness, check your blood pressure.  Otherwise check your blood pressure about twice a day, in the morning in the mid afternoon.  Always take your blood pressure when you are resting, either sitting or lying down.  Return here, if needed, for problems.

## 2018-05-09 NOTE — ED Notes (Signed)
Patient verbalizes understanding of discharge instructions. Opportunity for questioning and answers were provided. Armband removed by staff, pt discharged from ED.  

## 2018-05-09 NOTE — ED Provider Notes (Signed)
[facility] EMERGENCY DEPARTMENT Provider Note   CSN: [id number] Arrival date & time: 05/09/18  2108     History   Chief Complaint Chief Complaint  Patient presents with  . Loss of Consciousness    HPI Anna Mccoy is a 84 y.o. female.  HPI   Patient here for evaluation of syncope associated with taking new blood pressure medication.  Patient was sitting in a chair, slumped over and was unresponsive.  EMS arrived and later down and she became responsive, was found to be hypotensive at 63/30 and was treated with IV fluids with improvement.  She was seen yesterday and started on hydralazine 10 mg twice daily for hypertension.  She has had problems with blood pressure recently, several days ago was prescribed metoprolol for an episode of high blood pressure at the dentist, but did not start taking it because she is previously had trouble with low blood pressure on metoprolol.  She is on multiple medications, which treat blood pressure.  She follows a low-salt diet and is active at home, caring for herself along with her daughter who is here with her tonight.  Yesterday she was urinating a lot, but today has not urinated as much.  She has not had any dysuria, urinary frequency or fever.  Has been taking all of her usual medications.  There are no other known modifying factors.  Past Medical History:  Diagnosis Date  . Anemia   . Arthritis   . Cerebral infarction due to unspecified mechanism   . 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 Chronic diarrhea of unknown origin 12/04/2015  . Chronic diastolic CHF (congestive heart failure) (HCC)   . CVA (cerebral infarction) 02/02/2015  . DDD (degenerative disc disease), lumbar   . Edema   . Essential tremor 09/29/2015  . Gait disturbance 06/06/2017  . Hx-TIA (transient ischemic attack)   . Hypertension   . Hypertensive heart disease with heart failure (HCC) 01/13/2016  . Hypertrophic toenail 09/28/2015  .  Hypothyroidism    HYPOTHYROIDISM  . Impacted cerumen of right ear 01/10/2018  . Kyphosis (acquired) (postural) 10/07/2016  . Menopausal state age 61  . Mitral valve regurgitation    TRIVIAL  . Osteoporosis 09/28/2015  . Peripheral neuropathy 09/28/2015  . Post-menopausal bleeding 02/2011   Endo Biopsy 7/12 benign, no hyperplasia  . Procidentia of uterus    uses pessary  . PVC's (premature ventricular contractions)   . RBBB   . Rheumatoid arthritis(714.0)   . Seizure cerebral   . Sudden idiopathic hearing loss of right ear 01/18/2018  . Syncope 11/10/2017  . Vitamin D deficiency disease     Patient Active Problem List   Diagnosis Date Noted  . Rectal bleeding 02/13/2018    Class: History of  . Dysfunction of right eustachian tube 02/06/2018  . Presbycusis of both ears 02/06/2018  . Sudden idiopathic hearing loss of right ear 01/18/2018  . Bilateral impacted cerumen 01/10/2018  . Hospital discharge follow-up 12/02/2017  . Other fatigue   . Syncope 11/10/2017  . Encounter for Medicare annual wellness exam 10/08/2017  . Abnormal urinalysis 08/22/2017  . Gait disturbance 06/06/2017  . Frailty 10/07/2016  . Kyphosis (acquired) (postural) 10/07/2016  . Bilateral leg edema 01/13/2016  . Hypertensive heart disease with heart failure (HCC) 01/13/2016  . Acute on chronic diastolic CHF (congestive heart failure), NYHA class 2 (HCC) 01/13/2016  . Blurred vision, bilateral 12/31/2015  . Bilateral dry eyes 12/31/2015  . Chronic fatigue  12/15/2015  . Chronic diarrhea of unknown origin 12/04/2015  . Essential tremor 09/29/2015  . Aphthous ulcer 09/28/2015  . Cardiac arrhythmia 09/28/2015  . Cataract 09/28/2015  . 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 History of headache 09/28/2015  . Hypertrophic toenail 09/28/2015  . Hypokalemia 09/28/2015  . Osteoporosis 09/28/2015  . Peripheral neuropathy 09/28/2015  . Seborrheic dermatitis 09/28/2015  . Situational anxiety  09/28/2015  . Vitamin D deficiency 09/28/2015  . Weight loss 09/28/2015  . History of stroke 09/28/2015  . UTI (lower urinary tract infection)   . Seizure cerebral   . CVA (cerebral infarction) 02/02/2015  . History of Stroke in 2016 without residual deficits (HCC) 02/02/2015  . Rheumatoid arthritis (HCC) 02/02/2015  . Headache 02/02/2015  . Hypertension 02/02/2015  . Hypothyroidism 02/02/2015  . Cerebral infarction due to unspecified mechanism   . Uterovaginal prolapse, complete 04/18/2013  . Complete uterovaginal prolapse 04/18/2013    Past Surgical History:  Procedure Laterality Date  . CHOLECYSTECTOMY, LAPAROSCOPIC  2002  . COLONOSCOPY W/ BIOPSIES  6/09   sigmoid diverticuli recheck 5 years  . EYE SURGERY  04/24/14   Cataract sx in right eye with lens replacement.  Marland Kitchen LAMINECTOMY AND MICRODISCECTOMY LUMBAR SPINE  01/23/09  . VAGINAL DELIVERY     x5     OB History    Gravida  5   Para  5   Term  5   Preterm  0   AB  0   Living  5     SAB  0   TAB  0   Ectopic  0   Multiple  0   Live Births  5            Home Medications    Prior to Admission medications   Medication Sig Start Date End Date Taking? Authorizing Provider  abatacept (ORENCIA) 250 MG injection Inject into the vein every 30 (thirty) days.    Yes [provider]  amLODipine (NORVASC) 5 MG tablet Take 5 mg by mouth daily.   Yes [provider]  aspirin 81 MG tablet Take 81 mg by mouth daily.    Yes [provider]  atorvastatin (LIPITOR) 20 MG tablet Take 20 mg by mouth every evening.    Yes [provider]  bisacodyl (DULCOLAX) 5 MG EC tablet Take 5 mg by mouth 2 (two) times daily.   Yes [provider]  carvedilol (COREG) 12.5 MG tablet Take 12.5 mg by mouth 2 (two) times daily with a meal.   Yes [provider]  divalproex (DEPAKOTE) 250 MG DR tablet Take 1 tablet (250 mg total) by mouth every 12 (twelve) hours. 02/05/15  Yes Rhetta Mura, MD  fluticasone (FLONASE) 50 MCG/ACT nasal spray Place 1 spray into both nostrils daily.  01/14/18  Yes [provider]  folic acid (FOLVITE) 1 MG tablet Take 1 mg by mouth daily.   Yes [provider]  furosemide (LASIX) 20 MG tablet Take 10 mg by mouth daily as needed for fluid.  10/20/16  Yes [provider]  hypromellose (GENTEAL SEVERE) 0.3 % GEL ophthalmic ointment Place 1 application into both eyes at bedtime.   Yes [provider]  leflunomide (ARAVA) 20 MG tablet Take 20 mg by mouth every other day.   Yes [provider]  levothyroxine (SYNTHROID, LEVOTHROID) 75 MCG tablet Take 75 mcg by mouth daily before breakfast. Unsure if patient is taking 75 or   Yes  [provider]  Multiple Vitamins-Minerals (OCUVITE PRESERVISION PO) Take 1 tablet by mouth 2 (two) times daily.    Yes [provider]  naproxen sodium (ALEVE) 220 MG tablet Take 220 mg by mouth 2 (two) times daily as needed (pain).    Yes [provider]  olmesartan-hydrochlorothiazide (BENICAR HCT) 40-25 MG tablet Take 1 tablet by mouth daily. 11/05/17  Yes [provider]  POLY-IRON 150 150 MG capsule Take 150 mg by mouth daily.  10/13/15  Yes [provider]  polyethylene glycol (MIRALAX) packet Take 17 g by mouth as needed for mild constipation.    Yes [provider]  potassium chloride SA (K-DUR,KLOR-CON) 20 MEQ tablet Take 20 mEq by mouth 2 (two) times daily.   Yes [provider]  PREMARIN vaginal cream APPLY VAGINALLY 3 TIMES A WEEK 12/04/16  Yes Ria Comment, FNP  PRESCRIPTION MEDICATION Place 1 drop into both eyes 3 (three) times daily. Vital tears   Yes [provider]  primidone (MYSOLINE) 50 MG tablet Take 1 tablet by mouth 2 (two) times daily.  09/20/16  Yes [provider]  RESTASIS MULTIDOSE 0.05 % ophthalmic emulsion Take 1 drop by mouth 2 (two) times daily. 09/05/17  Yes [provider]  UNABLE TO FIND Take 1 capsule by mouth every evening. Ultra flora    Yes [provider]    Family History Family History  Problem Relation Age of Onset  . Hypertension Mother   . Stroke Mother   . Hypertension Sister   . Diabetes Sister   . Hypertension Sister   . Diabetes Sister     Social History Social History   Tobacco Use  . Smoking status: Never Smoker  . Smokeless tobacco: Never Used  Substance Use Topics  . Alcohol use: No  . Drug use: No     Allergies   Ace inhibitors; Ciprofloxacin; Lidocaine; Neosporin [neomycin-bacitracin zn-polymyx]; and Sulfa antibiotics   Review of Systems Review of Systems  All other systems reviewed and are negative.    Physical Exam Updated Vital Signs BP (!) 169/87   Pulse 64   Temp 98 F (36.7 C) (Oral)   Resp (!) 27   Ht 4\' 8"  (1.422 m)   Wt 49.9 kg   LMP 08/14/1981 (Approximate)   SpO2 97%   BMI 24.66 kg/m   Physical Exam  Constitutional: She is oriented to person, place, and time. She appears well-developed and well-nourished.  HENT:  Head: Normocephalic and atraumatic.  Eyes: Pupils are equal, round, and reactive to light. Conjunctivae and EOM are normal.  Neck: Normal range of motion and phonation normal. Neck supple.  Cardiovascular: Normal rate and regular rhythm.  Check blood pressure manually right left arm, and found them both to be 175/80.  This was essentially identical to the current automated blood pressure on the monitor.  Pulmonary/Chest: Effort normal and breath sounds normal. She exhibits no tenderness.  Musculoskeletal: Normal range of motion.  Neurological: She is alert and oriented to person, place, and time. She exhibits normal muscle tone.  Skin: Skin is warm and dry.  Psychiatric: She has a normal mood and affect. Her behavior is normal. Judgment and thought content normal.  Nursing note and vitals reviewed.    ED Treatments / Results  Labs (all labs ordered are  listed, but only abnormal results are displayed) Labs Reviewed - No data to display  EKG None  Radiology No results found.  Procedures Procedures (including critical care time)  Medications Ordered in ED Medications - No data to display   Initial Impression / Assessment and Plan / ED Course  I have reviewed the triage vital signs and the nursing notes.  Pertinent labs & imaging results that were available during my care of the patient were reviewed by me and considered in my medical decision making (see chart for details).      Patient Vitals for the past 24 hrs:  BP Temp Temp src Pulse Resp SpO2 Height Weight  05/09/18 2245 (!) 169/87 - - 64 (!) 27 97 % - -  05/09/18 2200 (!) 174/86 - - 63 (!) 26 98 % - -  05/09/18 2145 (!) 163/87 - - 65 (!) 30 98 % - -  05/09/18 2125 - - - - - - 4\' 8"  (1.422 m) 49.9 kg  05/09/18 2118 (!) 193/95 98 F (36.7 C) Oral 66 16 100 % - -    10:45 PM Reevaluation with update and discussion. After initial assessment and treatment, an updated evaluation reveals she remains stable and has no further complaints.  Findings discussed with patient and daughter, and questions answered. Mancel Bale   Medical Decision Making: Labile blood pressure today with syncope associated with hypotension.  Hypotension today likely secondary to new medicine, hydralazine.  Patient is to stop taking colchicine at this time.  Is on multiple blood pressure medications including 2 types of diuretic.  She will need close monitoring in the outpatient setting.  Or other things that will be important is to monitor for symptoms, as much or more than the actual blood pressure number.  Patient's daughter was instructed in depth on this, by me.  Doubt hypertensive urgency, ACS, metabolic instability.  CRITICAL CARE-no Performed by: Mancel Bale  Nursing Notes Reviewed/ Care Coordinated Applicable Imaging Reviewed Interpretation of Laboratory Data incorporated into ED  treatment  The patient appears reasonably screened and/or stabilized for discharge and I doubt any other medical condition or other [facility] requiring further screening, evaluation, or treatment in the ED at this time prior to discharge.  Plan: Home Medications-stop hydralazine, continue usual medications; Home Treatments-rest, fluids; return here if the recommended treatment, does not improve the symptoms; Recommended follow up-PCP follow-up as scheduled, and as needed.    Final Clinical Impressions(s) / ED Diagnoses   Final diagnoses:  Syncope, unspecified syncope type  Hypertension, unspecified type    ED Discharge Orders    None       Mancel Bale, MD 05/09/18 2250

## 2018-05-09 NOTE — ED Provider Notes (Signed)
[facility] EMERGENCY DEPARTMENT Provider Note   CSN: [id number] Arrival date & time: 05/08/18  1350     History   Chief Complaint Chief Complaint  Patient presents with  . Hypertension    HPI Anna Mccoy is a 84 y.o. female.  The history is provided by the patient. No language interpreter was used.  Hypertension  This is a new problem. The current episode started more than 2 days ago. The problem occurs constantly. The problem has been gradually worsening. Pertinent negatives include no chest pain and no abdominal pain. Nothing aggravates the symptoms. Nothing relieves the symptoms. She has tried nothing for the symptoms.  Pts daughter reports pt blood pressure was high.  Pt was seen by NP at her Doctors office.  Pt was given rx for metoprolol and told to stop carbetalol.  Family filled rx but they did not want to give pt metopolol because she had her heart rate drop low in the past.  Pt had increase in blood pressure today   Past Medical History:  Diagnosis Date  . Anemia   . Arthritis   . Cerebral infarction due to unspecified mechanism   . 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 Chronic diarrhea of unknown origin 12/04/2015  . Chronic diastolic CHF (congestive heart failure) (HCC)   . CVA (cerebral infarction) 02/02/2015  . DDD (degenerative disc disease), lumbar   . Edema   . Essential tremor 09/29/2015  . Gait disturbance 06/06/2017  . Hx-TIA (transient ischemic attack)   . Hypertension   . Hypertensive heart disease with heart failure (HCC) 01/13/2016  . Hypertrophic toenail 09/28/2015  . Hypothyroidism    HYPOTHYROIDISM  . Impacted cerumen of right ear 01/10/2018  . Kyphosis (acquired) (postural) 10/07/2016  . Menopausal state age 34  . Mitral valve regurgitation    TRIVIAL  . Osteoporosis 09/28/2015  . Peripheral neuropathy 09/28/2015  . Post-menopausal bleeding 02/2011   Endo Biopsy 7/12 benign, no hyperplasia  . Procidentia of uterus     uses pessary  . PVC's (premature ventricular contractions)   . RBBB   . Rheumatoid arthritis(714.0)   . Seizure cerebral   . Sudden idiopathic hearing loss of right ear 01/18/2018  . Syncope 11/10/2017  . Vitamin D deficiency disease     Patient Active Problem List   Diagnosis Date Noted  . Rectal bleeding 02/13/2018    Class: History of  . Dysfunction of right eustachian tube 02/06/2018  . Presbycusis of both ears 02/06/2018  . Sudden idiopathic hearing loss of right ear 01/18/2018  . Bilateral impacted cerumen 01/10/2018  . Hospital discharge follow-up 12/02/2017  . Other fatigue   . Syncope 11/10/2017  . Encounter for Medicare annual wellness exam 10/08/2017  . Abnormal urinalysis 08/22/2017  . Gait disturbance 06/06/2017  . Frailty 10/07/2016  . Kyphosis (acquired) (postural) 10/07/2016  . Bilateral leg edema 01/13/2016  . Hypertensive heart disease with heart failure (HCC) 01/13/2016  . Acute on chronic diastolic CHF (congestive heart failure), NYHA class 2 (HCC) 01/13/2016  . Blurred vision, bilateral 12/31/2015  . Bilateral dry eyes 12/31/2015  . Chronic fatigue 12/15/2015  . Chronic diarrhea of unknown origin 12/04/2015  . Essential tremor 09/29/2015  . Aphthous ulcer 09/28/2015  . Cardiac arrhythmia 09/28/2015  . Cataract 09/28/2015  . 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 History of headache 09/28/2015  . Hypertrophic toenail 09/28/2015  . Hypokalemia 09/28/2015  . Osteoporosis 09/28/2015  . Peripheral neuropathy 09/28/2015  .  Seborrheic dermatitis 09/28/2015  . Situational anxiety 09/28/2015  . Vitamin D deficiency 09/28/2015  . Weight loss 09/28/2015  . History of stroke 09/28/2015  . UTI (lower urinary tract infection)   . Seizure cerebral   . CVA (cerebral infarction) 02/02/2015  . History of Stroke in 2016 without residual deficits (HCC) 02/02/2015  . Rheumatoid arthritis (HCC) 02/02/2015  . Headache 02/02/2015  . Hypertension  02/02/2015  . Hypothyroidism 02/02/2015  . Cerebral infarction due to unspecified mechanism   . Uterovaginal prolapse, complete 04/18/2013  . Complete uterovaginal prolapse 04/18/2013    Past Surgical History:  Procedure Laterality Date  . CHOLECYSTECTOMY, LAPAROSCOPIC  2002  . COLONOSCOPY W/ BIOPSIES  6/09   sigmoid diverticuli recheck 5 years  . EYE SURGERY  04/24/14   Cataract sx in right eye with lens replacement.  Marland Kitchen LAMINECTOMY AND MICRODISCECTOMY LUMBAR SPINE  01/23/09  . VAGINAL DELIVERY     x5     OB History    Gravida  5   Para  5   Term  5   Preterm  0   AB  0   Living  5     SAB  0   TAB  0   Ectopic  0   Multiple  0   Live Births  5            Home Medications    Prior to Admission medications   Medication Sig Start Date End Date Taking? Authorizing Provider  carvedilol (COREG) 12.5 MG tablet Take 12.5 mg by mouth 2 (two) times daily with a meal.   Yes [provider]  abatacept (ORENCIA) 250 MG injection Inject into the vein every 30 (thirty) days.     [provider]  acetaminophen (TYLENOL) 500 MG tablet Take 500 mg by mouth as needed for mild pain.     [provider]  amLODipine (NORVASC) 10 MG tablet TAKE 1 TABLET(5 MG) BY MOUTH DAILY FOR  BLOOD PRESSURE CONTROL 11/14/17   [provider]  aspirin 81 MG tablet Take 81 mg by mouth daily.     [provider]  atorvastatin (LIPITOR) 20 MG tablet Take 20 mg by mouth daily.    [provider]  divalproex (DEPAKOTE) 250 MG DR tablet Take 1 tablet (250 mg total) by mouth every 12 (twelve) hours. 02/05/15   Rhetta Mura, MD  fluorometholone (FML) 0.1 % ophthalmic suspension Place 1 drop into both eyes 4 (four) times daily. 10/13/16   [provider]  fluticasone (FLONASE) 50 MCG/ACT nasal spray SHAKE LIQUID AND USE 1 SPRAY IN EACH NOSTRIL TWICE DAILY 01/14/18   [provider]  folic acid (FOLVITE) 1 MG tablet Take 1 mg by  mouth daily.    [provider]  furosemide (LASIX) 20 MG tablet  10/20/16   [provider]  hydrALAZINE (APRESOLINE) 10 MG tablet Take 1 tablet (10 mg total) by mouth 3 (three) times daily. 05/08/18   Elson Areas, PA-C  leflunomide (ARAVA) 20 MG tablet Take 20 mg by mouth every other day.    [provider]  levothyroxine (SYNTHROID, LEVOTHROID) 75 MCG tablet Take 75 mcg by mouth daily before breakfast. Unsure if patient is taking 75 or    [provider]  Multiple Vitamins-Minerals (OCUVITE PRESERVISION PO) Take 1 tablet by mouth 2 (two) times daily.     [provider]  olmesartan-hydrochlorothiazide (BENICAR HCT) 40-25 MG tablet Take 1 tablet by mouth daily. 11/05/17  [provider]  POLY-IRON 150 150 MG capsule Take 150 mg by mouth daily.  10/13/15   [provider]  Polyethyl Glycol-Propyl Glycol (SYSTANE) 0.4-0.3 % SOLN Place 1 drop into both eyes 3 (three) times daily.     [provider]  polyethylene glycol (MIRALAX) packet Take 17 g by mouth as needed for mild constipation.    [provider]  potassium chloride SA (K-DUR,KLOR-CON) 20 MEQ tablet Take 20 mEq by mouth 2 (two) times daily.    [provider]  PREMARIN vaginal cream APPLY VAGINALLY 3 TIMES A WEEK 12/04/16   Ria Comment, FNP  primidone (MYSOLINE) 50 MG tablet Take 1 tablet by mouth 2 (two) times daily.  09/20/16   [provider]  RESTASIS MULTIDOSE 0.05 % ophthalmic emulsion Take 1 drop by mouth 2 (two) times daily. 09/05/17   [provider]  UNABLE TO FIND Refresh for ears    [provider]  UNABLE TO FIND Ultra flora    [provider]    Family History Family History  Problem Relation Age of Onset  . Hypertension Mother   . Stroke Mother   . Hypertension Sister   . Diabetes Sister   . Hypertension Sister   . Diabetes Sister     Social History Social History   Tobacco Use  .  Smoking status: Never Smoker  . Smokeless tobacco: Never Used  Substance Use Topics  . Alcohol use: No  . Drug use: No     Allergies   Ciprofloxacin; Lidocaine; Neosporin [neomycin-bacitracin zn-polymyx]; and Sulfa antibiotics   Review of Systems Review of Systems  Cardiovascular: Negative for chest pain.  Gastrointestinal: Negative for abdominal pain.  All other systems reviewed and are negative.    Physical Exam Updated Vital Signs BP (!) 181/92   Pulse 64   Temp 98.4 F (36.9 C) (Oral)   Resp (!) 25   Ht 4\' 8"  (1.422 m)   Wt 49.9 kg   LMP 08/14/1981 (Approximate)   SpO2 97%   BMI 24.66 kg/m   Physical Exam  Constitutional: She is oriented to person, place, and time. She appears well-developed and well-nourished.  HENT:  Head: Normocephalic.  Right Ear: External ear normal.  Left Ear: External ear normal.  Mouth/Throat: Oropharynx is clear and moist.  Eyes: EOM are normal.  Neck: Normal range of motion.  Cardiovascular: Normal rate and regular rhythm.  Pulmonary/Chest: Effort normal.  Abdominal: She exhibits no distension.  Musculoskeletal: Normal range of motion.  Neurological: She is alert and oriented to person, place, and time.  Psychiatric: She has a normal mood and affect.  Nursing note and vitals reviewed.    ED Treatments / Results  Labs (all labs ordered are listed, but only abnormal results are displayed) Labs Reviewed  CBC WITH DIFFERENTIAL/PLATELET  URINALYSIS, ROUTINE W REFLEX MICROSCOPIC  COMPREHENSIVE METABOLIC PANEL    EKG EKG Interpretation  Date/Time:  Wednesday May 08 2018 14:15:38 EDT Ventricular Rate:  64 PR Interval:    QRS Duration: 136 QT Interval:  441 QTC Calculation: 455 R Axis:   14 Text Interpretation:  Sinus rhythm Right bundle branch block Anterior infarct, age indeterminate No significant change since last tracing Confirmed by Jacalyn Lefevre [phone]) on 05/09/2018 1:21:01 PM   Radiology No results  found.  Procedures Procedures (including critical care time)  Medications Ordered in ED Medications  hydrALAZINE (APRESOLINE) tablet 25 mg (25 mg Oral Given 05/08/18 1750)     Initial Impression /  Assessment and Plan / ED Course  I have reviewed the triage vital signs and the nursing notes.  Pertinent labs & imaging results that were available during my care of the patient were reviewed by me and considered in my medical decision making (see chart for details).     Pt given hydrolazine here.  Decreased blood pressure.   I will give rx for hydrazine.   I advised hold metoprolol until you discuss your concerns with your MD.     Final Clinical Impressions(s) / ED Diagnoses   Final diagnoses:  Hypertension, unspecified type    ED Discharge Orders         Ordered    hydrALAZINE (APRESOLINE) 10 MG tablet  3 times daily     05/08/18 1852        An After Visit Summary was printed and given to the patient.    Elson Areas, PA-C 05/09/18 1709    Melene Plan, DO 05/09/18 1807

## 2018-05-16 DIAGNOSIS — M0589 Other rheumatoid arthritis with rheumatoid factor of multiple sites: Secondary | ICD-10-CM | POA: Diagnosis not present

## 2018-05-23 DIAGNOSIS — Z23 Encounter for immunization: Secondary | ICD-10-CM | POA: Diagnosis not present

## 2018-05-24 ENCOUNTER — Encounter: Payer: Self-pay | Admitting: Cardiology

## 2018-05-27 ENCOUNTER — Ambulatory Visit: Payer: Medicare Other | Admitting: Cardiology

## 2018-05-27 ENCOUNTER — Ambulatory Visit: Payer: Medicare Other | Admitting: Nurse Practitioner

## 2018-05-31 ENCOUNTER — Other Ambulatory Visit: Payer: Self-pay

## 2018-05-31 ENCOUNTER — Other Ambulatory Visit: Payer: Self-pay | Admitting: Internal Medicine

## 2018-05-31 NOTE — Telephone Encounter (Signed)
Medication refill request: Premarin Vaginal cream  Last OV: 03/27/18 Next AEX:06/26/18 Last MMG (if hormonal medication request): 10/27/09  Bi-Rads 1 Neg  Refill authorized: 30g with 1 Rf

## 2018-06-05 MED ORDER — ESTROGENS, CONJUGATED 0.625 MG/GM VA CREA: TOPICAL_CREAM | VAGINAL | 1 refills | Status: DC

## 2018-06-05 NOTE — Telephone Encounter (Signed)
Patient needs phone call regarding continued Premarin cream use. She had breast exam 09/26/17 and pelvic exams here for pessary. I will renew Premarin until 2/20, but will need exam here since no pessary follow up next year for breast and pelvic.

## 2018-06-05 NOTE — Telephone Encounter (Signed)
Patient has an appointment 06/26/18

## 2018-06-07 ENCOUNTER — Ambulatory Visit (INDEPENDENT_AMBULATORY_CARE_PROVIDER_SITE_OTHER): Payer: Medicare Other | Admitting: Cardiology

## 2018-06-07 ENCOUNTER — Encounter: Payer: Self-pay | Admitting: Cardiology

## 2018-06-07 VITALS — BP 134/82 | HR 78 | Ht <= 58 in | Wt 111.6 lb

## 2018-06-07 DIAGNOSIS — I5032 Chronic diastolic (congestive) heart failure: Secondary | ICD-10-CM | POA: Diagnosis not present

## 2018-06-07 DIAGNOSIS — I10 Essential (primary) hypertension: Secondary | ICD-10-CM

## 2018-06-07 DIAGNOSIS — R55 Syncope and collapse: Secondary | ICD-10-CM | POA: Diagnosis not present

## 2018-06-07 DIAGNOSIS — I639 Cerebral infarction, unspecified: Secondary | ICD-10-CM

## 2018-06-07 DIAGNOSIS — R54 Age-related physical debility: Secondary | ICD-10-CM

## 2018-06-07 NOTE — Patient Instructions (Signed)
Medication Instructions:  Your physician recommends that you continue on your current medications as directed. Please refer to the Current Medication list given to you today.  If you need a refill on your cardiac medications before your next appointment, please call your pharmacy.   Lab work: None If you have labs (blood work) drawn today and your tests are completely normal, you will receive your results only by: Marland Kitchen MyChart Message (if you have MyChart) OR . A paper copy in the mail If you have any lab test that is abnormal or we need to change your treatment, we will call you to review the results.  Testing/Procedures: None  Follow-Up: At [facility], you and your health needs are our priority.  As part of our continuing mission to provide you with exceptional heart care, we have created designated Provider Care Teams.  These Care Teams include your primary Cardiologist (physician) and Advanced Practice Providers (APPs -  Physician Assistants and Nurse Practitioners) who all work together to provide you with the care you need, when you need it. You will need a follow up appointment in 3 months.  Please call our office 2 months in advance to schedule this appointment.  You may see Tobias Alexander, MD or one of the following Advanced Practice Providers on your designated Care Team:   [place], PA-C Ronie Spies, PA-C . Jacolyn Reedy, PA-C  Any Other Special Instructions Will Be Listed Below (If Applicable).

## 2018-06-07 NOTE — Progress Notes (Signed)
Cardiology Office Note:    Date:  06/07/2018   ID:  Anna Mccoy, DOB 1925-02-03, MRN [id number]  PCP:  [name], MD  Cardiologist:  Tobias Alexander, MD  Referring MD: [name], MD   Chief Complaint  Patient presents with  . Follow-up    Syncope  . Hypertension    History of Present Illness:    Anna Mccoy is a 84 y.o. female with a past medical history significant for HTN, stroke/TIA, edema/SOB felt possibly r/t chronic diastolic CHF, rheumatoid arthritis, hypothyroidism, RBBB, chronic anemia, PVCs by prior EKG.   Patient has had issues in the past with low blood pressure and syncope when trying to treat an elevated blood pressure.  At last office visit in July of this year it was noted that we would have her goal blood pressure of <150/90.  Anna Mccoy apparently had syncope after an increase in carvedilol so beta-blockers have been stopped.  On 05/08/18 her daughter noted her blood pressure to be elevated so Anna Mccoy took her to the fire station to have it checked.  Her blood pressure was elevated and they sent her to the emergency department. BP was 181/92 in the ED. Blood pressure came down with hydralazine so Anna Mccoy was sent home with hydralazine 10 mg 3 times daily.  Anna Mccoy returned to the ED on 05/09/2018 after becoming unresponsive while sitting in a chair after eating breakfast so her daughter called 911.  When EMS arrived her blood pressure was 63/30 and Anna Mccoy was treated with IV fluids with improvement.  Her hydralazine was discontinued and Anna Mccoy is here for close follow-up.  Anna Mccoy is tired all the time and sleeps a lot in her recliner during the day. Anna Mccoy denies chest pain/pressure/tightness, shortness of breath, dizziness since her ER visit.  Anna Mccoy has had no further syncope.  Anna Mccoy is mostly sedentary and her daughter is requesting a referral for physical therapy.  Anna Mccoy is wearing compression stockings and Anna Mccoy limits her sodium intake.   It is unclear whether Anna Mccoy was taking Lasix  daily or just as needed.  Her daughter feels like Anna Mccoy may be taking the Lasix every day.  Anna Mccoy has trace ankle edema  Past Medical History:  Diagnosis Date  . Anemia   . Arthritis   . Cerebral infarction due to unspecified mechanism   . 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 Chronic diarrhea of unknown origin 12/04/2015  . Chronic diastolic CHF (congestive heart failure) (HCC)   . CVA (cerebral infarction) 02/02/2015  . DDD (degenerative disc disease), lumbar   . Edema   . Essential tremor 09/29/2015  . Gait disturbance 06/06/2017  . Hx-TIA (transient ischemic attack)   . Hypertension   . Hypertensive heart disease with heart failure (HCC) 01/13/2016  . Hypertrophic toenail 09/28/2015  . Hypothyroidism    HYPOTHYROIDISM  . Impacted cerumen of right ear 01/10/2018  . Kyphosis (acquired) (postural) 10/07/2016  . Menopausal state age 72  . Mitral valve regurgitation    TRIVIAL  . Osteoporosis 09/28/2015  . Peripheral neuropathy 09/28/2015  . Post-menopausal bleeding 02/2011   Endo Biopsy 7/12 benign, no hyperplasia  . Procidentia of uterus    uses pessary  . PVC's (premature ventricular contractions)   . RBBB   . Rheumatoid arthritis(714.0)   . Seizure cerebral   . Sudden idiopathic hearing loss of right ear 01/18/2018  . Syncope 11/10/2017  . Vitamin D deficiency disease     Past Surgical History:  Procedure  Laterality Date  . CHOLECYSTECTOMY, LAPAROSCOPIC  2002  . COLONOSCOPY W/ BIOPSIES  6/09   sigmoid diverticuli recheck 5 years  . EYE SURGERY  04/24/14   Cataract sx in right eye with lens replacement.  Marland Kitchen LAMINECTOMY AND MICRODISCECTOMY LUMBAR SPINE  01/23/09  . VAGINAL DELIVERY     x5    Current Medications: Current Meds  Medication Sig  . amLODipine (NORVASC) 5 MG tablet Take 5 mg by mouth daily.  Marland Kitchen aspirin 81 MG tablet Take 81 mg by mouth daily.   Marland Kitchen atorvastatin (LIPITOR) 20 MG tablet Take 20 mg by mouth every evening.   . divalproex (DEPAKOTE) 250 MG DR  tablet Take 1 tablet (250 mg total) by mouth every 12 (twelve) hours.  . fluticasone (FLONASE) 50 MCG/ACT nasal spray Place 1 spray into both nostrils daily.   . folic acid (FOLVITE) 1 MG tablet Take 1 mg by mouth daily.  . furosemide (LASIX) 20 MG tablet Take 10 mg by mouth daily as needed for fluid.   . hypromellose (GENTEAL SEVERE) 0.3 % GEL ophthalmic ointment Place 1 application into both eyes at bedtime.  Marland Kitchen leflunomide (ARAVA) 20 MG tablet Take 20 mg by mouth every other day.  . levothyroxine (SYNTHROID, LEVOTHROID) 75 MCG tablet Take 1 tablet by mouth daily.  . Multiple Vitamins-Minerals (OCUVITE PRESERVISION PO) Take 1 tablet by mouth 2 (two) times daily.   Marland Kitchen olmesartan-hydrochlorothiazide (BENICAR HCT) 40-25 MG tablet Take 1 tablet by mouth daily.  Marland Kitchen POLY-IRON 150 150 MG capsule Take 150 mg by mouth daily.   . polyethylene glycol (MIRALAX) packet Take 17 g by mouth as needed for mild constipation.   . potassium chloride SA (K-DUR,KLOR-CON) 20 MEQ tablet Take 20 mEq by mouth 2 (two) times daily.  Marland Kitchen PRESCRIPTION MEDICATION Place 1 drop into both eyes 3 (three) times daily. Vital tears  . primidone (MYSOLINE) 50 MG tablet Take 1 tablet by mouth 2 (two) times daily.   Marland Kitchen UNABLE TO FIND Take 1 capsule by mouth every evening. Ultra flora   . [DISCONTINUED] carvedilol (COREG) 12.5 MG tablet Take 12.5 mg by mouth 2 (two) times daily with a meal.     Allergies:   Ace inhibitors; Ciprofloxacin; Lidocaine; Neosporin [neomycin-bacitracin zn-polymyx]; and Sulfa antibiotics   Social History   Socioeconomic History  . Marital status: Widowed    Spouse name: Not on file  . Number of children: 5  . Years of education: 67  . Highest education level: Not on file  Occupational History  . Occupation: Retired   Engineer, production  . Financial resource strain: Not on file  . Food insecurity:    Worry: Not on file    Inability: Not on file  . Transportation needs:    Medical: Not on file     Non-medical: Not on file  Tobacco Use  . Smoking status: Never Smoker  . Smokeless tobacco: Never Used  Substance and Sexual Activity  . Alcohol use: No  . Drug use: No  . Sexual activity: Not Currently    Comment: Widowed 01/10/07 age 51  Lifestyle  . Physical activity:    Days per week: Not on file    Minutes per session: Not on file  . Stress: Not on file  Relationships  . Social connections:    Talks on phone: Not on file    Gets together: Not on file    Attends religious service: Not on file    Active member of club or organization:  Not on file    Attends meetings of clubs or organizations: Not on file    Relationship status: Not on file  Other Topics Concern  . Not on file  Social History Narrative   Patient is right handed, resides with daughter and sister.    Caffeine use: Coffee (drinks 1/2-1 cup per day)     Family History: The patient's family history includes Diabetes in her sister and sister; Hypertension in her mother, sister, and sister; Stroke in her mother. ROS:   Please see the history of present illness.     All other systems reviewed and are negative.  EKGs/Labs/Other Studies Reviewed:    The following studies were reviewed today:  Echocardiogram 11/11/2017 Study Conclusions - Left ventricle: The cavity size was normal. Systolic function was   vigorous. The estimated ejection fraction was in the range of 65%   to 70%. Wall motion was normal; there were no regional wall   motion abnormalities. There was an increased relative   contribution of atrial contraction to ventricular filling.   Doppler parameters are consistent with abnormal left ventricular   relaxation (grade 1 diastolic dysfunction). - Aortic valve: Trileaflet; mildly thickened, mildly calcified   leaflets. - Tricuspid valve: There was mild-moderate regurgitation. - Pulmonic valve: There was trivial regurgitation. - Pulmonary arteries: PA peak pressure: 32 mm Hg (S).   EKG:  EKG is   ordered today.    Recent Labs: 11/11/2017: Magnesium 1.9; TSH 2.218 05/08/2018: ALT 15; BUN 12; Creatinine, Ser 0.68; Hemoglobin 13.6; Platelets 201; Potassium 3.9; Sodium 136   Recent Lipid Panel    Component Value Date/Time   CHOL 197 02/03/2015 0705   TRIG 61 02/03/2015 0705   HDL 65 02/03/2015 0705   CHOLHDL 3.0 02/03/2015 0705   VLDL 12 02/03/2015 0705   LDLCALC 120 (H) 02/03/2015 0705    Physical Exam:    VS:  BP 134/82   Pulse 78   Ht 4\' 8"  (1.422 m)   Wt 111 lb 9.6 oz (50.6 kg)   LMP 08/14/1981 (Approximate)   SpO2 98%   BMI 25.02 kg/m     Wt Readings from Last 3 Encounters:  06/07/18 111 lb 9.6 oz (50.6 kg)  05/09/18 110 lb (49.9 kg)  05/08/18 110 lb (49.9 kg)     Physical Exam  Constitutional: Anna Mccoy is oriented to person, place, and time. Anna Mccoy appears well-developed and well-nourished. No distress.  HENT:  Head: Normocephalic and atraumatic.  Neck: Normal range of motion. Neck supple. No JVD present.  Cardiovascular: Normal rate, regular rhythm, normal heart sounds and intact distal pulses. Exam reveals no gallop and no friction rub.  No murmur heard. Pulmonary/Chest: Effort normal and breath sounds normal.  Abdominal: Soft. Bowel sounds are normal.  Musculoskeletal: Normal range of motion.  Trace ankle edema.  Wearing compression stockings.  Neurological: Anna Mccoy is alert and oriented to person, place, and time.  Skin: Skin is warm and dry.  Psychiatric: Anna Mccoy has a normal mood and affect. Her behavior is normal. Judgment and thought content normal.  Vitals reviewed.    ASSESSMENT:    1. Syncope, unspecified syncope type   2. Essential hypertension   3. Chronic diastolic heart failure (HCC)   4. Frailty    PLAN:    In order of problems listed above:  Syncope -Recently had syncopal episode due to hypotension after starting a low dose of hydralazine for elevated blood pressure.  -Blood pressure was elevated on arrival, 160/88, but improved to 134/82  after rest in the office.  -Anna Mccoy has had issues with multiple different blood pressure medicines or increasing her doses in the past including this recent episode of syncope.  I discussed with her and her daughters, leaving her blood pressure medicine regimen as it is and allowing her to run a slightly higher blood pressure.  We also discussed relaxation techniques for when her blood pressure does run a little higher. -Anna Mccoy may have been taking Lasix every day and we discussed taking it only as needed for increased swelling.  We also talked about getting at least 6-8 cups of liquid per day.  Hypertension -As above -Continue her current regimen as Anna Mccoy seems to be tolerating it well.  Chronic diastolic CHF -Anna Mccoy has Lasix to use as needed, her discussion with her daughter, Anna Mccoy may be taking her Lasix every day.  I discussed with her taking the Lasix as needed.  Anna Mccoy only has trace ankle edema and no other signs of fluid overload.  Deconditioning -The patient is very sedentary and Anna Mccoy is gotten weak.  Her daughters feel like Anna Mccoy would do well with physical therapy.  I have advised them to request this from her PCP as I think it is a good idea.  Medication Adjustments/Labs and Tests Ordered: Current medicines are reviewed at length with the patient today.  Concerns regarding medicines are outlined above. Labs and tests ordered and medication changes are outlined in the patient instructions below:  Patient Instructions  Medication Instructions:  Your physician recommends that you continue on your current medications as directed. Please refer to the Current Medication list given to you today.  If you need a refill on your cardiac medications before your next appointment, please call your pharmacy.   Lab work: None If you have labs (blood work) drawn today and your tests are completely normal, you will receive your results only by: Marland Kitchen MyChart Message (if you have MyChart) OR . A paper copy in the  mail If you have any lab test that is abnormal or we need to change your treatment, we will call you to review the results.  Testing/Procedures: None  Follow-Up: At [facility], you and your health needs are our priority.  As part of our continuing mission to provide you with exceptional heart care, we have created designated Provider Care Teams.  These Care Teams include your primary Cardiologist (physician) and Advanced Practice Providers (APPs -  Physician Assistants and Nurse Practitioners) who all work together to provide you with the care you need, when you need it. You will need a follow up appointment in 3 months.  Please call our office 2 months in advance to schedule this appointment.  You may see Tobias Alexander, MD or one of the following Advanced Practice Providers on your designated Care Team:   [place], PA-C Ronie Spies, PA-C . Jacolyn Reedy, PA-C  Any Other Special Instructions Will Be Listed Below (If Applicable).       Signed, Berton Bon, NP  06/07/2018 5:19 PM    [facility]

## 2018-06-11 DIAGNOSIS — H04123 Dry eye syndrome of bilateral lacrimal glands: Secondary | ICD-10-CM | POA: Diagnosis not present

## 2018-06-11 DIAGNOSIS — H01003 Unspecified blepharitis right eye, unspecified eyelid: Secondary | ICD-10-CM | POA: Diagnosis not present

## 2018-06-11 DIAGNOSIS — H16223 Keratoconjunctivitis sicca, not specified as Sjogren's, bilateral: Secondary | ICD-10-CM | POA: Diagnosis not present

## 2018-06-11 DIAGNOSIS — H1013 Acute atopic conjunctivitis, bilateral: Secondary | ICD-10-CM | POA: Diagnosis not present

## 2018-06-20 DIAGNOSIS — M0589 Other rheumatoid arthritis with rheumatoid factor of multiple sites: Secondary | ICD-10-CM | POA: Diagnosis not present

## 2018-06-26 ENCOUNTER — Ambulatory Visit: Payer: Medicare Other | Admitting: Certified Nurse Midwife

## 2018-07-01 DIAGNOSIS — R21 Rash and other nonspecific skin eruption: Secondary | ICD-10-CM | POA: Diagnosis not present

## 2018-07-01 DIAGNOSIS — R05 Cough: Secondary | ICD-10-CM | POA: Diagnosis not present

## 2018-07-17 ENCOUNTER — Ambulatory Visit: Payer: Medicare Other | Admitting: Certified Nurse Midwife

## 2018-07-18 ENCOUNTER — Ambulatory Visit: Payer: Medicare Other | Admitting: Certified Nurse Midwife

## 2018-07-23 DIAGNOSIS — M0589 Other rheumatoid arthritis with rheumatoid factor of multiple sites: Secondary | ICD-10-CM | POA: Diagnosis not present

## 2018-08-28 ENCOUNTER — Ambulatory Visit: Payer: Medicare Other | Admitting: Certified Nurse Midwife

## 2018-09-12 ENCOUNTER — Ambulatory Visit: Payer: Medicare Other | Admitting: Cardiology

## 2018-09-25 ENCOUNTER — Ambulatory Visit (INDEPENDENT_AMBULATORY_CARE_PROVIDER_SITE_OTHER): Payer: Medicare Other | Admitting: Cardiology

## 2018-09-25 ENCOUNTER — Encounter: Payer: Self-pay | Admitting: Cardiology

## 2018-09-25 VITALS — BP 138/82 | HR 76 | Ht <= 58 in | Wt 111.1 lb

## 2018-09-25 DIAGNOSIS — R6889 Other general symptoms and signs: Secondary | ICD-10-CM

## 2018-09-25 DIAGNOSIS — I110 Hypertensive heart disease with heart failure: Secondary | ICD-10-CM

## 2018-09-25 DIAGNOSIS — R5383 Other fatigue: Secondary | ICD-10-CM

## 2018-09-25 DIAGNOSIS — I10 Essential (primary) hypertension: Secondary | ICD-10-CM

## 2018-09-25 DIAGNOSIS — E559 Vitamin D deficiency, unspecified: Secondary | ICD-10-CM

## 2018-09-25 DIAGNOSIS — E039 Hypothyroidism, unspecified: Secondary | ICD-10-CM

## 2018-09-25 DIAGNOSIS — I5032 Chronic diastolic (congestive) heart failure: Secondary | ICD-10-CM | POA: Diagnosis not present

## 2018-09-25 NOTE — Progress Notes (Signed)
Cardiology Office Note:    Date:  09/25/2018   ID:  Anna Mccoy, DOB [birth year], MRN [id number]  PCP:  [name], MD  Cardiologist:  Tobias Alexander, MD  Referring MD: [name], MD   Chief Complaint  Patient presents with  . Follow-up    Hypertension, CHF    History of Present Illness:    Anna Mccoy is a 85 y.o. female with a past medical history significant for HTN, stroke/TIA, edema/SOB felt possibly r/t chronic diastolic CHF, rheumatoid arthritis, hypothyroidism, RBBB, chronic anemia, PVCs by prior EKG.   Patient has had issues in the past with low blood pressure and syncope when trying to treat an elevated blood pressure.  Now her goal blood pressure of <150/90.  She apparently had syncope after an increase in carvedilol so beta-blockers have been stopped.  In 04/2018 the patient's daughter found the patient to have a high blood pressure and the patient ended up at the hospital.  She was started on hydralazine 10 mg 3 times daily but return to the ED a few days later after becoming unresponsive and found to have a blood pressure of 63/30.  Hydralazine was discontinued.  I saw her in follow-up on 06/07/2018 at which time her blood pressure was mildly elevated at 160/88 but improved to 134/82 after rest in the office.  It was unclear at the time whether patient was taking Lasix every day or as needed.  I advised her to take Lasix only as needed and make sure she is getting adequate fluid intake.  The patient also appeared very deconditioned and I advised her to consult her PCP regarding possible physical therapy.  Anna Mccoy is here for 3 month follow up with her daughters. She feels like she is doing well. She is having eye problems but no chest pain. She has DOE with walking from one room to the other at home. This has been present for a few months. She has mild ankle edema and is wearing compression stockings and therapeutic shoes. She denies orthopnea or PND. She  takes a lasix (1/2 pill) almost everyday except when she is going out.   Her PCP tried to increase her BP med a couple of weeks ago but her daughters declined due to pt's past issues with hypotension.   Her daughters state that a nurse comes to the home occasionally and Home BPs 130's/80's, down to the 110's on one occasion.   Past Medical History:  Diagnosis Date  . Anemia   . Arthritis   . Cerebral infarction due to unspecified mechanism   . 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 Chronic diarrhea of unknown origin 12/04/2015  . Chronic diastolic CHF (congestive heart failure) (HCC)   . CVA (cerebral infarction) 02/02/2015  . DDD (degenerative disc disease), lumbar   . Edema   . Essential tremor 09/29/2015  . Gait disturbance 06/06/2017  . Hx-TIA (transient ischemic attack)   . Hypertension   . Hypertensive heart disease with heart failure (HCC) 01/13/2016  . Hypertrophic toenail 09/28/2015  . Hypothyroidism    HYPOTHYROIDISM  . Impacted cerumen of right ear 01/10/2018  . Kyphosis (acquired) (postural) 10/07/2016  . Menopausal state age 19  . Mitral valve regurgitation    TRIVIAL  . Osteoporosis 09/28/2015  . Peripheral neuropathy 09/28/2015  . Post-menopausal bleeding 02/2011   Endo Biopsy 7/12 benign, no hyperplasia  . Procidentia of uterus    uses pessary  . PVC's (premature ventricular  contractions)   . RBBB   . Rheumatoid arthritis(714.0)   . Seizure cerebral   . Sudden idiopathic hearing loss of right ear 01/18/2018  . Syncope 11/10/2017  . Vitamin D deficiency disease     Past Surgical History:  Procedure Laterality Date  . CHOLECYSTECTOMY, LAPAROSCOPIC  2002  . COLONOSCOPY W/ BIOPSIES  6/09   sigmoid diverticuli recheck 5 years  . EYE SURGERY  04/24/14   Cataract sx in right eye with lens replacement.  Marland Kitchen. LAMINECTOMY AND MICRODISCECTOMY LUMBAR SPINE  01/23/09  . VAGINAL DELIVERY     x5    Current Medications: Current Meds  Medication Sig  .  amLODipine (NORVASC) 5 MG tablet Take 5 mg by mouth daily.  Marland Kitchen. aspirin 81 MG tablet Take 81 mg by mouth daily.   Marland Kitchen. atorvastatin (LIPITOR) 20 MG tablet Take 20 mg by mouth every evening.   . carvedilol (COREG) 12.5 MG tablet Take 12.5 mg by mouth 2 (two) times daily with a meal.  . divalproex (DEPAKOTE) 250 MG DR tablet Take 1 tablet (250 mg total) by mouth every 12 (twelve) hours.  Tery Sanfilippo. Docusate Sodium (COLACE PO) Take 1 tablet by mouth 2 (two) times daily.  . fluticasone (FLONASE) 50 MCG/ACT nasal spray Place 1 spray into both nostrils daily.   . folic acid (FOLVITE) 1 MG tablet Take 1 mg by mouth daily.  . furosemide (LASIX) 20 MG tablet Take 10 mg by mouth daily as needed for fluid.   . hypromellose (GENTEAL SEVERE) 0.3 % GEL ophthalmic ointment Place 1 application into both eyes at bedtime.  Marland Kitchen. leflunomide (ARAVA) 20 MG tablet Take 20 mg by mouth every other day.  . levothyroxine (SYNTHROID, LEVOTHROID) 75 MCG tablet Take 1 tablet by mouth daily.  . Multiple Vitamins-Minerals (OCUVITE PRESERVISION PO) Take 1 tablet by mouth 2 (two) times daily.   Marland Kitchen. olmesartan-hydrochlorothiazide (BENICAR HCT) 40-25 MG tablet Take 1 tablet by mouth daily.  Marland Kitchen. POLY-IRON 150 150 MG capsule Take 150 mg by mouth daily.   . polyethylene glycol (MIRALAX) packet Take 17 g by mouth as needed for mild constipation.   . potassium chloride SA (K-DUR,KLOR-CON) 20 MEQ tablet Take 20 mEq by mouth 2 (two) times daily.  Marland Kitchen. PRESCRIPTION MEDICATION Place 1 drop into both eyes 3 (three) times daily. Vital tears  . primidone (MYSOLINE) 50 MG tablet Take 1 tablet by mouth 2 (two) times daily.   Marland Kitchen. UNABLE TO FIND Take 1 capsule by mouth every evening. Ultra flora      Allergies:   Ace inhibitors; Ciprofloxacin; Lidocaine; Neosporin [neomycin-bacitracin zn-polymyx]; and Sulfa antibiotics   Social History   Socioeconomic History  . Marital status: Widowed    Spouse name: Not on file  . Number of children: 5  . Years of education:  7012  . Highest education level: Not on file  Occupational History  . Occupation: Retired   Engineer, productionocial Needs  . Financial resource strain: Not on file  . Food insecurity:    Worry: Not on file    Inability: Not on file  . Transportation needs:    Medical: Not on file    Non-medical: Not on file  Tobacco Use  . Smoking status: Never Smoker  . Smokeless tobacco: Never Used  Substance and Sexual Activity  . Alcohol use: No  . Drug use: No  . Sexual activity: Not Currently    Comment: Widowed 01/10/07 age 85  Lifestyle  . Physical activity:    Days per week:  Not on file    Minutes per session: Not on file  . Stress: Not on file  Relationships  . Social connections:    Talks on phone: Not on file    Gets together: Not on file    Attends religious service: Not on file    Active member of club or organization: Not on file    Attends meetings of clubs or organizations: Not on file    Relationship status: Not on file  Other Topics Concern  . Not on file  Social History Narrative   Patient is right handed, resides with daughter and sister.    Caffeine use: Coffee (drinks 1/2-1 cup per day)     Family History: The patient's family history includes Diabetes in her sister and sister; Hypertension in her mother, sister, and sister; Stroke in her mother. ROS:   Please see the history of present illness.     All other systems reviewed and are negative.  EKGs/Labs/Other Studies Reviewed:    The following studies were reviewed today:  Echocardiogram 11/11/2017 Study Conclusions - Left ventricle: The cavity size was normal. Systolic function was vigorous. The estimated ejection fraction was in the range of 65% to 70%. Wall motion was normal; there were no regional wall motion abnormalities. There was an increased relative contribution of atrial contraction to ventricular filling. Doppler parameters are consistent with abnormal left ventricular relaxation (grade 1 diastolic  dysfunction). - Aortic valve: Trileaflet; mildly thickened, mildly calcified leaflets. - Tricuspid valve: There was mild-moderate regurgitation. - Pulmonic valve: There was trivial regurgitation. - Pulmonary arteries: PA peak pressure: 32 mm Hg (S).   EKG:  EKG is not ordered today.    Recent Labs: 11/11/2017: Magnesium 1.9; TSH 2.218 05/08/2018: ALT 15; BUN 12; Creatinine, Ser 0.68; Hemoglobin 13.6; Platelets 201; Potassium 3.9; Sodium 136   Recent Lipid Panel    Component Value Date/Time   CHOL 197 02/03/2015 0705   TRIG 61 02/03/2015 0705   HDL 65 02/03/2015 0705   CHOLHDL 3.0 02/03/2015 0705   VLDL 12 02/03/2015 0705   LDLCALC 120 (H) 02/03/2015 0705    Physical Exam:    VS:  BP 138/82   Pulse 76   Ht 4\' 8"  (1.422 m)   Wt 111 lb 1.9 oz (50.4 kg)   LMP 08/14/1981 (Approximate)   SpO2 99%   BMI 24.91 kg/m     Wt Readings from Last 3 Encounters:  09/25/18 111 lb 1.9 oz (50.4 kg)  06/07/18 111 lb 9.6 oz (50.6 kg)  05/09/18 110 lb (49.9 kg)     Physical Exam  Constitutional: She is oriented to person, place, and time. No distress.  Frail elderly female  HENT:  Head: Normocephalic and atraumatic.  Right Ear: Decreased hearing is noted.  Left Ear: Decreased hearing is noted.  Neck: Normal range of motion. Neck supple. No JVD present.  Cardiovascular: Normal rate, regular rhythm, normal heart sounds and intact distal pulses. Exam reveals no gallop and no friction rub.  No murmur heard. Pulmonary/Chest: Effort normal and breath sounds normal. No respiratory distress. She has no wheezes. She has no rales.  Abdominal: Soft. Bowel sounds are normal.  Musculoskeletal:        General: Edema present.     Comments: Trace ankle edema. Pt wearing compression stockings.   Neurological: She is alert and oriented to person, place, and time.  Skin: Skin is warm and dry.  Psychiatric: She has a normal mood and affect. Her behavior is normal.  Judgment and thought content  normal.  Vitals reviewed.  ASSESSMENT:    1. Hypertensive heart disease with heart failure (HCC)   2. Chronic diastolic heart failure (HCC)   3. Fatigue, unspecified type   4. Activity intolerance   5. Essential hypertension   6. Vitamin D deficiency   7. Hypothyroidism, unspecified type    PLAN:    In order of problems listed above:  Hypertension -Patient has had issues on multiple different blood pressure medicines including hypotension with syncope.  We are allowing a blood pressure goal of <150/90. -She is on amlodipine 5 mg daily and Benicar HCT 40-25 mg daily.  Currently I would not increase or add antihypertensive due to her prior poor reactions. -BP initially elevated but came down after rest in the office.  -Will check renal function today with [facility].  Chronic diastolic CHF -Lasix 10 mg as needed and she takes it most days. She has mild ankle edema and DOE but I suspect the DOE is more related to deconditioning and age.   Deconditioning/activity intolerance/fatigue -Pt is mostly sedentary. She walks in a stooped position with a walker. I think that pt would benefit from home physical therapy to increase her mobility, strength and endurance. Pt and her daughters agree. I will send this note to her PCP for consideration of PT.  -Her daughter reports that pt had been diagnosed with Vitamin D deficiency in the past and was treated but is no longer taking supplementation. She would like to know if Vitamin D level is low again. I will check it today.   Hypothyroidism -Pt on supplementation. Last TSH checked last March. Will check TSH.    Medication Adjustments/Labs and Tests Ordered: Current medicines are reviewed at length with the patient today.  Concerns regarding medicines are outlined above. Labs and tests ordered and medication changes are outlined in the patient instructions below:  Patient Instructions  Medication Instructions:  Your physician recommends that you  continue on your current medications as directed. Please refer to the Current Medication list given to you today.  If you need a refill on your cardiac medications before your next appointment, please call your pharmacy.   Lab work: TODAY: BMET. TSH & VIT D   If you have labs (blood work) drawn today and your tests are completely normal, you will receive your results only by: Marland Kitchen MyChart Message (if you have MyChart) OR . A paper copy in the mail If you have any lab test that is abnormal or we need to change your treatment, we will call you to review the results.  Testing/Procedures: None  Follow-Up: At [facility], you and your health needs are our priority.  As part of our continuing mission to provide you with exceptional heart care, we have created designated Provider Care Teams.  These Care Teams include your primary Cardiologist (physician) and Advanced Practice Providers (APPs -  Physician Assistants and Nurse Practitioners) who all work together to provide you with the care you need, when you need it. You will need a follow up appointment in 6 months.  Please call our office 2 months in advance to schedule this appointment.  You may see Tobias Alexander, MD or one of the following Advanced Practice Providers on your designated Care Team:   [place], PA-C Ronie Spies, PA-C . Jacolyn Reedy, PA-C  Any Other Special Instructions Will Be Listed Below (If Applicable).       Signed, Berton Bon, NP  09/25/2018 4:03 PM  [facility]

## 2018-09-25 NOTE — Patient Instructions (Signed)
Medication Instructions:  Your physician recommends that you continue on your current medications as directed. Please refer to the Current Medication list given to you today.  If you need a refill on your cardiac medications before your next appointment, please call your pharmacy.   Lab work: TODAY: BMET. TSH & VIT D   If you have labs (blood work) drawn today and your tests are completely normal, you will receive your results only by: Marland Kitchen MyChart Message (if you have MyChart) OR . A paper copy in the mail If you have any lab test that is abnormal or we need to change your treatment, we will call you to review the results.  Testing/Procedures: None  Follow-Up: At [facility], you and your health needs are our priority.  As part of our continuing mission to provide you with exceptional heart care, we have created designated Provider Care Teams.  These Care Teams include your primary Cardiologist (physician) and Advanced Practice Providers (APPs -  Physician Assistants and Nurse Practitioners) who all work together to provide you with the care you need, when you need it. You will need a follow up appointment in 6 months.  Please call our office 2 months in advance to schedule this appointment.  You may see Tobias Alexander, MD or one of the following Advanced Practice Providers on your designated Care Team:   [place], PA-C Ronie Spies, PA-C . Jacolyn Reedy, PA-C  Any Other Special Instructions Will Be Listed Below (If Applicable).

## 2018-09-26 ENCOUNTER — Telehealth: Payer: Self-pay

## 2018-09-26 ENCOUNTER — Telehealth: Payer: Self-pay | Admitting: Cardiology

## 2018-09-26 LAB — BASIC METABOLIC PANEL
BUN/Creatinine Ratio: 19 (ref 12–28)
BUN: 13 mg/dL (ref 10–36)
CHLORIDE: 101 mmol/L (ref 96–106)
CO2: 27 mmol/L (ref 20–29)
Calcium: 9.4 mg/dL (ref 8.7–10.3)
Creatinine, Ser: 0.7 mg/dL (ref 0.57–1.00)
GFR calc Af Amer: 88 mL/min/{1.73_m2} (ref >59)
GFR calc non Af Amer: 76 mL/min/{1.73_m2} (ref >59)
GLUCOSE: 83 mg/dL (ref 65–99)
POTASSIUM: 4.5 mmol/L (ref 3.5–5.2)
Sodium: 141 mmol/L (ref 134–144)

## 2018-09-26 LAB — TSH: TSH: 8.68 u[IU]/mL — AB (ref 0.450–4.500)

## 2018-09-26 LAB — VITAMIN D 25 HYDROXY (VIT D DEFICIENCY, FRACTURES): VIT D 25 HYDROXY: 18.9 ng/mL — AB (ref 30.0–100.0)

## 2018-09-26 MED ORDER — VITAMIN D (ERGOCALCIFEROL) 1.25 MG (50000 UNIT) PO CAPS: 50000.0000 [IU] | ORAL_CAPSULE | ORAL | 0 refills | Status: DC

## 2018-09-26 NOTE — Telephone Encounter (Signed)
New Message ° ° ° ° °Patient calling for lab results. °

## 2018-09-26 NOTE — Telephone Encounter (Signed)
Orders

## 2018-10-02 ENCOUNTER — Telehealth: Payer: Self-pay | Admitting: Cardiology

## 2018-10-02 NOTE — Telephone Encounter (Signed)
New Message         Patient's daughter is calling to get the medication recommendation along with the notes from that appointment per Lizabeth Leyden) faxed to her PCP "Benedetto Goad" Phone # [phone].

## 2018-10-02 NOTE — Telephone Encounter (Signed)
Spoke to patient's daughter Talbert Forest) who requested that the OV notes and lab results/recommendations be faxed to Dr Andrey Campanile.  I told her that I would send them over.  She was thankful for the call.

## 2018-10-02 NOTE — Telephone Encounter (Signed)
Anna Leyden, NP CMA [name] is in assisting role today.

## 2018-11-29 ENCOUNTER — Other Ambulatory Visit: Payer: Self-pay | Admitting: Physician Assistant

## 2019-03-02 ENCOUNTER — Other Ambulatory Visit: Payer: Self-pay | Admitting: Physician Assistant

## 2019-05-12 ENCOUNTER — Emergency Department (HOSPITAL_COMMUNITY)
Admission: EM | Admit: 2019-05-12 | Discharge: 2019-05-12 | Disposition: A | Discharge status: Home / Self Care | Payer: Medicare Other | Attending: Emergency Medicine | Admitting: Emergency Medicine

## 2019-05-12 ENCOUNTER — Emergency Department (HOSPITAL_COMMUNITY): Payer: Medicare Other

## 2019-05-12 ENCOUNTER — Other Ambulatory Visit: Payer: Self-pay

## 2019-05-12 DIAGNOSIS — I10 Essential (primary) hypertension: Secondary | ICD-10-CM

## 2019-05-12 DIAGNOSIS — E039 Hypothyroidism, unspecified: Secondary | ICD-10-CM | POA: Insufficient documentation

## 2019-05-12 DIAGNOSIS — E876 Hypokalemia: Secondary | ICD-10-CM | POA: Diagnosis not present

## 2019-05-12 DIAGNOSIS — Z79899 Other long term (current) drug therapy: Secondary | ICD-10-CM | POA: Insufficient documentation

## 2019-05-12 DIAGNOSIS — R531 Weakness: Secondary | ICD-10-CM

## 2019-05-12 DIAGNOSIS — I110 Hypertensive heart disease with heart failure: Secondary | ICD-10-CM | POA: Diagnosis not present

## 2019-05-12 DIAGNOSIS — Z7982 Long term (current) use of aspirin: Secondary | ICD-10-CM | POA: Diagnosis not present

## 2019-05-12 DIAGNOSIS — I5032 Chronic diastolic (congestive) heart failure: Secondary | ICD-10-CM | POA: Insufficient documentation

## 2019-05-12 DIAGNOSIS — E875 Hyperkalemia: Secondary | ICD-10-CM

## 2019-05-12 LAB — URINALYSIS, ROUTINE W REFLEX MICROSCOPIC
Bilirubin Urine: NEGATIVE
Glucose, UA: NEGATIVE mg/dL
Hgb urine dipstick: NEGATIVE
Ketones, ur: NEGATIVE mg/dL
Nitrite: NEGATIVE
Protein, ur: NEGATIVE mg/dL
Specific Gravity, Urine: 1.013 (ref 1.005–1.030)
pH: 7 (ref 5.0–8.0)

## 2019-05-12 LAB — HEPATIC FUNCTION PANEL
ALT: 14 U/L (ref 0–44)
AST: 16 U/L (ref 15–41)
Albumin: 3.3 g/dL — ABNORMAL LOW (ref 3.5–5.0)
Alkaline Phosphatase: 50 U/L (ref 38–126)
Bilirubin, Direct: <0.1 mg/dL (ref 0.0–0.2)
Total Bilirubin: 0.3 mg/dL (ref 0.3–1.2)
Total Protein: 6.4 g/dL — ABNORMAL LOW (ref 6.5–8.1)

## 2019-05-12 LAB — CBC
HCT: 33.4 % — ABNORMAL LOW (ref 36.0–46.0)
Hemoglobin: 11.2 g/dL — ABNORMAL LOW (ref 12.0–15.0)
MCH: 32.1 pg (ref 26.0–34.0)
MCHC: 33.5 g/dL (ref 30.0–36.0)
MCV: 95.7 fL (ref 80.0–100.0)
Platelets: 148 10*3/uL — ABNORMAL LOW (ref 150–400)
RBC: 3.49 MIL/uL — ABNORMAL LOW (ref 3.87–5.11)
RDW: 15.6 % — ABNORMAL HIGH (ref 11.5–15.5)
WBC: 6.9 10*3/uL (ref 4.0–10.5)
nRBC: 0 % (ref 0.0–0.2)

## 2019-05-12 LAB — BASIC METABOLIC PANEL
Anion gap: 12 (ref 5–15)
BUN: 16 mg/dL (ref 8–23)
CO2: 24 mmol/L (ref 22–32)
Calcium: 8.9 mg/dL (ref 8.9–10.3)
Chloride: 100 mmol/L (ref 98–111)
Creatinine, Ser: 0.96 mg/dL (ref 0.44–1.00)
GFR calc Af Amer: 60 mL/min — ABNORMAL LOW (ref >60)
GFR calc non Af Amer: 51 mL/min — ABNORMAL LOW (ref >60)
Glucose, Bld: 94 mg/dL (ref 70–99)
Potassium: 5.3 mmol/L — ABNORMAL HIGH (ref 3.5–5.1)
Sodium: 136 mmol/L (ref 135–145)

## 2019-05-12 LAB — TSH: TSH: 0.518 u[IU]/mL (ref 0.350–4.500)

## 2019-05-12 MED ORDER — AMLODIPINE BESYLATE 5 MG PO TABS
5.0000 mg | ORAL_TABLET | Freq: Once | ORAL | Status: AC
  Administered 2019-05-12: 5 mg via ORAL
  Filled 2019-05-12: qty 1

## 2019-05-12 MED ORDER — IRBESARTAN 300 MG PO TABS
300.0000 mg | ORAL_TABLET | Freq: Once | ORAL | Status: AC
  Administered 2019-05-12: 300 mg via ORAL
  Filled 2019-05-12: qty 1

## 2019-05-12 NOTE — ED Notes (Signed)
Patient verbalizes understanding of discharge instructions. Opportunity for questioning and answers were provided. Armband removed by staff, pt discharged from ED via wheelchair to home.  

## 2019-05-12 NOTE — ED Provider Notes (Signed)
[facility] EMERGENCY DEPARTMENT Provider Note   CSN: [id number] Arrival date & time: 05/12/19  1326     History   Chief Complaint Chief Complaint  Patient presents with  . Weakness    HPI Anna Mccoy is a 85 y.o. female.     Patient with history of CVA on lasix and potassium, heart failure, hypothyroidism --presents to the emergency department today with complaint of generalized weakness and possible low blood pressure.  Patient lives at home with 2 of her daughters.  They report that she was more lethargic today, less talkative.  They checked her blood pressure and it was low.  EMS reported blood pressure of 58/palp.  Patient denies any other acute complaints.  Patient denies signs of stroke including: facial droop, slurred speech, aphasia, weakness/numbness in extremities, imbalance/trouble walking.  No chest pain or shortness of breath, cough, coronavirus contacts.  No fevers or chills.  No nausea, vomiting, or diarrhea.  No changes in the urine such as increased frequency urgency or odor.  Patient had a small area of redness on her left lower leg that is now resolving.  She also has a red area on the back of her head at the base of the neck in the occipital scalp that is itchy.  No other rashes.  Patient states that she is feeling much better now that she is in a room.     Past Medical History:  Diagnosis Date  . Anemia   . Arthritis   . Cerebral infarction due to unspecified mechanism   . 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 Chronic diarrhea of unknown origin 12/04/2015  . Chronic diastolic CHF (congestive heart failure) (HCC)   . CVA (cerebral infarction) 02/02/2015  . DDD (degenerative disc disease), lumbar   . Edema   . Essential tremor 09/29/2015  . Gait disturbance 06/06/2017  . Hx-TIA (transient ischemic attack)   . Hypertension   . Hypertensive heart disease with heart failure (HCC) 01/13/2016  . Hypertrophic toenail 09/28/2015   . Hypothyroidism    HYPOTHYROIDISM  . Impacted cerumen of right ear 01/10/2018  . Kyphosis (acquired) (postural) 10/07/2016  . Menopausal state age 31  . Mitral valve regurgitation    TRIVIAL  . Osteoporosis 09/28/2015  . Peripheral neuropathy 09/28/2015  . Post-menopausal bleeding 02/2011   Endo Biopsy 7/12 benign, no hyperplasia  . Procidentia of uterus    uses pessary  . PVC's (premature ventricular contractions)   . RBBB   . Rheumatoid arthritis(714.0)   . Seizure cerebral   . Sudden idiopathic hearing loss of right ear 01/18/2018  . Syncope 11/10/2017  . Vitamin D deficiency disease     Patient Active Problem List   Diagnosis Date Noted  . Rectal bleeding 02/13/2018    Class: History of  . Dysfunction of right eustachian tube 02/06/2018  . Presbycusis of both ears 02/06/2018  . Sudden idiopathic hearing loss of right ear 01/18/2018  . Bilateral impacted cerumen 01/10/2018  . Hospital discharge follow-up 12/02/2017  . Other fatigue   . Syncope 11/10/2017  . Encounter for Medicare annual wellness exam 10/08/2017  . Abnormal urinalysis 08/22/2017  . Gait disturbance 06/06/2017  . Frailty 10/07/2016  . Kyphosis (acquired) (postural) 10/07/2016  . Bilateral leg edema 01/13/2016  . Hypertensive heart disease with heart failure (HCC) 01/13/2016  . Chronic diastolic heart failure (HCC) 01/13/2016  . Blurred vision, bilateral 12/31/2015  . Bilateral dry eyes 12/31/2015  . Chronic fatigue 12/15/2015  .  Chronic diarrhea of unknown origin 12/04/2015  . Essential tremor 09/29/2015  . Aphthous ulcer 09/28/2015  . Cardiac arrhythmia 09/28/2015  . Cataract 09/28/2015  . 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 History of headache 09/28/2015  . Hypertrophic toenail 09/28/2015  . Hypokalemia 09/28/2015  . Osteoporosis 09/28/2015  . Peripheral neuropathy 09/28/2015  . Seborrheic dermatitis 09/28/2015  . Situational anxiety 09/28/2015  . Vitamin D deficiency  09/28/2015  . Weight loss 09/28/2015  . History of stroke 09/28/2015  . UTI (lower urinary tract infection)   . Seizure cerebral   . CVA (cerebral infarction) 02/02/2015  . History of Stroke in 2016 without residual deficits (HCC) 02/02/2015  . Rheumatoid arthritis (HCC) 02/02/2015  . Headache 02/02/2015  . Hypertension 02/02/2015  . Hypothyroidism 02/02/2015  . Cerebral infarction due to unspecified mechanism   . Uterovaginal prolapse, complete 04/18/2013  . Complete uterovaginal prolapse 04/18/2013    Past Surgical History:  Procedure Laterality Date  . CHOLECYSTECTOMY, LAPAROSCOPIC  2002  . COLONOSCOPY W/ BIOPSIES  6/09   sigmoid diverticuli recheck 5 years  . EYE SURGERY  04/24/14   Cataract sx in right eye with lens replacement.  Marland Kitchen. LAMINECTOMY AND MICRODISCECTOMY LUMBAR SPINE  01/23/09  . VAGINAL DELIVERY     x5     OB History    Gravida  5   Para  5   Term  5   Preterm  0   AB  0   Living  5     SAB  0   TAB  0   Ectopic  0   Multiple  0   Live Births  5            Home Medications    Prior to Admission medications   Medication Sig Start Date End Date Taking? Authorizing Provider  amLODipine (NORVASC) 5 MG tablet Take 5 mg by mouth daily.    [provider]  aspirin 81 MG tablet Take 81 mg by mouth daily.     [provider]  atorvastatin (LIPITOR) 20 MG tablet Take 20 mg by mouth every evening.     [provider]  carvedilol (COREG) 12.5 MG tablet TAKE 1 TABLET BY MOUTH TWICE DAILY 03/03/19   Dyann KiefLenze, Michele M, PA-C  divalproex (DEPAKOTE) 250 MG DR tablet Take 1 tablet (250 mg total) by mouth every 12 (twelve) hours. 02/05/15   Rhetta MuraSamtani, Jai-Gurmukh, MD  Docusate Sodium (COLACE PO) Take 1 tablet by mouth 2 (two) times daily.    [provider]  fluticasone (FLONASE) 50 MCG/ACT nasal spray Place 1 spray into both nostrils daily.  01/14/18   [provider]  folic acid (FOLVITE) 1 MG tablet Take 1 mg by  mouth daily.    [provider]  furosemide (LASIX) 20 MG tablet Take 10 mg by mouth daily as needed for fluid.  10/20/16   [provider]  hypromellose (GENTEAL SEVERE) 0.3 % GEL ophthalmic ointment Place 1 application into both eyes at bedtime.    [provider]  leflunomide (ARAVA) 20 MG tablet Take 20 mg by mouth every other day.    [provider]  levothyroxine (SYNTHROID, LEVOTHROID) 75 MCG tablet Take 1 tablet by mouth daily. 05/31/18   [provider]  Multiple Vitamins-Minerals (OCUVITE PRESERVISION PO) Take 1 tablet by mouth 2 (two) times daily.     [provider]  olmesartan-hydrochlorothiazide (BENICAR HCT) 40-25 MG tablet Take 1 tablet by mouth daily. 11/05/17  [provider]  POLY-IRON 150 150 MG capsule Take 150 mg by mouth daily.  10/13/15   [provider]  polyethylene glycol (MIRALAX) packet Take 17 g by mouth as needed for mild constipation.     [provider]  potassium chloride SA (K-DUR,KLOR-CON) 20 MEQ tablet Take 20 mEq by mouth 2 (two) times daily.    [provider]  PRESCRIPTION MEDICATION Place 1 drop into both eyes 3 (three) times daily. Vital tears    [provider]  primidone (MYSOLINE) 50 MG tablet Take 1 tablet by mouth 2 (two) times daily.  09/20/16   [provider]  UNABLE TO FIND Take 1 capsule by mouth every evening. Ultra flora     [provider]  Vitamin D, Ergocalciferol, (DRISDOL) 1.25 MG (50000 UT) CAPS capsule Take 1 capsule (50,000 Units total) by mouth every 7 (seven) days. 09/26/18   Berton BonHammond, Janine, NP    Family History Family History  Problem Relation Age of Onset  . Hypertension Mother   . Stroke Mother   . Hypertension Sister   . Diabetes Sister   . Hypertension Sister   . Diabetes Sister     Social History Social History   Tobacco Use  . Smoking status: Never Smoker  . Smokeless tobacco: Never Used  Substance Use  Topics  . Alcohol use: No  . Drug use: No     Allergies   Ace inhibitors, Ciprofloxacin, Lidocaine, Neosporin [neomycin-bacitracin zn-polymyx], and Sulfa antibiotics   Review of Systems Review of Systems  Constitutional: Positive for fatigue. Negative for diaphoresis and fever.  HENT: Negative for rhinorrhea and sore throat.   Eyes: Negative for redness.  Respiratory: Negative for cough and shortness of breath.   Cardiovascular: Negative for chest pain, palpitations and leg swelling.  Gastrointestinal: Negative for abdominal pain, diarrhea, nausea and vomiting.  Genitourinary: Negative for dysuria.  Musculoskeletal: Negative for back pain, myalgias and neck pain.  Skin: Positive for rash.  Neurological: Positive for weakness. Negative for syncope, light-headedness and headaches.  Psychiatric/Behavioral: The patient is not nervous/anxious.      Physical Exam Updated Vital Signs BP (!) 144/76 (BP Location: Left Arm)   Pulse 68   Temp 98.1 F (36.7 C) (Oral)   Resp 15   Ht 4\' 10"  (1.473 m)   Wt 49.9 kg   LMP 08/14/1981 (Approximate)   SpO2 98%   BMI 22.99 kg/m   Physical Exam Vitals signs and nursing note reviewed.  Constitutional:      Appearance: She is well-developed. She is not diaphoretic.  HENT:     Head: Normocephalic and atraumatic.     Right Ear: Tympanic membrane, ear canal and external ear normal.     Left Ear: Tympanic membrane, ear canal and external ear normal.     Nose: Nose normal.     Mouth/Throat:     Mouth: Mucous membranes are moist.     Pharynx: Oropharynx is clear. Uvula midline.  Eyes:     General: Lids are normal.     Extraocular Movements:     Right eye: No nystagmus.     Left eye: No nystagmus.     Conjunctiva/sclera: Conjunctivae normal.     Pupils: Pupils are equal, round, and reactive to light.  Neck:     Musculoskeletal: Normal range of motion and neck supple. No muscular tenderness.     Vascular: Normal carotid pulses. No  carotid bruit or JVD.     Trachea: Trachea normal.  No tracheal deviation.  Cardiovascular:     Rate and Rhythm: Normal rate and regular rhythm.     Pulses: No decreased pulses.     Heart sounds: Normal heart sounds, S1 normal and S2 normal. No murmur.  Pulmonary:     Effort: Pulmonary effort is normal. No respiratory distress.     Breath sounds: Rales (R base only, mild) present. No wheezing.  Chest:     Chest wall: No tenderness.  Abdominal:     General: Bowel sounds are normal.     Palpations: Abdomen is soft.     Tenderness: There is no abdominal tenderness. There is no guarding or rebound.  Musculoskeletal: Normal range of motion.     Cervical back: She exhibits normal range of motion, no tenderness and no bony tenderness.  Skin:    General: Skin is warm and dry.     Coloration: Skin is not pale.     Comments: Small patch of erythema, with slight flaking on posterior scalp and neck, consistent with dermatitis. No signs of cellulitis.   Small hyperpigmented area L ankle, healing. No cellulitis.   Neurological:     Mental Status: She is alert and oriented to person, place, and time.     GCS: GCS eye subscore is 4. GCS verbal subscore is 5. GCS motor subscore is 6.     Cranial Nerves: No cranial nerve deficit.     Sensory: No sensory deficit.     Coordination: Coordination normal.     Deep Tendon Reflexes: Reflexes are normal and symmetric.      ED Treatments / Results  Labs (all labs ordered are listed, but only abnormal results are displayed) Labs Reviewed  BASIC METABOLIC PANEL - Abnormal; Notable for the following components:      Result Value   Potassium 5.3 (*)    GFR calc non Af Amer 51 (*)    GFR calc Af Amer 60 (*)    All other components within normal limits  CBC - Abnormal; Notable for the following components:   RBC 3.49 (*)    Hemoglobin 11.2 (*)    HCT 33.4 (*)    RDW 15.6 (*)    Platelets 148 (*)    All other components within normal limits   URINALYSIS, ROUTINE W REFLEX MICROSCOPIC - Abnormal; Notable for the following components:   APPearance HAZY (*)    Leukocytes,Ua SMALL (*)    Bacteria, UA FEW (*)    Non Squamous Epithelial 0-5 (*)    All other components within normal limits  HEPATIC FUNCTION PANEL - Abnormal; Notable for the following components:   Total Protein 6.4 (*)    Albumin 3.3 (*)    All other components within normal limits  URINE CULTURE  TSH  CBG MONITORING, ED    ED ECG REPORT   Date: 05/12/2019  Rate: 68  Rhythm: normal sinus rhythm  QRS Axis: normal  Intervals: normal  ST/T Wave abnormalities: normal  Conduction Disutrbances:right bundle branch block and left anterior fascicular block  Narrative Interpretation:   Old EKG Reviewed: unchanged  I have personally reviewed the EKG tracing and agree with the computerized printout as noted.  Radiology Dg Chest Port 1 View  Result Date: 05/12/2019 CLINICAL DATA:  Weakness.  Chest pain. EXAM: PORTABLE CHEST 1 VIEW COMPARISON:  November 10, 2016 FINDINGS: The heart size and mediastinal contours are within normal limits. Both lungs are clear. The visualized skeletal structures are unremarkable. IMPRESSION: No active disease. Electronically  Signed   By: Dorise Bullion III M.D   On: 05/12/2019 18:35    Procedures Procedures (including critical care time)  Medications Ordered in ED Medications  irbesartan (AVAPRO) tablet 300 mg (300 mg Oral Given 05/12/19 2100)  amLODipine (NORVASC) tablet 5 mg (5 mg Oral Given 05/12/19 2100)     Initial Impression / Assessment and Plan / ED Course  I have reviewed the triage vital signs and the nursing notes.  Pertinent labs & imaging results that were available during my care of the patient were reviewed by me and considered in my medical decision making (see chart for details).        Patient seen and examined.  Initial work-up with mild hyperkalemia.  UA with some white cells but not overwhelming for UTI.   Urine culture ordered.  Does not appear to be appreciably fluid overloaded.   Will check EKG, CXR, orthostatics.   Vital signs reviewed and are as follows: BP (!) 144/76 (BP Location: Left Arm)   Pulse 68   Temp 98.1 F (36.7 C) (Oral)   Resp 15   Ht 4\' 10"  (1.473 m)   Wt 49.9 kg   LMP 08/14/1981 (Approximate)   SpO2 98%   BMI 22.99 kg/m   EKG reviewed: unchanged, non-ischemic.   Patient discussed with and seen by Dr. Tomi Bamberger.  Work-up is reassuring.  Patient is actually becoming more hypertensive here.  Patient feels good and is back to her baseline.  Family is comfortable with taking her home.  Patient will check her blood pressure in the morning and evening before taking her blood pressure medications.  If her blood pressure is below 130/90, she will skip the dose of blood pressure medication to avoid taking her blood pressure go too low.  She will also hold potassium for the next 4 days given her slightly elevated level today.  Strongly encourage PCP follow-up this week for recheck.  Encouraged return to the emergency department if she has any lightheadedness or passing out spells, shortness of breath, chest pain, new symptoms or other concerns.  Final Clinical Impressions(s) / ED Diagnoses   Final diagnoses:  Generalized weakness  Hypertension, unspecified type  Hyperkalemia   Patient with lethargy, generalized weakness, and possible low blood pressure spell today.  Patient has been hypertensive during her evaluation in the emergency department and is back to her normal baseline.  She denies any chest pain or shortness of breath.  EKG is nonischemic without arrhythmia.  No prolonged QTC or other concerning findings.  Lab work-up without significant anemia.  No signs of infection.  Question UTI, urine culture pending.  Patient has done well during emergency department stay.  She is comfortable with discharged home.  Family is comfortable with monitoring her and having her follow-up  with her doctor.  Unclear if low blood pressure reading was accurate today.  Regardless, with no evidence of persistent symptoms or additional episodes of low blood pressure here, do not feel that the patient requires admission for monitorin.  Family seems reliable to return if symptoms change or worsen.  ED Discharge Orders    None       Carlisle Cater, Hershal Coria 05/12/19 2150    Dorie Rank, MD 05/14/19 [phone]

## 2019-05-12 NOTE — ED Triage Notes (Signed)
Pt BIB [facility] for eval of generalized weakness. Pt reported increased fatigue to daughter this AM. EMS reports initial BP 58 palp, but unsure of accuracy. Pt denies any other acute complaints aside from increased fatigue

## 2019-05-12 NOTE — Discharge Instructions (Signed)
Please read and follow all provided instructions.  Your diagnoses today include:  1. Generalized weakness   2. Hypertension, unspecified type   3. Hyperkalemia     Tests performed today include:  An EKG of your heart  A chest x-ray  Cardiac enzymes - a blood test for heart muscle damage  Blood counts and electrolytes - low potassium  Thyroid test - is normal  Urine test - no definite infection, urine culture sent  Vital signs. See below for your results today.   Medications prescribed:   None  Take any prescribed medications only as directed.  Follow-up instructions: Please follow-up with your primary care provider as soon as you can for further evaluation of your symptoms.   Please check your blood pressure in the morning and the evening and record the values. If the top number is less than 130 or the bottom number is less than 90, please skip your blood pressure medication for that one time to prevent your blood pressure from getting too low.   You should also skip your potassium supplement and start again on Saturday to allow your potassium levels to go back to normal. Please have this rechecked by your primary doctor in 1 week.   Return instructions:  SEEK IMMEDIATE MEDICAL ATTENTION IF:  You have severe chest pain, especially if the pain is crushing or pressure-like and spreads to the arms, back, neck, or jaw, or if you have sweating, nausea (feeling sick to your stomach), or shortness of breath. THIS IS AN EMERGENCY. Don't wait to see if the pain will go away. Get medical help at once. Call 911 or [phone] (operator). DO NOT drive yourself to the hospital.   Your chest pain gets worse and does not go away with rest.   You have an attack of chest pain lasting longer than usual, despite rest and treatment with the medications your caregiver has prescribed.   You wake from sleep with chest pain or shortness of breath.  You feel dizzy or faint.  You have chest pain not  typical of your usual pain for which you originally saw your caregiver.   You have any other emergent concerns regarding your health.   Your vital signs today were: BP (!) 172/86    Pulse 62    Temp 98.1 F (36.7 C) (Oral)    Resp (!) 27    Ht 4\' 10"  (1.473 m)    Wt 49.9 kg    LMP 08/14/1981 (Approximate)    SpO2 99%    BMI 22.99 kg/m  If your blood pressure (BP) was elevated above 135/85 this visit, please have this repeated by your doctor within one month. --------------

## 2019-05-14 LAB — URINE CULTURE: Culture: 70000 — AB

## 2019-05-15 ENCOUNTER — Telehealth: Payer: Self-pay | Admitting: *Deleted

## 2019-05-15 NOTE — Telephone Encounter (Signed)
Post ED Visit - Positive Culture Follow-up  Culture report reviewed by antimicrobial stewardship pharmacist: [place] Team []  Elenor Quinones, Pharm.D. [x]  Heide Guile, Pharm.D., BCPS AQ-ID []  Parks Neptune, Pharm.D., BCPS []  Alycia Rossetti, Pharm.D., BCPS []  [place], Pharm.D., BCPS, AAHIVP []  Legrand Como, Pharm.D., BCPS, AAHIVP []  Salome Arnt, PharmD, BCPS []  Johnnette Gourd, PharmD, BCPS []  Hughes Better, PharmD, BCPS []  Leeroy Cha, PharmD []  Laqueta Linden, PharmD, BCPS []  Albertina Parr, PharmD  [place] Team []  Leodis Sias, PharmD []  Lindell Spar, PharmD []  Royetta Asal, PharmD []  Graylin Shiver, Rph []  Rema Fendt) Glennon Mac, PharmD []  Arlyn Dunning, PharmD []  Netta Cedars, PharmD []  Dia Sitter, PharmD []  Leone Haven, PharmD []  Gretta Arab, PharmD []  Theodis Shove, PharmD []  Peggyann Juba, PharmD []  Reuel Boom, PharmD   Positive urine culture Symptoms resolved and no further patient follow-up is required at this time.  Harlon Flor [facility] 05/15/2019, 9:44 AM

## 2019-06-01 DIAGNOSIS — E7800 Pure hypercholesterolemia, unspecified: Secondary | ICD-10-CM | POA: Insufficient documentation

## 2019-08-22 ENCOUNTER — Other Ambulatory Visit: Payer: Self-pay | Admitting: Family Medicine

## 2019-08-22 DIAGNOSIS — R1011 Right upper quadrant pain: Secondary | ICD-10-CM

## 2019-08-29 ENCOUNTER — Ambulatory Visit
Admission: RE | Admit: 2019-08-29 | Discharge: 2019-08-29 | Disposition: A | Discharge status: Home / Self Care | Payer: Medicare Other | Source: Ambulatory Visit | Attending: Family Medicine | Admitting: Family Medicine

## 2019-08-29 DIAGNOSIS — R1011 Right upper quadrant pain: Secondary | ICD-10-CM

## 2019-10-31 ENCOUNTER — Ambulatory Visit: Payer: Medicare Other

## 2019-11-05 ENCOUNTER — Encounter: Payer: Self-pay | Admitting: Certified Nurse Midwife

## 2019-11-24 ENCOUNTER — Ambulatory Visit: Payer: Medicare Other

## 2019-12-16 NOTE — Progress Notes (Signed)
86 y.o. [id number] Widowed Black or African American Not Hispanic or Latino female here for annual exam.  She has a h/o prolapse and had a #2 ring pessary in the past. She stopped using it in 2019.  No vaginal bleeding. C/O urinary frequency for the last few months, voids normal amounts. She can void every 30 minutes during the day. She is on a diuretic. Recent negative urine culture. She is drinking lots of water. She wears a pad to bed and leaks when she is sleeping. No leakage during the day, some urgency to void.  Normal bowel movements, some constipation BM at least every other day. Occasionally strains a little, has hemorrhoids recently had a bleeding. She takes daily colace.   Not bothered by prolapse.     Patient's last menstrual period was 08/14/1981 (approximate).          Sexually active: No.  The current method of family planning is post menopausal status.    Exercising: No.  Smoker:  no  Health Maintenance: Pap:  Unsure  History of abnormal Pap:  no MMG:  12/16/2010 Bi-rads 2  BMD:   No sure  Colonoscopy: years ago  TDaP:  Years ago  Gardasil: NA    reports that she has never smoked. She has never used smokeless tobacco. She reports that she does not drink alcohol or use drugs.  Past Medical History:  Diagnosis Date  . Anemia   . Arthritis   . Cerebral infarction due to unspecified mechanism   . 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 Chronic diarrhea of unknown origin 12/04/2015  . Chronic diastolic CHF (congestive heart failure) ([place])   . CVA (cerebral infarction) 02/02/2015  . DDD (degenerative disc disease), lumbar   . Edema   . Essential tremor 09/29/2015  . Gait disturbance 06/06/2017  . Hx-TIA (transient ischemic attack)   . Hypertension   . Hypertensive heart disease with heart failure ([place]) 01/13/2016  . Hypertrophic toenail 09/28/2015  . Hypothyroidism    HYPOTHYROIDISM  . Impacted cerumen of right ear 01/10/2018  . Kyphosis (acquired) (postural)  10/07/2016  . Menopausal state age 4  . Mitral valve regurgitation    TRIVIAL  . Osteoporosis 09/28/2015  . Peripheral neuropathy 09/28/2015  . Post-menopausal bleeding 02/2011   Endo Biopsy 7/12 benign, no hyperplasia  . Procidentia of uterus    uses pessary  . PVC's (premature ventricular contractions)   . RBBB   . Rheumatoid arthritis(714.0)   . Seizure cerebral ([place])   . Sudden idiopathic hearing loss of right ear 01/18/2018  . Syncope 11/10/2017  . Vitamin D deficiency disease     Past Surgical History:  Procedure Laterality Date  . CHOLECYSTECTOMY, LAPAROSCOPIC  2002  . COLONOSCOPY W/ BIOPSIES  6/09   sigmoid diverticuli recheck 5 years  . EYE SURGERY  04/24/14   Cataract sx in right eye with lens replacement.  Marland Kitchen LAMINECTOMY AND MICRODISCECTOMY LUMBAR SPINE  01/23/09  . VAGINAL DELIVERY     x5    Current Outpatient Medications  Medication Sig Dispense Refill  . amLODipine (NORVASC) 5 MG tablet Take 5 mg by mouth daily.    Marland Kitchen aspirin 81 MG tablet Take 81 mg by mouth daily.     Marland Kitchen atorvastatin (LIPITOR) 20 MG tablet Take 20 mg by mouth every evening.     . carvedilol (COREG) 12.5 MG tablet TAKE 1 TABLET BY MOUTH TWICE DAILY 180 tablet 0  . divalproex (DEPAKOTE) 250 MG DR tablet Take  1 tablet (250 mg total) by mouth every 12 (twelve) hours. 60 tablet 0  . Docusate Sodium (COLACE PO) Take 1 tablet by mouth 2 (two) times daily.    . fluticasone (FLONASE) 50 MCG/ACT nasal spray Place 1 spray into both nostrils daily.     . folic acid (FOLVITE) 1 MG tablet Take 1 mg by mouth daily.    . furosemide (LASIX) 20 MG tablet Take 10 mg by mouth daily as needed for fluid.   5  . hypromellose (GENTEAL SEVERE) 0.3 % GEL ophthalmic ointment Place 1 application into both eyes at bedtime.    Marland Kitchen levothyroxine (SYNTHROID) 88 MCG tablet TAKE 1 TABLET BY MOUTH EVERY DAY ON EMPTY STOMACH FOR THYROID FUNCTION    . olmesartan-hydrochlorothiazide (BENICAR HCT) 40-25 MG tablet Take 1 tablet by mouth daily.     Marland Kitchen POLY-IRON 150 150 MG capsule Take 150 mg by mouth daily.   0  . potassium chloride SA (K-DUR,KLOR-CON) 20 MEQ tablet Take 20 mEq by mouth 2 (two) times daily.    Marland Kitchen PRESCRIPTION MEDICATION Place 1 drop into both eyes 3 (three) times daily. Vital tears    . primidone (MYSOLINE) 50 MG tablet Take 1 tablet by mouth 2 (two) times daily.   5  . UNABLE TO FIND Take 1 capsule by mouth every evening. Ultra flora     . Vitamin D, Ergocalciferol, (DRISDOL) 1.25 MG (50000 UT) CAPS capsule Take 1 capsule (50,000 Units total) by mouth every 7 (seven) days. 4 capsule 0   No current facility-administered medications for this visit.    Family History  Problem Relation Age of Onset  . Hypertension Mother   . Stroke Mother   . Hypertension Sister   . Diabetes Sister   . Hypertension Sister   . Diabetes Sister     Review of Systems  Eyes: Positive for visual disturbance.       Almost blind   Gastrointestinal: Positive for constipation.  Genitourinary: Positive for frequency and urgency.  All other systems reviewed and are negative.   Exam:   BP (!) 160/72   Pulse 66   Temp 98.6 F (37 C)   Ht 4\' 10"  (1.473 m)   LMP 08/14/1981 (Approximate)   SpO2 100%   BMI 22.99 kg/m   Weight change: @WEIGHTCHANGE @ Height:   Height: 4\' 10"  (147.3 cm)  Ht Readings from Last 3 Encounters:  12/18/19 4\' 10"  (1.473 m)  05/12/19 4\' 10"  (1.473 m)  09/25/18 4\' 8"  (1.422 m)    General appearance: alert, cooperative and appears stated age Head: Normocephalic, without obvious abnormality, atraumatic Neck: no adenopathy, supple, symmetrical, trachea midline and thyroid normal to inspection and palpation Lungs: clear to auscultation bilaterally Cardiovascular: regular rate and rhythm Breasts: normal appearance, no masses or tenderness Abdomen: soft, non-tender; non distended,  no masses,  no organomegaly Extremities: extremities normal, atraumatic, no cyanosis or edema Skin: Skin color, texture, turgor  normal. No rashes or lesions Lymph nodes: Cervical, supraclavicular, and axillary nodes normal. No abnormal inguinal nodes palpated Neurologic: Grossly normal   Pelvic: External genitalia:  no lesions              Urethra:  normal appearing urethra with no masses, tenderness or lesions              Bartholins and Skenes: normal                 Vagina: very atrophic appearing vagina with a mild cystocele, grade 1  Cervix: no lesions               Bimanual Exam:  Uterus:  not appreciably enlarged, not tender.               Adnexa: no mass, fullness, tenderness               Rectovaginal: Confirms               Anus:  normal sphincter tone, no lesions  Bladder felt full on exam, as soon as the exam was done she needed to void.  Patient voided, PVR 300 cc  Carolynn Serve chaperoned for the exam.  A:  Well Woman with normal exam  Urinary frequency, normal urine culture last month. On a diuretic. Not sure if she is emptying.   P:   No pap needed  No mammogram or colonoscopy  Will send home with a voiding diary and a hat. Consider referral to Urology.   Check a PVR now  Addendum: PVR 300 cc Will refer to Urology. She will do the voiding diary  CC: Dr Andrey Campanile

## 2019-12-17 ENCOUNTER — Other Ambulatory Visit: Payer: Self-pay

## 2019-12-18 ENCOUNTER — Ambulatory Visit (INDEPENDENT_AMBULATORY_CARE_PROVIDER_SITE_OTHER): Payer: Medicare Other | Admitting: Obstetrics and Gynecology

## 2019-12-18 ENCOUNTER — Encounter: Payer: Self-pay | Admitting: Obstetrics and Gynecology

## 2019-12-18 VITALS — BP 160/72 | HR 66 | Temp 98.6°F | Ht <= 58 in

## 2019-12-18 DIAGNOSIS — R339 Retention of urine, unspecified: Secondary | ICD-10-CM

## 2019-12-18 DIAGNOSIS — Z124 Encounter for screening for malignant neoplasm of cervix: Secondary | ICD-10-CM

## 2019-12-18 DIAGNOSIS — N952 Postmenopausal atrophic vaginitis: Secondary | ICD-10-CM | POA: Diagnosis not present

## 2019-12-18 DIAGNOSIS — R350 Frequency of micturition: Secondary | ICD-10-CM | POA: Diagnosis not present

## 2019-12-18 DIAGNOSIS — N8111 Cystocele, midline: Secondary | ICD-10-CM | POA: Diagnosis not present

## 2019-12-18 DIAGNOSIS — Z01419 Encounter for gynecological examination (general) (routine) without abnormal findings: Secondary | ICD-10-CM

## 2019-12-22 ENCOUNTER — Telehealth: Payer: Self-pay | Admitting: Obstetrics and Gynecology

## 2019-12-22 ENCOUNTER — Encounter: Payer: Self-pay | Admitting: Obstetrics and Gynecology

## 2019-12-22 NOTE — Telephone Encounter (Signed)
[facility]  Phone Number: [phone]  Voiding Diary ~ Mazal Faires attached.   Also, is there a Insurance underwriter closer than [place] for the referral? Lucia Estelle, [place], or [place]? Thanks.

## 2019-12-23 NOTE — Telephone Encounter (Signed)
Voiding diary reviewed. She voids anywhere from 50 to just over 100 ml of urine at a time. This goes along with her urinary retention. I'm glad she is getting in with Urology soon.

## 2019-12-23 NOTE — Telephone Encounter (Signed)
Per review of Epic, patient is scheduled for appt with [facility] [place], Dr. Retta Diones on 01/06/20.   Call to patients daughter, Anna Mccoy, ok per dpr. Would like new referral to [facility] in [place] if ok to wait for appt. Denies any new symptoms. Anna Mccoy will have Dr. Oscar La review voiding diary and I will return call. Daughter will keep Urology appt as currently scheduled for now.   Copy of voiding diary printed and to Dr. Oscar La for review.

## 2019-12-23 NOTE — Telephone Encounter (Signed)
Reviewed with Dr. Oscar La. Call returned to patients daughter, [name]. Recommended patient keep earliest appt with urology for evaluation of urinary retention, daughter agreeable. Offered to place referral to [facility] in [place], daughter declined. Advised I have forwarded voiding diary to Dr. Oscar La to review, our office will f/u if any additional recommendations. Daughter agreeable. Daughter is aware to return call to office if any new symptoms or concerns.   Routing to Dr. Shirley Friar.

## 2019-12-24 NOTE — Telephone Encounter (Signed)
Call to patients daughter, [name], ok per dpr. Advised as seen below. Daughter verbalizes understanding, will plan to keep appt as scheduled with Urology. Is aware to return call to office if any questions or concerns.   Encounter closed.

## 2020-01-06 ENCOUNTER — Encounter: Payer: Self-pay | Admitting: Urology

## 2020-01-06 ENCOUNTER — Other Ambulatory Visit: Payer: Self-pay

## 2020-01-06 ENCOUNTER — Ambulatory Visit (INDEPENDENT_AMBULATORY_CARE_PROVIDER_SITE_OTHER): Payer: Medicare Other | Admitting: Urology

## 2020-01-06 VITALS — BP 162/74 | HR 76 | Temp 95.0°F | Ht 59.0 in | Wt 110.0 lb

## 2020-01-06 DIAGNOSIS — R3129 Other microscopic hematuria: Secondary | ICD-10-CM

## 2020-01-06 DIAGNOSIS — R339 Retention of urine, unspecified: Secondary | ICD-10-CM | POA: Diagnosis not present

## 2020-01-06 LAB — BLADDER SCAN AMB NON-IMAGING: Scan Result: 276.5

## 2020-01-06 LAB — POCT URINALYSIS DIPSTICK
Bilirubin, UA: NEGATIVE
Blood, UA: NEGATIVE
Glucose, UA: NEGATIVE
Ketones, UA: NEGATIVE
Nitrite, UA: NEGATIVE
Protein, UA: POSITIVE — AB
Spec Grav, UA: 1.01 (ref 1.010–1.025)
Urobilinogen, UA: NEGATIVE E.U./dL — AB
pH, UA: 8 (ref 5.0–8.0)

## 2020-01-06 MED ORDER — BETHANECHOL CHLORIDE 10 MG PO TABS: 10.0000 mg | ORAL_TABLET | Freq: Two times a day (BID) | ORAL | 11 refills | Status: DC

## 2020-01-06 NOTE — Progress Notes (Signed)
Urological Symptom Review  Patient is experiencing the following symptoms: Frequent urination Hard to postpone urination Leakage of urine Trouble starting stream Weak stream   Review of Systems  Gastrointestinal (upper)  : Negative for upper GI symptoms  Gastrointestinal (lower) : Negative for lower GI symptoms  Constitutional : Negative for symptoms  Skin: Negative for skin symptoms  Eyes: Blurred vision  Ear/Nose/Throat : Negative for Ear/Nose/Throat symptoms  Hematologic/Lymphatic: Negative for Hematologic/Lymphatic symptoms  Cardiovascular : Leg swelling  Respiratory : Shortness of breath  Endocrine: Negative for endocrine symptoms  Musculoskeletal: Joint pain  Neurological: Negative for neurological symptoms  Psychologic: Negative for psychiatric symptoms

## 2020-01-06 NOTE — Progress Notes (Signed)
H&P  Chief Complaint: Incomplete Bladder Emptying   History of Present Illness:  5.25.2021: Anna Mccoy is a 86 y.o. year old female referred today by her gynecologist for issues with incomplete bladder emptying that she first developed around 3 mo ago. She states that she will now have a strong urge to urinate but find herself unable to void any significant amount of urine even when straining. Of note, she has a hx of bladder prolapse that was managed with pessary but she is no longer using this (she cannot recall why she is no longer using this). She also has a hx of urge incontinence for which she is wearing pads (1 QD). She has limited mobility but is able to navigate her house and make it to the restroom without much issue with assistance of her walker. She has UUI. She has around 2-3 UTI's per year -- these are never symptomatic and are instead incidentally dx'd per UA. Her main concern today is this recent change in her ability to empty her bladder. On average, she finds her sx's quite bothersome (rating them an 8/10).  Past Medical History:  Diagnosis Date  . Anemia   . Arthritis   . Cerebral infarction due to unspecified mechanism   . 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 Chronic diarrhea of unknown origin 12/04/2015  . Chronic diastolic CHF (congestive heart failure) ([place])   . CVA (cerebral infarction) 02/02/2015  . DDD (degenerative disc disease), lumbar   . Edema   . Essential tremor 09/29/2015  . Gait disturbance 06/06/2017  . Hx-TIA (transient ischemic attack)   . Hypertension   . Hypertensive heart disease with heart failure ([place]) 01/13/2016  . Hypertrophic toenail 09/28/2015  . Hypothyroidism    HYPOTHYROIDISM  . Impacted cerumen of right ear 01/10/2018  . Kyphosis (acquired) (postural) 10/07/2016  . Menopausal state age 76  . Mitral valve regurgitation    TRIVIAL  . Osteoporosis 09/28/2015  . Peripheral neuropathy 09/28/2015  . Post-menopausal bleeding  02/2011   Endo Biopsy 7/12 benign, no hyperplasia  . Procidentia of uterus    uses pessary  . PVC's (premature ventricular contractions)   . RBBB   . Rheumatoid arthritis(714.0)   . Seizure cerebral ([place])   . Sudden idiopathic hearing loss of right ear 01/18/2018  . Syncope 11/10/2017  . Vitamin D deficiency disease     Past Surgical History:  Procedure Laterality Date  . CHOLECYSTECTOMY, LAPAROSCOPIC  2002  . COLONOSCOPY W/ BIOPSIES  6/09   sigmoid diverticuli recheck 5 years  . EYE SURGERY  04/24/14   Cataract sx in right eye with lens replacement.  Marland Kitchen LAMINECTOMY AND MICRODISCECTOMY LUMBAR SPINE  01/23/09  . VAGINAL DELIVERY     x5    Home Medications:  (Not in a hospital admission)   Allergies:  Allergies  Allergen Reactions  . Ace Inhibitors Cough  . Ciprofloxacin Diarrhea  . Lidocaine Other (See Comments)    unknown  . Neosporin [Neomycin-Bacitracin Zn-Polymyx] Other (See Comments)    unknown  . Sulfa Antibiotics Other (See Comments)    unknown    Family History  Problem Relation Age of Onset  . Hypertension Mother   . Stroke Mother   . Hypertension Sister   . Diabetes Sister   . Hypertension Sister   . Diabetes Sister     Social History:  reports that she has never smoked. She has never used smokeless tobacco. She reports that she does not drink alcohol  or use drugs.  ROS: Urological Symptom Review  Patient is experiencing the following symptoms: Frequent urination Hard to postpone urination Leakage of urine Trouble starting stream Weak stream   Review of Systems  Gastrointestinal (upper)  : Negative for upper GI symptoms  Gastrointestinal (lower) : Negative for lower GI symptoms  Constitutional : Negative for symptoms  Skin: Negative for skin symptoms  Eyes: Blurred vision  Ear/Nose/Throat : Negative for Ear/Nose/Throat symptoms  Hematologic/Lymphatic: Negative for Hematologic/Lymphatic symptoms  Cardiovascular : Leg  swelling  Respiratory : Shortness of breath  Endocrine: Negative for endocrine symptoms  Musculoskeletal: Joint pain  Neurological: Negative for neurological symptoms  Psychologic: Negative for psychiatric symptoms         Urological Symptom Review  Patient is experiencing the following symptoms: Frequent urination Hard to postpone urination Leakage of urine Trouble starting stream Weak stream  Review of Systems  Gastrointestinal (upper)  : Negative for upper GI symptoms Gastrointestinal (lower) : Negative for lower GI symptoms Constitutional : Negative for symptoms Skin: Negative for skin symptoms Eyes: Blurred vision Ear/Nose/Throat : Negative for Ear/Nose/Throat symptoms Hematologic/Lymphatic: Negative for Hematologic/Lymphatic symptoms Cardiovascular : Leg swelling Respiratory : Shortness of breath Endocrine: Negative for endocrine symptoms Musculoskeletal: Joint pain Neurological: Negative for neurological symptoms Psychologic: Negative for psychiatric symptoms           Physical Exam:  Vital signs in last 24 hours: Temp:  [95 F (35 C)] 95 F (35 C) (05/25 1031) Pulse Rate:  [76] 76 (05/25 1031) BP: (162)/(74) 162/74 (05/25 1031) Weight:  [110 lb (49.9 kg)] 110 lb (49.9 kg) (05/25 1031) General:  Alert and oriented, No acute distress, Using wheelchair today. HEENT: Normocephalic, atraumatic Neck: No JVD or lymphadenopathy Cardiovascular: Regular rate  Lungs: Normal inspiratory/expiratory excursion Neurologic: Grossly intact  Laboratory Data:  Results for orders placed or performed in visit on 01/06/20 (from the past 24 hour(s))  POCT urinalysis dipstick     Status: Abnormal   Collection Time: 01/06/20 10:49 AM  Result Value Ref Range   Color, UA lt yellow    Clarity, UA clear    Glucose, UA Negative Negative   Bilirubin, UA neg    Ketones, UA neg    Spec Grav, UA 1.010 1.010 - 1.025   Blood, UA neg    pH, UA 8.0  5.0 - 8.0   Protein, UA Positive (A) Negative   Urobilinogen, UA negative (A) 0.2 or 1.0 E.U./dL   Nitrite, UA neg    Leukocytes, UA Large (3+) (A) Negative   Appearance clear    Odor     No results found for this or any previous visit (from the past 240 hour(s)). Creatinine: No results for input(s): CREATININE in the last 168 hours.  Radiologic Imaging: No results found.  Impression/Assessment:  PVR today 276.5 mL -- this is only slightly decreased since last measured per her gynecologist. While she many benefit from resuming pessary use, her bladder position was largely normal according to her most recent pelvic exam in spite of her minor prolapse. Her sx's also may just be attributable to a weakening bladder.She has made some attempts at double voiding but this is not very effective for her -- will start on trial of bethanechol .  Plan:  1. Discussed some non-medical management strategies including continued attempts at double voiding  2. Start on trial of bethanechol   3. Return in 2 mo to check-in w/ sx's   4. Will call to schedule RUS within the next  few weeks.   Luella Cook 01/06/2020, 11:23 AM  Bertram Millard. Chantia Amalfitano MD

## 2020-01-13 ENCOUNTER — Ambulatory Visit (HOSPITAL_COMMUNITY)
Admission: RE | Admit: 2020-01-13 | Discharge: 2020-01-13 | Disposition: A | Discharge status: Home / Self Care | Payer: Medicare Other | Source: Ambulatory Visit | Attending: Urology | Admitting: Urology

## 2020-01-13 ENCOUNTER — Other Ambulatory Visit: Payer: Self-pay

## 2020-01-13 DIAGNOSIS — R3129 Other microscopic hematuria: Secondary | ICD-10-CM | POA: Diagnosis present

## 2020-01-16 ENCOUNTER — Telehealth: Payer: Self-pay | Admitting: Obstetrics and Gynecology

## 2020-01-16 NOTE — Telephone Encounter (Signed)
Patient's daughter Saddie Benders (on dpr) calling to go over results of bladder ultrasound. [phone]

## 2020-01-16 NOTE — Telephone Encounter (Signed)
Spoke with patients daughter, [name], ok per dpr.  She is requesting to review 01/13/20 Renal US results ordered by urology.   Advised she would have to f/u with ordering provider for them to review results with her. [name] verbalizes understanding and is agreeable, she will contact urology. She was thankful for call.   Routing to provider for final review. Patient is agreeable to disposition. Will close encounter.

## 2020-01-21 ENCOUNTER — Telehealth: Payer: Self-pay

## 2020-01-21 NOTE — Telephone Encounter (Signed)
Pt and drt notified.

## 2020-01-22 ENCOUNTER — Telehealth: Payer: Self-pay | Admitting: Urology

## 2020-01-26 NOTE — Telephone Encounter (Signed)
Notified pt. And drt.

## 2020-01-26 NOTE — Telephone Encounter (Signed)
-----   Message from Ferdinand Lango, RN sent at 01/26/2020  8:47 AM EDT -----  ----- Message ----- From: Marcine Matar, MD Sent: 01/20/2020   8:36 AM EDT To: Dalia Heading, LPN, Hope Cobb, LPN, #  Notify pt that U/S images show nml kidneys, bladder still not draining completely but no other mgmt needed @ present ----- Message ----- From: Ferdinand Lango, RN Sent: 01/14/2020   8:41 AM EDT To: Marcine Matar, MD  Please review

## 2020-02-10 ENCOUNTER — Ambulatory Visit: Payer: Medicare Other | Admitting: Urology

## 2020-03-09 ENCOUNTER — Ambulatory Visit: Payer: Medicare Other | Admitting: Urology

## 2020-04-13 ENCOUNTER — Other Ambulatory Visit: Payer: Self-pay

## 2020-04-13 ENCOUNTER — Ambulatory Visit (INDEPENDENT_AMBULATORY_CARE_PROVIDER_SITE_OTHER): Payer: Medicare Other | Admitting: Urology

## 2020-04-13 ENCOUNTER — Encounter: Payer: Self-pay | Admitting: Urology

## 2020-04-13 VITALS — BP 146/82 | HR 67 | Temp 98.2°F | Ht 59.0 in | Wt 114.0 lb

## 2020-04-13 DIAGNOSIS — R339 Retention of urine, unspecified: Secondary | ICD-10-CM

## 2020-04-13 LAB — URINALYSIS, ROUTINE W REFLEX MICROSCOPIC
Bilirubin, UA: NEGATIVE
Glucose, UA: NEGATIVE
Ketones, UA: NEGATIVE
Nitrite, UA: NEGATIVE
RBC, UA: NEGATIVE
Specific Gravity, UA: 1.02 (ref 1.005–1.030)
Urobilinogen, Ur: 0.2 mg/dL (ref 0.2–1.0)
pH, UA: 6 (ref 5.0–7.5)

## 2020-04-13 LAB — MICROSCOPIC EXAMINATION
Epithelial Cells (non renal): >10 /hpf — AB (ref 0–10)
WBC, UA: >30 /hpf — AB (ref 0–5)

## 2020-04-13 NOTE — Progress Notes (Signed)
H&P  Chief Complaint: Followup of bladder issues  History of Present Illness: Anna Mccoy is a 86 y.o. year old female  8.31.2021: Pt here for annual f/u and PVR. Pt denies any recent UTIs or gross hematuria and reports an adequate FOS. Pt notes that bethanechol has alleviated her bladder retention. Pt must wear pads for urinary incontinence. Pt is pleased with her symptoms and submits no questions or concerns at this time.  (below copied from [facility] records): 5.25.2021: Anna Mccoy is a 86 y.o. year old female referred today by her gynecologist for issues with incomplete bladder emptying that she first developed around 3 mo ago. She states that she will now have a strong urge to urinate but find herself unable to void any significant amount of urine even when straining. Of note, she has a hx of bladder prolapse that was managed with pessary but she is no longer using this (she cannot recall why she is no longer using this). She also has a hx of urge incontinence for which she is wearing pads (1 QD). She has limited mobility but is able to navigate her house and make it to the restroom without much issue with assistance of her walker. She has UUI. She has around 2-3 UTI's per year -- these are never symptomatic and are instead incidentally dx'd per UA. Her main concern today is this recent change in her ability to empty her bladder. On average, she finds her sx's quite bothersome (rating them an 8/10).  Past Medical History:  Diagnosis Date  . Anemia   . Arthritis   . Cerebral infarction due to unspecified mechanism   . 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 Chronic diarrhea of unknown origin 12/04/2015  . Chronic diastolic CHF (congestive heart failure) ([facility])   . CVA (cerebral infarction) 02/02/2015  . DDD (degenerative disc disease), lumbar   . Edema   . Essential tremor 09/29/2015  . Gait disturbance 06/06/2017  . Hx-TIA (transient ischemic attack)   . Hypertension    . Hypertensive heart disease with heart failure ([facility]) 01/13/2016  . Hypertrophic toenail 09/28/2015  . Hypothyroidism    HYPOTHYROIDISM  . Impacted cerumen of right ear 01/10/2018  . Kyphosis (acquired) (postural) 10/07/2016  . Menopausal state age 18  . Mitral valve regurgitation    TRIVIAL  . Osteoporosis 09/28/2015  . Peripheral neuropathy 09/28/2015  . Post-menopausal bleeding 02/2011   Endo Biopsy 7/12 benign, no hyperplasia  . Procidentia of uterus    uses pessary  . PVC's (premature ventricular contractions)   . RBBB   . Rheumatoid arthritis(714.0)   . Seizure cerebral ([facility])   . Sudden idiopathic hearing loss of right ear 01/18/2018  . Syncope 11/10/2017  . Vitamin D deficiency disease     Past Surgical History:  Procedure Laterality Date  . CHOLECYSTECTOMY, LAPAROSCOPIC  2002  . COLONOSCOPY W/ BIOPSIES  6/09   sigmoid diverticuli recheck 5 years  . EYE SURGERY  04/24/14   Cataract sx in right eye with lens replacement.  Marland Kitchen LAMINECTOMY AND MICRODISCECTOMY LUMBAR SPINE  01/23/09  . VAGINAL DELIVERY     x5    Home Medications:  (Not in a hospital admission)   Allergies:  Allergies  Allergen Reactions  . Ace Inhibitors Cough  . Ciprofloxacin Diarrhea  . Lidocaine Other (See Comments)    unknown  . Neosporin [Neomycin-Bacitracin Zn-Polymyx] Other (See Comments)    unknown  . Sulfa Antibiotics Other (See Comments)  unknown    Family History  Problem Relation Age of Onset  . Hypertension Mother   . Stroke Mother   . Hypertension Sister   . Diabetes Sister   . Hypertension Sister   . Diabetes Sister     Social History:  reports that she has never smoked. She has never used smokeless tobacco. She reports that she does not drink alcohol and does not use drugs.  ROS: Urological Symptom Review  Patient is experiencing the following symptoms: Frequent urination Trouble starting stream   Review of Systems  Gastrointestinal (upper)  : Negative for upper GI  symptoms  Gastrointestinal (lower) : Negative for lower GI symptoms  Constitutional : Negative for symptoms  Skin: Skin rash/lesion  Eyes: Blurred vision  Ear/Nose/Throat : Negative for Ear/Nose/Throat symptoms  Hematologic/Lymphatic: Negative for Hematologic/Lymphatic symptoms  Cardiovascular : Negative for cardiovascular symptoms  Respiratory : Negative for respiratory symptoms  Endocrine: Negative for endocrine symptoms  Musculoskeletal: Joint pain  Neurological: Negative for neurological symptoms  Psychologic: Negative for psychiatric symptoms   Physical Exam:  Vital signs in last 24 hours: BP: ()/()  Arterial Line BP: ()/()  General:  Alert and oriented, No acute distress HEENT: Normocephalic, atraumatic Neck: No JVD or lymphadenopathy Cardiovascular: Regular rate  Lungs: Normal inspiratory/expiratory excursion Extremities: No edema Neurologic: Grossly intact  I have reviewed prior pt notes  I have reviewed notes from referring/previous physicians  I have reviewed urinalysis results  I have reviewed prior urine culture  Prior renal U/S 6.2.2021: IMPRESSION: 1. Normal sonographic evaluation of the kidneys. No nephrolithiasis or hydronephrosis. 2. Bladder diverticulum extending from the superior aspect of the bladder, also seen on prior CT from 2011.IMPRESSION: 1. Normal sonographic evaluation of the kidneys. No nephrolithiasis or hydronephrosis. 2. Bladder diverticulum extending from the superior aspect of the bladder, also seen on prior CT from 2011. Impression/Assessment:  Incomplete bladder emptying, doing better on bethanechol  Plan:  1. Pt advised to taper off of bethanechol as is tolerable but to resume regimen if urinary symptoms worsen.  2. F/U in 1 year for OV, PVR, and symptom recheck.  CC: Dr. Benedetto Goad  Kenney Houseman 04/13/2020, 1:17 PM  Bertram Millard. Nehal Witting MD

## 2020-04-13 NOTE — Progress Notes (Signed)
Urological Symptom Review  Patient is experiencing the following symptoms: Frequent urination Trouble starting stream   Review of Systems  Gastrointestinal (upper)  : Negative for upper GI symptoms  Gastrointestinal (lower) : Negative for lower GI symptoms  Constitutional : Negative for symptoms  Skin: Skin rash/lesion  Eyes: Blurred vision  Ear/Nose/Throat : Negative for Ear/Nose/Throat symptoms  Hematologic/Lymphatic: Negative for Hematologic/Lymphatic symptoms  Cardiovascular : Negative for cardiovascular symptoms  Respiratory : Negative for respiratory symptoms  Endocrine: Negative for endocrine symptoms  Musculoskeletal: Joint pain  Neurological: Negative for neurological symptoms  Psychologic: Negative for psychiatric symptoms

## 2020-05-20 ENCOUNTER — Encounter: Payer: Self-pay | Admitting: Urology

## 2020-05-20 DIAGNOSIS — R339 Retention of urine, unspecified: Secondary | ICD-10-CM

## 2020-05-21 MED ORDER — BETHANECHOL CHLORIDE 10 MG PO TABS: 10.0000 mg | ORAL_TABLET | Freq: Two times a day (BID) | ORAL | 11 refills | Status: DC

## 2020-05-30 ENCOUNTER — Encounter (HOSPITAL_COMMUNITY): Payer: Self-pay

## 2020-05-30 ENCOUNTER — Other Ambulatory Visit: Payer: Self-pay

## 2020-05-30 ENCOUNTER — Emergency Department (HOSPITAL_COMMUNITY)
Admission: EM | Admit: 2020-05-30 | Discharge: 2020-05-30 | Disposition: A | Discharge status: Home / Self Care | Payer: Medicare Other | Attending: Emergency Medicine | Admitting: Emergency Medicine

## 2020-05-30 ENCOUNTER — Emergency Department (HOSPITAL_COMMUNITY): Payer: Medicare Other

## 2020-05-30 DIAGNOSIS — Z8739 Personal history of other diseases of the musculoskeletal system and connective tissue: Secondary | ICD-10-CM

## 2020-05-30 DIAGNOSIS — W010XXA Fall on same level from slipping, tripping and stumbling without subsequent striking against object, initial encounter: Secondary | ICD-10-CM | POA: Insufficient documentation

## 2020-05-30 DIAGNOSIS — Z79899 Other long term (current) drug therapy: Secondary | ICD-10-CM | POA: Insufficient documentation

## 2020-05-30 DIAGNOSIS — I110 Hypertensive heart disease with heart failure: Secondary | ICD-10-CM | POA: Insufficient documentation

## 2020-05-30 DIAGNOSIS — W19XXXA Unspecified fall, initial encounter: Secondary | ICD-10-CM

## 2020-05-30 DIAGNOSIS — E039 Hypothyroidism, unspecified: Secondary | ICD-10-CM | POA: Insufficient documentation

## 2020-05-30 DIAGNOSIS — S29011A Strain of muscle and tendon of front wall of thorax, initial encounter: Secondary | ICD-10-CM

## 2020-05-30 DIAGNOSIS — I5032 Chronic diastolic (congestive) heart failure: Secondary | ICD-10-CM | POA: Diagnosis not present

## 2020-05-30 DIAGNOSIS — Y92002 Bathroom of unspecified non-institutional (private) residence single-family (private) house as the place of occurrence of the external cause: Secondary | ICD-10-CM | POA: Insufficient documentation

## 2020-05-30 DIAGNOSIS — R531 Weakness: Secondary | ICD-10-CM | POA: Diagnosis present

## 2020-05-30 DIAGNOSIS — N3000 Acute cystitis without hematuria: Secondary | ICD-10-CM

## 2020-05-30 LAB — URINALYSIS, ROUTINE W REFLEX MICROSCOPIC
Bilirubin Urine: NEGATIVE
Glucose, UA: NEGATIVE mg/dL
Hgb urine dipstick: NEGATIVE
Ketones, ur: 5 mg/dL — AB
Nitrite: NEGATIVE
Protein, ur: NEGATIVE mg/dL
Specific Gravity, Urine: 1.01 (ref 1.005–1.030)
pH: 8 (ref 5.0–8.0)

## 2020-05-30 LAB — CBC
HCT: 37.9 % (ref 36.0–46.0)
Hemoglobin: 12.7 g/dL (ref 12.0–15.0)
MCH: 31.8 pg (ref 26.0–34.0)
MCHC: 33.5 g/dL (ref 30.0–36.0)
MCV: 94.8 fL (ref 80.0–100.0)
Platelets: 232 10*3/uL (ref 150–400)
RBC: 4 MIL/uL (ref 3.87–5.11)
RDW: 14.4 % (ref 11.5–15.5)
WBC: 16.9 10*3/uL — ABNORMAL HIGH (ref 4.0–10.5)
nRBC: 0 % (ref 0.0–0.2)

## 2020-05-30 LAB — BASIC METABOLIC PANEL
Anion gap: 12 (ref 5–15)
BUN: 17 mg/dL (ref 8–23)
CO2: 26 mmol/L (ref 22–32)
Calcium: 9.5 mg/dL (ref 8.9–10.3)
Chloride: 92 mmol/L — ABNORMAL LOW (ref 98–111)
Creatinine, Ser: 0.85 mg/dL (ref 0.44–1.00)
GFR, Estimated: 59 mL/min — ABNORMAL LOW (ref >60)
Glucose, Bld: 93 mg/dL (ref 70–99)
Potassium: 4.3 mmol/L (ref 3.5–5.1)
Sodium: 130 mmol/L — ABNORMAL LOW (ref 135–145)

## 2020-05-30 LAB — CBG MONITORING, ED: Glucose-Capillary: 77 mg/dL (ref 70–99)

## 2020-05-30 MED ORDER — SODIUM CHLORIDE 0.9% FLUSH
3.0000 mL | Freq: Once | INTRAVENOUS | Status: AC
  Administered 2020-05-30: 3 mL via INTRAVENOUS

## 2020-05-30 MED ORDER — CEPHALEXIN 500 MG PO CAPS
500.0000 mg | ORAL_CAPSULE | Freq: Once | ORAL | Status: AC
  Administered 2020-05-30: 500 mg via ORAL
  Filled 2020-05-30: qty 1

## 2020-05-30 MED ORDER — ACETAMINOPHEN 325 MG PO TABS
650.0000 mg | ORAL_TABLET | Freq: Once | ORAL | Status: AC
  Administered 2020-05-30: 650 mg via ORAL
  Filled 2020-05-30: qty 2

## 2020-05-30 MED ORDER — SODIUM CHLORIDE 0.9 % IV BOLUS
500.0000 mL | Freq: Once | INTRAVENOUS | Status: AC
  Administered 2020-05-30: 500 mL via INTRAVENOUS

## 2020-05-30 MED ORDER — HYDROCODONE-ACETAMINOPHEN 5-325 MG PO TABS
1.0000 | ORAL_TABLET | Freq: Once | ORAL | Status: AC
  Administered 2020-05-30: 1 via ORAL
  Filled 2020-05-30: qty 1

## 2020-05-30 MED ORDER — CEPHALEXIN 500 MG PO CAPS: 500.0000 mg | ORAL_CAPSULE | Freq: Three times a day (TID) | ORAL | 0 refills | Status: DC

## 2020-05-30 NOTE — ED Triage Notes (Signed)
From home VIA EMS, she had a fall this morning in the bathroom. No reported injuries. She was recently diagnosed with a UTI. She has been taking antibiotics. EMS unable to take orthostatics because of weakness and dizziness, abnormal for patient.

## 2020-05-30 NOTE — ED Notes (Signed)
Staff attempted to ambulate pt with walker. She could not stand or bear weight.

## 2020-05-30 NOTE — ED Provider Notes (Signed)
[facility]   CSN: [id number] Arrival date & time: 05/30/20  1003     History Chief Complaint  Patient presents with  . Fall    Anna Mccoy is a 86 y.o. female with past medical history of anemia, arthritis, CVA, degenerative disc disease, hypertension, grade 1 diastolic heart failure that presents the emergency department today for mechanical fall. Not on any blood thinners.   Daughter is present in room who is able to tell some of history as well.  States that she was in standing position trying to wash her face, she tripped over her shoes and fell on her butt.  Patiet did not hit her head, no loss of consciousness.  Patient was unable to get off the floor due to weakness, son had to pick her up by her armpits, EMS also had to pick her up, they did injure her chest.  Patient is complaining of chest pain where they grabbed her.  Chest pain was not there before her fall.  States that chest pain occurs when she moves or when someone presses on her chest, no shortness of breath.  Chest pain does not radiate anywhere. No headache, vision changes, numbness, tingling, neck pain, abdominal pain, nausea, vomiting, back pain.  Was recently diagnosed with UTI, was put on nitrofurantoin on Friday.  According to EMS they were unable to take orthostatics due to weakness and dizziness.  Patient is not complaining of any dizziness currently, does feel weak.  Daughter states that she started feeling weak yesterday after taking nitrofurantoin.  No more urinary symptoms.  Per chart review patient had echo for syncope 2 years ago with left ventricular ejection fraction 65 to 70%.  HPI     Past Medical History:  Diagnosis Date  . Anemia   . Arthritis   . Cerebral infarction due to unspecified mechanism   . 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 Chronic diarrhea of unknown origin 12/04/2015  . Chronic diastolic CHF (congestive heart  failure) (HCC)   . CVA (cerebral infarction) 02/02/2015  . DDD (degenerative disc disease), lumbar   . Edema   . Essential tremor 09/29/2015  . Gait disturbance 06/06/2017  . Hx-TIA (transient ischemic attack)   . Hypertension   . Hypertensive heart disease with heart failure (HCC) 01/13/2016  . Hypertrophic toenail 09/28/2015  . Hypothyroidism    HYPOTHYROIDISM  . Impacted cerumen of right ear 01/10/2018  . Kyphosis (acquired) (postural) 10/07/2016  . Menopausal state age 60  . Mitral valve regurgitation    TRIVIAL  . Osteoporosis 09/28/2015  . Peripheral neuropathy 09/28/2015  . Post-menopausal bleeding 02/2011   Endo Biopsy 7/12 benign, no hyperplasia  . Procidentia of uterus    uses pessary  . PVC's (premature ventricular contractions)   . RBBB   . Rheumatoid arthritis(714.0)   . Seizure cerebral (HCC)   . Stroke [facility]) 1996   pt estimation  . Sudden idiopathic hearing loss of right ear 01/18/2018  . Syncope 11/10/2017  . Vitamin D deficiency disease     Patient Active Problem List   Diagnosis Date Noted  . Pure hypercholesterolemia 06/01/2019  . Rectal bleeding 02/13/2018    Class: History of  . Dysfunction of right eustachian tube 02/06/2018  . Presbycusis of both ears 02/06/2018  . Sudden idiopathic hearing loss of right ear 01/18/2018  . Bilateral impacted cerumen 01/10/2018  . Hospital discharge follow-up 12/02/2017  . Other fatigue   . Syncope 11/10/2017  .  Encounter for Medicare annual wellness exam 10/08/2017  . Abnormal urinalysis 08/22/2017  . Gait disturbance 06/06/2017  . Frailty 10/07/2016  . Kyphosis (acquired) (postural) 10/07/2016  . Bilateral leg edema 01/13/2016  . Hypertensive heart disease with heart failure (HCC) 01/13/2016  . Chronic diastolic heart failure (HCC) 01/13/2016  . Blurred vision, bilateral 12/31/2015  . Bilateral dry eyes 12/31/2015  . Chronic fatigue 12/15/2015  . Chronic diarrhea of unknown origin 12/04/2015  . Essential tremor  09/29/2015  . Aphthous ulcer 09/28/2015  . Cardiac arrhythmia 09/28/2015  . Cataract 09/28/2015  . 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 History of headache 09/28/2015  . Hypertrophic toenail 09/28/2015  . Hypokalemia 09/28/2015  . Osteoporosis 09/28/2015  . Peripheral neuropathy 09/28/2015  . Seborrheic dermatitis 09/28/2015  . Situational anxiety 09/28/2015  . Vitamin D deficiency 09/28/2015  . Weight loss 09/28/2015  . History of stroke 09/28/2015  . UTI (lower urinary tract infection)   . Seizure cerebral (HCC)   . CVA (cerebral infarction) 02/02/2015  . History of Stroke in 2016 without residual deficits (HCC) 02/02/2015  . Rheumatoid arthritis (HCC) 02/02/2015  . Headache 02/02/2015  . Hypertension 02/02/2015  . Hypothyroidism 02/02/2015  . Cerebral infarction due to unspecified mechanism   . Uterovaginal prolapse, complete 04/18/2013  . Complete uterovaginal prolapse 04/18/2013    Past Surgical History:  Procedure Laterality Date  . BACK SURGERY    . CHOLECYSTECTOMY, LAPAROSCOPIC  2002  . COLONOSCOPY W/ BIOPSIES  6/09   sigmoid diverticuli recheck 5 years  . EYE SURGERY  04/24/14   Cataract sx in right eye with lens replacement.  Marland Kitchen LAMINECTOMY AND MICRODISCECTOMY LUMBAR SPINE  01/23/09  . VAGINAL DELIVERY     x5     OB History    Gravida  5   Para  5   Term  5   Preterm  0   AB  0   Living  5     SAB  0   TAB  0   Ectopic  0   Multiple  0   Live Births  5           Family History  Problem Relation Age of Onset  . Hypertension Mother   . Stroke Mother   . Hypertension Sister   . Diabetes Sister   . Hypertension Sister   . Diabetes Sister     Social History   Tobacco Use  . Smoking status: Never Smoker  . Smokeless tobacco: Never Used  Vaping Use  . Vaping Use: Never used  Substance Use Topics  . Alcohol use: No  . Drug use: No    Home Medications Prior to Admission medications   Medication Sig Start  Date End Date Taking? Authorizing Provider  amLODipine (NORVASC) 5 MG tablet Take 5 mg by mouth daily.    [provider]  aspirin 81 MG tablet Take 81 mg by mouth daily.     [provider]  atorvastatin (LIPITOR) 20 MG tablet Take 20 mg by mouth every evening.     [provider]  bethanechol (URECHOLINE) 10 MG tablet Take 1 tablet (10 mg total) by mouth 2 (two) times daily. 05/21/20   Marcine Matar, MD  carvedilol (COREG) 12.5 MG tablet TAKE 1 TABLET BY MOUTH TWICE DAILY 03/03/19   Dyann Kief, PA-C  divalproex (DEPAKOTE) 250 MG DR tablet Take 1 tablet (250 mg total) by mouth every 12 (twelve) hours. 02/05/15   Rhetta Mura, MD  Docusate Sodium (COLACE PO) Take 1 tablet by mouth 2 (two) times daily.    [provider]  fluticasone (FLONASE) 50 MCG/ACT nasal spray Place 1 spray into both nostrils daily.  01/14/18   [provider]  folic acid (FOLVITE) 1 MG tablet Take 1 mg by mouth daily.    [provider]  furosemide (LASIX) 20 MG tablet Take 10 mg by mouth daily as needed for fluid.  10/20/16   [provider]  hypromellose (GENTEAL SEVERE) 0.3 % GEL ophthalmic ointment Place 1 application into both eyes at bedtime.    [provider]  KLOR-CON 10 10 MEQ tablet Take 10 mEq by mouth daily. 03/03/20   [provider]  leflunomide (ARAVA) 20 MG tablet Take 20 mg by mouth as directed. 03/03/20   [provider]  levothyroxine (SYNTHROID) 88 MCG tablet TAKE 1 TABLET BY MOUTH EVERY DAY ON EMPTY STOMACH FOR THYROID FUNCTION 12/06/19   [provider]  olmesartan-hydrochlorothiazide (BENICAR HCT) 40-25 MG tablet Take 1 tablet by mouth daily. 11/05/17   [provider]  POLY-IRON 150 150 MG capsule Take 150 mg by mouth daily.  10/13/15   [provider]  POLY-IRON 150 FORTE 150-25-1 MG-MCG-MG CAPS Take by mouth. 03/03/20   [provider]  potassium chloride SA  (K-DUR,KLOR-CON) 20 MEQ tablet Take 20 mEq by mouth 2 (two) times daily.    [provider]  PRESCRIPTION MEDICATION Place 1 drop into both eyes 3 (three) times daily. Vital tears    [provider]  primidone (MYSOLINE) 50 MG tablet Take 1 tablet by mouth 2 (two) times daily.  09/20/16   [provider]  REFRESH OPTIVE ADVANCED 0.5-1-0.5 % SOLN Apply 1 drop to eye every 2 (two) hours. 12/23/19   [provider]  TOBRADEX ophthalmic ointment APPLY A SMALL AMOUNT INTO BOTH EYES EVERY NIGHT AT BEDTIME 12/22/19   [provider]  triamcinolone cream (KENALOG) 0.1 % Apply topically 2 (two) times daily as needed. 02/07/20   [provider]  UNABLE TO FIND Take 1 capsule by mouth every evening. Ultra flora     [provider]    Allergies    Ace inhibitors, Ciprofloxacin, Lidocaine, Neosporin [neomycin-bacitracin zn-polymyx], and Sulfa antibiotics  Review of Systems   Review of Systems  Constitutional: Negative for chills, diaphoresis, fatigue and fever.  HENT: Negative for congestion, sore throat and trouble swallowing.   Eyes: Negative for pain and visual disturbance.  Respiratory: Negative for cough, shortness of breath and wheezing.   Cardiovascular: Negative for chest pain, palpitations and leg swelling.  Gastrointestinal: Negative for abdominal distention, abdominal pain, diarrhea, nausea and vomiting.  Genitourinary: Negative for difficulty urinating.  Musculoskeletal: Positive for arthralgias. Negative for back pain, neck pain and neck stiffness.  Skin: Negative for pallor.  Neurological: Positive for weakness. Negative for dizziness, facial asymmetry, speech difficulty, light-headedness, numbness and headaches.  Psychiatric/Behavioral: Negative for confusion.    Physical Exam Updated Vital Signs BP 114/70 (BP Location: Left Arm)   Pulse 68   Temp 98.7 F (37.1 C) (Oral)   Resp 16   Ht 4\' 11"  (1.499 m)   Wt 52.2 kg   LMP  08/14/1981 (Approximate)   SpO2 94%   BMI 23.23 kg/m   Physical Exam Constitutional:      General: She is not in acute distress.    Appearance: Normal appearance. She is not ill-appearing, toxic-appearing or diaphoretic.  HENT:     Head: Normocephalic and  atraumatic.     Mouth/Throat:     Mouth: Mucous membranes are moist.     Pharynx: Oropharynx is clear.  Eyes:     General: No scleral icterus.    Extraocular Movements: Extraocular movements intact.     Pupils: Pupils are equal, round, and reactive to light.  Cardiovascular:     Rate and Rhythm: Normal rate and regular rhythm.     Pulses: Normal pulses.     Heart sounds: Normal heart sounds.  Pulmonary:     Effort: Pulmonary effort is normal. No respiratory distress.     Breath sounds: Normal breath sounds. No stridor. No wheezing, rhonchi or rales.  Chest:     Chest wall: No tenderness.  Abdominal:     General: Abdomen is flat. There is no distension.     Palpations: Abdomen is soft.     Tenderness: There is no abdominal tenderness. There is no guarding or rebound.  Musculoskeletal:        General: No swelling or tenderness. Normal range of motion.     Cervical back: Normal range of motion and neck supple. No rigidity.     Right lower leg: No edema.     Left lower leg: No edema.     Comments: No cervical, thoracic or lumbar midline tenderness.  No ecchymosis or abrasions to rectum.  Able to range upper and lower extremities normally.  Skin:    General: Skin is warm and dry.     Capillary Refill: Capillary refill takes less than 2 seconds.     Coloration: Skin is not pale.  Neurological:     General: No focal deficit present.     Mental Status: She is alert and oriented to person, place, and time.     Comments: Alert. Clear speech. No facial droop. CNIII-XII grossly intact. Bilateral upper and lower extremities' sensation grossly intact. 5/5 symmetric strength with grip strength and with plantar and dorsi flexion  bilaterally. Patellar DTRs are 2+ and symmetric .   Psychiatric:        Mood and Affect: Mood normal.        Behavior: Behavior normal.     ED Results / Procedures / Treatments   Labs (all labs ordered are listed, but only abnormal results are displayed) Labs Reviewed  BASIC METABOLIC PANEL - Abnormal; Notable for the following components:      Result Value   Sodium 130 (*)    Chloride 92 (*)    GFR, Estimated 59 (*)    All other components within normal limits  CBC - Abnormal; Notable for the following components:   WBC 16.9 (*)    All other components within normal limits  URINALYSIS, ROUTINE W REFLEX MICROSCOPIC - Abnormal; Notable for the following components:   Ketones, ur 5 (*)    Leukocytes,Ua SMALL (*)    Bacteria, UA RARE (*)    All other components within normal limits  URINE CULTURE  CBG MONITORING, ED    EKG EKG Interpretation  Date/Time:  Sunday May 30 2020 10:27:20 EDT Ventricular Rate:  78 PR Interval:    QRS Duration: 153 QT Interval:  415 QTC Calculation: 473 R Axis:   -62 Text Interpretation: sinus rhythm Right bundle branch block No significant change since last tracing Confirmed by Linwood Dibbles [phone]) on 05/30/2020 10:43:32 AM   Radiology DG Chest 2 View  Result Date: 05/30/2020 CLINICAL DATA:  Chest pain after fall EXAM: CHEST - 2 VIEW COMPARISON:  May 12, 2019 FINDINGS: The lungs are clear. The heart size and pulmonary vascularity are normal. No adenopathy. There is degenerative change in the thoracic spine. No pneumothorax. No acute fracture appreciable. IMPRESSION: Lungs clear.  Cardiac silhouette normal.  No evident pneumothorax. Electronically Signed   By: Bretta Bang III M.D.   On: 05/30/2020 13:29    Procedures Procedures (including critical care time)  Medications Ordered in ED Medications  sodium chloride flush (NS) 0.9 % injection 3 mL (3 mLs Intravenous Given 05/30/20 1101)  sodium chloride 0.9 % bolus 500 mL (500  mLs Intravenous New Bag/Given 05/30/20 1127)  acetaminophen (TYLENOL) tablet 650 mg (650 mg Oral Given 05/30/20 1124)    ED Course  I have reviewed the triage vital signs and the nursing notes.  Pertinent labs & imaging results that were available during my care of the patient were reviewed by me and considered in my medical decision making (see chart for details).    MDM Rules/Calculators/A&P                         Anna Mccoy is a 86 y.o. female with past medical history of anemia, arthritis, CVA, degenerative disc disease, hypertension, grade 1 diastolic heart failure that presents the emergency department today for mechanical fall.  Patient appears well, normal neuro exam. No TTP to exam except chest.  Will obtain labs and get chest x-ray due to sternal pain.  Low suspicion for ACS, pain started after she was grabbed in these areas, is reproducible on exam.  In regards to weakness, will give some fluids at this time, pain control and reassess.    Work-up today remarkable for sodium of 130, is getting fluids for this slowly, patient does have heart failure, giving small boluses.  White count 16.9, this is most likely reactive.  In regards to urinalysis, there are small leukocytes with rare bacteria, does not appear striking for infection, however will send culture at this time, will have patient continue taking her Macrobid.  EKG interpreted me without any ischemia or arrhythmia, chest x-ray interpreted by me without any acute cardiopulmonary disease.  Upon reassessment patient states that her chest tenderness has become severe, every time she moves.  Pain only occurs when she moves, does not radiate anywhere.  Tylenol has not helped, however does not want narcotics.  Patient cannot bear weight or attempt walking due to pain, chest x-ray negative will obtain CT at the scan and give Norco, patient is agreeable to Norco.  Since patient is in so much pain, there might be a fracture that the  CT can detect.Orthostatics from lying and sitting normal, unable to assess standing since patient cannot stand.   Pt care was handed off to Dr. Denton Lank. Complete history and physical and current plan have been communicated.  Please refer to their note for the remainder of ED care and ultimate disposition.   I discussed this case with my attending physician who cosigned this note including patient's presenting symptoms, physical exam, and planned diagnostics and interventions. Attending physician stated agreement with plan or made changes to plan which were implemented.   Attending physician assessed patient at bedside.  Final Clinical Impression(s) / ED Diagnoses Final diagnoses:  Acute cystitis without hematuria  Accident due to mechanical fall without injury, initial encounter    Rx / DC Orders ED Discharge Orders    None       Farrel Gordon, PA-C 05/30/20 1821    Lynelle Doctor,  Cletis Athens, MD 05/31/20 [phone]

## 2020-05-30 NOTE — ED Notes (Addendum)
Orthostatic BP readings: 161/87 lying, 167/87 sitting, unable to tolerate standing

## 2020-05-30 NOTE — Discharge Instructions (Addendum)
It was our pleasure to provide your ER care today - we hope that you feel better.  Take keflex (antibiotic) as prescribed.   Take acetaminophen as need for pain.   Fall precautions.   We've made a home health referral - they should be contacting you tomorrow.   Return to ER if worse, new symptoms, fevers, trouble breathing, or other concern.

## 2020-05-30 NOTE — ED Notes (Signed)
[facility] called to arrange transport home

## 2020-05-30 NOTE — ED Notes (Signed)
Brought pt some coffee and some broth, pulled her up and repositioned her in bed.  Called [facility] and was advised that pt is next to be picked up for transport home

## 2020-05-31 LAB — URINE CULTURE

## 2020-06-03 ENCOUNTER — Telehealth: Payer: Self-pay | Admitting: *Deleted

## 2020-06-03 NOTE — Telephone Encounter (Signed)
TOC CM received a call from pt's dtr stating they have not heard from [facility]. Reviewed pt's chart and HH orders. Dtr states pt had [facility] in the past. She has wheelchair, Rolling Walker, bedside commode and transfer bench at home. She is wanting a different aide this time. Contacted [facility] rep, Aggie Cosier and office accepted referral. The aide is new to the company. Contacted dtr to make aware [facility] will reach out to her to arrange [facility]. Pt lives in home with 3 adult children and has great support in the home. ED provider updated. Also to ensure to add [facility] consults when ordering [facility].   Isidoro Donning RN CCM, [facility] ED TOC CM [phone]

## 2021-01-20 ENCOUNTER — Emergency Department (HOSPITAL_COMMUNITY): Payer: Medicare Other

## 2021-01-20 ENCOUNTER — Encounter (HOSPITAL_COMMUNITY): Payer: Self-pay

## 2021-01-20 ENCOUNTER — Other Ambulatory Visit: Payer: Self-pay

## 2021-01-20 ENCOUNTER — Emergency Department (HOSPITAL_COMMUNITY)
Admission: EM | Admit: 2021-01-20 | Discharge: 2021-01-21 | Disposition: A | Discharge status: Home / Self Care | Payer: Medicare Other | Attending: Emergency Medicine | Admitting: Emergency Medicine

## 2021-01-20 DIAGNOSIS — N3289 Other specified disorders of bladder: Secondary | ICD-10-CM | POA: Diagnosis not present

## 2021-01-20 DIAGNOSIS — I110 Hypertensive heart disease with heart failure: Secondary | ICD-10-CM | POA: Diagnosis not present

## 2021-01-20 DIAGNOSIS — E039 Hypothyroidism, unspecified: Secondary | ICD-10-CM | POA: Insufficient documentation

## 2021-01-20 DIAGNOSIS — M48061 Spinal stenosis, lumbar region without neurogenic claudication: Secondary | ICD-10-CM | POA: Insufficient documentation

## 2021-01-20 DIAGNOSIS — K625 Hemorrhage of anus and rectum: Secondary | ICD-10-CM | POA: Insufficient documentation

## 2021-01-20 DIAGNOSIS — I5032 Chronic diastolic (congestive) heart failure: Secondary | ICD-10-CM | POA: Diagnosis not present

## 2021-01-20 DIAGNOSIS — M159 Polyosteoarthritis, unspecified: Secondary | ICD-10-CM | POA: Insufficient documentation

## 2021-01-20 MED ORDER — SODIUM CHLORIDE 0.9 % IV BOLUS
500.0000 mL | Freq: Once | INTRAVENOUS | Status: AC
  Administered 2021-01-21: 500 mL via INTRAVENOUS

## 2021-01-20 NOTE — ED Provider Notes (Signed)
Emergency Department Provider Note   I have reviewed the triage vital signs and the nursing notes.   HISTORY  Chief Complaint Rectal Bleeding   HPI Anna Mccoy is a 87 y.o. female with past medical history reviewed below presents to the emergency department with bright red blood found on her pad today.  She has had intermittent bleeding in the past thought to be from hemorrhoids.  She does not follow regularly with a GI doctor.  She is not anticoagulated.  Patient has no complaint such as abdominal pain, urinary infection symptoms, weakness.  Family care for her and noted the blood.  They called out EMS earlier today but ultimately decided to follow with the primary care doctor.  According to the daughter at bedside they did a rectal exam today but found no clear source of bleeding.  Some additional blood was found in the pad this evening and they present for evaluation.  Patient has no complaints at this time.   Past Medical History:  Diagnosis Date   Anemia    Arthritis    Cerebral infarction due to unspecified mechanism    Cholesterol retinal embolus of both eyes 09/28/2015   Chronic diarrhea of unknown origin 12/04/2015   Chronic diastolic CHF (congestive heart failure) (HCC)    CVA (cerebral infarction) 02/02/2015   DDD (degenerative disc disease), lumbar    Edema    Essential tremor 09/29/2015   Gait disturbance 06/06/2017   Hx-TIA (transient ischemic attack)    Hypertension    Hypertensive heart disease with heart failure (HCC) 01/13/2016   Hypertrophic toenail 09/28/2015   Hypothyroidism    HYPOTHYROIDISM   Impacted cerumen of right ear 01/10/2018   Kyphosis (acquired) (postural) 10/07/2016   Menopausal state age 12   Mitral valve regurgitation    TRIVIAL   Osteoporosis 09/28/2015   Peripheral neuropathy 09/28/2015   Post-menopausal bleeding 02/2011   Endo Biopsy 7/12 benign, no hyperplasia   Procidentia of uterus    uses pessary   PVC's (premature ventricular  contractions)    RBBB    Rheumatoid arthritis(714.0)    Seizure cerebral (HCC)    Stroke (HCC) 1996   pt estimation   Sudden idiopathic hearing loss of right ear 01/18/2018   Syncope 11/10/2017   Vitamin D deficiency disease     Patient Active Problem List   Diagnosis Date Noted   Pure hypercholesterolemia 06/01/2019   Rectal bleeding 02/13/2018    Class: History of   Dysfunction of right eustachian tube 02/06/2018   Presbycusis of both ears 02/06/2018   Sudden idiopathic hearing loss of right ear 01/18/2018   Bilateral impacted cerumen 01/10/2018   Hospital discharge follow-up 12/02/2017   Other fatigue    Syncope 11/10/2017   Encounter for Medicare annual wellness exam 10/08/2017   Abnormal urinalysis 08/22/2017   Gait disturbance 06/06/2017   Frailty 10/07/2016   Kyphosis (acquired) (postural) 10/07/2016   Bilateral leg edema 01/13/2016   Hypertensive heart disease with heart failure (HCC) 01/13/2016   Chronic diastolic heart failure (HCC) 01/13/2016   Blurred vision, bilateral 12/31/2015   Bilateral dry eyes 12/31/2015   Chronic fatigue 12/15/2015   Chronic diarrhea of unknown origin 12/04/2015   Essential tremor 09/29/2015   Aphthous ulcer 09/28/2015   Cardiac arrhythmia 09/28/2015   Cataract 09/28/2015   Cholesterol retinal embolus of both eyes 09/28/2015   History of headache 09/28/2015   Hypertrophic toenail 09/28/2015   Hypokalemia 09/28/2015   Osteoporosis 09/28/2015   Peripheral neuropathy 09/28/2015  Seborrheic dermatitis 09/28/2015   Situational anxiety 09/28/2015   Vitamin D deficiency 09/28/2015   Weight loss 09/28/2015   History of stroke 09/28/2015   UTI (lower urinary tract infection)    Seizure cerebral (HCC)    CVA (cerebral infarction) 02/02/2015   History of Stroke in 2016 without residual deficits (HCC) 02/02/2015   Rheumatoid arthritis (HCC) 02/02/2015   Headache 02/02/2015   Hypertension 02/02/2015   Hypothyroidism 02/02/2015   Cerebral  infarction due to unspecified mechanism    Uterovaginal prolapse, complete 04/18/2013   Complete uterovaginal prolapse 04/18/2013    Past Surgical History:  Procedure Laterality Date   BACK SURGERY     CHOLECYSTECTOMY, LAPAROSCOPIC  2002   COLONOSCOPY W/ BIOPSIES  6/09   sigmoid diverticuli recheck 5 years   EYE SURGERY  04/24/14   Cataract sx in right eye with lens replacement.   LAMINECTOMY AND MICRODISCECTOMY LUMBAR SPINE  01/23/09   VAGINAL DELIVERY     x5    Allergies Ace inhibitors, Ciprofloxacin, Lidocaine, Neosporin [neomycin-bacitracin zn-polymyx], and Sulfa antibiotics  Family History  Problem Relation Age of Onset   Hypertension Mother    Stroke Mother    Hypertension Sister    Diabetes Sister    Hypertension Sister    Diabetes Sister     Social History Social History   Tobacco Use   Smoking status: Never   Smokeless tobacco: Never  Vaping Use   Vaping Use: Never used  Substance Use Topics   Alcohol use: No   Drug use: No    Review of Systems  Constitutional: No fever/chills Eyes: No visual changes. ENT: No sore throat. Cardiovascular: Denies chest pain. Respiratory: Denies shortness of breath. Gastrointestinal: No abdominal pain.  No nausea, no vomiting.  No diarrhea.  No constipation. Blood found on diaper pad.  Genitourinary: Negative for dysuria. Musculoskeletal: Negative for back pain. Skin: Negative for rash. Neurological: Negative for headaches, focal weakness or numbness.  10-point ROS otherwise negative.  ____________________________________________   PHYSICAL EXAM:  VITAL SIGNS: ED Triage Vitals  Enc Vitals Group     BP 01/20/21 2252 (!) 167/88     Pulse Rate 01/20/21 2252 65     Resp 01/20/21 2252 19     Temp 01/20/21 2252 98.7 F (37.1 C)     Temp Source 01/20/21 2252 Oral     SpO2 01/20/21 2252 99 %     Weight 01/20/21 2250 115 lb 1.3 oz (52.2 kg)     Height 01/20/21 2250  (1.499 m)   Constitutional: Alert and  oriented. Well appearing and in no acute distress. Eyes: Conjunctivae are normal.  Head: Atraumatic. Nose: No congestion/rhinnorhea. Mouth/Throat: Mucous membranes are moist.   Neck: No stridor.   Cardiovascular: Normal rate, regular rhythm. Good peripheral circulation. Grossly normal heart sounds.   Respiratory: Normal respiratory effort.  No retractions. Lungs CTAB. Gastrointestinal: Soft and nontender. No distention.  Musculoskeletal: No lower extremity tenderness nor edema. No gross deformities of extremities. Neurologic:  Normal speech and language. No gross focal neurologic deficits are appreciated.  Skin:  Skin is warm, dry and intact. No rash noted.   ____________________________________________   LABS (all labs ordered are listed, but only abnormal results are displayed)  Labs Reviewed  COMPREHENSIVE METABOLIC PANEL - Abnormal; Notable for the following components:      Result Value   Sodium 131 (*)    Chloride 96 (*)    BUN 27 (*)    All other components within normal limits  CBC WITH DIFFERENTIAL/PLATELET - Abnormal; Notable for the following components:   RBC 3.56 (*)    Hemoglobin 11.9 (*)    HCT 35.1 (*)    All other components within normal limits  URINALYSIS, ROUTINE W REFLEX MICROSCOPIC - Abnormal; Notable for the following components:   Color, Urine STRAW (*)    All other components within normal limits  URINE CULTURE  LIPASE, BLOOD  PROTIME-INR  TYPE AND SCREEN  ABO/RH   ____________________________________________  RADIOLOGY  CT ABDOMEN PELVIS WO CONTRAST  Result Date: 01/21/2021 CLINICAL DATA:  Hematochezia EXAM: CT ABDOMEN AND PELVIS WITHOUT CONTRAST TECHNIQUE: Multidetector CT imaging of the abdomen and pelvis was performed following the standard protocol without IV contrast. COMPARISON:  07/06/2010 FINDINGS: Lower chest: There is mild bibasilar bronchiectasis. Mild bibasilar subpleural fibrosis, asymmetrically more severe within the dependent left  lower lobe. Cardiac size within normal limits. Small hiatal hernia. Hepatobiliary: No focal liver abnormality is seen. Status post cholecystectomy. No biliary dilatation. Pancreas: Unremarkable Spleen: Unremarkable Adrenals/Urinary Tract: The adrenal glands are unremarkable. The kidneys are normal in size and position. There is fluid distension of the renal pelves bilaterally, similar to prior examination and compatible with extrarenal pelves. No hydronephrosis. No intrarenal or ureteral calculi. The bladder is markedly dilated and a large urachal diverticulum demonstrating broad communication with the dome of the bladder is identified, enlarged since prior examination. This diverticulum measures 7.5 x 7.4 x 6.5 cm in dimension within estimated volume of roughly 200 cc. Stomach/Bowel: Colon is redundant and there is severe pancolonic diverticulosis noted. No superimposed inflammatory changes are identified. The stomach, small bowel, and large bowel are otherwise unremarkable. Appendix normal. No free intraperitoneal gas or fluid. Vascular/Lymphatic: There is moderate aortoiliac atherosclerotic calcification. No aortic aneurysm. No pathologic adenopathy within the abdomen and pelvis. Reproductive: Uterus and bilateral adnexa are unremarkable. Other: No body wall hernia noted. Musculoskeletal: Degenerative changes are seen within the lumbar spine. No acute bone abnormality. No lytic or blastic bone lesions are seen. IMPRESSION: No acute intra-abdominal pathology identified. No definite radiographic explanation for the patient's hematochezia. Severe pancolonic diverticulosis without superimposed inflammatory change. Marked bladder distension with large urachal diverticulum noted. This diverticulum has enlarged in size since prior examination and may contribute to incomplete voiding and bladder dysfunction. No associated mass identified. Aortic Atherosclerosis (ICD10-I70.0). Electronically Signed   By: Helyn Numbers MD    On: 01/21/2021 02:44    ____________________________________________   PROCEDURES  Procedure(s) performed:   Procedures   ____________________________________________   INITIAL IMPRESSION / ASSESSMENT AND PLAN / ED COURSE  Pertinent labs & imaging results that were available during my care of the patient were reviewed by me and considered in my medical decision making (see chart for details).   Patient presents to the emergency department with blood found in the diaper pad today.  Patient has a picture and this is bright red blood.  Seems relatively small volume.  Plan for CT abdomen pelvis to rule out diverticulitis.  Will obtain screening blood work along with UA in case this is more hematuria.   UA negative for hematuria. CT without acute findings. Labs reviewed. Plan for close PCP and GI follow up. Plan to treat hemorrhoids.  ____________________________________________  FINAL CLINICAL IMPRESSION(S) / ED DIAGNOSES  Final diagnoses:  Rectal bleeding     MEDICATIONS GIVEN DURING THIS VISIT:  Medications  sodium chloride 0.9 % bolus 500 mL (0 mLs Intravenous Stopped 01/21/21 0029)     NEW OUTPATIENT MEDICATIONS STARTED DURING THIS VISIT:  Discharge Medication List as of 01/21/2021  5:47 AM     START taking these medications   Details  hydrocortisone (ANUSOL-HC) 25 MG suppository Place 1 suppository (25 mg total) rectally 2 (two) times daily as needed for hemorrhoids or anal itching., Starting Fri 01/21/2021, Normal        Note:  This document was prepared using Dragon voice recognition software and may include unintentional dictation errors.  Alona Bene, MD, [facility]    Quinton Voth, Arlyss Repress, MD 01/21/21 2352

## 2021-01-20 NOTE — ED Triage Notes (Signed)
Patient BIB [facility] from home. Patient said yesterday she had blood in her bowel movements, bright red blood. Patient went to doctor today, they said if it keeps happening to call 911 so that is what she did. Patient said it has happened before. Alert and oriented x4.

## 2021-01-21 ENCOUNTER — Emergency Department (HOSPITAL_COMMUNITY): Payer: Medicare Other

## 2021-01-21 DIAGNOSIS — K625 Hemorrhage of anus and rectum: Secondary | ICD-10-CM | POA: Diagnosis not present

## 2021-01-21 LAB — CBC WITH DIFFERENTIAL/PLATELET
Abs Immature Granulocytes: 0.05 10*3/uL (ref 0.00–0.07)
Basophils Absolute: 0 10*3/uL (ref 0.0–0.1)
Basophils Relative: 0 %
Eosinophils Absolute: 0.3 10*3/uL (ref 0.0–0.5)
Eosinophils Relative: 4 %
HCT: 35.1 % — ABNORMAL LOW (ref 36.0–46.0)
Hemoglobin: 11.9 g/dL — ABNORMAL LOW (ref 12.0–15.0)
Immature Granulocytes: 1 %
Lymphocytes Relative: 47 %
Lymphs Abs: 3.9 10*3/uL (ref 0.7–4.0)
MCH: 33.4 pg (ref 26.0–34.0)
MCHC: 33.9 g/dL (ref 30.0–36.0)
MCV: 98.6 fL (ref 80.0–100.0)
Monocytes Absolute: 0.9 10*3/uL (ref 0.1–1.0)
Monocytes Relative: 11 %
Neutro Abs: 3 10*3/uL (ref 1.7–7.7)
Neutrophils Relative %: 37 %
Platelets: 199 10*3/uL (ref 150–400)
RBC: 3.56 MIL/uL — ABNORMAL LOW (ref 3.87–5.11)
RDW: 14.8 % (ref 11.5–15.5)
WBC: 8.1 10*3/uL (ref 4.0–10.5)
nRBC: 0 % (ref 0.0–0.2)

## 2021-01-21 LAB — COMPREHENSIVE METABOLIC PANEL
ALT: 31 U/L (ref 0–44)
AST: 29 U/L (ref 15–41)
Albumin: 3.6 g/dL (ref 3.5–5.0)
Alkaline Phosphatase: 61 U/L (ref 38–126)
Anion gap: 9 (ref 5–15)
BUN: 27 mg/dL — ABNORMAL HIGH (ref 8–23)
CO2: 26 mmol/L (ref 22–32)
Calcium: 8.9 mg/dL (ref 8.9–10.3)
Chloride: 96 mmol/L — ABNORMAL LOW (ref 98–111)
Creatinine, Ser: 0.85 mg/dL (ref 0.44–1.00)
GFR, Estimated: >60 mL/min (ref >60)
Glucose, Bld: 78 mg/dL (ref 70–99)
Potassium: 4.4 mmol/L (ref 3.5–5.1)
Sodium: 131 mmol/L — ABNORMAL LOW (ref 135–145)
Total Bilirubin: 0.3 mg/dL (ref 0.3–1.2)
Total Protein: 7 g/dL (ref 6.5–8.1)

## 2021-01-21 LAB — URINALYSIS, ROUTINE W REFLEX MICROSCOPIC
Bilirubin Urine: NEGATIVE
Glucose, UA: NEGATIVE mg/dL
Hgb urine dipstick: NEGATIVE
Ketones, ur: NEGATIVE mg/dL
Leukocytes,Ua: NEGATIVE
Nitrite: NEGATIVE
Protein, ur: NEGATIVE mg/dL
Specific Gravity, Urine: 1.008 (ref 1.005–1.030)
pH: 7 (ref 5.0–8.0)

## 2021-01-21 LAB — TYPE AND SCREEN
ABO/RH(D): A POS
Antibody Screen: NEGATIVE

## 2021-01-21 LAB — PROTIME-INR
INR: 1.1 (ref 0.8–1.2)
Prothrombin Time: 13.7 seconds (ref 11.4–15.2)

## 2021-01-21 LAB — LIPASE, BLOOD: Lipase: 50 U/L (ref 11–51)

## 2021-01-21 MED ORDER — HYDROCORTISONE ACETATE 25 MG RE SUPP: 25.0000 mg | Freq: Two times a day (BID) | RECTAL | 0 refills | Status: DC | PRN

## 2021-01-21 NOTE — ED Notes (Signed)
RN asked patient to provide urine sample. Patient unable to at this time.  

## 2021-01-21 NOTE — Discharge Instructions (Addendum)
You were seen in the emergency room today with intermittent bleeding from the rectum.  I am starting you on steroid suppositories for hemorrhoids.  I would like for you to follow with the gastroenterologist you have seen in the past, Dr. Loreta Ave.  I have listed her phone number.  Please call today to make an appointment.  Please follow closely with your primary care doctor.  Return to the emergency department for new or suddenly worsening symptoms.

## 2021-01-22 LAB — URINE CULTURE: Culture: NO GROWTH

## 2021-03-26 DIAGNOSIS — K5730 Diverticulosis of large intestine without perforation or abscess without bleeding: Secondary | ICD-10-CM | POA: Insufficient documentation

## 2021-03-28 ENCOUNTER — Other Ambulatory Visit: Payer: Self-pay

## 2021-03-28 ENCOUNTER — Encounter (HOSPITAL_COMMUNITY): Payer: Self-pay

## 2021-03-28 ENCOUNTER — Emergency Department (HOSPITAL_COMMUNITY)
Admission: EM | Admit: 2021-03-28 | Discharge: 2021-03-29 | Disposition: A | Discharge status: Home / Self Care | Payer: Medicare Other | Attending: Emergency Medicine | Admitting: Emergency Medicine

## 2021-03-28 DIAGNOSIS — Z20822 Contact with and (suspected) exposure to covid-19: Secondary | ICD-10-CM | POA: Diagnosis not present

## 2021-03-28 DIAGNOSIS — E039 Hypothyroidism, unspecified: Secondary | ICD-10-CM | POA: Diagnosis not present

## 2021-03-28 DIAGNOSIS — I110 Hypertensive heart disease with heart failure: Secondary | ICD-10-CM | POA: Diagnosis not present

## 2021-03-28 DIAGNOSIS — N3000 Acute cystitis without hematuria: Secondary | ICD-10-CM | POA: Insufficient documentation

## 2021-03-28 DIAGNOSIS — Z79899 Other long term (current) drug therapy: Secondary | ICD-10-CM | POA: Diagnosis not present

## 2021-03-28 DIAGNOSIS — R339 Retention of urine, unspecified: Secondary | ICD-10-CM

## 2021-03-28 DIAGNOSIS — I5032 Chronic diastolic (congestive) heart failure: Secondary | ICD-10-CM | POA: Insufficient documentation

## 2021-03-28 DIAGNOSIS — Z7982 Long term (current) use of aspirin: Secondary | ICD-10-CM | POA: Diagnosis not present

## 2021-03-28 LAB — COMPREHENSIVE METABOLIC PANEL
ALT: 24 U/L (ref 0–44)
AST: 26 U/L (ref 15–41)
Albumin: 3.6 g/dL (ref 3.5–5.0)
Alkaline Phosphatase: 65 U/L (ref 38–126)
Anion gap: 9 (ref 5–15)
BUN: 27 mg/dL — ABNORMAL HIGH (ref 8–23)
CO2: 25 mmol/L (ref 22–32)
Calcium: 9.5 mg/dL (ref 8.9–10.3)
Chloride: 97 mmol/L — ABNORMAL LOW (ref 98–111)
Creatinine, Ser: 0.95 mg/dL (ref 0.44–1.00)
GFR, Estimated: 56 mL/min — ABNORMAL LOW (ref >60)
Glucose, Bld: 163 mg/dL — ABNORMAL HIGH (ref 70–99)
Potassium: 4.8 mmol/L (ref 3.5–5.1)
Sodium: 131 mmol/L — ABNORMAL LOW (ref 135–145)
Total Bilirubin: 0.3 mg/dL (ref 0.3–1.2)
Total Protein: 7.2 g/dL (ref 6.5–8.1)

## 2021-03-28 LAB — CBC WITH DIFFERENTIAL/PLATELET
Abs Immature Granulocytes: 0.06 10*3/uL (ref 0.00–0.07)
Basophils Absolute: 0 10*3/uL (ref 0.0–0.1)
Basophils Relative: 0 %
Eosinophils Absolute: 0 10*3/uL (ref 0.0–0.5)
Eosinophils Relative: 0 %
HCT: 38.5 % (ref 36.0–46.0)
Hemoglobin: 13 g/dL (ref 12.0–15.0)
Immature Granulocytes: 1 %
Lymphocytes Relative: 15 %
Lymphs Abs: 1.6 10*3/uL (ref 0.7–4.0)
MCH: 32.8 pg (ref 26.0–34.0)
MCHC: 33.8 g/dL (ref 30.0–36.0)
MCV: 97.2 fL (ref 80.0–100.0)
Monocytes Absolute: 1.6 10*3/uL — ABNORMAL HIGH (ref 0.1–1.0)
Monocytes Relative: 14 %
Neutro Abs: 7.5 10*3/uL (ref 1.7–7.7)
Neutrophils Relative %: 70 %
Platelets: 228 10*3/uL (ref 150–400)
RBC: 3.96 MIL/uL (ref 3.87–5.11)
RDW: 13.3 % (ref 11.5–15.5)
WBC: 10.8 10*3/uL — ABNORMAL HIGH (ref 4.0–10.5)
nRBC: 0 % (ref 0.0–0.2)

## 2021-03-28 LAB — TSH: TSH: 8.125 u[IU]/mL — ABNORMAL HIGH (ref 0.350–4.500)

## 2021-03-28 NOTE — ED Provider Notes (Signed)
[facility]-EMERGENCY DEPT Provider Note   CSN: [id number] Arrival date & time: 03/28/21  2209     History Chief Complaint  Patient presents with   Fatigue    Anna Mccoy is a 87 y.o. female with a PMHx significant for hypothyroidism, urinary retention, prior CVA, arrhythmias, diastolic heart failure, and hypertension who presents with a one day history of generalized weakness, shakiness, and without urination since yesterday evening. The patient reports she has had issues with urinary retention before, and has taken Bethanechol prn in the past but has not taken it recently. She reports the sensation of needing to urinate, and denies recent dysuria, UTI. Patient denies stomach pain, discomfort, recent illness or sick contacts. At time of interview the patient does not report he weakness or shakiness that she initially came in with, but reports her daughter was worried about her and wanted her to be evaluated.  Patient's daughter present at bedside after a few hours and reports patient does take 5mg  Bethanechol BID.  HPI     Past Medical History:  Diagnosis Date   Anemia    Arthritis    Cerebral infarction due to unspecified mechanism    Cholesterol retinal embolus of both eyes 09/28/2015   Chronic diarrhea of unknown origin 12/04/2015   Chronic diastolic CHF (congestive heart failure) (HCC)    CVA (cerebral infarction) 02/02/2015   DDD (degenerative disc disease), lumbar    Edema    Essential tremor 09/29/2015   Gait disturbance 06/06/2017   Hx-TIA (transient ischemic attack)    Hypertension    Hypertensive heart disease with heart failure (HCC) 01/13/2016   Hypertrophic toenail 09/28/2015   Hypothyroidism    HYPOTHYROIDISM   Impacted cerumen of right ear 01/10/2018   Kyphosis (acquired) (postural) 10/07/2016   Menopausal state age 7   Mitral valve regurgitation    TRIVIAL   Osteoporosis 09/28/2015   Peripheral neuropathy 09/28/2015   Post-menopausal  bleeding 02/2011   Endo Biopsy 7/12 benign, no hyperplasia   Procidentia of uterus    uses pessary   PVC's (premature ventricular contractions)    RBBB    Rheumatoid arthritis(714.0)    Seizure cerebral (HCC)    Stroke (HCC) 1996   pt estimation   Sudden idiopathic hearing loss of right ear 01/18/2018   Syncope 11/10/2017   Vitamin D deficiency disease     Patient Active Problem List   Diagnosis Date Noted   Pure hypercholesterolemia 06/01/2019   Rectal bleeding 02/13/2018    Class: History of   Dysfunction of right eustachian tube 02/06/2018   Presbycusis of both ears 02/06/2018   Sudden idiopathic hearing loss of right ear 01/18/2018   Bilateral impacted cerumen 01/10/2018   Hospital discharge follow-up 12/02/2017   Other fatigue    Syncope 11/10/2017   Encounter for Medicare annual wellness exam 10/08/2017   Abnormal urinalysis 08/22/2017   Gait disturbance 06/06/2017   Frailty 10/07/2016   Kyphosis (acquired) (postural) 10/07/2016   Bilateral leg edema 01/13/2016   Hypertensive heart disease with heart failure (HCC) 01/13/2016   Chronic diastolic heart failure (HCC) 01/13/2016   Blurred vision, bilateral 12/31/2015   Bilateral dry eyes 12/31/2015   Chronic fatigue 12/15/2015   Chronic diarrhea of unknown origin 12/04/2015   Essential tremor 09/29/2015   Aphthous ulcer 09/28/2015   Cardiac arrhythmia 09/28/2015   Cataract 09/28/2015   Cholesterol retinal embolus of both eyes 09/28/2015   History of headache 09/28/2015   Hypertrophic toenail 09/28/2015   Hypokalemia  09/28/2015   Osteoporosis 09/28/2015   Peripheral neuropathy 09/28/2015   Seborrheic dermatitis 09/28/2015   Situational anxiety 09/28/2015   Vitamin D deficiency 09/28/2015   Weight loss 09/28/2015   History of stroke 09/28/2015   UTI (lower urinary tract infection)    Seizure cerebral (HCC)    CVA (cerebral infarction) 02/02/2015   History of Stroke in 2016 without residual deficits (HCC)  02/02/2015   Rheumatoid arthritis (HCC) 02/02/2015   Headache 02/02/2015   Hypertension 02/02/2015   Hypothyroidism 02/02/2015   Cerebral infarction due to unspecified mechanism    Uterovaginal prolapse, complete 04/18/2013   Complete uterovaginal prolapse 04/18/2013    Past Surgical History:  Procedure Laterality Date   BACK SURGERY     CHOLECYSTECTOMY, LAPAROSCOPIC  2002   COLONOSCOPY W/ BIOPSIES  6/09   sigmoid diverticuli recheck 5 years   EYE SURGERY  04/24/14   Cataract sx in right eye with lens replacement.   LAMINECTOMY AND MICRODISCECTOMY LUMBAR SPINE  01/23/09   VAGINAL DELIVERY     x5     OB History     Gravida  5   Para  5   Term  5   Preterm  0   AB  0   Living  5      SAB  0   IAB  0   Ectopic  0   Multiple  0   Live Births  5           Family History  Problem Relation Age of Onset   Hypertension Mother    Stroke Mother    Hypertension Sister    Diabetes Sister    Hypertension Sister    Diabetes Sister     Social History   Tobacco Use   Smoking status: Never   Smokeless tobacco: Never  Vaping Use   Vaping Use: Never used  Substance Use Topics   Alcohol use: No   Drug use: No    Home Medications Prior to Admission medications   Medication Sig Start Date End Date Taking? Authorizing Provider  cephALEXin (KEFLEX) 500 MG capsule Take 1 capsule (500 mg total) by mouth 4 (four) times daily. 03/29/21  Yes Millisa Giarrusso H, PA-C  acetaminophen (TYLENOL) 500 MG tablet Take 500 mg by mouth every 6 (six) hours as needed for moderate pain.    [provider]  amLODipine (NORVASC) 5 MG tablet Take 5 mg by mouth daily.    [provider]  aspirin 81 MG tablet Take 81 mg by mouth daily.     [provider]  atorvastatin (LIPITOR) 20 MG tablet Take 20 mg by mouth every evening.     [provider]  bethanechol (URECHOLINE) 10 MG tablet Take 1 tablet (10 mg total) by mouth 2 (two) times daily.  05/21/20   Marcine Matar, MD  carvedilol (COREG) 12.5 MG tablet TAKE 1 TABLET BY MOUTH TWICE DAILY Patient taking differently: Take 12.5 mg by mouth daily.  03/03/19   Dyann Kief, PA-C  divalproex (DEPAKOTE) 250 MG DR tablet Take 1 tablet (250 mg total) by mouth every 12 (twelve) hours. 02/05/15   Rhetta Mura, MD  Docusate Sodium (COLACE PO) Take 1 tablet by mouth 2 (two) times daily.    [provider]  fluticasone (FLONASE) 50 MCG/ACT nasal spray Place 1 spray into both nostrils daily.  01/14/18   [provider]  folic acid (FOLVITE) 1 MG tablet Take 1 mg by mouth daily.  [provider]  furosemide (LASIX) 20 MG tablet Take 20 mg by mouth daily.  10/20/16   [provider]  hydrocortisone (ANUSOL-HC) 25 MG suppository Place 1 suppository (25 mg total) rectally 2 (two) times daily as needed for hemorrhoids or anal itching. 01/21/21   Long, Arlyss Repress, MD  hypromellose (GENTEAL SEVERE) 0.3 % GEL ophthalmic ointment Place 1 application into both eyes at bedtime.    [provider]  leflunomide (ARAVA) 20 MG tablet Take 20 mg by mouth as directed. Patient not taking: Reported on 05/30/2020 03/03/20   [provider]  levothyroxine (SYNTHROID) 88 MCG tablet Take 88 mcg by mouth daily before breakfast.  12/06/19   [provider]  Multiple Vitamins-Minerals (PRESERVISION AREDS PO) Take 1 capsule by mouth daily.    [provider]  nitrofurantoin (MACRODANTIN) 100 MG capsule Take 100 mg by mouth 2 (two) times daily. Start date : 05/28/20    [provider]  olmesartan-hydrochlorothiazide (BENICAR HCT) 40-25 MG tablet Take 1 tablet by mouth daily. 11/05/17   [provider]  POLY-IRON 150 150 MG capsule Take 150 mg by mouth daily.  10/13/15   [provider]  potassium chloride SA (K-DUR,KLOR-CON) 20 MEQ tablet Take 20 mEq by mouth 2 (two) times daily.    [provider]  primidone  (MYSOLINE) 50 MG tablet Take 1 tablet by mouth daily.  09/20/16   [provider]  REFRESH OPTIVE ADVANCED 0.5-1-0.5 % SOLN Apply 1 drop to eye in the morning, at noon, and at bedtime.  12/23/19   [provider]  triamcinolone cream (KENALOG) 0.1 % Apply 1 application topically 2 (two) times daily as needed (itching).  02/07/20   [provider]  UNABLE TO FIND Take 1 capsule by mouth daily. Ultra flora     [provider]    Allergies    Ace inhibitors, Ciprofloxacin, Lidocaine, Neosporin [neomycin-bacitracin zn-polymyx], and Sulfa antibiotics  Review of Systems   Review of Systems  Constitutional:  Positive for fatigue. Negative for chills, diaphoresis and fever.  Eyes:  Negative for visual disturbance.  Respiratory:  Negative for chest tightness and shortness of breath.   Cardiovascular:  Negative for chest pain and palpitations.  Gastrointestinal:  Negative for abdominal pain and constipation.  Endocrine: Positive for cold intolerance.  Genitourinary:  Positive for decreased urine volume and difficulty urinating.  Musculoskeletal:  Negative for back pain and neck pain.  Skin:  Negative for color change.  Neurological:  Negative for dizziness, tremors, light-headedness and numbness.   Physical Exam Updated Vital Signs BP 135/73 (BP Location: Left Arm)   Pulse 72   Temp 98.2 F (36.8 C) (Oral)   Resp 17   Ht 4\' 11"  (1.499 m)   Wt 52.2 kg   LMP 08/14/1981 (Approximate)   SpO2 97%   BMI 23.24 kg/m   Physical Exam Vitals and nursing note reviewed.  Constitutional:      General: She is not in acute distress.    Comments: Patient has mittens on hands, knit cap on head, and is covered in multiple blankets  HENT:     Head: Normocephalic and atraumatic.     Nose: No congestion or rhinorrhea.  Eyes:     General:        Right eye: No discharge.        Left eye: No discharge.  Cardiovascular:     Rate and Rhythm: Normal rate and regular rhythm.      Heart  sounds: No murmur heard.   No friction rub. No gallop.  Pulmonary:     Effort: Pulmonary effort is normal.     Breath sounds: Normal breath sounds.  Abdominal:     General: Bowel sounds are normal.     Palpations: Abdomen is soft.  Musculoskeletal:     Right lower leg: No edema.     Left lower leg: No edema.     Comments: Difficult to assess extent of strength without knowledge of baseline in patient, but good effort with no asymmetry in all four extremities.  Skin:    General: Skin is warm and dry.     Capillary Refill: Capillary refill takes less than 2 seconds.  Neurological:     Mental Status: She is alert and oriented to person, place, and time.     Cranial Nerves: No cranial nerve deficit.     Sensory: No sensory deficit.  Psychiatric:        Mood and Affect: Mood normal.        Behavior: Behavior normal.    ED Results / Procedures / Treatments   Labs (all labs ordered are listed, but only abnormal results are displayed) Labs Reviewed  CBC WITH DIFFERENTIAL/PLATELET - Abnormal; Notable for the following components:      Result Value   WBC 10.8 (*)    Monocytes Absolute 1.6 (*)    All other components within normal limits  COMPREHENSIVE METABOLIC PANEL - Abnormal; Notable for the following components:   Sodium 131 (*)    Chloride 97 (*)    Glucose, Bld 163 (*)    BUN 27 (*)    GFR, Estimated 56 (*)    All other components within normal limits  TSH - Abnormal; Notable for the following components:   TSH 8.125 (*)    All other components within normal limits  URINALYSIS, ROUTINE W REFLEX MICROSCOPIC - Abnormal; Notable for the following components:   APPearance TURBID (*)    Hgb urine dipstick SMALL (*)    Protein, ur 100 (*)    Leukocytes,Ua LARGE (*)    WBC, UA >50 (*)    Bacteria, UA RARE (*)    All other components within normal limits  RESP PANEL BY RT-PCR (FLU A&B, COVID) [id number]    EKG EKG Interpretation  Date/Time:  Monday March 28 2021  23:24:37 EDT Ventricular Rate:  82 PR Interval:  198 QRS Duration: 118 QT Interval:  392 QTC Calculation: 457 R Axis:   -72 Text Interpretation: Normal sinus rhythm Left axis deviation Low voltage QRS Incomplete right bundle branch block Inferior infarct , age undetermined Possible Anterolateral infarct , age undetermined Abnormal ECG No significant change since last tracing Confirmed by Linwood Dibbles [phone]) on 03/28/2021 11:41:53 PM  Radiology No results found.  Procedures Procedures   Medications Ordered in ED Medications  cefTRIAXone (ROCEPHIN) 1 g in sodium chloride 0.9 % 100 mL IVPB (0 g Intravenous Stopped 03/29/21 0243)    ED Course  I have reviewed the triage vital signs and the nursing notes.  Pertinent labs & imaging results that were available during my care of the patient were reviewed by me and considered in my medical decision making (see chart for details).    MDM Rules/Calculators/A&P                         Urinary retention evaluated with bladder scan which showed 446 ml Urine. Patient with relief after foley catheterization.  Due to hx of urinary retention, chose to send patient with foley to prevent acute urinary retention until patient is re-evaluated by urology.  UA showed greater white count, leukocytes, bacterial burden than in recent previous UA. Along with mildly elevated WBC @ 10.8, favor UTI. Patient afebrile, without suprapubic pain, dysuria, CVA. Patient at baseline for mental status per daughter. Given IV dose of Ceftriaxone here, and rx for Keflex for home.  Incidentally elevated TSH noted. Hx of hypothyroidism, appears inadequately medicated at this time. Fits with clinical picture of cold sensitivity, recent lack of energy. No goiter, thyroid bruit noted. No edema throughout body. Patient to follow up with PCP.  Extensive return precautions and plan of care discussed with patient and daughter and all questions and concerns were answered. Final Clinical  Impression(s) / ED Diagnoses Final diagnoses:  Urinary retention  Acute cystitis without hematuria  Hypothyroidism, unspecified type    Rx / DC Orders ED Discharge Orders          Ordered    cephALEXin (KEFLEX) 500 MG capsule  4 times daily        03/29/21 0122             Olene Floss, PA-C 03/29/21 0245    Marily Memos, MD 03/29/21 [phone]

## 2021-03-28 NOTE — ED Notes (Signed)
Bladder scan >446.

## 2021-03-28 NOTE — ED Triage Notes (Signed)
Patient BIB [facility] from home. Complaining of generalized weakness and shakiness that started this afternoon. Patient denies painful urination. No shortness of breath. History of stroke but no deficits.  EMS vitals 135/81 92 pulse 14 RR 98% Room Air 198 CBG 21F

## 2021-03-28 NOTE — ED Provider Notes (Signed)
Pt seen in conjunction with C Prosperi, PA-C. Please see his notes for full history, exam, and plan.   In brief, patient presenting for evaluation of generalized weakness and urinary retention.  History of urinary retention, is on as needed medication, but has not been taking it recently.  Reports last urination was yesterday.  She also reports generalized weakness that occurred earlier today, but states she is no longer feeling weak or tired.  Family members wanted her evaluated.  Patient lives at home with family.  On exam, patient is alert and oriented, appears nontoxic.  Purulent urine output noted from Foley catheter.  Urine consistent with infection, will treat.  Labs otherwise reassuring outside of elevated TSH.  Will have patient follow-up with PCP regarding this.     Alveria Apley, PA-C 03/29/21 0207    Marily Memos, MD 03/29/21 [phone]

## 2021-03-29 DIAGNOSIS — N3000 Acute cystitis without hematuria: Secondary | ICD-10-CM | POA: Diagnosis not present

## 2021-03-29 LAB — URINALYSIS, ROUTINE W REFLEX MICROSCOPIC
Bilirubin Urine: NEGATIVE
Glucose, UA: NEGATIVE mg/dL
Ketones, ur: NEGATIVE mg/dL
Nitrite: NEGATIVE
Protein, ur: 100 mg/dL — AB
Specific Gravity, Urine: 1.008 (ref 1.005–1.030)
WBC, UA: >50 WBC/hpf — ABNORMAL HIGH (ref 0–5)
pH: 6 (ref 5.0–8.0)

## 2021-03-29 LAB — RESP PANEL BY RT-PCR (FLU A&B, COVID) ARPGX2
Influenza A by PCR: NEGATIVE
Influenza B by PCR: NEGATIVE
SARS Coronavirus 2 by RT PCR: NEGATIVE

## 2021-03-29 MED ORDER — SODIUM CHLORIDE 0.9 % IV SOLN
1.0000 g | Freq: Once | INTRAVENOUS | Status: AC
  Administered 2021-03-29: 1 g via INTRAVENOUS
  Filled 2021-03-29: qty 10

## 2021-03-29 MED ORDER — CEPHALEXIN 500 MG PO CAPS: 500.0000 mg | ORAL_CAPSULE | Freq: Four times a day (QID) | ORAL | 0 refills | Status: DC

## 2021-03-29 NOTE — ED Notes (Addendum)
Patient and patients daughter educated on catheter care, follow up care with urology, how to maintain sterility with catheter, by cleaning tubing 2x a day, keeping it below the bladder and not on the floor. Education provided by Sheria Lang, RN and Rayfield Citizen, Charity fundraiser. Patients daughter performed teach back with the skills on emptying the foley bag and cleaning with alcohol wipes before and after draining the bag.

## 2021-03-29 NOTE — Discharge Instructions (Addendum)
As we discussed, your thyroid stimulating hormone suggests you have become somewhat hypothyroid, I would follow up with your PCP to discuss a dose adjustment. In addition, you were having some urinary retention. In order to prevent you having to come back to the hospital, we placed a foley catheter. Please follow up with your urologist to discuss your urinary retention as soon as you are able. We also noted some bacteria in your urine and are going to treat you for a UTI. I sent a prescription to the pharmacy on file. Return to the hospital if you notice new fever, pain in your back or sides, or develop a problem with your foley catheter before you see the urologist.

## 2021-04-04 NOTE — Progress Notes (Signed)
History of Present Illness:   5.25.2021: referred for issues with incomplete bladder emptying. She states that she will now have a strong urge to urinate but find herself unable to void any significant amount of urine even when straining. Of note, she has a hx of bladder prolapse that was managed with pessary but she is no longer using this (she cannot recall why she is no longer using this). She also has a hx of urge incontinence for which she is wearing pads (1 QD). She has limited mobility but is able to navigate her house and make it to the restroom without much issue with assistance of her walker. She has UUI. She has around 2-3 UTI's per year -- these are never symptomatic and are instead incidentally dx'd per UA. Her main concern today is this recent change in her ability to empty her bladder. On average, she finds her sx's quite bothersome (rating them an 8/10). Placed on bethanechol.  8.31.2021: Pt denies any recent UTIs or gross hematuria and reports an adequate FOS. Pt notes that bethanechol has alleviated her bladder retention. Pt must wear pads for urinary incontinence. Pt is pleased with her symptoms and submits no questions or concerns at this time.  8.23.2022: Recent visit to the emergency room on the 15th of this month for probable UTI.  Urine looked infected but culture was not performed.  Catheter was placed, her daughter thinks that 400 cc of urine was obtained upon catheterization.  She did have a CT scan for hematochezia back in June of this year.  Results as follows:  No acute intra-abdominal pathology identified. No definite radiographic explanation for the patient's hematochezia.  Severe pancolonic diverticulosis without superimposed inflammatory change.  Marked bladder distension with large urachal diverticulum noted. This diverticulum has enlarged in size since prior examination and may contribute to incomplete voiding and bladder dysfunction. No associated mass  identified..  Aortic Atherosclerosis (ICD10-I70.0).    Past Medical History:  Diagnosis Date   Anemia    Arthritis    Cerebral infarction due to unspecified mechanism    Cholesterol retinal embolus of both eyes 09/28/2015   Chronic diarrhea of unknown origin 12/04/2015   Chronic diastolic CHF (congestive heart failure) (HCC)    CVA (cerebral infarction) 02/02/2015   DDD (degenerative disc disease), lumbar    Edema    Essential tremor 09/29/2015   Gait disturbance 06/06/2017   Hx-TIA (transient ischemic attack)    Hypertension    Hypertensive heart disease with heart failure (HCC) 01/13/2016   Hypertrophic toenail 09/28/2015   Hypothyroidism    HYPOTHYROIDISM   Impacted cerumen of right ear 01/10/2018   Kyphosis (acquired) (postural) 10/07/2016   Menopausal state age 44   Mitral valve regurgitation    TRIVIAL   Osteoporosis 09/28/2015   Peripheral neuropathy 09/28/2015   Post-menopausal bleeding 02/2011   Endo Biopsy 7/12 benign, no hyperplasia   Procidentia of uterus    uses pessary   PVC's (premature ventricular contractions)    RBBB    Rheumatoid arthritis(714.0)    Seizure cerebral (HCC)    Stroke (HCC) 1996   pt estimation   Sudden idiopathic hearing loss of right ear 01/18/2018   Syncope 11/10/2017   Vitamin D deficiency disease     Past Surgical History:  Procedure Laterality Date   BACK SURGERY     CHOLECYSTECTOMY, LAPAROSCOPIC  2002   COLONOSCOPY W/ BIOPSIES  6/09   sigmoid diverticuli recheck 5 years   EYE SURGERY  04/24/14  Cataract sx in right eye with lens replacement.   LAMINECTOMY AND MICRODISCECTOMY LUMBAR SPINE  01/23/09   VAGINAL DELIVERY     x5    Home Medications:  (Not in a hospital admission)   Allergies:  Allergies  Allergen Reactions   Ace Inhibitors Cough   Ciprofloxacin Diarrhea   Lidocaine Other (See Comments)    unknown   Neosporin [Neomycin-Bacitracin Zn-Polymyx] Other (See Comments)    unknown   Sulfa Antibiotics Other (See  Comments)    unknown    Family History  Problem Relation Age of Onset   Hypertension Mother    Stroke Mother    Hypertension Sister    Diabetes Sister    Hypertension Sister    Diabetes Sister     Social History:  reports that she has never smoked. She has never used smokeless tobacco. She reports that she does not drink alcohol and does not use drugs.  ROS: A complete review of systems was performed.  All systems are negative except for pertinent findings as noted.  Physical Exam:  Vital signs in last 24 hours: @VSRANGES @ General:  Alert and oriented, No acute distress HEENT: Normocephalic, atraumatic Neck: No JVD or lymphadenopathy Cardiovascular: Regular rate  Lungs: Normal inspiratory/expiratory excursion Abdomen: Soft, nontender, nondistended, no abdominal masses Back: No CVA tenderness Extremities: No edema Neurologic: Grossly intact  I have reviewed prior pt notes  I have reviewed notes from recent ED visit  I have reviewed urinalysis results--no culture results available  I have independently reviewed prior imaging   Impression/Assessment:  1.  Incomplete bladder emptying.  She did have a large residual on CT scan back in June of this year with what they consider a urachal diverticulum.  She has had not had any real issues with her bladder since August of last year until last week with possible UTI  2.  Deconditioning, could be adding to the problem above  Plan:  1.  For today, I will leave the catheter out.  She does have a large capacity bladder so she will need more time to fill this up  2.  Increase bethanechol to 10 mg twice daily  3.  I will have her come back in a couple of days for nurse visit to assess voiding/check post void ultrasound.  4.  I will also gave her 3 days of Macrobid  5.  At her nurse visit, if she is doing okay, I would like them to schedule her to come back to see me in about 6 weeks.  September Anna Mccoy 04/04/2021, 1:14 PM   04/06/2021. Anna Troxler MD

## 2021-04-05 ENCOUNTER — Ambulatory Visit (INDEPENDENT_AMBULATORY_CARE_PROVIDER_SITE_OTHER): Payer: Medicare Other | Admitting: Urology

## 2021-04-05 ENCOUNTER — Other Ambulatory Visit: Payer: Self-pay

## 2021-04-05 ENCOUNTER — Encounter: Payer: Self-pay | Admitting: Urology

## 2021-04-05 VITALS — BP 143/71 | HR 60 | Wt 110.0 lb

## 2021-04-05 DIAGNOSIS — R339 Retention of urine, unspecified: Secondary | ICD-10-CM

## 2021-04-05 DIAGNOSIS — N3000 Acute cystitis without hematuria: Secondary | ICD-10-CM | POA: Diagnosis not present

## 2021-04-05 MED ORDER — NITROFURANTOIN MONOHYD MACRO 100 MG PO CAPS: 100.0000 mg | ORAL_CAPSULE | Freq: Two times a day (BID) | ORAL | 0 refills | Status: AC

## 2021-04-05 NOTE — Progress Notes (Signed)
Urological Symptom Review  Patient is experiencing the following symptoms: Hard to postpone urination Trouble starting stream Have to strain to urinate Urinary tract infection   Review of Systems  Gastrointestinal (upper)  : Negative for upper GI symptoms  Gastrointestinal (lower) : Negative for lower GI symptoms  Constitutional : Negative for symptoms  Skin: Negative for skin symptoms  Eyes: Negative for eye symptoms  Ear/Nose/Throat : Negative for Ear/Nose/Throat symptoms  Hematologic/Lymphatic: Negative for Hematologic/Lymphatic symptoms  Cardiovascular : Negative for cardiovascular symptoms  Respiratory : Shortness of breath  Endocrine: Negative for endocrine symptoms  Musculoskeletal: Back pain Joint pain  Neurological: Negative for neurological symptoms  Psychologic: Negative for psychiatric symptoms

## 2021-04-05 NOTE — Progress Notes (Signed)
Fill and Pull Catheter Removal  Patient is present today for a catheter removal.  Patient was cleaned and prepped in a sterile fashion of sterile water/ saline was instilled into the bladder when the patient felt the urge to urinate. 29ml of water was then drained from the balloon.  A 16FR foley cath was removed from the bladder no complications were noted .  Patient as then given some time to void on their own.  Patient cannot void   Performed by: Makita Blow LPN  Follow up/ Additional notes: Per Md note

## 2021-04-07 ENCOUNTER — Ambulatory Visit (INDEPENDENT_AMBULATORY_CARE_PROVIDER_SITE_OTHER): Payer: Medicare Other

## 2021-04-07 ENCOUNTER — Other Ambulatory Visit: Payer: Self-pay

## 2021-04-07 DIAGNOSIS — R339 Retention of urine, unspecified: Secondary | ICD-10-CM | POA: Diagnosis not present

## 2021-04-07 NOTE — Patient Instructions (Signed)
Foley Catheter Care and Patient Education  Perform catheter care every day.  You can do this while in the shower, but NOT while taking a tub bath.  You will need the following supplies: -mild soap, such as Dove -water -a clean washcloth (not one already used for bathing) or a 4x4 piece of gauze -1 Cath-Secure -night drainage bag -2 alcohol swabs  Was you hands thoroughly with soap and water Using mild soap and water, clan your genital area Men should retract the foreskin, if needed, and clean the area, including the penis Women should separate the labia, and clean the area from front to back  3. Clean your urinary opening, which is where the catheter enters your body. 4. Clean the catheter from where it enters your body and then down, away from your body.  Hold the catheter at the point it enters your body so that you don't put tension on it. 5. Rinse the area well and dry it gently.  Changing the drainage bag You will change your drainage bag twice a day -in the morning after you shower, change he night bag to the leg bag -at night before you go to bed, change the leg bag to the night bag  Wash your hands thoroughly with soap and water Empty the urine from the drainage bag into the toilet before you change it Pinch off the catheter with your fingers and disconnect the used bag Wipe the end of the catheter using an alcohol pad Wipe the connector on the bag using the second alcohol pad Connect the clean bag to the catheter and release your finger pinch Check all connections.  Straighten any kinks or twits in the tubing  Caring for the Leg bag -always wear the leg bag below your knee.  This will help it drain. -keep the leg bag secure with the velcro straps.  If the straps leave a mark on your leg they are to tight and should be loosened.  Leaving the straps too tight can decrease you circulation and lead to blood clots. -empty the leg bag through the spout at the bottom every  2-4 hours as needed.  Do not let the bag become completely full. -do not lie down for longer than 2 hours while you are wearing the leg bag.  Caring for the Night Bag -always keep the night bag below the level of your bladder . -to hang your night bag while you sleep, place a clean plastic bag inside of a wastebasket.  Hang the night bag on the inside of the wastebasket.  Cleaning the Drainage bag Wash you hands thoroughly with soap and water. Rinse the equipment with cool water.  Do not use hot water it can damage the plastic equipment. Was the equipment with a mild liquid detergent (ivory) and rinse with cool water. To decrease odor, fill the bag halfway with a mixture of 1 part vinegar and 3 parts water. Shake the bag and let it sit for 15 minutes. Rinse the bag with cool water and hang it up to dry.  Preventing infection -keep the drainage bag below the level of your bladder and off the floor at all times. -keep the catheter secured to your thigh to prevent it from moving. -do not lie on or block the flow of urine in the tubing. -shower daily to keep the catheter clean. -clean your hands before and after touching the catheter or bag. -the spout of the drainage bag should never touch the side of   the toilet or any emptying container.  Special Points -You may see some blood or urine around where the catheter enters your body, especially when walking or having a bowel movement.  This is normal, as long as there is urine draining into the drainage bag.  If you experience significant leakage around  catheter tube where it enters your urethra possibly associated with lower abdominal cramping you could be having a bladder spasm.  Please notify your doctor and we can prescribe you a medication to improve your symptoms. -drink 1-2 glasses of liquids every 2 hours while you're awake.  Call your doctor immediately if -your catheter comes out, do not try to replace it yourself -you have  temperature of 101F (38.8C) or higher -you have decrease in the amount of urine you are making -you have foul-smelling urine -you have bright red blood or large blood clots in your urine -you have abdominal pain and no urine in your catheter bag   

## 2021-04-07 NOTE — Progress Notes (Signed)
post void residual= 609  Simple Catheter Placement  Due to urinary retention patient is present today for a foley cath placement.  Patient was cleaned and prepped in a sterile fashion with betadine. A 16 FR foley catheter was inserted, urine return was noted  610  ml, urine was yellow in color.  The balloon was filled with 10cc of sterile water.  A leg bag was attached for drainage. Patient was also given a night bag to take home and was given instruction on how to change from one bag to another.  Patient was given instruction on proper catheter care.  Patient tolerated well, no complications were noted   Performed by: Ivett Luebbe LPN  Additional notes/ Follow up: Will call patient with f/u after speaking with Dr.

## 2021-04-08 ENCOUNTER — Telehealth: Payer: Self-pay

## 2021-04-08 NOTE — Telephone Encounter (Signed)
Daughter accepted appointment for 8/30.

## 2021-04-11 NOTE — Progress Notes (Signed)
History of Present Illness: Anna Mccoy is a 87 y.o. year old female for follow-up of urinary retention.  She has a catheter in again.  5.25.2021: referred for issues with incomplete bladder emptying. She states that she will now have a strong urge to urinate but find herself unable to void any significant amount of urine even when straining. Of note, she has a hx of bladder prolapse that was managed with pessary but she is no longer using this (she cannot recall why she is no longer using this). She also has a hx of urge incontinence for which she is wearing pads (1 QD). She has limited mobility but is able to navigate her house and make it to the restroom without much issue with assistance of her walker. She has UUI. She has around 2-3 UTI's per year -- these are never symptomatic and are instead incidentally dx'd per UA. Her main concern today is this recent change in her ability to empty her bladder. On average, she finds her sx's quite bothersome (rating them an 8/10). Placed on bethanechol.   8.31.2021: Pt denies any recent UTIs or gross hematuria and reports an adequate FOS. Pt notes that bethanechol has alleviated her bladder retention. Pt must wear pads for urinary incontinence. Pt is pleased with her symptoms and submits no questions or concerns at this time.   8.23.2022: Recent visit to the emergency room on the 15th of this month for probable UTI.  Urine looked infected but culture was not performed.  Catheter was placed, her daughter thinks that 400 cc of urine was obtained upon catheterization.  She did have a CT scan for hematochezia back in June of this year.  Results as follows:  No acute intra-abdominal pathology identified. No definite radiographic explanation for the patient's hematochezia.   Severe pancolonic diverticulosis without superimposed inflammatory change.   Marked bladder distension with large urachal diverticulum noted. This diverticulum has enlarged in size  since prior examination and may contribute to incomplete voiding and bladder dysfunction. No associated mass identified.  8.30.2022: At her visit last week she was given a voiding trial.  She came in a couple of days later with a residual urine volume of 600 mL.  Catheter was put back in. Past Medical History:  Diagnosis Date   Anemia    Arthritis    Cerebral infarction due to unspecified mechanism    Cholesterol retinal embolus of both eyes 09/28/2015   Chronic diarrhea of unknown origin 12/04/2015   Chronic diastolic CHF (congestive heart failure) (HCC)    CVA (cerebral infarction) 02/02/2015   DDD (degenerative disc disease), lumbar    Edema    Essential tremor 09/29/2015   Gait disturbance 06/06/2017   Hx-TIA (transient ischemic attack)    Hypertension    Hypertensive heart disease with heart failure (HCC) 01/13/2016   Hypertrophic toenail 09/28/2015   Hypothyroidism    HYPOTHYROIDISM   Impacted cerumen of right ear 01/10/2018   Kyphosis (acquired) (postural) 10/07/2016   Menopausal state age 54   Mitral valve regurgitation    TRIVIAL   Osteoporosis 09/28/2015   Peripheral neuropathy 09/28/2015   Post-menopausal bleeding 02/2011   Endo Biopsy 7/12 benign, no hyperplasia   Procidentia of uterus    uses pessary   PVC's (premature ventricular contractions)    RBBB    Rheumatoid arthritis(714.0)    Seizure cerebral (HCC)    Stroke (HCC) 1996   pt estimation   Sudden idiopathic hearing loss of right ear 01/18/2018  Syncope 11/10/2017   Vitamin D deficiency disease     Past Surgical History:  Procedure Laterality Date   BACK SURGERY     CHOLECYSTECTOMY, LAPAROSCOPIC  2002   COLONOSCOPY W/ BIOPSIES  6/09   sigmoid diverticuli recheck 5 years   EYE SURGERY  04/24/14   Cataract sx in right eye with lens replacement.   LAMINECTOMY AND MICRODISCECTOMY LUMBAR SPINE  01/23/09   VAGINAL DELIVERY     x5    Home Medications:  (Not in a hospital admission)   Allergies:  Allergies   Allergen Reactions   Ace Inhibitors Cough   Ciprofloxacin Diarrhea   Lidocaine Other (See Comments)    unknown   Neosporin [Neomycin-Bacitracin Zn-Polymyx] Other (See Comments)    unknown   Sulfa Antibiotics Other (See Comments)    unknown    Family History  Problem Relation Age of Onset   Hypertension Mother    Stroke Mother    Hypertension Sister    Diabetes Sister    Hypertension Sister    Diabetes Sister     Social History:  reports that she has never smoked. She has never used smokeless tobacco. She reports that she does not drink alcohol and does not use drugs.  ROS: A complete review of systems was performed.  All systems are negative except for pertinent findings as noted.  Physical Exam:  Vital signs in last 24 hours: @VSRANGES @ General:  Alert and oriented, No acute distress.  In wheelchair HEENT: Normocephalic, atraumatic Neck: No JVD or lymphadenopathy Cardiovascular: Regular rate  Extremities: No edema Neurologic: Grossly intact  I have reviewed prior pt notes  I have reviewed notes from referring/previous physicians2  I have independently reviewed prior imaging    Impression/Assessment:  Urinary retention in elderly female who is weak and carries a very large residual with recent UTI  Plan:  I have strongly suggested that we keep the catheter in place.  I think it is worthwhile to teach the family how to use a plug in the catheter so that she can unplug, drained the bladder and replace the plug.  I will have her come back in in about 3 weeks for catheter change.  They know to do the catheter drainage at least 3 times a day.  Keddrick Wyne 04/11/2021, 8:04 PM  04/13/2021. Arman Loy MD

## 2021-04-12 ENCOUNTER — Other Ambulatory Visit: Payer: Self-pay

## 2021-04-12 ENCOUNTER — Ambulatory Visit (INDEPENDENT_AMBULATORY_CARE_PROVIDER_SITE_OTHER): Payer: Medicare Other | Admitting: Urology

## 2021-04-12 ENCOUNTER — Encounter: Payer: Self-pay | Admitting: Urology

## 2021-04-12 VITALS — BP 168/85 | HR 61

## 2021-04-12 DIAGNOSIS — R339 Retention of urine, unspecified: Secondary | ICD-10-CM

## 2021-04-12 NOTE — Progress Notes (Signed)
Urological Symptom Review  Patient is experiencing the following symptoms: Patient has catheter in place   Review of Systems  Gastrointestinal (upper)  : Negative for upper GI symptoms  Gastrointestinal (lower) : Negative for lower GI symptoms  Constitutional : Negative for symptoms  Skin: Negative for skin symptoms  Eyes: Negative for eye symptoms  Ear/Nose/Throat : Negative for Ear/Nose/Throat symptoms  Hematologic/Lymphatic: Negative for Hematologic/Lymphatic symptoms  Cardiovascular : Negative for cardiovascular symptoms  Respiratory : Negative for respiratory symptoms  Endocrine: Negative for endocrine symptoms  Musculoskeletal: Negative for musculoskeletal symptoms  Neurological: Negative for neurological symptoms  Psychologic: Negative for psychiatric symptoms

## 2021-04-19 ENCOUNTER — Ambulatory Visit: Payer: Medicare Other | Admitting: Urology

## 2021-05-02 NOTE — Progress Notes (Signed)
History of Present Illness: Anna Mccoy is a 87 y.o. year old female 5.25.2021: referred for issues with incomplete bladder emptying. She states that she will now have a strong urge to urinate but find herself unable to void any significant amount of urine even when straining. Of note, she has a hx of bladder prolapse that was managed with pessary but she is no longer using this (she cannot recall why she is no longer using this). She also has a hx of urge incontinence for which she is wearing pads (1 QD). She has limited mobility but is able to navigate her house and make it to the restroom without much issue with assistance of her walker. She has UUI. She has around 2-3 UTI's per year -- these are never symptomatic and are instead incidentally dx'd per UA. Her main concern today is this recent change in her ability to empty her bladder. On average, she finds her sx's quite bothersome (rating them an 8/10). Placed on bethanechol.   8.31.2021: Pt denies any recent UTIs or gross hematuria and reports an adequate FOS. Pt notes that bethanechol has alleviated her bladder retention. Pt must wear pads for urinary incontinence. Pt is pleased with her symptoms and submits no questions or concerns at this time.   8.23.2022: Recent visit to the emergency room on the 15th of this month for probable UTI.  Urine looked infected but culture was not performed.  Catheter was placed, her daughter thinks that 400 cc of urine was obtained upon catheterization.  She did have a CT scan for hematochezia back in June of this year.  Results as follows:  No acute intra-abdominal pathology identified. No definite radiographic explanation for the patient's hematochezia.   Severe pancolonic diverticulosis without superimposed inflammatory change.   Marked bladder distension with large urachal diverticulum noted. This diverticulum has enlarged in size since prior examination and may contribute to incomplete voiding and  bladder dysfunction. No associated mass identified.  8.30.2022: At her visit last week she was given a voiding trial.  She came in a couple of days later with a residual urine volume of 600 mL.  Catheter was put back in.  Past Medical History:  Diagnosis Date   Anemia    Arthritis    Cerebral infarction due to unspecified mechanism    Cholesterol retinal embolus of both eyes 09/28/2015   Chronic diarrhea of unknown origin 12/04/2015   Chronic diastolic CHF (congestive heart failure) (HCC)    CVA (cerebral infarction) 02/02/2015   DDD (degenerative disc disease), lumbar    Edema    Essential tremor 09/29/2015   Gait disturbance 06/06/2017   Hx-TIA (transient ischemic attack)    Hypertension    Hypertensive heart disease with heart failure (HCC) 01/13/2016   Hypertrophic toenail 09/28/2015   Hypothyroidism    HYPOTHYROIDISM   Impacted cerumen of right ear 01/10/2018   Kyphosis (acquired) (postural) 10/07/2016   Menopausal state age 89   Mitral valve regurgitation    TRIVIAL   Osteoporosis 09/28/2015   Peripheral neuropathy 09/28/2015   Post-menopausal bleeding 02/2011   Endo Biopsy 7/12 benign, no hyperplasia   Procidentia of uterus    uses pessary   PVC's (premature ventricular contractions)    RBBB    Rheumatoid arthritis(714.0)    Seizure cerebral (HCC)    Stroke (HCC) 1996   pt estimation   Sudden idiopathic hearing loss of right ear 01/18/2018   Syncope 11/10/2017   Vitamin D deficiency disease  Past Surgical History:  Procedure Laterality Date   BACK SURGERY     CHOLECYSTECTOMY, LAPAROSCOPIC  2002   COLONOSCOPY W/ BIOPSIES  6/09   sigmoid diverticuli recheck 5 years   EYE SURGERY  04/24/14   Cataract sx in right eye with lens replacement.   LAMINECTOMY AND MICRODISCECTOMY LUMBAR SPINE  01/23/09   VAGINAL DELIVERY     x5    Home Medications:  (Not in a hospital admission)   Allergies:  Allergies  Allergen Reactions   Ace Inhibitors Cough   Ciprofloxacin  Diarrhea   Lidocaine Other (See Comments)    unknown   Neosporin [Neomycin-Bacitracin Zn-Polymyx] Other (See Comments)    unknown   Sulfa Antibiotics Other (See Comments)    unknown    Family History  Problem Relation Age of Onset   Hypertension Mother    Stroke Mother    Hypertension Sister    Diabetes Sister    Hypertension Sister    Diabetes Sister     Social History:  reports that she has never smoked. She has never used smokeless tobacco. She reports that she does not drink alcohol and does not use drugs.  ROS: A complete review of systems was performed.  All systems are negative except for pertinent findings as noted.      Pt not seen--no charge  Chelsea Aus 05/02/2021, 8:21 PM  Bertram Millard. Emalia Witkop MD

## 2021-05-03 ENCOUNTER — Encounter: Payer: Self-pay | Admitting: Urology

## 2021-05-03 ENCOUNTER — Other Ambulatory Visit: Payer: Self-pay

## 2021-05-03 ENCOUNTER — Ambulatory Visit (INDEPENDENT_AMBULATORY_CARE_PROVIDER_SITE_OTHER): Payer: Medicare Other

## 2021-05-03 DIAGNOSIS — R339 Retention of urine, unspecified: Secondary | ICD-10-CM

## 2021-05-03 NOTE — Progress Notes (Signed)
Cath Change/ Replacement  Patient is present today for a catheter change due to urinary retention.  70ml of water was removed from the balloon, a 16FR foley cath was removed with out difficulty.  Patient was cleaned and prepped in a sterile fashion with betadine. A 16 FR foley cath was replaced into the bladder no complications were noted Urine return was noted 45ml and urine was yellow in color. The balloon was filled with 63ml of sterile water. A bedside bag was attached for drainage.  A night bag was also given to the patient and patient was given instruction on how to change from one bag to another. Patient was given proper instruction on catheter care.    Performed by: Cammeron Greis LPN  Follow up: Keep next OV

## 2021-05-03 NOTE — Patient Instructions (Signed)
Foley Catheter Care and Patient Education  Perform catheter care every day.  You can do this while in the shower, but NOT while taking a tub bath.  You will need the following supplies: -mild soap, such as Dove -water -a clean washcloth (not one already used for bathing) or a 4x4 piece of gauze -1 Cath-Secure -night drainage bag -2 alcohol swabs  Was you hands thoroughly with soap and water Using mild soap and water, clan your genital area Men should retract the foreskin, if needed, and clean the area, including the penis Women should separate the labia, and clean the area from front to back  3. Clean your urinary opening, which is where the catheter enters your body. 4. Clean the catheter from where it enters your body and then down, away from your body.  Hold the catheter at the point it enters your body so that you don't put tension on it. 5. Rinse the area well and dry it gently.  Changing the drainage bag You will change your drainage bag twice a day -in the morning after you shower, change he night bag to the leg bag -at night before you go to bed, change the leg bag to the night bag  Wash your hands thoroughly with soap and water Empty the urine from the drainage bag into the toilet before you change it Pinch off the catheter with your fingers and disconnect the used bag Wipe the end of the catheter using an alcohol pad Wipe the connector on the bag using the second alcohol pad Connect the clean bag to the catheter and release your finger pinch Check all connections.  Straighten any kinks or twits in the tubing  Caring for the Leg bag -always wear the leg bag below your knee.  This will help it drain. -keep the leg bag secure with the velcro straps.  If the straps leave a mark on your leg they are to tight and should be loosened.  Leaving the straps too tight can decrease you circulation and lead to blood clots. -empty the leg bag through the spout at the bottom every  2-4 hours as needed.  Do not let the bag become completely full. -do not lie down for longer than 2 hours while you are wearing the leg bag.  Caring for the Night Bag -always keep the night bag below the level of your bladder . -to hang your night bag while you sleep, place a clean plastic bag inside of a wastebasket.  Hang the night bag on the inside of the wastebasket.  Cleaning the Drainage bag Wash you hands thoroughly with soap and water. Rinse the equipment with cool water.  Do not use hot water it can damage the plastic equipment. Was the equipment with a mild liquid detergent (ivory) and rinse with cool water. To decrease odor, fill the bag halfway with a mixture of 1 part vinegar and 3 parts water. Shake the bag and let it sit for 15 minutes. Rinse the bag with cool water and hang it up to dry.  Preventing infection -keep the drainage bag below the level of your bladder and off the floor at all times. -keep the catheter secured to your thigh to prevent it from moving. -do not lie on or block the flow of urine in the tubing. -shower daily to keep the catheter clean. -clean your hands before and after touching the catheter or bag. -the spout of the drainage bag should never touch the side of   the toilet or any emptying container.  Special Points -You may see some blood or urine around where the catheter enters your body, especially when walking or having a bowel movement.  This is normal, as long as there is urine draining into the drainage bag.  If you experience significant leakage around  catheter tube where it enters your urethra possibly associated with lower abdominal cramping you could be having a bladder spasm.  Please notify your doctor and we can prescribe you a medication to improve your symptoms. -drink 1-2 glasses of liquids every 2 hours while you're awake.  Call your doctor immediately if -your catheter comes out, do not try to replace it yourself -you have  temperature of 101F (38.8C) or higher -you have decrease in the amount of urine you are making -you have foul-smelling urine -you have bright red blood or large blood clots in your urine -you have abdominal pain and no urine in your catheter bag   

## 2021-05-16 ENCOUNTER — Ambulatory Visit (INDEPENDENT_AMBULATORY_CARE_PROVIDER_SITE_OTHER): Payer: Medicare Other

## 2021-05-16 ENCOUNTER — Other Ambulatory Visit: Payer: Self-pay

## 2021-05-16 DIAGNOSIS — R339 Retention of urine, unspecified: Secondary | ICD-10-CM | POA: Diagnosis not present

## 2021-05-16 NOTE — Progress Notes (Signed)
Patient came to office today for foley catheter anchor and to look at skin issues per daughter.  Catheter anchor placed and foley catheter secured. Patient has open skin breakdown from catheter tubing laying on leg while patient sleeps.   Daughter will use barrier cream over skin breakdown to help with healing and will place cloth in between patient leg and tubing to help prevent future breakdowns. Daughter voiced understanding.  No other issues noted.

## 2021-05-21 ENCOUNTER — Other Ambulatory Visit: Payer: Self-pay | Admitting: Urology

## 2021-05-21 DIAGNOSIS — R339 Retention of urine, unspecified: Secondary | ICD-10-CM

## 2021-05-23 NOTE — Progress Notes (Signed)
History of Present Illness: Anna Mccoy is a 87 y.o. year old female presenting for followup of urinary retention.  5.25.2021: referred for issues with incomplete bladder emptying. She states that she will now have a strong urge to urinate but find herself unable to void any significant amount of urine even when straining. Of note, she has a hx of bladder prolapse that was managed with pessary but she is no longer using this (she cannot recall why she is no longer using this). She also has a hx of urge incontinence for which she is wearing pads (1 QD). She has limited mobility but is able to navigate her house and make it to the restroom without much issue with assistance of her walker. She has UUI. She has around 2-3 UTI's per year -- these are never symptomatic and are instead incidentally dx'd per UA. Her main concern today is this recent change in her ability to empty her bladder. On average, she finds her sx's quite bothersome (rating them an 8/10). Placed on bethanechol.   8.31.2021: She feels that bethanechol has alleviated her bladder retention. Pt must wear pads for urinary incontinence. Pt is pleased with her symptoms and submits no questions or concerns at this time.   8.23.2022: Recent visit to the emergency room on the 15th of this month for probable UTI.  Urine looked infected but culture was not performed.  Catheter was placed, her daughter thinks that 400 cc of urine was obtained upon catheterization.  She did have a CT scan for hematochezia back in June of this year.  Results as follows:  No acute intra-abdominal pathology identified. No definite radiographic explanation for the patient's hematochezia.   Severe pancolonic diverticulosis without superimposed inflammatory change.   Marked bladder distension with large urachal diverticulum noted. This diverticulum has enlarged in size since prior examination and may contribute to incomplete voiding and bladder dysfunction.  No associated mass identified.  8.30.2022: At her visit last week she was given a voiding trial.  She came in a couple of days later with a residual urine volume of 600 mL.  Catheter was put back in.  10.11.2022: Turns for catheter change.  No change in physical condition.  Past Medical History:  Diagnosis Date   Anemia    Arthritis    Cerebral infarction due to unspecified mechanism    Cholesterol retinal embolus of both eyes 09/28/2015   Chronic diarrhea of unknown origin 12/04/2015   Chronic diastolic CHF (congestive heart failure) (HCC)    CVA (cerebral infarction) 02/02/2015   DDD (degenerative disc disease), lumbar    Edema    Essential tremor 09/29/2015   Gait disturbance 06/06/2017   Hx-TIA (transient ischemic attack)    Hypertension    Hypertensive heart disease with heart failure (HCC) 01/13/2016   Hypertrophic toenail 09/28/2015   Hypothyroidism    HYPOTHYROIDISM   Impacted cerumen of right ear 01/10/2018   Kyphosis (acquired) (postural) 10/07/2016   Menopausal state age 18   Mitral valve regurgitation    TRIVIAL   Osteoporosis 09/28/2015   Peripheral neuropathy 09/28/2015   Post-menopausal bleeding 02/2011   Endo Biopsy 7/12 benign, no hyperplasia   Procidentia of uterus    uses pessary   PVC's (premature ventricular contractions)    RBBB    Rheumatoid arthritis(714.0)    Seizure cerebral (HCC)    Stroke (HCC) 1996   pt estimation   Sudden idiopathic hearing loss of right ear 01/18/2018   Syncope 11/10/2017   Vitamin  D deficiency disease     Past Surgical History:  Procedure Laterality Date   BACK SURGERY     CHOLECYSTECTOMY, LAPAROSCOPIC  2002   COLONOSCOPY W/ BIOPSIES  6/09   sigmoid diverticuli recheck 5 years   EYE SURGERY  04/24/14   Cataract sx in right eye with lens replacement.   LAMINECTOMY AND MICRODISCECTOMY LUMBAR SPINE  01/23/09   VAGINAL DELIVERY     x5    Home Medications:  (Not in a hospital admission)   Allergies:  Allergies  Allergen  Reactions   Ace Inhibitors Cough   Ciprofloxacin Diarrhea   Lidocaine Other (See Comments)    unknown   Neosporin [Neomycin-Bacitracin Zn-Polymyx] Other (See Comments)    unknown   Sulfa Antibiotics Other (See Comments)    unknown    Family History  Problem Relation Age of Onset   Hypertension Mother    Stroke Mother    Hypertension Sister    Diabetes Sister    Hypertension Sister    Diabetes Sister     Social History:  reports that she has never smoked. She has never used smokeless tobacco. She reports that she does not drink alcohol and does not use drugs.  ROS: A complete review of systems was performed.  All systems are negative except for pertinent findings as noted.  Physical Exam:  Vital signs in last 24 hours: @VSRANGES @ General:  Alert and oriented, No acute distress.  In wheelchair   Laboratory Data:  No results found for this or any previous visit (from the past 24 hour(s)). No results found for this or any previous visit (from the past 240 hour(s)). Creatinine: No results for input(s): CREATININE in the last 168 hours.  Radiologic Imaging: No results found.  Impression/Assessment:  Atonic bladder with need for long-term catheter placement  Plan:  We will change catheter today  We will try to arrange monthly home health care visits for catheter changes using a 14 French Foley  I will see back in 6 months  05/23/2021, 8:17 PM  07/23/2021. Mirka Barbone MD

## 2021-05-24 ENCOUNTER — Ambulatory Visit (INDEPENDENT_AMBULATORY_CARE_PROVIDER_SITE_OTHER): Payer: Medicare Other | Admitting: Urology

## 2021-05-24 ENCOUNTER — Other Ambulatory Visit: Payer: Self-pay

## 2021-05-24 VITALS — BP 165/79 | HR 71 | Temp 98.5°F

## 2021-05-24 DIAGNOSIS — N3000 Acute cystitis without hematuria: Secondary | ICD-10-CM | POA: Diagnosis not present

## 2021-05-24 DIAGNOSIS — R339 Retention of urine, unspecified: Secondary | ICD-10-CM

## 2021-05-24 DIAGNOSIS — R3129 Other microscopic hematuria: Secondary | ICD-10-CM | POA: Diagnosis not present

## 2021-05-24 NOTE — Progress Notes (Signed)
Cath Change/ Replacement  Patient is present today for a catheter change due to urinary retention.  86ml of water was removed from the balloon, a 16FR foley cath was removed with out difficulty.  Patient was cleaned and prepped in a sterile fashion with betadine. A 16 FR foley cath was replaced into the bladder no complications were noted Urine return was noted 45ml and urine was yellow in color. The balloon was filled with 76ml of sterile water. A bedside bag was attached for drainage.  Patient was given proper instruction on catheter care.    Performed by: Sherrol Vicars LPN  Follow up: Per MD note

## 2021-05-24 NOTE — Patient Instructions (Signed)

## 2021-05-24 NOTE — Progress Notes (Signed)
Urological Symptom Review  Patient is experiencing the following symptoms: N/a   Review of Systems  Gastrointestinal (upper)  : Negative for upper GI symptoms  Gastrointestinal (lower) : Negative for lower GI symptoms  Constitutional : Negative for symptoms  Skin: Skin rash/lesion  Eyes: Negative for eye symptoms  Ear/Nose/Throat : Negative for Ear/Nose/Throat symptoms  Hematologic/Lymphatic: Negative for Hematologic/Lymphatic symptoms  Cardiovascular : Negative for cardiovascular symptoms  Respiratory : Negative for respiratory symptoms  Endocrine: Negative for endocrine symptoms  Musculoskeletal: Joint pain  Neurological: Negative for neurological symptoms  Psychologic: Negative for psychiatric symptoms

## 2021-05-30 ENCOUNTER — Ambulatory Visit: Payer: Medicare Other

## 2021-06-07 ENCOUNTER — Telehealth: Payer: Self-pay

## 2021-06-09 ENCOUNTER — Other Ambulatory Visit: Payer: Self-pay

## 2021-06-09 DIAGNOSIS — R339 Retention of urine, unspecified: Secondary | ICD-10-CM

## 2021-06-09 NOTE — Addendum Note (Signed)
Addended by: Ferdinand Lango on: 06/09/2021 08:48 AM   Modules accepted: Orders

## 2021-06-09 NOTE — Progress Notes (Signed)
Daughter requesting Home Health Referral for monthly catheter changes to be completed at home due to patient mobility issues.  Home Health order placed and faxed to [facility]  [phone]

## 2021-06-13 ENCOUNTER — Telehealth: Payer: Self-pay

## 2021-06-13 NOTE — Telephone Encounter (Signed)
Seth Bake with [facility] called requesting verbal order to provide catheter changes. Order given.

## 2021-07-28 NOTE — Telephone Encounter (Signed)
error 

## 2021-07-29 ENCOUNTER — Telehealth: Payer: Self-pay

## 2021-07-29 NOTE — Telephone Encounter (Signed)
Ariel from [facility] urology called and advised [facility] Cornfield from [facility] left a message regarding patient catheter changes. Judeth Cornfield advised that they are no longer offering monthly catheter changes. I wasn't sure if we will make her appts for here or refer her to somewhere else for home health.     Judeth Cornfield [phone]

## 2021-08-02 ENCOUNTER — Other Ambulatory Visit: Payer: Self-pay

## 2021-08-02 DIAGNOSIS — R339 Retention of urine, unspecified: Secondary | ICD-10-CM

## 2021-08-02 NOTE — Progress Notes (Signed)
Judeth Cornfield from [facility] states that they will no longer come out to just do monthly catheter changes for patient and suggest referral to  [facility].  Judeth Cornfield states they will continue to provide catheter care until another [facility] is set up.  Daughter requesting Home Health Referral for monthly catheter changes to be completed at home due to patient mobility issues.   Home Health order placed and faxed to [facility] @336  [phone] & [phone]

## 2021-08-02 NOTE — Telephone Encounter (Signed)
Referral sent to [facility]. [facility] from [facility] states they will continue to care for patient until transfer is made.

## 2021-09-06 ENCOUNTER — Telehealth: Payer: Self-pay

## 2021-09-06 NOTE — Telephone Encounter (Signed)
Bethany form [facility] called this morning with Home Health and stated that they will no longer will be going out to service Ms Anna Mccoy due to her insurance , that she will need to be referred to a different Home Health Company.

## 2021-09-21 ENCOUNTER — Other Ambulatory Visit: Payer: Self-pay

## 2021-09-21 ENCOUNTER — Ambulatory Visit (INDEPENDENT_AMBULATORY_CARE_PROVIDER_SITE_OTHER): Payer: Medicare Other | Admitting: Physician Assistant

## 2021-09-21 ENCOUNTER — Encounter: Payer: Self-pay | Admitting: Urology

## 2021-09-21 VITALS — BP 154/85 | HR 80

## 2021-09-21 DIAGNOSIS — R339 Retention of urine, unspecified: Secondary | ICD-10-CM

## 2021-09-21 NOTE — Progress Notes (Signed)
Assessment: 1. Urinary retention   2. Incomplete emptying of bladder     Plan: Catheter is flushed with good flow noted in her urine bag is changed.  Patient and her daughter are reassured that urine sediment is often a normal finding with long-term indwelling catheters.  If the patient does develop symptoms of illness or altered mental status, her daughter is advised to get her to the emergency department for evaluation.  She will keep her upcoming follow-up as scheduled.  Chief Complaint: No chief complaint on file.   HPI: Anna Mccoy is a 88 y.o. female who presents for evaluation of drainage from indwelling catheter. She is with her daughter who has noticed debris in the tubing and her urine bag for the past 2 days.  Patient has had no symptoms of illness and her daughter states she has had no fever, change in mental status, complaints of pain, vomiting or diarrhea.  She did seem a bit fatigued yesterday but is back to her normal energy today.  She also reports that the patient's primary care provider treated her with antibiotics a few weeks ago for a UTI.  The patient has also been taken off of both her diuretics due to hyponatremia..   Portions of the above documentation were copied from a prior visit for review purposes only.  Allergies: Allergies  Allergen Reactions   Ace Inhibitors Cough   Ciprofloxacin Diarrhea   Lidocaine Other (See Comments)    unknown   Neosporin [Neomycin-Bacitracin Zn-Polymyx] Other (See Comments)    unknown   Sulfa Antibiotics Other (See Comments)    unknown    PMH: Past Medical History:  Diagnosis Date   Anemia    Arthritis    Cerebral infarction due to unspecified mechanism    Cholesterol retinal embolus of both eyes 09/28/2015   Chronic diarrhea of unknown origin 12/04/2015   Chronic diastolic CHF (congestive heart failure) (HCC)    CVA (cerebral infarction) 02/02/2015   DDD (degenerative disc disease), lumbar    Edema     Essential tremor 09/29/2015   Gait disturbance 06/06/2017   Hx-TIA (transient ischemic attack)    Hypertension    Hypertensive heart disease with heart failure ([place]) 01/13/2016   Hypertrophic toenail 09/28/2015   Hypothyroidism    HYPOTHYROIDISM   Impacted cerumen of right ear 01/10/2018   Kyphosis (acquired) (postural) 10/07/2016   Menopausal state age 60   Mitral valve regurgitation    TRIVIAL   Osteoporosis 09/28/2015   Peripheral neuropathy 09/28/2015   Post-menopausal bleeding 02/2011   Endo Biopsy 7/12 benign, no hyperplasia   Procidentia of uterus    uses pessary   PVC's (premature ventricular contractions)    RBBB    Rheumatoid arthritis(714.0)    Seizure cerebral ([place])    Stroke ([place]) 1996   pt estimation   Sudden idiopathic hearing loss of right ear 01/18/2018   Syncope 11/10/2017   Vitamin D deficiency disease     PSH: Past Surgical History:  Procedure Laterality Date   BACK SURGERY     CHOLECYSTECTOMY, LAPAROSCOPIC  2002   COLONOSCOPY W/ BIOPSIES  6/09   sigmoid diverticuli recheck 5 years   EYE SURGERY  04/24/14   Cataract sx in right eye with lens replacement.   LAMINECTOMY AND MICRODISCECTOMY LUMBAR SPINE  01/23/09   VAGINAL DELIVERY     x5    SH: Social History   Tobacco Use   Smoking status: Never   Smokeless tobacco: Never  Vaping Use  Vaping Use: Never used  Substance Use Topics   Alcohol use: No   Drug use: No    ROS: Constitutional:  Negative for fever, chills, weight loss CV: Negative for chest pain, previous MI, hypertension Respiratory:  Negative for shortness of breath, wheezing, sleep apnea, frequent cough GI:  Negative for nausea, vomiting, bloody stool, GERD  PE: BP (!) 154/85    Pulse 80    LMP 08/14/1981 (Approximate)  GENERAL APPEARANCE:  Well appearing, well developed, well nourished, NAD HEENT:  Atraumatic, normocephalic NECK:  Supple. Trachea midline ABDOMEN:  Soft, non-tender, no masses EXTREMITIES: Mild lower extremity edema  with trace pitting.  Patient is wheelchair mobile. NEUROLOGIC:  Alert and cooperative MENTAL STATUS:  appropriate BACK:  Non-tender to palpation, No CVAT SKIN:  Warm, dry, and intact   Results: Laboratory Data: Lab Results  Component Value Date   WBC 10.8 (H) 03/28/2021   HGB 13.0 03/28/2021   HCT 38.5 03/28/2021   MCV 97.2 03/28/2021   PLT 228 03/28/2021    Lab Results  Component Value Date   CREATININE 0.95 03/28/2021    No results found for: PSA  No results found for: TESTOSTERONE  Lab Results  Component Value Date   HGBA1C 5.5 02/03/2015    Urinalysis    Component Value Date/Time   COLORURINE YELLOW 03/29/2021 0030   APPEARANCEUR TURBID (A) 03/29/2021 0030   APPEARANCEUR Clear 04/13/2020 1524   LABSPEC 1.008 03/29/2021 0030   PHURINE 6.0 03/29/2021 0030   GLUCOSEU NEGATIVE 03/29/2021 0030   HGBUR SMALL (A) 03/29/2021 0030   BILIRUBINUR NEGATIVE 03/29/2021 0030   BILIRUBINUR Negative 04/13/2020 1524   KETONESUR NEGATIVE 03/29/2021 0030   PROTEINUR 100 (A) 03/29/2021 0030   UROBILINOGEN negative (A) 01/06/2020 1049   UROBILINOGEN 1.0 02/04/2015 0539   NITRITE NEGATIVE 03/29/2021 0030   LEUKOCYTESUR LARGE (A) 03/29/2021 0030    Lab Results  Component Value Date   LABMICR See below: 04/13/2020   WBCUA >30 (A) 04/13/2020   LABEPIT >10 (A) 04/13/2020   BACTERIA RARE (A) 03/29/2021    Pertinent Imaging:  No results found for this or any previous visit.  No results found for this or any previous visit.  No results found for this or any previous visit.  No results found for this or any previous visit.  Results for orders placed during the hospital encounter of 01/13/20  US RENAL  Narrative CLINICAL DATA:  Initial evaluation for hematuria.  EXAM: RENAL / URINARY TRACT ULTRASOUND COMPLETE  COMPARISON:  Prior CT from 07/06/2010.  FINDINGS: Right Kidney:  Renal measurements: 10.1 x 3.8 x 3.7 cm = volume: 74 mL. Echogenicity within normal  limits. No nephrolithiasis or hydronephrosis. Prominent extrarenal pelvis noted. No focal renal mass.  Left Kidney:  Renal measurements: 10.5 x 3.7 x 3.7 cm = volume: 76.4 mL. Echogenicity within normal limits. No nephrolithiasis or hydronephrosis. No focal renal mass.  Bladder:  Probable diverticulum seen extending from the superior and right aspect of the bladder noted, also partially seen on prior CT from 2011. Partially distended bladder otherwise within normal limits.  Other:  None.  IMPRESSION: 1. Normal sonographic evaluation of the kidneys. No nephrolithiasis or hydronephrosis. 2. Bladder diverticulum extending from the superior aspect of the bladder, also seen on prior CT from 2011.   Electronically Signed By: Jeannine Boga M.D. On: 01/14/2020 01:07  No results found for this or any previous visit.  No results found for this or any previous visit.  No results found  for this or any previous visit.  No results found for this or any previous visit (from the past 24 hour(s)).

## 2021-09-21 NOTE — Progress Notes (Signed)
Foley

## 2021-09-22 NOTE — Telephone Encounter (Signed)
Daughter reports her current home health agency is working on transferring her care.

## 2021-09-28 ENCOUNTER — Other Ambulatory Visit: Payer: Self-pay

## 2021-09-28 ENCOUNTER — Ambulatory Visit (INDEPENDENT_AMBULATORY_CARE_PROVIDER_SITE_OTHER): Payer: Medicare Other

## 2021-09-28 DIAGNOSIS — R339 Retention of urine, unspecified: Secondary | ICD-10-CM

## 2021-09-28 DIAGNOSIS — T83038A Leakage of other indwelling urethral catheter, initial encounter: Secondary | ICD-10-CM

## 2021-09-28 NOTE — Progress Notes (Signed)
Anna Mccoy was seen today for catheter leaking during the night. Her cath was looked over and no leaking observed at this time. I irrigated her catheter putting of sterile water into her bladder through catheter, urine return was . Again there was no leaking observed at this time. I let her daughter know that depending how she is laying at night she may have kinked her cath causing the leak. A new bedside bag was attached and she was told to call us back if any further problems arise.

## 2021-11-22 ENCOUNTER — Ambulatory Visit: Payer: Medicare Other | Admitting: Urology

## 2021-11-23 ENCOUNTER — Telehealth: Payer: Self-pay

## 2021-11-23 ENCOUNTER — Ambulatory Visit: Payer: Medicare Other | Admitting: Physician Assistant

## 2021-11-23 ENCOUNTER — Other Ambulatory Visit: Payer: Self-pay | Admitting: Physician Assistant

## 2021-11-23 MED ORDER — OXYBUTYNIN CHLORIDE 5 MG PO TABS: 5.0000 mg | ORAL_TABLET | Freq: Three times a day (TID) | ORAL | 0 refills | Status: DC | PRN

## 2021-11-23 NOTE — Progress Notes (Signed)
The patient's Foley catheter was changed by home health today and working well, but patient continues to have previously reported symptoms of urinary leakage around the Foley.  Prescription for oxybutynin sent to her pharmacy.  She is advised to follow-up for Foley catheter check if symptoms persist. ?

## 2021-11-23 NOTE — Telephone Encounter (Signed)
Daughter called yesterday stating mother catheter was leaking a day or two after [facility] nurse changed it. Daughter request mother be seen. ?Appoint offered and accepted.  ?Daughter called back and canceled appointment stating [facility] nurse fixed the issue. ? ?Daughter called today and states patient is leaking around catheter. ?Please advise.  ?

## 2021-11-23 NOTE — Telephone Encounter (Signed)
Patients daughter made aware of rx sent to pharmacy for bladder spasms. Daughter offered appointment for tomorrow and accepted. Daughter made aware if patient stops voiding or leaking urine after office hours to go to ER for eval. Daughter voiced understanding.   ?

## 2021-11-24 ENCOUNTER — Telehealth: Payer: Self-pay

## 2021-11-24 ENCOUNTER — Ambulatory Visit (INDEPENDENT_AMBULATORY_CARE_PROVIDER_SITE_OTHER): Payer: Medicare Other | Admitting: Physician Assistant

## 2021-11-24 VITALS — BP 196/89 | HR 80 | Ht 59.0 in | Wt 115.0 lb

## 2021-11-24 DIAGNOSIS — R339 Retention of urine, unspecified: Secondary | ICD-10-CM

## 2021-11-24 DIAGNOSIS — Z978 Presence of other specified devices: Secondary | ICD-10-CM

## 2021-11-24 NOTE — Telephone Encounter (Signed)
Pt  left a voicemail 11-24-2021. ? ?Pt wants to know if she still needs her 6 month visit on Tues since she was in the office today? ? ?Please advise. ? ?Call back:  [phone] ? ?Thanks, ?Rosey Batheresa ?

## 2021-11-24 NOTE — Patient Instructions (Signed)
Please let your home health nurse know that you now have 15 ml in the catheter bulb. They will need to know this when they change the catheter. ?

## 2021-11-24 NOTE — Progress Notes (Signed)
Patient came in to have her foley catheter checked.  Drained 66ml of saline from the balloon, repositioned and filled balloon with 84mL of saline water.  Replaced foley stabilizer at a higher position on the leg due to tugging of the catheter.    ? ?Rilynn Habel, CMA ? ?

## 2021-11-24 NOTE — Telephone Encounter (Signed)
Message left on daughter voice mail to keep scheduled appointment with MD ?

## 2021-11-24 NOTE — Progress Notes (Signed)
? ?Assessment: ?1. Chronic indwelling Foley catheter   ?2. Urinary retention   ? ?Plan: ?Catheter adjusted, leg clip moved to relieve tension on the catheter tubing, and 32ml of sterile saline placed in the foley bulb after deflation.  Only 8 mL of saline was noted within the bulb prior to adjustment of the catheter.  She has follow-up already scheduled with Dr. Retta Diones which she will keep. ? ?Chief Complaint: ?No chief complaint on file. ? ? ?HPI: ?Anna Mccoy is a 88 y.o. female with history of chronic indwelling Foley catheter secondary to retention who presents for evaluation of urinary leakage around her Foley since catheter change last week.  The patient's daughter reports several days of significant leakage causing the patient to soil her clothing as well as her bed.  Home health changes catheter once monthly.  They did come out for specific visit to check her catheter and adjusted it but leakage continued.  Her daughter states the patient is actually had no leakage since last night.  She has had no complaints of pain, fever, gross hematuria.. ? ?Portions of the above documentation were copied from a prior visit for review purposes only. ? ?Allergies: ?Allergies  ?Allergen Reactions  ? Ace Inhibitors Cough  ? Ciprofloxacin Diarrhea  ? Lidocaine Other (See Comments)  ?  unknown  ? Neosporin [Neomycin-Bacitracin Zn-Polymyx] Other (See Comments)  ?  unknown  ? Sulfa Antibiotics Other (See Comments)  ?  unknown  ? ? ?PMH: ?Past Medical History:  ?Diagnosis Date  ? Anemia   ? Arthritis   ? Cerebral infarction due to unspecified mechanism   ? Cholesterol retinal embolus of both eyes 09/28/2015  ? Chronic diarrhea of unknown origin 12/04/2015  ? Chronic diastolic CHF (congestive heart failure) (HCC)   ? CVA (cerebral infarction) 02/02/2015  ? DDD (degenerative disc disease), lumbar   ? Edema   ? Essential tremor 09/29/2015  ? Gait disturbance 06/06/2017  ? Hx-TIA (transient ischemic attack)   ? Hypertension    ? Hypertensive heart disease with heart failure (HCC) 01/13/2016  ? Hypertrophic toenail 09/28/2015  ? Hypothyroidism   ? HYPOTHYROIDISM  ? Impacted cerumen of right ear 01/10/2018  ? Kyphosis (acquired) (postural) 10/07/2016  ? Menopausal state age 50  ? Mitral valve regurgitation   ? TRIVIAL  ? Osteoporosis 09/28/2015  ? Peripheral neuropathy 09/28/2015  ? Post-menopausal bleeding 02/2011  ? Endo Biopsy 7/12 benign, no hyperplasia  ? Procidentia of uterus   ? uses pessary  ? PVC's (premature ventricular contractions)   ? RBBB   ? Rheumatoid arthritis(714.0)   ? Seizure cerebral (HCC)   ? Stroke [facility]) 1996  ? pt estimation  ? Sudden idiopathic hearing loss of right ear 01/18/2018  ? Syncope 11/10/2017  ? Vitamin D deficiency disease   ? ? ?PSH: ?Past Surgical History:  ?Procedure Laterality Date  ? BACK SURGERY    ? CHOLECYSTECTOMY, LAPAROSCOPIC  2002  ? COLONOSCOPY W/ BIOPSIES  6/09  ? sigmoid diverticuli recheck 5 years  ? EYE SURGERY  04/24/14  ? Cataract sx in right eye with lens replacement.  ? LAMINECTOMY AND MICRODISCECTOMY LUMBAR SPINE  01/23/09  ? VAGINAL DELIVERY    ? x5  ? ? ?SH: ?Social History  ? ?Tobacco Use  ? Smoking status: Never  ? Smokeless tobacco: Never  ?Vaping Use  ? Vaping Use: Never used  ?Substance Use Topics  ? Alcohol use: No  ? Drug use: No  ? ?ROS: ?Constitutional:  Negative for  fever, chills, weight loss ?CV: Negative for chest pain ?Respiratory:  Negative for shortness of breath, wheezing, sleep apnea, frequent cough ?GI:  Negative for nausea, vomiting, bloody stool, GERD ? ?PE: ?BP (!) 196/89   Pulse 80   Ht  (1.499 m)   Wt 115 lb (52.2 kg)   LMP 08/14/1981 (Approximate)   BMI 23.23 kg/m?  ?GENERAL APPEARANCE:  Well appearing, well developed, thin NAD ?HEENT:  Atraumatic, normocephalic ?NECK:  Supple. Trachea midline ?ABDOMEN:  Soft, non-tender, no masses ?GU: Foley catheter is intact, but tension is noted due to placement of the leg clip where it is secured.  This is tugging  laterally to the left.  The bulb was deflated indicating only 8 mL of fluid within it.  Good flow is appreciated.  Bulb was reinflated with 15 mL of sterile saline and leg attachment replaced more superiorly on the patient's side to resolve the tension. ?EXTREMITIES:  Moves all extremities well, without clubbing, cyanosis, or edema ?NEUROLOGIC:  Alert and oriented  ?MENTAL STATUS:  appropriate ?SKIN:  Warm, dry, and intact ? ? ?Results: ?Laboratory Data: ?Lab Results  ?Component Value Date  ? WBC 10.8 (H) 03/28/2021  ? HGB 13.0 03/28/2021  ? HCT 38.5 03/28/2021  ? MCV 97.2 03/28/2021  ? PLT 228 03/28/2021  ? ? ?Lab Results  ?Component Value Date  ? CREATININE 0.95 03/28/2021  ? ? ?Lab Results  ?Component Value Date  ? HGBA1C 5.5 02/03/2015  ? ? ?Urinalysis ?   ?Component Value Date/Time  ? COLORURINE YELLOW 03/29/2021 0030  ? APPEARANCEUR TURBID (A) 03/29/2021 0030  ? APPEARANCEUR Clear 04/13/2020 1524  ? LABSPEC 1.008 03/29/2021 0030  ? PHURINE 6.0 03/29/2021 0030  ? GLUCOSEU NEGATIVE 03/29/2021 0030  ? HGBUR SMALL (A) 03/29/2021 0030  ? BILIRUBINUR NEGATIVE 03/29/2021 0030  ? BILIRUBINUR Negative 04/13/2020 1524  ? KETONESUR NEGATIVE 03/29/2021 0030  ? PROTEINUR 100 (A) 03/29/2021 0030  ? UROBILINOGEN negative (A) 01/06/2020 1049  ? UROBILINOGEN 1.0 02/04/2015 0539  ? NITRITE NEGATIVE 03/29/2021 0030  ? LEUKOCYTESUR LARGE (A) 03/29/2021 0030  ? ? ?Lab Results  ?Component Value Date  ? LABMICR See below: 04/13/2020  ? WBCUA >30 (A) 04/13/2020  ? LABEPIT >10 (A) 04/13/2020  ? BACTERIA RARE (A) 03/29/2021  ? ? ?Pertinent Imaging: ? ? ?Results for orders placed during the hospital encounter of 01/13/20 ? ?US RENAL ? ?Narrative ?CLINICAL DATA:  Initial evaluation for hematuria. ? ?EXAM: ?RENAL / URINARY TRACT ULTRASOUND COMPLETE ? ?COMPARISON:  Prior CT from 07/06/2010. ? ?FINDINGS: ?Right Kidney: ? ?Renal measurements: 10.1 x 3.8 x 3.7 cm = volume: 74 mL. ?Echogenicity within normal limits. No nephrolithiasis  or ?hydronephrosis. Prominent extrarenal pelvis noted. No focal renal ?mass. ? ?Left Kidney: ? ?Renal measurements: 10.5 x 3.7 x 3.7 cm = volume: 76.4 mL. ?Echogenicity within normal limits. No nephrolithiasis or ?hydronephrosis. No focal renal mass. ? ?Bladder: ? ?Probable diverticulum seen extending from the superior and right ?aspect of the bladder noted, also partially seen on prior CT from ?2011. Partially distended bladder otherwise within normal limits. ? ?Other: ? ?None. ? ?IMPRESSION: ?1. Normal sonographic evaluation of the kidneys. No nephrolithiasis ?or hydronephrosis. ?2. Bladder diverticulum extending from the superior aspect of the ?bladder, also seen on prior CT from 2011. ? ? ?Electronically Signed ?By: Rise Mu M.D. ?On: 01/14/2020 01:07 ? ?No results found for this or any previous visit. ? ?No results found for this or any previous visit. ? ?No results found for this  or any previous visit. ? ?No results found for this or any previous visit (from the past 24 hour(s)).  ?

## 2021-11-25 ENCOUNTER — Other Ambulatory Visit: Payer: Self-pay | Admitting: Physician Assistant

## 2021-11-27 ENCOUNTER — Other Ambulatory Visit: Payer: Self-pay | Admitting: Physician Assistant

## 2021-11-29 ENCOUNTER — Ambulatory Visit (INDEPENDENT_AMBULATORY_CARE_PROVIDER_SITE_OTHER): Payer: Medicare Other | Admitting: Urology

## 2021-11-29 ENCOUNTER — Other Ambulatory Visit: Payer: Self-pay | Admitting: Physician Assistant

## 2021-11-29 VITALS — BP 151/76 | HR 66 | Ht 59.0 in | Wt 115.0 lb

## 2021-11-29 DIAGNOSIS — Z978 Presence of other specified devices: Secondary | ICD-10-CM | POA: Diagnosis not present

## 2021-11-29 DIAGNOSIS — R339 Retention of urine, unspecified: Secondary | ICD-10-CM | POA: Diagnosis not present

## 2021-11-29 NOTE — Progress Notes (Signed)
History of Present Illness:  ? ?5.25.2021: referred for issues with incomplete bladder emptying. She states that she will now have a strong urge to urinate but find herself unable to void any significant amount of urine even when straining. Of note, she has a hx of bladder prolapse that was managed with pessary but she is no longer using this (she cannot recall why she is no longer using this). She also has a hx of urge incontinence for which she is wearing pads (1 QD). She has limited mobility but is able to navigate her house and make it to the restroom without much issue with assistance of her walker. She has UUI. She has around 2-3 UTI's per year -- these are never symptomatic and are instead incidentally dx'd per UA. Her main concern today is this recent change in her ability to empty her bladder. On average, she finds her sx's quite bothersome (rating them an 8/10). Placed on bethanechol. ?  ?8.31.2021: She feels that bethanechol has alleviated her bladder retention. Pt must wear pads for urinary incontinence. Pt is pleased with her symptoms and submits no questions or concerns at this time. ?  ?8.23.2022: Recent visit to the emergency room on the 15th of this month for probable UTI.  Urine looked infected but culture was not performed.  Catheter was placed, her daughter thinks that 400 cc of urine was obtained upon catheterization.  She did have a CT scan for hematochezia back in June of this year.  Results as follows: ?  ?No acute intra-abdominal pathology identified. No definite ?radiographic explanation for the patient's hematochezia. ?  ?Severe pancolonic diverticulosis without superimposed inflammatory ?change. ?  ?Marked bladder distension with large urachal diverticulum noted. ?This diverticulum has enlarged in size since prior examination and ?may contribute to incomplete voiding and bladder dysfunction. No ?associated mass identified. ?  ?8.30.2022: At her visit last week she was given a voiding  trial.  She came in a couple of days later with a residual urine volume of 600 mL.  Catheter was put back in. ? ?4.18.2023: Here for routine check.  She has had some issues with the catheter kinking and having some leakage from her bladder.  She was most recently in here a couple of weeks ago.  Home health care was called out last week and sent a urine culture.  She has not heard back yet.  The culture was sent although she was not having abdominal pain, fever or the urine was not looking any different. ? ?Past Medical History:  ?Diagnosis Date  ? Anemia   ? Arthritis   ? Cerebral infarction due to unspecified mechanism   ? Cholesterol retinal embolus of both eyes 09/28/2015  ? Chronic diarrhea of unknown origin 12/04/2015  ? Chronic diastolic CHF (congestive heart failure) ([place])   ? CVA (cerebral infarction) 02/02/2015  ? DDD (degenerative disc disease), lumbar   ? Edema   ? Essential tremor 09/29/2015  ? Gait disturbance 06/06/2017  ? Hx-TIA (transient ischemic attack)   ? Hypertension   ? Hypertensive heart disease with heart failure ([place]) 01/13/2016  ? Hypertrophic toenail 09/28/2015  ? Hypothyroidism   ? HYPOTHYROIDISM  ? Impacted cerumen of right ear 01/10/2018  ? Kyphosis (acquired) (postural) 10/07/2016  ? Menopausal state age 86  ? Mitral valve regurgitation   ? TRIVIAL  ? Osteoporosis 09/28/2015  ? Peripheral neuropathy 09/28/2015  ? Post-menopausal bleeding 02/2011  ? Endo Biopsy 7/12 benign, no hyperplasia  ? Procidentia of uterus   ?  uses pessary  ? PVC's (premature ventricular contractions)   ? RBBB   ? Rheumatoid arthritis(714.0)   ? Seizure cerebral ([place])   ? Stroke [facility]) 1996  ? pt estimation  ? Sudden idiopathic hearing loss of right ear 01/18/2018  ? Syncope 11/10/2017  ? Vitamin D deficiency disease   ? ? ?Past Surgical History:  ?Procedure Laterality Date  ? BACK SURGERY    ? CHOLECYSTECTOMY, LAPAROSCOPIC  2002  ? COLONOSCOPY W/ BIOPSIES  6/09  ? sigmoid diverticuli recheck 5 years  ? EYE SURGERY  04/24/14  ?  Cataract sx in right eye with lens replacement.  ? LAMINECTOMY AND MICRODISCECTOMY LUMBAR SPINE  01/23/09  ? VAGINAL DELIVERY    ? x5  ? ? ?Home Medications:  ?(Not in a hospital admission) ? ? ?Allergies:  ?Allergies  ?Allergen Reactions  ? Ace Inhibitors Cough  ? Ciprofloxacin Diarrhea  ? Lidocaine Other (See Comments)  ?  unknown  ? Neosporin [Neomycin-Bacitracin Zn-Polymyx] Other (See Comments)  ?  unknown  ? Sulfa Antibiotics Other (See Comments)  ?  unknown  ? ? ?Family History  ?Problem Relation Age of Onset  ? Hypertension Mother   ? Stroke Mother   ? Hypertension Sister   ? Diabetes Sister   ? Hypertension Sister   ? Diabetes Sister   ? ? ?Social History:  reports that she has never smoked. She has never used smokeless tobacco. She reports that she does not drink alcohol and does not use drugs. ? ?ROS: ?A complete review of systems was performed.  All systems are negative except for pertinent findings as noted. ? ?Physical Exam:  ?Vital signs in last 24 hours: ?@VSRANGES @ ?General:  Alert and oriented, No acute distress ?HEENT: Normocephalic, atraumatic ?Neck: No JVD or lymphadenopathy ?Cardiovascular: Regular rate  ?Lungs: Normal inspiratory/expiratory excursion ?Extremities: No edema ?Neurologic: Grossly intact ? ?I have reviewed prior pt notes ? ?I have reviewed notes from referring/previous physicians ? ?I have reviewed urinalysis results-most recently from about a month ago was indicative of catheter placement ? ? ?Impression/Assessment:  ?Urinary retention due to atonic bladder.  She is doing fairly well with long-term indwelling catheter ? ?Plan:  ?They will continue monthly catheter change ? ?I we will strongly recommend that she not be on antibiotics from her recent culture as I do not think that should have been sent ? ?I will see back in 6 months ? ?Anna Mccoy ?11/29/2021, 8:39 AM  ?Anna Mccoy. Ary Rudnick MD ? ? ?

## 2021-12-29 ENCOUNTER — Ambulatory Visit: Payer: Medicare Other | Admitting: Physician Assistant

## 2021-12-29 ENCOUNTER — Telehealth: Payer: Self-pay

## 2021-12-29 NOTE — Telephone Encounter (Signed)
Patients daughter called advising she needed a clinical member to contact her. She had some questions regarding patient. Patient had an apt today at 1 pm but it was canceled due to car trouble.

## 2021-12-29 NOTE — Telephone Encounter (Signed)
Daughter called to see if the doctor could send in an antibiotic without the patient being seen to give a ua, daughter had car trouble and could not make apt.  Informed her the patient would have to come in and give a urine sample before they could treat her since she is not having symptoms other than urine odor.  Daughter voiced understanding and states she will call tomorrow and schedule an apt if her car is running.

## 2021-12-30 ENCOUNTER — Telehealth: Payer: Self-pay

## 2021-12-30 NOTE — Telephone Encounter (Signed)
Patients daughter called to reschedule appt. Advising that patients urine has a strong odor. I advised her that our PA was out of the office and would consult with you. I advised her that we could schedule her on Monday. Wanted to confirm if patient was ok to wait for appt.

## 2021-12-30 NOTE — Telephone Encounter (Signed)
Spoke to daughter, Dr. Felipa Eth recommended home health do a culture, previous notes that they have done this in the past.  Daughter states home health nurse will be in today for a catheter change she will collect specimen and send to PCP since the office is closer than coming in here in office.  Informed her to call back if she decided she wanted to scheduled an apt with Sharee Pimple.

## 2022-01-24 DIAGNOSIS — E222 Syndrome of inappropriate secretion of antidiuretic hormone: Secondary | ICD-10-CM | POA: Insufficient documentation

## 2022-02-17 ENCOUNTER — Emergency Department (HOSPITAL_COMMUNITY): Payer: Medicare Other

## 2022-02-17 ENCOUNTER — Other Ambulatory Visit: Payer: Self-pay

## 2022-02-17 ENCOUNTER — Inpatient Hospital Stay (HOSPITAL_COMMUNITY)
Admission: EM | Admit: 2022-02-17 | Discharge: 2022-02-20 | DRG: 698 | Disposition: A | Discharge status: Home Health | Payer: Medicare Other | Attending: Family Medicine | Admitting: Family Medicine

## 2022-02-17 ENCOUNTER — Inpatient Hospital Stay (HOSPITAL_COMMUNITY): Payer: Medicare Other

## 2022-02-17 ENCOUNTER — Encounter (HOSPITAL_COMMUNITY): Payer: Self-pay | Admitting: Emergency Medicine

## 2022-02-17 DIAGNOSIS — Z888 Allergy status to other drugs, medicaments and biological substances status: Secondary | ICD-10-CM

## 2022-02-17 DIAGNOSIS — R55 Syncope and collapse: Secondary | ICD-10-CM | POA: Diagnosis present

## 2022-02-17 DIAGNOSIS — Z9009 Acquired absence of other part of head and neck: Secondary | ICD-10-CM | POA: Diagnosis not present

## 2022-02-17 DIAGNOSIS — I110 Hypertensive heart disease with heart failure: Secondary | ICD-10-CM | POA: Diagnosis present

## 2022-02-17 DIAGNOSIS — Z961 Presence of intraocular lens: Secondary | ICD-10-CM | POA: Diagnosis present

## 2022-02-17 DIAGNOSIS — T83518A Infection and inflammatory reaction due to other urinary catheter, initial encounter: Secondary | ICD-10-CM | POA: Diagnosis present

## 2022-02-17 DIAGNOSIS — Z823 Family history of stroke: Secondary | ICD-10-CM

## 2022-02-17 DIAGNOSIS — E039 Hypothyroidism, unspecified: Secondary | ICD-10-CM | POA: Diagnosis present

## 2022-02-17 DIAGNOSIS — M810 Age-related osteoporosis without current pathological fracture: Secondary | ICD-10-CM | POA: Diagnosis present

## 2022-02-17 DIAGNOSIS — H9121 Sudden idiopathic hearing loss, right ear: Secondary | ICD-10-CM | POA: Diagnosis present

## 2022-02-17 DIAGNOSIS — Y846 Urinary catheterization as the cause of abnormal reaction of the patient, or of later complication, without mention of misadventure at the time of the procedure: Secondary | ICD-10-CM | POA: Diagnosis present

## 2022-02-17 DIAGNOSIS — Z79899 Other long term (current) drug therapy: Secondary | ICD-10-CM

## 2022-02-17 DIAGNOSIS — N390 Urinary tract infection, site not specified: Secondary | ICD-10-CM | POA: Diagnosis present

## 2022-02-17 DIAGNOSIS — A4152 Sepsis due to Pseudomonas: Secondary | ICD-10-CM | POA: Diagnosis present

## 2022-02-17 DIAGNOSIS — Z8673 Personal history of transient ischemic attack (TIA), and cerebral infarction without residual deficits: Secondary | ICD-10-CM | POA: Diagnosis not present

## 2022-02-17 DIAGNOSIS — M069 Rheumatoid arthritis, unspecified: Secondary | ICD-10-CM | POA: Diagnosis present

## 2022-02-17 DIAGNOSIS — Z882 Allergy status to sulfonamides status: Secondary | ICD-10-CM

## 2022-02-17 DIAGNOSIS — E559 Vitamin D deficiency, unspecified: Secondary | ICD-10-CM | POA: Diagnosis present

## 2022-02-17 DIAGNOSIS — E876 Hypokalemia: Secondary | ICD-10-CM | POA: Diagnosis not present

## 2022-02-17 DIAGNOSIS — I5032 Chronic diastolic (congestive) heart failure: Secondary | ICD-10-CM | POA: Diagnosis present

## 2022-02-17 DIAGNOSIS — I4510 Unspecified right bundle-branch block: Secondary | ICD-10-CM | POA: Diagnosis present

## 2022-02-17 DIAGNOSIS — Z79890 Hormone replacement therapy (postmenopausal): Secondary | ICD-10-CM | POA: Diagnosis not present

## 2022-02-17 DIAGNOSIS — Z881 Allergy status to other antibiotic agents status: Secondary | ICD-10-CM | POA: Diagnosis not present

## 2022-02-17 DIAGNOSIS — Z66 Do not resuscitate: Secondary | ICD-10-CM | POA: Diagnosis present

## 2022-02-17 DIAGNOSIS — Z7982 Long term (current) use of aspirin: Secondary | ICD-10-CM

## 2022-02-17 DIAGNOSIS — R531 Weakness: Secondary | ICD-10-CM

## 2022-02-17 DIAGNOSIS — E785 Hyperlipidemia, unspecified: Secondary | ICD-10-CM | POA: Diagnosis present

## 2022-02-17 DIAGNOSIS — Z8249 Family history of ischemic heart disease and other diseases of the circulatory system: Secondary | ICD-10-CM | POA: Diagnosis not present

## 2022-02-17 LAB — COMPREHENSIVE METABOLIC PANEL
ALT: 14 U/L (ref 0–44)
AST: 22 U/L (ref 15–41)
Albumin: 2.6 g/dL — ABNORMAL LOW (ref 3.5–5.0)
Alkaline Phosphatase: 55 U/L (ref 38–126)
Anion gap: 13 (ref 5–15)
BUN: 10 mg/dL (ref 8–23)
CO2: 22 mmol/L (ref 22–32)
Calcium: 8.6 mg/dL — ABNORMAL LOW (ref 8.9–10.3)
Chloride: 100 mmol/L (ref 98–111)
Creatinine, Ser: 0.92 mg/dL (ref 0.44–1.00)
GFR, Estimated: 58 mL/min — ABNORMAL LOW (ref >60)
Glucose, Bld: 101 mg/dL — ABNORMAL HIGH (ref 70–99)
Potassium: 3.7 mmol/L (ref 3.5–5.1)
Sodium: 135 mmol/L (ref 135–145)
Total Bilirubin: 0.3 mg/dL (ref 0.3–1.2)
Total Protein: 5.9 g/dL — ABNORMAL LOW (ref 6.5–8.1)

## 2022-02-17 LAB — CBC WITH DIFFERENTIAL/PLATELET
Abs Immature Granulocytes: 0.1 10*3/uL — ABNORMAL HIGH (ref 0.00–0.07)
Basophils Absolute: 0.1 10*3/uL (ref 0.0–0.1)
Basophils Relative: 0 %
Eosinophils Absolute: 0.1 10*3/uL (ref 0.0–0.5)
Eosinophils Relative: 1 %
HCT: 33.6 % — ABNORMAL LOW (ref 36.0–46.0)
Hemoglobin: 11.1 g/dL — ABNORMAL LOW (ref 12.0–15.0)
Immature Granulocytes: 1 %
Lymphocytes Relative: 21 %
Lymphs Abs: 3.4 10*3/uL (ref 0.7–4.0)
MCH: 32.2 pg (ref 26.0–34.0)
MCHC: 33 g/dL (ref 30.0–36.0)
MCV: 97.4 fL (ref 80.0–100.0)
Monocytes Absolute: 2.2 10*3/uL — ABNORMAL HIGH (ref 0.1–1.0)
Monocytes Relative: 13 %
Neutro Abs: 10.6 10*3/uL — ABNORMAL HIGH (ref 1.7–7.7)
Neutrophils Relative %: 64 %
Platelets: 201 10*3/uL (ref 150–400)
RBC: 3.45 MIL/uL — ABNORMAL LOW (ref 3.87–5.11)
RDW: 15.8 % — ABNORMAL HIGH (ref 11.5–15.5)
WBC: 16.3 10*3/uL — ABNORMAL HIGH (ref 4.0–10.5)
nRBC: 0 % (ref 0.0–0.2)

## 2022-02-17 LAB — LACTIC ACID, PLASMA
Lactic Acid, Venous: 2.5 mmol/L (ref 0.5–1.9)
Lactic Acid, Venous: 1.6 mmol/L (ref 0.5–1.9)

## 2022-02-17 LAB — CBC
HCT: 32.5 % — ABNORMAL LOW (ref 36.0–46.0)
Hemoglobin: 10.4 g/dL — ABNORMAL LOW (ref 12.0–15.0)
MCH: 31.6 pg (ref 26.0–34.0)
MCHC: 32 g/dL (ref 30.0–36.0)
MCV: 98.8 fL (ref 80.0–100.0)
Platelets: 189 10*3/uL (ref 150–400)
RBC: 3.29 MIL/uL — ABNORMAL LOW (ref 3.87–5.11)
RDW: 15.6 % — ABNORMAL HIGH (ref 11.5–15.5)
WBC: 16.3 10*3/uL — ABNORMAL HIGH (ref 4.0–10.5)
nRBC: 0 % (ref 0.0–0.2)

## 2022-02-17 LAB — TSH: TSH: 0.996 u[IU]/mL (ref 0.350–4.500)

## 2022-02-17 LAB — URINALYSIS, ROUTINE W REFLEX MICROSCOPIC
Bilirubin Urine: NEGATIVE
Glucose, UA: NEGATIVE mg/dL
Ketones, ur: NEGATIVE mg/dL
Nitrite: NEGATIVE
Protein, ur: 100 mg/dL — AB
RBC / HPF: >50 RBC/hpf — ABNORMAL HIGH (ref 0–5)
Specific Gravity, Urine: 1.01 (ref 1.005–1.030)
WBC, UA: >50 WBC/hpf — ABNORMAL HIGH (ref 0–5)
pH: 7 (ref 5.0–8.0)

## 2022-02-17 LAB — VALPROIC ACID LEVEL: Valproic Acid Lvl: 33 ug/mL — ABNORMAL LOW (ref 50.0–100.0)

## 2022-02-17 LAB — CREATININE, SERUM
Creatinine, Ser: 0.82 mg/dL (ref 0.44–1.00)
GFR, Estimated: >60 mL/min (ref >60)

## 2022-02-17 LAB — CK: Total CK: 31 U/L — ABNORMAL LOW (ref 38–234)

## 2022-02-17 LAB — CBG MONITORING, ED: Glucose-Capillary: 100 mg/dL — ABNORMAL HIGH (ref 70–99)

## 2022-02-17 MED ORDER — ONDANSETRON HCL 4 MG PO TABS: 4.0000 mg | ORAL_TABLET | Freq: Four times a day (QID) | ORAL | Status: DC | PRN

## 2022-02-17 MED ORDER — PRIMIDONE 50 MG PO TABS
50.0000 mg | ORAL_TABLET | Freq: Every day | ORAL | Status: DC
  Administered 2022-02-18 – 2022-02-20 (×3): 50 mg via ORAL
  Filled 2022-02-17 (×3): qty 1

## 2022-02-17 MED ORDER — ASPIRIN 81 MG PO TBEC
81.0000 mg | DELAYED_RELEASE_TABLET | Freq: Every day | ORAL | Status: DC
  Administered 2022-02-18 – 2022-02-20 (×3): 81 mg via ORAL
  Filled 2022-02-17 (×3): qty 1

## 2022-02-17 MED ORDER — ATORVASTATIN CALCIUM 10 MG PO TABS
20.0000 mg | ORAL_TABLET | Freq: Every evening | ORAL | Status: DC
  Administered 2022-02-17 – 2022-02-19 (×3): 20 mg via ORAL
  Filled 2022-02-17 (×3): qty 2

## 2022-02-17 MED ORDER — DIVALPROEX SODIUM 250 MG PO DR TAB
250.0000 mg | DELAYED_RELEASE_TABLET | Freq: Two times a day (BID) | ORAL | Status: DC
  Administered 2022-02-17 – 2022-02-20 (×6): 250 mg via ORAL
  Filled 2022-02-17 (×6): qty 1

## 2022-02-17 MED ORDER — ENOXAPARIN SODIUM 30 MG/0.3ML IJ SOSY
30.0000 mg | PREFILLED_SYRINGE | INTRAMUSCULAR | Status: DC
  Administered 2022-02-17 – 2022-02-19 (×3): 30 mg via SUBCUTANEOUS
  Filled 2022-02-17 (×2): qty 0.3

## 2022-02-17 MED ORDER — ACETAMINOPHEN 650 MG RE SUPP: 650.0000 mg | Freq: Four times a day (QID) | RECTAL | Status: DC | PRN

## 2022-02-17 MED ORDER — SODIUM CHLORIDE 0.9 % IV SOLN
1.0000 g | INTRAVENOUS | Status: DC
  Administered 2022-02-18: 1 g via INTRAVENOUS
  Filled 2022-02-17: qty 10

## 2022-02-17 MED ORDER — ONDANSETRON HCL 4 MG/2ML IJ SOLN: 4.0000 mg | Freq: Four times a day (QID) | INTRAMUSCULAR | Status: DC | PRN

## 2022-02-17 MED ORDER — ACETAMINOPHEN 325 MG PO TABS: 650.0000 mg | ORAL_TABLET | Freq: Four times a day (QID) | ORAL | Status: DC | PRN

## 2022-02-17 MED ORDER — LEFLUNOMIDE 20 MG PO TABS: 20.0000 mg | ORAL_TABLET | ORAL | Status: DC

## 2022-02-17 MED ORDER — SODIUM CHLORIDE 0.9 % IV SOLN: INTRAVENOUS | Status: AC

## 2022-02-17 MED ORDER — IOHEXOL 350 MG/ML SOLN
60.0000 mL | Freq: Once | INTRAVENOUS | Status: AC | PRN
  Administered 2022-02-17: 60 mL via INTRAVENOUS

## 2022-02-17 MED ORDER — SODIUM CHLORIDE 0.9 % IV SOLN
1.0000 g | Freq: Once | INTRAVENOUS | Status: AC
  Administered 2022-02-17: 1 g via INTRAVENOUS
  Filled 2022-02-17: qty 10

## 2022-02-17 MED ORDER — LACTATED RINGERS IV BOLUS
500.0000 mL | Freq: Once | INTRAVENOUS | Status: AC
  Administered 2022-02-17: 500 mL via INTRAVENOUS

## 2022-02-17 MED ORDER — SODIUM CHLORIDE 0.9 % IV SOLN: INTRAVENOUS | Status: DC

## 2022-02-17 NOTE — ED Notes (Signed)
Patient transported to CT 

## 2022-02-17 NOTE — ED Triage Notes (Signed)
Patient arrives from home via [facility] with chief complaint of AMS. Per family, patient has been slower to respond, not acting her normal self. Patient however is Aox3 at this time. When EMS arrived patient was actually seated on toilet, not having a bowel movement, but was noted to become unresponsive to verbal stimuli, started drooling at the mouth and became bradycardic w/ a HR in the 40s. Patient noted to initially be hypotensive 86/40 but given 400 ml NS and had improvement of 127/80.  CBG-125 SpO2-95% HR-74

## 2022-02-17 NOTE — ED Notes (Signed)
ED TO INPATIENT HANDOFF REPORT  ED Nurse Name and Phone #:  Les Pou RN [phone]  S Name/Age/Gender Anna Mccoy 88 y.o. female Room/Bed: 025C/025C  Code Status   Code Status: DNR  Home   Chief Complaint Syncope [R55]  Triage Note Patient arrives from home via [facility] with chief complaint of AMS. Per family, patient has been slower to respond, not acting her normal self. Patient however is Aox3 at this time. When EMS arrived patient was actually seated on toilet, not having a bowel movement, but was noted to become unresponsive to verbal stimuli, started drooling at the mouth and became bradycardic w/ a HR in the 40s. Patient noted to initially be hypotensive 86/40 but given 400 ml NS and had improvement of 127/80.  CBG-125 SpO2-95% HR-74      Allergies Allergies  Allergen Reactions   Ace Inhibitors Cough   Bacitracin-Polymyxin B     Other reaction(s): Unknown   Ciprofloxacin Diarrhea   Lidocaine Other (See Comments)    unknown   Neosporin [Neomycin-Bacitracin Zn-Polymyx] Other (See Comments)    unknown   Sulfa Antibiotics Other (See Comments)    unknown    Level of Care/Admitting Diagnosis ED Disposition     ED Disposition  Admit   Condition  --   Comment  Hospital Area: [facility] [[id number]]  Level of Care: Telemetry Medical [104]  May admit patient to [facility] or [facility] if equivalent level of care is available:: Yes  Covid Evaluation: Asymptomatic - no recent exposure (last 10 days) testing not required  Diagnosis: Syncope [206001]  Admitting Physician: Lovie Chol  Attending Physician: Meredeth Ide [[id number]]  Certification:: I certify this patient will need inpatient services for at least 2 midnights  Estimated Length of Stay: 3          B Medical/Surgery History Past Medical History:  Diagnosis Date   Anemia    Arthritis    Cerebral infarction due to unspecified mechanism    Cholesterol retinal embolus  of both eyes 09/28/2015   Chronic diarrhea of unknown origin 12/04/2015   Chronic diastolic CHF (congestive heart failure) ([facility])    CVA (cerebral infarction) 02/02/2015   DDD (degenerative disc disease), lumbar    Edema    Essential tremor 09/29/2015   Gait disturbance 06/06/2017   Hx-TIA (transient ischemic attack)    Hypertension    Hypertensive heart disease with heart failure ([facility]) 01/13/2016   Hypertrophic toenail 09/28/2015   Hypothyroidism    HYPOTHYROIDISM   Impacted cerumen of right ear 01/10/2018   Kyphosis (acquired) (postural) 10/07/2016   Menopausal state age 74   Mitral valve regurgitation    TRIVIAL   Osteoporosis 09/28/2015   Peripheral neuropathy 09/28/2015   Post-menopausal bleeding 02/2011   Endo Biopsy 7/12 benign, no hyperplasia   Procidentia of uterus    uses pessary   PVC's (premature ventricular contractions)    RBBB    Rheumatoid arthritis(714.0)    Seizure cerebral ([facility])    Stroke ([facility]) 1996   pt estimation   Sudden idiopathic hearing loss of right ear 01/18/2018   Syncope 11/10/2017   Vitamin D deficiency disease    Past Surgical History:  Procedure Laterality Date   BACK SURGERY     CHOLECYSTECTOMY, LAPAROSCOPIC  2002   COLONOSCOPY W/ BIOPSIES  6/09   sigmoid diverticuli recheck 5 years   EYE SURGERY  04/24/14   Cataract sx in right eye with lens replacement.  LAMINECTOMY AND MICRODISCECTOMY LUMBAR SPINE  01/23/09   VAGINAL DELIVERY     x5     A IV Location/Drains/Wounds Patient Lines/Drains/Airways Status     Active Line/Drains/Airways     Name Placement date Placement time Site Days   Peripheral IV 02/17/22 20 G Left Antecubital 02/17/22  1259  Antecubital  less than 1   Urethral Catheter Nettie Elm NT Straight-tip 16 Fr. 03/29/21  0025  Straight-tip  325            Intake/Output Last 24 hours  Intake/Output Summary (Last 24 hours) at 02/17/2022 1923 Last data filed at 02/17/2022 1522 Gross per 24 hour  Intake 600.32 ml  Output --  Net  600.32 ml    Labs/Imaging Results for orders placed or performed during the hospital encounter of 02/17/22 (from the past 48 hour(s))  Urinalysis, Routine w reflex microscopic Urine, Clean Catch     Status: Abnormal   Collection Time: 02/17/22  1:04 PM  Result Value Ref Range   Color, Urine YELLOW YELLOW   APPearance TURBID (A) CLEAR   Specific Gravity, Urine 1.010 1.005 - 1.030   pH 7.0 5.0 - 8.0   Glucose, UA NEGATIVE NEGATIVE mg/dL   Hgb urine dipstick MODERATE (A) NEGATIVE   Bilirubin Urine NEGATIVE NEGATIVE   Ketones, ur NEGATIVE NEGATIVE mg/dL   Protein, ur 161 (A) NEGATIVE mg/dL   Nitrite NEGATIVE NEGATIVE   Leukocytes,Ua LARGE (A) NEGATIVE   RBC / HPF >50 (H) 0 - 5 RBC/hpf   WBC, UA >50 (H) 0 - 5 WBC/hpf   Bacteria, UA MANY (A) NONE SEEN   Squamous Epithelial / LPF 0-5 0 - 5   WBC Clumps PRESENT     Comment: Performed at [facility], [address], [place], [place] [address]  CBC with Differential     Status: Abnormal   Collection Time: 02/17/22  1:06 PM  Result Value Ref Range   WBC 16.3 (H) 4.0 - 10.5 K/uL   RBC 3.45 (L) 3.87 - 5.11 MIL/uL   Hemoglobin 11.1 (L) 12.0 - 15.0 g/dL   HCT 54.0 (L) 98.1 - 19.1 %   MCV 97.4 80.0 - 100.0 fL   MCH 32.2 26.0 - 34.0 pg   MCHC 33.0 30.0 - 36.0 g/dL   RDW 47.8 (H) 29.5 - 62.1 %   Platelets 201 150 - 400 K/uL   nRBC 0.0 0.0 - 0.2 %   Neutrophils Relative % 64 %   Neutro Abs 10.6 (H) 1.7 - 7.7 K/uL   Lymphocytes Relative 21 %   Lymphs Abs 3.4 0.7 - 4.0 K/uL   Monocytes Relative 13 %   Monocytes Absolute 2.2 (H) 0.1 - 1.0 K/uL   Eosinophils Relative 1 %   Eosinophils Absolute 0.1 0.0 - 0.5 K/uL   Basophils Relative 0 %   Basophils Absolute 0.1 0.0 - 0.1 K/uL   Immature Granulocytes 1 %   Abs Immature Granulocytes 0.10 (H) 0.00 - 0.07 K/uL    Comment: Performed at [facility], [address] [place], [place] [address]  Comprehensive metabolic panel     Status: Abnormal   Collection Time: 02/17/22  1:06 PM   Result Value Ref Range   Sodium 135 135 - 145 mmol/L   Potassium 3.7 3.5 - 5.1 mmol/L   Chloride 100 98 - 111 mmol/L   CO2 22 22 - 32 mmol/L   Glucose, Bld 101 (H) 70 - 99 mg/dL    Comment: Glucose  reference range applies only to samples taken after fasting for at least 8 hours.   BUN 10 8 - 23 mg/dL   Creatinine, Ser 9.56 0.44 - 1.00 mg/dL   Calcium 8.6 (L) 8.9 - 10.3 mg/dL   Total Protein 5.9 (L) 6.5 - 8.1 g/dL   Albumin 2.6 (L) 3.5 - 5.0 g/dL   AST 22 15 - 41 U/L   ALT 14 0 - 44 U/L   Alkaline Phosphatase 55 38 - 126 U/L   Total Bilirubin 0.3 0.3 - 1.2 mg/dL   GFR, Estimated 58 (L) >60 mL/min    Comment: (NOTE) Calculated using the CKD-EPI Creatinine Equation (2021)    Anion gap 13 5 - 15    Comment: Performed at [facility], [address] [place], [place] [address]  CK     Status: Abnormal   Collection Time: 02/17/22  1:06 PM  Result Value Ref Range   Total CK 31 (L) 38 - 234 U/L    Comment: Performed at [facility], [address], [place], [place] [address]  TSH     Status: None   Collection Time: 02/17/22  1:06 PM  Result Value Ref Range   TSH 0.996 0.350 - 4.500 uIU/mL    Comment: Performed by a 3rd Generation assay with a functional sensitivity of <=0.01 uIU/mL. Performed at [facility], [address] [place], [place] [address]   Lactic acid, plasma     Status: Abnormal   Collection Time: 02/17/22  1:06 PM  Result Value Ref Range   Lactic Acid, Venous 2.5 (HH) 0.5 - 1.9 mmol/L    Comment: CRITICAL RESULT CALLED TO, READ BACK BY AND VERIFIED WITH: Gwenith Spitz, RN 1416 02/17/22 L. KLAR Performed at [facility], [address] [place], [place] [address]   Valproic acid level     Status: Abnormal   Collection Time: 02/17/22  1:31 PM  Result Value Ref Range   Valproic Acid Lvl 33 (L) 50.0 - 100.0 ug/mL    Comment: Performed at [facility], [address] [place], [place] [address]  CBG monitoring, ED     Status: Abnormal    Collection Time: 02/17/22  1:32 PM  Result Value Ref Range   Glucose-Capillary 100 (H) 70 - 99 mg/dL    Comment: Glucose reference range applies only to samples taken after fasting for at least 8 hours.  Lactic acid, plasma     Status: None   Collection Time: 02/17/22  3:57 PM  Result Value Ref Range   Lactic Acid, Venous 1.6 0.5 - 1.9 mmol/L    Comment: Performed at [facility], [address] [place], [place] [address]  CBC     Status: Abnormal   Collection Time: 02/17/22  3:57 PM  Result Value Ref Range   WBC 16.3 (H) 4.0 - 10.5 K/uL   RBC 3.29 (L) 3.87 - 5.11 MIL/uL   Hemoglobin 10.4 (L) 12.0 - 15.0 g/dL   HCT 53.6 (L) 64.4 - 03.4 %   MCV 98.8 80.0 - 100.0 fL   MCH 31.6 26.0 - 34.0 pg   MCHC 32.0 30.0 - 36.0 g/dL   RDW 74.2 (H) 59.5 - 63.8 %   Platelets 189 150 - 400 K/uL   nRBC 0.0 0.0 - 0.2 %    Comment: Performed at [facility], [address], [place], [place] [address]  Creatinine, serum     Status:  None   Collection Time: 02/17/22  3:57 PM  Result Value Ref Range   Creatinine, Ser 0.82 0.44 - 1.00 mg/dL   GFR, Estimated >78 >29 mL/min    Comment: (NOTE) Calculated using the CKD-EPI Creatinine Equation (2021) Performed at [facility], [address], [place], [place] [address]    CT Angio Chest PE W and/or Wo Contrast  Result Date: 02/17/2022 CLINICAL DATA:  PE suspected, elevated D-dimer EXAM: CT ANGIOGRAPHY CHEST WITH CONTRAST TECHNIQUE: Multidetector CT imaging of the chest was performed using the standard protocol during bolus administration of intravenous contrast. Multiplanar CT image reconstructions and MIPs were obtained to evaluate the vascular anatomy. RADIATION DOSE REDUCTION: This exam was performed according to the departmental dose-optimization program which includes automated exposure control, adjustment of the mA and/or kV according to patient size and/or use of iterative reconstruction technique. CONTRAST:  60mL OMNIPAQUE IOHEXOL 350  MG/ML SOLN COMPARISON:  05/30/2020 FINDINGS: Cardiovascular: Satisfactory opacification of the pulmonary arteries to the segmental level. No evidence of pulmonary embolism. Normal heart size. Left coronary artery calcifications. No pericardial effusion. Aortic atherosclerosis. Mediastinum/Nodes: No enlarged mediastinal, hilar, or axillary lymph nodes. Thyroid gland, trachea, and esophagus demonstrate no significant findings. Lungs/Pleura: Scarring and or atelectasis of the bilateral lung bases, similar compared to prior examination (series 7, image 87). No pleural effusion or pneumothorax. Upper Abdomen: No acute abnormality.  Colonic diverticulosis. Musculoskeletal: No chest wall abnormality. No acute osseous findings. Review of the MIP images confirms the above findings. IMPRESSION: 1. Negative examination for pulmonary embolism. 2. Scarring and or atelectasis of the bilateral lung bases, similar compared to prior examination. No acute appearing airspace opacity. 3. Coronary artery disease. Aortic Atherosclerosis (ICD10-I70.0). Electronically Signed   By: Jearld Lesch M.D.   On: 02/17/2022 16:26   CT Head Wo Contrast  Result Date: 02/17/2022 CLINICAL DATA:  Mental status change. EXAM: CT HEAD WITHOUT CONTRAST TECHNIQUE: Contiguous axial images were obtained from the base of the skull through the vertex without intravenous contrast. RADIATION DOSE REDUCTION: This exam was performed according to the departmental dose-optimization program which includes automated exposure control, adjustment of the mA and/or kV according to patient size and/or use of iterative reconstruction technique. COMPARISON:  02/04/2015 MRI.  Head CT of 02/01/2015. FINDINGS: Brain: Advanced cerebral and cerebellar atrophy. Moderate low density in the periventricular white matter likely related to small vessel disease. No mass lesion, hemorrhage, hydrocephalus, acute infarct, intra-axial, or extra-axial fluid collection. Vascular: No  hyperdense vessel or unexpected calcification. Skull: Normal Sinuses/Orbits: Surgical changes in both globes. Right maxillary sinus near-complete opacification by dominant mucous retention cyst or polyp. Right-sided mastoid effusion. Fluid in the right middle ear including on 25/4. Other: None. IMPRESSION: 1. No acute intracranial abnormality. 2. Cerebral atrophy and small vessel ischemic change. 3. Right mastoid and middle ear effusion. 4. Sinus disease. Electronically Signed   By: Jeronimo Greaves M.D.   On: 02/17/2022 14:12   DG Chest Portable 1 View  Result Date: 02/17/2022 CLINICAL DATA:  Unresponsive, bradycardia EXAM: PORTABLE CHEST 1 VIEW COMPARISON:  None Available. FINDINGS: Normal mediastinum and cardiac silhouette. Normal pulmonary vasculature. No evidence of effusion, infiltrate, or pneumothorax. No acute bony abnormality. IMPRESSION: No acute cardiopulmonary process. Electronically Signed   By: Genevive Bi M.D.   On: 02/17/2022 13:31    Pending Labs Unresulted Labs (From admission, onward)     Start     Ordered   02/24/22 0500  Creatinine, serum  (enoxaparin (LOVENOX)    CrCl >/=  30 ml/min)  Weekly,   R     Comments: while on enoxaparin therapy    02/17/22 1551   02/18/22 0500  CBC  Tomorrow morning,   R        02/17/22 1551   02/18/22 0500  Comprehensive metabolic panel  Tomorrow morning,   R        02/17/22 1551   02/17/22 1306  Urine Culture  Once,   URGENT       Question:  Indication  Answer:  Dysuria   02/17/22 1306            Vitals/Pain Today's Vitals   02/17/22 1730 02/17/22 1800 02/17/22 1830 02/17/22 1835  BP: (!) 143/62 129/67 (!) 151/77   Pulse: 77 71 75 72  Resp: (!) 27 (!) 27 (!) 28 (!) 22  Temp:      TempSrc:      SpO2: 98% 98% 96% 97%  Weight:      Height:      PainSc:        Isolation Precautions No active isolations  Medications Medications  0.9 %  sodium chloride infusion ( Intravenous New Bag/Given 02/17/22 1743)  aspirin EC tablet 81 mg  (has no administration in time range)  leflunomide (ARAVA) tablet 20 mg (has no administration in time range)  atorvastatin (LIPITOR) tablet 20 mg (has no administration in time range)  divalproex (DEPAKOTE) DR tablet 250 mg (has no administration in time range)  primidone (MYSOLINE) tablet 50 mg (50 mg Oral Not Given 02/17/22 1843)  enoxaparin (LOVENOX) injection 30 mg (has no administration in time range)  acetaminophen (TYLENOL) tablet 650 mg (has no administration in time range)    Or  acetaminophen (TYLENOL) suppository 650 mg (has no administration in time range)  ondansetron (ZOFRAN) tablet 4 mg (has no administration in time range)    Or  ondansetron (ZOFRAN) injection 4 mg (has no administration in time range)  cefTRIAXone (ROCEPHIN) 1 g in sodium chloride 0.9 % 100 mL IVPB (has no administration in time range)  lactated ringers bolus 500 mL (0 mLs Intravenous Stopped 02/17/22 1414)  cefTRIAXone (ROCEPHIN) 1 g in sodium chloride 0.9 % 100 mL IVPB (0 g Intravenous Stopped 02/17/22 1522)  iohexol (OMNIPAQUE) 350 MG/ML injection 60 mL (60 mLs Intravenous Contrast Given 02/17/22 1617)    Mobility: Wheelchair      R Recommendations: See Admitting Provider Note

## 2022-02-17 NOTE — ED Notes (Addendum)
Attempted to call report. ED RN advised that they will change room /nurse.

## 2022-02-17 NOTE — ED Provider Notes (Signed)
[facility] EMERGENCY DEPARTMENT Provider Note   CSN: [id number] Arrival date & time: 02/17/22  1244     History  Chief Complaint  Patient presents with   Altered Mental Status    Anna Mccoy is a 88 y.o. female.  Patient with a history of hypertension, rheumatoid arthritis, TIA, hypothyroidism, previous stroke presenting with episode of generalized weakness.  Patient states she got up this morning feeling relatively okay but became generally weak after having a shower.  Her aide helped her to the toilet while she was attempting to have a bowel movement.  She was noted to be too weak to get off the toilet and assistance was called.  EMS reports she was initially unresponsive to verbal stimulation while on the toilet and drooling at the mouth with bradycardia in the 40s.  Hypotensive at 86/40.  She improved with verbal stimulation IV fluids blood pressure 127/80.  Patient states she recalls this and does not feel dizzy or lightheaded.  Did not lose consciousness or fall.  States she felt generally weak and was not able to get up.  Daughter at bedside denies loss of consciousness and denies falling. Patient states she feels weak all over but has not had a fever, cough, nausea, vomiting, diarrhea, chest pain or shortness of breath.  Does have indwelling Foley catheter with cloudy urine.  Daughter reports she has not had her iron for about the past 1 weeks due to prescribing issues.  The history is provided by the patient, the EMS personnel and a relative.  Altered Mental Status Associated symptoms: weakness   Associated symptoms: no abdominal pain, no light-headedness, no nausea, no rash and no vomiting        Home Medications Prior to Admission medications   Medication Sig Start Date End Date Taking? Authorizing Provider  acetaminophen (TYLENOL) 500 MG tablet Take 500 mg by mouth every 6 (six) hours as needed for moderate pain.    [provider]   amLODipine (NORVASC) 5 MG tablet Take 5 mg by mouth daily.    [provider]  aspirin 81 MG tablet Take 81 mg by mouth daily.     [provider]  atorvastatin (LIPITOR) 20 MG tablet Take 20 mg by mouth every evening.     [provider]  bethanechol (URECHOLINE) 10 MG tablet Take 1 tablet (10 mg total) by mouth 2 (two) times daily. Patient not taking: Reported on 11/29/2021 05/21/20   Marcine Matar, MD  carvedilol (COREG) 6.25 MG tablet Take 6.25 mg by mouth 2 (two) times daily. 11/18/21   [provider]  divalproex (DEPAKOTE) 250 MG DR tablet Take 1 tablet (250 mg total) by mouth every 12 (twelve) hours. 02/05/15   Rhetta Mura, MD  Docusate Sodium (COLACE PO) Take 1 tablet by mouth 2 (two) times daily.    [provider]  Fluocinolone Acetonide Body 0.01 % OIL Apply topically daily. 09/01/21   [provider]  Fluocinolone Acetonide Scalp 0.01 % OIL Apply topically daily as needed. 10/31/21   [provider]  fluticasone (FLONASE) 50 MCG/ACT nasal spray Place 1 spray into both nostrils daily.  01/14/18   [provider]  folic acid (FOLVITE) 1 MG tablet Take 1 mg by mouth daily.    [provider]  furosemide (LASIX) 20 MG tablet Take 20 mg by mouth daily. 10/20/16   [provider]  hydrocortisone (ANUSOL-HC) 25 MG suppository Place 1 suppository (25 mg total) rectally 2 (two)  times daily as needed for hemorrhoids or anal itching. Patient not taking: Reported on 11/29/2021 01/21/21   Long, Arlyss Repress, MD  hypromellose (GENTEAL) 0.3 % GEL ophthalmic ointment Place 1 application into both eyes at bedtime.    [provider]  leflunomide (ARAVA) 20 MG tablet Take 20 mg by mouth as directed. 03/03/20   [provider]  levothyroxine (SYNTHROID) 100 MCG tablet Take 100 mcg by mouth daily. 09/29/21   [provider]  levothyroxine (SYNTHROID) 88 MCG tablet Take 88 mcg by mouth daily  before breakfast.  12/06/19   [provider]  Multiple Vitamins-Minerals (PRESERVISION AREDS PO) Take 1 capsule by mouth daily.    [provider]  nitrofurantoin (MACRODANTIN) 100 MG capsule Take 100 mg by mouth 2 (two) times daily. Start date : 05/28/20 Patient not taking: Reported on 11/29/2021    [provider]  olmesartan (BENICAR) 40 MG tablet Take 40 mg by mouth daily. 11/18/21   [provider]  olmesartan-hydrochlorothiazide (BENICAR HCT) 40-25 MG tablet Take 1 tablet by mouth daily. 11/05/17   [provider]  oxybutynin (DITROPAN) 5 MG tablet Take 1 tablet (5 mg total) by mouth every 8 (eight) hours as needed for bladder spasms (and foley leakage). 11/23/21   Summerlin, Ofilia Neas Annette, PA-C  POLY-IRON 150 150 MG capsule Take 150 mg by mouth daily.  10/13/15   [provider]  potassium chloride SA (K-DUR,KLOR-CON) 20 MEQ tablet Take 20 mEq by mouth 2 (two) times daily. Patient not taking: Reported on 11/29/2021    [provider]  primidone (MYSOLINE) 50 MG tablet Take 1 tablet by mouth daily.  09/20/16   [provider]  REFRESH OPTIVE ADVANCED 0.5-1-0.5 % SOLN Apply 1 drop to eye in the morning, at noon, and at bedtime.  12/23/19   [provider]  TOBRADEX ophthalmic ointment SMARTSIG:1 sparingly In Eye(s) Every Evening 11/22/21   [provider]  triamcinolone cream (KENALOG) 0.1 % Apply 1 application topically 2 (two) times daily as needed (itching).  02/07/20   [provider]  UNABLE TO FIND Take 1 capsule by mouth daily. Ultra flora     [provider]      Allergies    Ace inhibitors, Ciprofloxacin, Lidocaine, Neosporin [neomycin-bacitracin zn-polymyx], and Sulfa antibiotics    Review of Systems   Review of Systems  Constitutional:  Positive for activity change, appetite change and fatigue.  HENT:  Negative for congestion and rhinorrhea.   Respiratory:  Negative for cough,  chest tightness and shortness of breath.   Cardiovascular:  Negative for chest pain.  Gastrointestinal:  Negative for abdominal pain, nausea and vomiting.  Genitourinary:  Negative for dysuria.  Musculoskeletal:  Negative for arthralgias and myalgias.  Skin:  Negative for rash.  Neurological:  Positive for weakness. Negative for dizziness, syncope and light-headedness.   all other systems are negative except as noted in the HPI and PMH.    Physical Exam Updated Vital Signs BP 125/67 (BP Location: Right Arm)   Pulse 80   Temp 98.4 F (36.9 C) (Oral)   Resp (!) 35   Ht  (1.499 m)   Wt 52 kg   LMP 08/14/1981 (Approximate)   SpO2 96%   BMI 23.15 kg/m  Physical Exam Vitals and nursing note reviewed.  Constitutional:      General: She is not in acute distress.    Appearance: She is well-developed.     Comments: Pale appearing  HENT:  Head: Normocephalic and atraumatic.     Mouth/Throat:     Pharynx: No oropharyngeal exudate.  Eyes:     Conjunctiva/sclera: Conjunctivae normal.     Pupils: Pupils are equal, round, and reactive to light.  Neck:     Comments: No meningismus. Cardiovascular:     Rate and Rhythm: Normal rate and regular rhythm.     Heart sounds: Murmur heard.  Pulmonary:     Effort: Pulmonary effort is normal. No respiratory distress.     Breath sounds: Normal breath sounds.  Abdominal:     Palpations: Abdomen is soft.     Tenderness: There is no abdominal tenderness. There is no guarding or rebound.  Genitourinary:    Comments: Foley catheter in place and cloudy urine with sediment. Musculoskeletal:        General: No tenderness. Normal range of motion.     Cervical back: Normal range of motion and neck supple.  Skin:    General: Skin is warm.  Neurological:     Mental Status: She is alert and oriented to person, place, and time.     Cranial Nerves: No cranial nerve deficit.     Motor: No abnormal muscle tone.     Coordination: Coordination  normal.     Comments: Oriented to person and place.  Globally weak but able to hold legs and arms off the bed bilaterally.  Tongue is midline, no facial droop.  Psychiatric:        Behavior: Behavior normal.     ED Results / Procedures / Treatments   Labs (all labs ordered are listed, but only abnormal results are displayed) Labs Reviewed  CBC WITH DIFFERENTIAL/PLATELET - Abnormal; Notable for the following components:      Result Value   WBC 16.3 (*)    RBC 3.45 (*)    Hemoglobin 11.1 (*)    HCT 33.6 (*)    RDW 15.8 (*)    Neutro Abs 10.6 (*)    Monocytes Absolute 2.2 (*)    Abs Immature Granulocytes 0.10 (*)    All other components within normal limits  COMPREHENSIVE METABOLIC PANEL - Abnormal; Notable for the following components:   Glucose, Bld 101 (*)    Calcium 8.6 (*)    Total Protein 5.9 (*)    Albumin 2.6 (*)    GFR, Estimated 58 (*)    All other components within normal limits  URINALYSIS, ROUTINE W REFLEX MICROSCOPIC - Abnormal; Notable for the following components:   APPearance TURBID (*)    Hgb urine dipstick MODERATE (*)    Protein, ur 100 (*)    Leukocytes,Ua LARGE (*)    RBC / HPF >50 (*)    WBC, UA >50 (*)    Bacteria, UA MANY (*)    All other components within normal limits  CK - Abnormal; Notable for the following components:   Total CK 31 (*)    All other components within normal limits  LACTIC ACID, PLASMA - Abnormal; Notable for the following components:   Lactic Acid, Venous 2.5 (*)    All other components within normal limits  VALPROIC ACID LEVEL - Abnormal; Notable for the following components:   Valproic Acid Lvl 33 (*)    All other components within normal limits  CBG MONITORING, ED - Abnormal; Notable for the following components:   Glucose-Capillary 100 (*)    All other components within normal limits  URINE CULTURE  TSH  LACTIC ACID, PLASMA    EKG  EKG Interpretation  Date/Time:  Friday February 17 2022 12:51:55 EDT Ventricular Rate:   81 PR Interval:  174 QRS Duration: 138 QT Interval:  425 QTC Calculation: 494 R Axis:   -75 Text Interpretation: Sinus rhythm Atrial premature complex Right bundle branch block Inferior infarct, old Abnormal lateral Q waves Probable anterior infarct, age indeterminate No significant change was found Confirmed by Glynn Octave [phone]) on 02/17/2022 1:14:04 PM  Radiology CT Head Wo Contrast  Result Date: 02/17/2022 CLINICAL DATA:  Mental status change. EXAM: CT HEAD WITHOUT CONTRAST TECHNIQUE: Contiguous axial images were obtained from the base of the skull through the vertex without intravenous contrast. RADIATION DOSE REDUCTION: This exam was performed according to the departmental dose-optimization program which includes automated exposure control, adjustment of the mA and/or kV according to patient size and/or use of iterative reconstruction technique. COMPARISON:  02/04/2015 MRI.  Head CT of 02/01/2015. FINDINGS: Brain: Advanced cerebral and cerebellar atrophy. Moderate low density in the periventricular white matter likely related to small vessel disease. No mass lesion, hemorrhage, hydrocephalus, acute infarct, intra-axial, or extra-axial fluid collection. Vascular: No hyperdense vessel or unexpected calcification. Skull: Normal Sinuses/Orbits: Surgical changes in both globes. Right maxillary sinus near-complete opacification by dominant mucous retention cyst or polyp. Right-sided mastoid effusion. Fluid in the right middle ear including on 25/4. Other: None. IMPRESSION: 1. No acute intracranial abnormality. 2. Cerebral atrophy and small vessel ischemic change. 3. Right mastoid and middle ear effusion. 4. Sinus disease. Electronically Signed   By: Jeronimo Greaves M.D.   On: 02/17/2022 14:12   DG Chest Portable 1 View  Result Date: 02/17/2022 CLINICAL DATA:  Unresponsive, bradycardia EXAM: PORTABLE CHEST 1 VIEW COMPARISON:  None Available. FINDINGS: Normal mediastinum and cardiac silhouette. Normal  pulmonary vasculature. No evidence of effusion, infiltrate, or pneumothorax. No acute bony abnormality. IMPRESSION: No acute cardiopulmonary process. Electronically Signed   By: Genevive Bi M.D.   On: 02/17/2022 13:31    Procedures Procedures    Medications Ordered in ED Medications  lactated ringers bolus 500 mL (has no administration in time range)    ED Course/ Medical Decision Making/ A&P                           Medical Decision Making Amount and/or Complexity of Data Reviewed Independent Historian: caregiver and EMS Labs: ordered. Decision-making details documented in ED Course. Radiology: ordered and independent interpretation performed. Decision-making details documented in ED Course. ECG/medicine tests: ordered and independent interpretation performed. Decision-making details documented in ED Course.  Risk Prescription drug management. Decision regarding hospitalization.  Near syncope with generalized weakness.  Possible loss of consciousness while sitting on the toilet.  On arrival awake and alert with stable vital signs.  EKG is sinus rhythm with right bundle branch block which appears to be old  Patient tachypneic but no hypoxia or increased work of breathing. EKG shows a sinus rhythm with a right bundle branch block which appears to be old.  No Brugada, no prolonged QT.  Suspect likely vagal episode causing her syncope versus near syncope.  She is generally weak without focal deficits. Urinalysis is consistent with infection, culture is sent, antibiotics started.  Given patient's age, generalized weakness, loss of consciousness with UTI would benefit from admission for further evaluation of her syncope and weakness.  Admission discussed with Dr. Sharl Ma.  She has some persistent tachypnea as well but no hypoxia.  Will evaluate for pulmonary embolism.  Final Clinical Impression(s) / ED Diagnoses Final diagnoses:  Near syncope  Generalized weakness   Urinary tract infection without hematuria, site unspecified    Rx / DC Orders ED Discharge Orders     None         Tyresse Jayson, Jeannett Senior, MD 02/17/22 1536

## 2022-02-17 NOTE — H&P (Addendum)
TRH H&P    Patient Demographics:    Anna Mccoy, is a 88 y.o. female  MRN: [id number]  DOB - 02-12-1925  Admit Date - 02/17/2022  Referring MD/NP/PA: Laurey Arrow  Outpatient Primary MD for the patient is [name], MD  Patient coming from: Home  Chief complaint-altered mental status   HPI:    Anna Mccoy  is a 88 y.o. female, with medical history of hypertension, remote arthritis, TIA, hypothyroidism, previous stroke was brought to the ED for generalized weakness.  Patient states that this morning she felt fine and felt weak after having a shower.  Her aide helped her tolerate when she was attempting to have bowel movement.  She was noted to get weak and was unable to get off the toilet.  She was unresponsive to verbal stimulation.  EMS was called.  Patient was found to be hypotensive with blood pressure 86/40 and bradycardia with heart rate in 40s.  Patient was given IV fluid bolus and blood pressure improved to 127/80.  She did not lose consciousness or fall.  Patient does have indwelling Foley catheter with cloudy urine.  Catheter was placed a year ago for urinary retention.  She denies vomiting but complains of nausea this morning. Denies chest pain or shortness of breath. Did not pass out but was dizzy sitting on the commode and was unresponsive.  In the ED, CT head was unremarkable. Lactic acid 2.5, she was found to have abnormal UA.  Started on IV Rocephin.  Urine culture obtained.    Review of systems:    In addition to the HPI above,   All other systems reviewed and are negative.    Past History of the following :    Past Medical History:  Diagnosis Date   Anemia    Arthritis    Cerebral infarction due to unspecified mechanism    Cholesterol retinal embolus of both eyes 09/28/2015   Chronic diarrhea of unknown origin 12/04/2015   Chronic diastolic CHF (congestive heart  failure) (HCC)    CVA (cerebral infarction) 02/02/2015   DDD (degenerative disc disease), lumbar    Edema    Essential tremor 09/29/2015   Gait disturbance 06/06/2017   Hx-TIA (transient ischemic attack)    Hypertension    Hypertensive heart disease with heart failure (HCC) 01/13/2016   Hypertrophic toenail 09/28/2015   Hypothyroidism    HYPOTHYROIDISM   Impacted cerumen of right ear 01/10/2018   Kyphosis (acquired) (postural) 10/07/2016   Menopausal state age 79   Mitral valve regurgitation    TRIVIAL   Osteoporosis 09/28/2015   Peripheral neuropathy 09/28/2015   Post-menopausal bleeding 02/2011   Endo Biopsy 7/12 benign, no hyperplasia   Procidentia of uterus    uses pessary   PVC's (premature ventricular contractions)    RBBB    Rheumatoid arthritis(714.0)    Seizure cerebral (HCC)    Stroke (HCC) 1996   pt estimation   Sudden idiopathic hearing loss of right ear 01/18/2018   Syncope 11/10/2017   Vitamin D deficiency disease  Past Surgical History:  Procedure Laterality Date   BACK SURGERY     CHOLECYSTECTOMY, LAPAROSCOPIC  2002   COLONOSCOPY W/ BIOPSIES  6/09   sigmoid diverticuli recheck 5 years   EYE SURGERY  04/24/14   Cataract sx in right eye with lens replacement.   LAMINECTOMY AND MICRODISCECTOMY LUMBAR SPINE  01/23/09   VAGINAL DELIVERY     x5      Social History:      Social History   Tobacco Use   Smoking status: Never   Smokeless tobacco: Never  Substance Use Topics   Alcohol use: No       Family History :     Family History  Problem Relation Age of Onset   Hypertension Mother    Stroke Mother    Hypertension Sister    Diabetes Sister    Hypertension Sister    Diabetes Sister       Home Medications:   Prior to Admission medications   Medication Sig Start Date End Date Taking? Authorizing Provider  acetaminophen (TYLENOL) 500 MG tablet Take 500 mg by mouth every 6 (six) hours as needed for moderate pain.    [provider]   amLODipine (NORVASC) 5 MG tablet Take 5 mg by mouth daily.    [provider]  aspirin 81 MG tablet Take 81 mg by mouth daily.     [provider]  atorvastatin (LIPITOR) 20 MG tablet Take 20 mg by mouth every evening.     [provider]  bethanechol (URECHOLINE) 10 MG tablet Take 1 tablet (10 mg total) by mouth 2 (two) times daily. Patient not taking: Reported on 11/29/2021 05/21/20   Marcine Matar, MD  carvedilol (COREG) 6.25 MG tablet Take 6.25 mg by mouth 2 (two) times daily. 11/18/21   [provider]  divalproex (DEPAKOTE) 250 MG DR tablet Take 1 tablet (250 mg total) by mouth every 12 (twelve) hours. 02/05/15   Rhetta Mura, MD  Docusate Sodium (COLACE PO) Take 1 tablet by mouth 2 (two) times daily.    [provider]  Fluocinolone Acetonide Body 0.01 % OIL Apply topically daily. 09/01/21   [provider]  Fluocinolone Acetonide Scalp 0.01 % OIL Apply topically daily as needed. 10/31/21   [provider]  fluticasone (FLONASE) 50 MCG/ACT nasal spray Place 1 spray into both nostrils daily.  01/14/18   [provider]  folic acid (FOLVITE) 1 MG tablet Take 1 mg by mouth daily.    [provider]  furosemide (LASIX) 20 MG tablet Take 20 mg by mouth daily. 10/20/16   [provider]  hydrocortisone (ANUSOL-HC) 25 MG suppository Place 1 suppository (25 mg total) rectally 2 (two) times daily as needed for hemorrhoids or anal itching. Patient not taking: Reported on 11/29/2021 01/21/21   Long, Arlyss Repress, MD  hypromellose (GENTEAL) 0.3 % GEL ophthalmic ointment Place 1 application into both eyes at bedtime.    [provider]  leflunomide (ARAVA) 20 MG tablet Take 20 mg by mouth as directed. 03/03/20   [provider]  levothyroxine (SYNTHROID) 100 MCG tablet Take 100 mcg by mouth daily. 09/29/21   [provider]  levothyroxine (SYNTHROID) 88 MCG tablet Take 88 mcg by mouth daily  before breakfast.  12/06/19   [provider]  Multiple Vitamins-Minerals (PRESERVISION AREDS PO) Take 1 capsule by mouth daily.    [provider]  nitrofurantoin (MACRODANTIN) 100 MG capsule Take 100 mg by mouth 2 (two) times  daily. Start date : 05/28/20 Patient not taking: Reported on 11/29/2021    [provider]  olmesartan (BENICAR) 40 MG tablet Take 40 mg by mouth daily. 11/18/21   [provider]  olmesartan-hydrochlorothiazide (BENICAR HCT) 40-25 MG tablet Take 1 tablet by mouth daily. 11/05/17   [provider]  oxybutynin (DITROPAN) 5 MG tablet Take 1 tablet (5 mg total) by mouth every 8 (eight) hours as needed for bladder spasms (and foley leakage). 11/23/21   Summerlin, Ofilia Neas Annette, PA-C  POLY-IRON 150 150 MG capsule Take 150 mg by mouth daily.  10/13/15   [provider]  potassium chloride SA (K-DUR,KLOR-CON) 20 MEQ tablet Take 20 mEq by mouth 2 (two) times daily. Patient not taking: Reported on 11/29/2021    [provider]  primidone (MYSOLINE) 50 MG tablet Take 1 tablet by mouth daily.  09/20/16   [provider]  REFRESH OPTIVE ADVANCED 0.5-1-0.5 % SOLN Apply 1 drop to eye in the morning, at noon, and at bedtime.  12/23/19   [provider]  TOBRADEX ophthalmic ointment SMARTSIG:1 sparingly In Eye(s) Every Evening 11/22/21   [provider]  triamcinolone cream (KENALOG) 0.1 % Apply 1 application topically 2 (two) times daily as needed (itching).  02/07/20   [provider]  UNABLE TO FIND Take 1 capsule by mouth daily. Ultra flora     [provider]     Allergies:     Allergies  Allergen Reactions   Ace Inhibitors Cough   Ciprofloxacin Diarrhea   Lidocaine Other (See Comments)    unknown   Neosporin [Neomycin-Bacitracin Zn-Polymyx] Other (See Comments)    unknown   Sulfa Antibiotics Other (See Comments)    unknown     Physical Exam:   Vitals  Blood pressure (!)  148/94, pulse 79, temperature 98.4 F (36.9 C), temperature source Oral, resp. rate (!) 22, height 4\' 11"  (1.499 m), weight 52 kg, last menstrual period 08/14/1981, SpO2 98 %.  1.  General: Appears in no acute distress  2. Psychiatric: Alert, oriented x3, intact insight and judgment  3. Neurologic: Cranial nerves II through XII grossly intact, no focal deficit noted, moving all extremities, no facial droop noted  4. HEENMT:  Atraumatic normocephalic, extraocular muscles are intact  5. Respiratory : Clear to auscultation bilaterally, no wheezing or crackles auscultated  6. Cardiovascular : S1-S2, regular, no murmur auscultated  7. Gastrointestinal:  Abdomen is soft, nontender, organomegaly     Data Review:    CBC Recent Labs  Lab 02/17/22 1306  WBC 16.3*  HGB 11.1*  HCT 33.6*  PLT 201  MCV 97.4  MCH 32.2  MCHC 33.0  RDW 15.8*  LYMPHSABS 3.4  MONOABS 2.2*  EOSABS 0.1  BASOSABS 0.1   ------------------------------------------------------------------------------------------------------------------  Results for orders placed or performed during the hospital encounter of 02/17/22 (from the past 48 hour(s))  Urinalysis, Routine w reflex microscopic Urine, Clean Catch     Status: Abnormal   Collection Time: 02/17/22  1:04 PM  Result Value Ref Range   Color, Urine YELLOW YELLOW   APPearance TURBID (A) CLEAR   Specific Gravity, Urine 1.010 1.005 - 1.030   pH 7.0 5.0 - 8.0   Glucose, UA NEGATIVE NEGATIVE mg/dL   Hgb urine dipstick MODERATE (A) NEGATIVE   Bilirubin Urine NEGATIVE NEGATIVE   Ketones, ur NEGATIVE NEGATIVE mg/dL   Protein, ur 04/20/22 (A) NEGATIVE mg/dL   Nitrite NEGATIVE NEGATIVE   Leukocytes,Ua LARGE (A) NEGATIVE   RBC /  HPF >50 (H) 0 - 5 RBC/hpf   WBC, UA >50 (H) 0 - 5 WBC/hpf   Bacteria, UA MANY (A) NONE SEEN   Squamous Epithelial / LPF 0-5 0 - 5   WBC Clumps PRESENT     Comment: Performed at [facility], [address], [place], [place]  [address]  CBC with Differential     Status: Abnormal   Collection Time: 02/17/22  1:06 PM  Result Value Ref Range   WBC 16.3 (H) 4.0 - 10.5 K/uL   RBC 3.45 (L) 3.87 - 5.11 MIL/uL   Hemoglobin 11.1 (L) 12.0 - 15.0 g/dL   HCT 86.5 (L) 78.4 - 69.6 %   MCV 97.4 80.0 - 100.0 fL   MCH 32.2 26.0 - 34.0 pg   MCHC 33.0 30.0 - 36.0 g/dL   RDW 29.5 (H) 28.4 - 13.2 %   Platelets 201 150 - 400 K/uL   nRBC 0.0 0.0 - 0.2 %   Neutrophils Relative % 64 %   Neutro Abs 10.6 (H) 1.7 - 7.7 K/uL   Lymphocytes Relative 21 %   Lymphs Abs 3.4 0.7 - 4.0 K/uL   Monocytes Relative 13 %   Monocytes Absolute 2.2 (H) 0.1 - 1.0 K/uL   Eosinophils Relative 1 %   Eosinophils Absolute 0.1 0.0 - 0.5 K/uL   Basophils Relative 0 %   Basophils Absolute 0.1 0.0 - 0.1 K/uL   Immature Granulocytes 1 %   Abs Immature Granulocytes 0.10 (H) 0.00 - 0.07 K/uL    Comment: Performed at [facility], [address], [place], [place] [address]  Comprehensive metabolic panel     Status: Abnormal   Collection Time: 02/17/22  1:06 PM  Result Value Ref Range   Sodium 135 135 - 145 mmol/L   Potassium 3.7 3.5 - 5.1 mmol/L   Chloride 100 98 - 111 mmol/L   CO2 22 22 - 32 mmol/L   Glucose, Bld 101 (H) 70 - 99 mg/dL    Comment: Glucose reference range applies only to samples taken after fasting for at least 8 hours.   BUN 10 8 - 23 mg/dL   Creatinine, Ser 2.72 0.44 - 1.00 mg/dL   Calcium 8.6 (L) 8.9 - 10.3 mg/dL   Total Protein 5.9 (L) 6.5 - 8.1 g/dL   Albumin 2.6 (L) 3.5 - 5.0 g/dL   AST 22 15 - 41 U/L   ALT 14 0 - 44 U/L   Alkaline Phosphatase 55 38 - 126 U/L   Total Bilirubin 0.3 0.3 - 1.2 mg/dL   GFR, Estimated 58 (L) >60 mL/min    Comment: (NOTE) Calculated using the CKD-EPI Creatinine Equation (2021)    Anion gap 13 5 - 15    Comment: Performed at [facility], [address] [place], [place] [address]  CK     Status: Abnormal   Collection Time: 02/17/22  1:06 PM  Result Value Ref Range   Total CK 31 (L) 38  - 234 U/L    Comment: Performed at [facility], [address], [place], [place] [address]  TSH     Status: None   Collection Time: 02/17/22  1:06 PM  Result Value Ref Range   TSH 0.996 0.350 - 4.500 uIU/mL    Comment: Performed by a 3rd Generation assay with a functional sensitivity of <=0.01 uIU/mL. Performed at [facility], [address], [place], [place] [address]   Lactic acid,  plasma     Status: Abnormal   Collection Time: 02/17/22  1:06 PM  Result Value Ref Range   Lactic Acid, Venous 2.5 (HH) 0.5 - 1.9 mmol/L    Comment: CRITICAL RESULT CALLED TO, READ BACK BY AND VERIFIED WITH: Gwenith Spitz, RN 1416 02/17/22 L. KLAR Performed at [facility], [address], [place], [place] [address]   Valproic acid level     Status: Abnormal   Collection Time: 02/17/22  1:31 PM  Result Value Ref Range   Valproic Acid Lvl 33 (L) 50.0 - 100.0 ug/mL    Comment: Performed at [facility], [address] [place], [place] [address]  CBG monitoring, ED     Status: Abnormal   Collection Time: 02/17/22  1:32 PM  Result Value Ref Range   Glucose-Capillary 100 (H) 70 - 99 mg/dL    Comment: Glucose reference range applies only to samples taken after fasting for at least 8 hours.    Chemistries  Recent Labs  Lab 02/17/22 1306  NA 135  K 3.7  CL 100  CO2 22  GLUCOSE 101*  BUN 10  CREATININE 0.92  CALCIUM 8.6*  AST 22  ALT 14  ALKPHOS 55  BILITOT 0.3   ------------------------------------------------------------------------------------------------------------------  ------------------------------------------------------------------------------------------------------------------ GFR: Estimated Creatinine Clearance: 27.6 mL/min (by C-G formula based on SCr of 0.92 mg/dL). Liver Function Tests: Recent Labs  Lab 02/17/22 1306  AST 22  ALT 14  ALKPHOS 55  BILITOT 0.3  PROT 5.9*  ALBUMIN 2.6*   No results for input(s): "LIPASE", "AMYLASE" in the last 168  hours. No results for input(s): "AMMONIA" in the last 168 hours. Coagulation Profile: No results for input(s): "INR", "PROTIME" in the last 168 hours. Cardiac Enzymes: Recent Labs  Lab 02/17/22 1306  CKTOTAL 31*   BNP (last 3 results) No results for input(s): "PROBNP" in the last 8760 hours. HbA1C: No results for input(s): "HGBA1C" in the last 72 hours. CBG: Recent Labs  Lab 02/17/22 1332  GLUCAP 100*   Lipid Profile: No results for input(s): "CHOL", "HDL", "LDLCALC", "TRIG", "CHOLHDL", "LDLDIRECT" in the last 72 hours. Thyroid Function Tests: Recent Labs    02/17/22 1306  TSH 0.996   Anemia Panel: No results for input(s): "VITAMINB12", "FOLATE", "FERRITIN", "TIBC", "IRON", "RETICCTPCT" in the last 72 hours.  --------------------------------------------------------------------------------------------------------------- Urine analysis:    Component Value Date/Time   COLORURINE YELLOW 02/17/2022 1304   APPEARANCEUR TURBID (A) 02/17/2022 1304   APPEARANCEUR Clear 04/13/2020 1524   LABSPEC 1.010 02/17/2022 1304   PHURINE 7.0 02/17/2022 1304   GLUCOSEU NEGATIVE 02/17/2022 1304   HGBUR MODERATE (A) 02/17/2022 1304   BILIRUBINUR NEGATIVE 02/17/2022 1304   BILIRUBINUR Negative 04/13/2020 1524   KETONESUR NEGATIVE 02/17/2022 1304   PROTEINUR 100 (A) 02/17/2022 1304   UROBILINOGEN negative (A) 01/06/2020 1049   UROBILINOGEN 1.0 02/04/2015 0539   NITRITE NEGATIVE 02/17/2022 1304   LEUKOCYTESUR LARGE (A) 02/17/2022 1304      Imaging Results:    CT Head Wo Contrast  Result Date: 02/17/2022 CLINICAL DATA:  Mental status change. EXAM: CT HEAD WITHOUT CONTRAST TECHNIQUE: Contiguous axial images were obtained from the base of the skull through the vertex without intravenous contrast. RADIATION DOSE REDUCTION: This exam was performed according to the departmental dose-optimization program which includes automated exposure control, adjustment of the mA and/or kV according to  patient size and/or use of iterative reconstruction technique. COMPARISON:  02/04/2015 MRI.  Head CT of 02/01/2015. FINDINGS: Brain: Advanced cerebral and cerebellar  atrophy. Moderate low density in the periventricular white matter likely related to small vessel disease. No mass lesion, hemorrhage, hydrocephalus, acute infarct, intra-axial, or extra-axial fluid collection. Vascular: No hyperdense vessel or unexpected calcification. Skull: Normal Sinuses/Orbits: Surgical changes in both globes. Right maxillary sinus near-complete opacification by dominant mucous retention cyst or polyp. Right-sided mastoid effusion. Fluid in the right middle ear including on 25/4. Other: None. IMPRESSION: 1. No acute intracranial abnormality. 2. Cerebral atrophy and small vessel ischemic change. 3. Right mastoid and middle ear effusion. 4. Sinus disease. Electronically Signed   By: Jeronimo Greaves M.D.   On: 02/17/2022 14:12   DG Chest Portable 1 View  Result Date: 02/17/2022 CLINICAL DATA:  Unresponsive, bradycardia EXAM: PORTABLE CHEST 1 VIEW COMPARISON:  None Available. FINDINGS: Normal mediastinum and cardiac silhouette. Normal pulmonary vasculature. No evidence of effusion, infiltrate, or pneumothorax. No acute bony abnormality. IMPRESSION: No acute cardiopulmonary process. Electronically Signed   By: Genevive Bi M.D.   On: 02/17/2022 13:31    My personal review of EKG: Rhythm NSR, no ST changes   Assessment & Plan:    Principal Problem:   Syncope   Presyncope-likely vasovagal, patient was sitting on the commode when she had episode of unresponsiveness.  Presyncope versus questionable syncope.  Blood pressure was found to be low 86/40.  Patient heart rate was in 40s.  BMP in am patient blood pressure improved with IV fluids.  Will monitor on telemetry.  EKG is unremarkable.  She denies chest pain.  CTA chest ordered by ED provider to rule out pulmonary embolism.Will obtain echocardiogram.  We will check  orthostatic vital signs every 4 hours x3. UTI-catheter associated-patient has chronic Foley catheter in place due to urinary retention.  UA is abnormal with cloudy urine.  Patient started on IV Rocephin.  Urine culture obtained.  Follow urine culture results. Chronic HFpEF-appears euvolemic.  Started on IV fluids at 75 mill per hour.  We will keep a close watch on intake and output.  We will hold Lasix at this time. Hypertension-patient on multiple antihypertensive medications at home.  We will hold his medications due to above.  Consider restarting these medications once patient blood pressure starts to go up. Hypothyroidism-patient is on Synthroid 100 mcg as well as 88 mcg daily.  Awaiting med reconciliation to confirm the correct dose.  We will restart Synthroid once med reconciliation complete.  TSH 0.996 Rheumatoid arthritis-continue Arava Hyperlipidemia-continue Lipitor   DVT Prophylaxis-   Lovenox   AM Labs Ordered, also please review Full Orders  Family Communication: Admission, patients condition and plan of care including tests being ordered have been discussed with the patient and her daughter at bedside who indicate understanding and agree with the plan and Code Status.  Code Status: DNR  Admission status: Inpatient :The appropriate admission status for this patient is INPATIENT. Inpatient status is judged to be reasonable and necessary in order to provide the required intensity of service to ensure the patient's safety. The patient's presenting symptoms, physical exam findings, and initial radiographic and laboratory data in the context of their chronic comorbidities is felt to place them at high risk for further clinical deterioration. Furthermore, it is not anticipated that the patient will be medically stable for discharge from the hospital within 2 midnights of admission. The following factors support the admission status of inpatient.            * I certify that at the  point of admission it is my clinical judgment that  the patient will require inpatient hospital care spanning beyond 2 midnights from the point of admission due to high intensity of service, high risk for further deterioration and high frequency of surveillance required.*  Time spent in minutes : 60 min   Nico Syme S Carolena Fairbank M.D

## 2022-02-17 NOTE — ED Notes (Signed)
Date and time results received: 02/17/22 2:18 PM Test: lactic acid   Critical Value: 2.5 Name of Provider Notified: Rancour, MD

## 2022-02-18 ENCOUNTER — Other Ambulatory Visit (HOSPITAL_COMMUNITY): Payer: Medicare Other

## 2022-02-18 ENCOUNTER — Inpatient Hospital Stay (HOSPITAL_COMMUNITY): Payer: Medicare Other

## 2022-02-18 DIAGNOSIS — R55 Syncope and collapse: Secondary | ICD-10-CM | POA: Diagnosis not present

## 2022-02-18 DIAGNOSIS — N390 Urinary tract infection, site not specified: Secondary | ICD-10-CM | POA: Diagnosis not present

## 2022-02-18 LAB — COMPREHENSIVE METABOLIC PANEL
ALT: 14 U/L (ref 0–44)
AST: 23 U/L (ref 15–41)
Albumin: 2.4 g/dL — ABNORMAL LOW (ref 3.5–5.0)
Alkaline Phosphatase: 47 U/L (ref 38–126)
Anion gap: 11 (ref 5–15)
BUN: 7 mg/dL — ABNORMAL LOW (ref 8–23)
CO2: 22 mmol/L (ref 22–32)
Calcium: 8.6 mg/dL — ABNORMAL LOW (ref 8.9–10.3)
Chloride: 104 mmol/L (ref 98–111)
Creatinine, Ser: 0.71 mg/dL (ref 0.44–1.00)
GFR, Estimated: >60 mL/min (ref >60)
Glucose, Bld: 78 mg/dL (ref 70–99)
Potassium: 3.5 mmol/L (ref 3.5–5.1)
Sodium: 137 mmol/L (ref 135–145)
Total Bilirubin: 0.7 mg/dL (ref 0.3–1.2)
Total Protein: 5.6 g/dL — ABNORMAL LOW (ref 6.5–8.1)

## 2022-02-18 LAB — CBC
HCT: 30.5 % — ABNORMAL LOW (ref 36.0–46.0)
Hemoglobin: 10.1 g/dL — ABNORMAL LOW (ref 12.0–15.0)
MCH: 31.6 pg (ref 26.0–34.0)
MCHC: 33.1 g/dL (ref 30.0–36.0)
MCV: 95.3 fL (ref 80.0–100.0)
Platelets: 190 10*3/uL (ref 150–400)
RBC: 3.2 MIL/uL — ABNORMAL LOW (ref 3.87–5.11)
RDW: 15.5 % (ref 11.5–15.5)
WBC: 13.3 10*3/uL — ABNORMAL HIGH (ref 4.0–10.5)
nRBC: 0 % (ref 0.0–0.2)

## 2022-02-18 MED ORDER — AMLODIPINE BESYLATE 2.5 MG PO TABS
2.5000 mg | ORAL_TABLET | Freq: Every day | ORAL | Status: DC
Start: 2022-02-18 — End: 2022-02-20
  Administered 2022-02-18 – 2022-02-20 (×3): 2.5 mg via ORAL
  Filled 2022-02-18 (×3): qty 1

## 2022-02-18 MED ORDER — CHLORHEXIDINE GLUCONATE CLOTH 2 % EX PADS
6.0000 | MEDICATED_PAD | Freq: Every day | CUTANEOUS | Status: DC
  Administered 2022-02-18 – 2022-02-20 (×3): 6 via TOPICAL

## 2022-02-18 MED ORDER — SODIUM CHLORIDE 0.9 % IV SOLN: INTRAVENOUS | Status: DC | PRN

## 2022-02-18 MED ORDER — LEVOTHYROXINE SODIUM 100 MCG PO TABS
100.0000 ug | ORAL_TABLET | Freq: Every day | ORAL | Status: DC
  Administered 2022-02-18 – 2022-02-20 (×3): 100 ug via ORAL
  Filled 2022-02-18 (×3): qty 1

## 2022-02-18 MED ORDER — CARVEDILOL 6.25 MG PO TABS
6.2500 mg | ORAL_TABLET | Freq: Two times a day (BID) | ORAL | Status: DC
  Administered 2022-02-18 – 2022-02-20 (×4): 6.25 mg via ORAL
  Filled 2022-02-18 (×4): qty 1

## 2022-02-18 NOTE — Plan of Care (Signed)
  Problem: Pain Managment: Goal: General experience of comfort will improve Outcome: Progressing   Problem: Safety: Goal: Ability to remain free from injury will improve Outcome: Progressing   

## 2022-02-18 NOTE — Progress Notes (Addendum)
[facility]  PROGRESS NOTE  Anna Mccoy NGE:[id number] DOB: 1924-12-19 DOA: 02/17/2022 PCP: [name], MD   Brief HPI:    88 y.o. female, with medical history of hypertension, remote arthritis, TIA, hypothyroidism, previous stroke was brought to the ED for generalized weakness.  Patient states that this morning she felt fine and felt weak after having a shower.  Her aide helped her tolerate when she was attempting to have bowel movement.  She was noted to get weak and was unable to get off the toilet.  She was unresponsive to verbal stimulation.  EMS was called.  Patient was found to be hypotensive with blood pressure 86/40 and bradycardia with heart rate in 40s.  Patient was given IV fluid bolus and blood pressure improved to 127/80.  She did not lose consciousness or fall.  Patient does have indwelling Foley catheter with cloudy urine.  Catheter was placed a year ago for urinary retention.  She denies vomiting but complains of nausea this morning. Denies chest pain or shortness of breath. Did not pass out but was dizzy sitting on the commode and was unresponsive. CT head was unremarkable. She was found to have abnormal UA, started on IV Rocephin.    Subjective   Patient seen and examined, denies any complaints.   Assessment/Plan:   Near syncope -Likely vasovagal; happened while sitting on commode -Continue monitoring on telemetry -Echocardiogram pending -Check orthostatics  Catheter related UTI -Patient has chronic Foley catheter -Was placed by urology due to chronic urinary retention -Found to have abnormal UA, started on IV Rocephin -Follow urine culture results  Chronic HFpEF -Appears euvolemic  Hypertension -Blood pressure is stable -Home medications on hold -We will cut down some of antihypertensive medications on discharge  Hypothyroidism -Patient states that she takes Synthroid 100 mcg daily -We will start her back on Synthroid 100 mcg  daily  Rheumatoid arthritis -Patient not taking Areva, will discontinue Areva at this time  Hyperlipidemia -Continue Lipitor    Medications     aspirin EC  81 mg Oral Daily   atorvastatin  20 mg Oral QPM   divalproex  250 mg Oral Q12H   enoxaparin (LOVENOX) injection  30 mg Subcutaneous Q24H   levothyroxine  100 mcg Oral Q0600   primidone  50 mg Oral Daily     Data Reviewed:   CBG:  Recent Labs  Lab 02/17/22 1332  GLUCAP 100*    SpO2: 97 %    Vitals:   02/17/22 2052 02/18/22 0602 02/18/22 0803 02/18/22 1142  BP: (!) 155/70 124/62 128/66 137/76  Pulse: 71 73 70 69  Resp: 20  20 19   Temp: 98 F (36.7 C) 98.7 F (37.1 C) 98.4 F (36.9 C) 98.6 F (37 C)  TempSrc: Oral Oral Oral Oral  SpO2: 98% 98% 97% 97%  Weight: 23.1 kg 23.1 kg    Height: 4\' 11"  (1.499 m)         Data Reviewed:  Basic Metabolic Panel: Recent Labs  Lab 02/17/22 1306 02/17/22 1557 02/18/22 0206  NA 135  --  137  K 3.7  --  3.5  CL 100  --  104  CO2 22  --  22  GLUCOSE 101*  --  78  BUN 10  --  7*  CREATININE 0.92 0.82 0.71  CALCIUM 8.6*  --  8.6*    CBC: Recent Labs  Lab 02/17/22 1306 02/17/22 1557 02/18/22 0206  WBC 16.3* 16.3* 13.3*  NEUTROABS 10.6*  --   --  HGB 11.1* 10.4* 10.1*  HCT 33.6* 32.5* 30.5*  MCV 97.4 98.8 95.3  PLT 201 189 190    LFT Recent Labs  Lab 02/17/22 1306 02/18/22 0206  AST 22 23  ALT 14 14  ALKPHOS 55 47  BILITOT 0.3 0.7  PROT 5.9* 5.6*  ALBUMIN 2.6* 2.4*     Antibiotics: Anti-infectives (From admission, onward)    Start     Dose/Rate Route Frequency Ordered Stop   02/18/22 1000  cefTRIAXone (ROCEPHIN) 1 g in sodium chloride 0.9 % 100 mL IVPB        1 g 200 mL/hr over 30 Minutes Intravenous Every 24 hours 02/17/22 1551     02/17/22 1445  cefTRIAXone (ROCEPHIN) 1 g in sodium chloride 0.9 % 100 mL IVPB        1 g 200 mL/hr over 30 Minutes Intravenous  Once 02/17/22 1440 02/17/22 1522        DVT prophylaxis:  Lovenox  Code Status: Full code  Family Communication: No family at bedside   CONSULTS    Objective    Physical Examination:   General-appears in no acute distress Heart-S1-S2, regular, no murmur auscultated Lungs-clear to auscultation bilaterally, no wheezing or crackles auscultated Abdomen-soft, nontender, no organomegaly Extremities-no edema in the lower extremities Neuro-alert, oriented x3, no focal deficit noted  Status is: Inpatient:          Meredeth Ide   [facility] If 7PM-7AM, please contact night-coverage at [email or web address], Office  [phone]   02/18/2022, 1:22 PM  LOS: 1 day

## 2022-02-19 ENCOUNTER — Inpatient Hospital Stay (HOSPITAL_COMMUNITY): Payer: Medicare Other

## 2022-02-19 DIAGNOSIS — R55 Syncope and collapse: Secondary | ICD-10-CM | POA: Diagnosis not present

## 2022-02-19 DIAGNOSIS — N390 Urinary tract infection, site not specified: Secondary | ICD-10-CM | POA: Diagnosis not present

## 2022-02-19 DIAGNOSIS — R531 Weakness: Secondary | ICD-10-CM | POA: Diagnosis not present

## 2022-02-19 LAB — CBC
HCT: 28.8 % — ABNORMAL LOW (ref 36.0–46.0)
Hemoglobin: 9.9 g/dL — ABNORMAL LOW (ref 12.0–15.0)
MCH: 32.4 pg (ref 26.0–34.0)
MCHC: 34.4 g/dL (ref 30.0–36.0)
MCV: 94.1 fL (ref 80.0–100.0)
Platelets: 186 10*3/uL (ref 150–400)
RBC: 3.06 MIL/uL — ABNORMAL LOW (ref 3.87–5.11)
RDW: 15.3 % (ref 11.5–15.5)
WBC: 10.7 10*3/uL — ABNORMAL HIGH (ref 4.0–10.5)
nRBC: 0 % (ref 0.0–0.2)

## 2022-02-19 LAB — ECHOCARDIOGRAM COMPLETE
Area-P 1/2: 3.01 cm2
Height: 59 in
S' Lateral: 1.5 cm
Weight: 1835.2 oz

## 2022-02-19 MED ORDER — POLYETHYLENE GLYCOL 3350 17 G PO PACK
17.0000 g | PACK | Freq: Every day | ORAL | Status: DC | PRN
  Administered 2022-02-19: 17 g via ORAL
  Filled 2022-02-19 (×2): qty 1

## 2022-02-19 MED ORDER — SODIUM CHLORIDE 0.9 % IV SOLN
2.0000 g | INTRAVENOUS | Status: DC
  Administered 2022-02-19 – 2022-02-20 (×2): 2 g via INTRAVENOUS
  Filled 2022-02-19 (×2): qty 12.5

## 2022-02-19 MED ORDER — POLYETHYLENE GLYCOL 3350 17 G PO PACK: 17.0000 g | PACK | Freq: Every day | ORAL | Status: DC | PRN

## 2022-02-19 MED ORDER — IRBESARTAN 300 MG PO TABS
150.0000 mg | ORAL_TABLET | Freq: Every day | ORAL | Status: DC
  Administered 2022-02-19 – 2022-02-20 (×2): 150 mg via ORAL
  Filled 2022-02-19 (×2): qty 1

## 2022-02-19 NOTE — Plan of Care (Signed)
  Problem: Education: Goal: Knowledge of General Education information will improve Description: Including pain rating scale, medication(s)/side effects and non-pharmacologic comfort measures Outcome: Progressing   Problem: Health Behavior/Discharge Planning: Goal: Ability to manage health-related needs will improve Outcome: Progressing   Problem: Clinical Measurements: Goal: Will remain free from infection Outcome: Progressing   

## 2022-02-19 NOTE — Progress Notes (Signed)
Pt states no bowel movement for 3 days and requests laxative. MD paged.

## 2022-02-19 NOTE — Progress Notes (Signed)
[facility]  PROGRESS NOTE  Anna Mccoy TJQ:[id number] DOB: 07/30/1925 DOA: 02/17/2022 PCP: [name], MD   Brief HPI:    88 y.o. female, with medical history of hypertension, remote arthritis, TIA, hypothyroidism, previous stroke was brought to the ED for generalized weakness.  Patient states that this morning she felt fine and felt weak after having a shower.  Her aide helped her tolerate when she was attempting to have bowel movement.  She was noted to get weak and was unable to get off the toilet.  She was unresponsive to verbal stimulation.  EMS was called.  Patient was found to be hypotensive with blood pressure 86/40 and bradycardia with heart rate in 40s.  Patient was given IV fluid bolus and blood pressure improved to 127/80.  She did not lose consciousness or fall.  Patient does have indwelling Foley catheter with cloudy urine.  Catheter was placed a year ago for urinary retention.  She denies vomiting but complains of nausea this morning. Denies chest pain or shortness of breath. Did not pass out but was dizzy sitting on the commode and was unresponsive. CT head was unremarkable. She was found to have abnormal UA, started on IV Rocephin.    Subjective   Patient seen and examined, denies shortness of breath.   Assessment/Plan:   Near syncope -Likely vasovagal; happened while sitting on commode -Continue monitoring on telemetry -Echocardiogram done, result pending -Orthostatics negative  Catheter related UTI -Patient has chronic Foley catheter -Was placed by urology due to chronic urinary retention -Found to have abnormal UA, started on IV Rocephin -Urine culture growing Pseudomonas aeruginosa -We will change antibiotic to cefepime, final culture result pending  Chronic HFpEF -Appears euvolemic  Hypertension -Blood pressure is stable -Started back on Coreg and amlodipine   Hypothyroidism -Patient states that she takes Synthroid 100 mcg daily -We  will start her back on Synthroid 100 mcg daily  Rheumatoid arthritis -Patient not taking Arava, will discontinue Arava at this time  Hyperlipidemia -Continue Lipitor    Medications     amLODipine  2.5 mg Oral Daily   aspirin EC  81 mg Oral Daily   atorvastatin  20 mg Oral QPM   carvedilol  6.25 mg Oral BID   Chlorhexidine Gluconate Cloth  6 each Topical Daily   divalproex  250 mg Oral Q12H   enoxaparin (LOVENOX) injection  30 mg Subcutaneous Q24H   levothyroxine  100 mcg Oral Q0600   primidone  50 mg Oral Daily     Data Reviewed:   CBG:  Recent Labs  Lab 02/17/22 1332  GLUCAP 100*    SpO2: 99 %    Vitals:   02/18/22 1817 02/18/22 2230 02/19/22 0605 02/19/22 1050  BP: (!) 189/96 (!) 174/90 140/79 (!) 162/83  Pulse: 72 73 77 75  Resp:  18 18 18   Temp: 99 F (37.2 C) 98.9 F (37.2 C) 99.1 F (37.3 C) 99.2 F (37.3 C)  TempSrc: Oral Oral Oral Oral  SpO2: 98% 97% 96% 99%  Weight:   52 kg   Height:          Data Reviewed:  Basic Metabolic Panel: Recent Labs  Lab 02/17/22 1306 02/17/22 1557 02/18/22 0206  NA 135  --  137  K 3.7  --  3.5  CL 100  --  104  CO2 22  --  22  GLUCOSE 101*  --  78  BUN 10  --  7*  CREATININE 0.92 0.82  0.71  CALCIUM 8.6*  --  8.6*    CBC: Recent Labs  Lab 02/17/22 1306 02/17/22 1557 02/18/22 0206 02/19/22 0203  WBC 16.3* 16.3* 13.3* 10.7*  NEUTROABS 10.6*  --   --   --   HGB 11.1* 10.4* 10.1* 9.9*  HCT 33.6* 32.5* 30.5* 28.8*  MCV 97.4 98.8 95.3 94.1  PLT 201 189 190 186    LFT Recent Labs  Lab 02/17/22 1306 02/18/22 0206  AST 22 23  ALT 14 14  ALKPHOS 55 47  BILITOT 0.3 0.7  PROT 5.9* 5.6*  ALBUMIN 2.6* 2.4*     Antibiotics: Anti-infectives (From admission, onward)    Start     Dose/Rate Route Frequency Ordered Stop   02/19/22 1000  ceFEPIme (MAXIPIME) 2 g in sodium chloride 0.9 % 100 mL IVPB        2 g 200 mL/hr over 30 Minutes Intravenous Every 24 hours 02/19/22 0908     02/18/22 1000   cefTRIAXone (ROCEPHIN) 1 g in sodium chloride 0.9 % 100 mL IVPB  Status:  Discontinued        1 g 200 mL/hr over 30 Minutes Intravenous Every 24 hours 02/17/22 1551 02/19/22 0846   02/17/22 1445  cefTRIAXone (ROCEPHIN) 1 g in sodium chloride 0.9 % 100 mL IVPB        1 g 200 mL/hr over 30 Minutes Intravenous  Once 02/17/22 1440 02/17/22 1522        DVT prophylaxis: Lovenox  Code Status: Full code  Family Communication: No family at bedside   CONSULTS    Objective    Physical Examination:   General-appears in no acute distress Heart-S1-S2, regular, no murmur auscultated Lungs-clear to auscultation bilaterally, no wheezing or crackles auscultated Abdomen-soft, nontender, no organomegaly Extremities-no edema in the lower extremities Neuro-alert, oriented x3, no focal deficit noted  Status is: Inpatient:          Meredeth Ide   [facility] If 7PM-7AM, please contact night-coverage at [email or web address], Office  [phone]   02/19/2022, 1:03 PM  LOS: 2 days

## 2022-02-19 NOTE — Progress Notes (Signed)
  Echocardiogram 2D Echocardiogram has been performed.  Leta Jungling M 02/19/2022, 9:45 AM

## 2022-02-19 NOTE — Progress Notes (Signed)
Pharmacy Antibiotic Note  Anna Mccoy is a 88 y.o. female admitted on 02/17/2022 with pseudomonas UTI.  Pharmacy has been consulted for cefepime dosing. Tmax 99.1, WBCs 10.7 improving. Scr 0.71 improving.   Plan: Change ceftriaxone to cefepime 2g IV q24h Fu Ucx for susceptibilities Monitor renal fxn, temp, s/sx infection daily  Height: 4\' 11"  (149.9 cm) Weight: 52 kg (114 lb 11.2 oz) IBW/kg (Calculated) : 43.2  Temp (24hrs), Avg:98.9 F (37.2 C), Min:98.6 F (37 C), Max:99.1 F (37.3 C)  Recent Labs  Lab 02/17/22 1306 02/17/22 1557 02/18/22 0206 02/19/22 0203  WBC 16.3* 16.3* 13.3* 10.7*  CREATININE 0.92 0.82 0.71  --   LATICACIDVEN 2.5* 1.6  --   --     Estimated Creatinine Clearance: 31.7 mL/min (by C-G formula based on SCr of 0.71 mg/dL).    Allergies  Allergen Reactions   Ace Inhibitors Cough   Bacitracin-Polymyxin B     Other reaction(s): Unknown   Ciprofloxacin Diarrhea   Lidocaine Other (See Comments)    unknown   Neosporin [Neomycin-Bacitracin Zn-Polymyx] Other (See Comments)    unknown   Sulfa Antibiotics Other (See Comments)    unknown    Antimicrobials this admission: ceftriaxone 7/7 >> 7/8 Cefepime 7/9 >>   Dose adjustments this admission: N/A  Microbiology results: 7/7 UCx: >100,000/mL pseudomonas aeruginosa    Thank you for allowing pharmacy to be a part of this patient's care.  9/7, PharmD PGY1 Pharmacy Resident   02/19/2022  9:09 AM

## 2022-02-20 DIAGNOSIS — N390 Urinary tract infection, site not specified: Secondary | ICD-10-CM | POA: Diagnosis not present

## 2022-02-20 DIAGNOSIS — R531 Weakness: Secondary | ICD-10-CM | POA: Diagnosis not present

## 2022-02-20 DIAGNOSIS — R55 Syncope and collapse: Secondary | ICD-10-CM | POA: Diagnosis not present

## 2022-02-20 LAB — IRON AND TIBC
Iron: 24 ug/dL — ABNORMAL LOW (ref 28–170)
Saturation Ratios: 13 % (ref 10.4–31.8)
TIBC: 188 ug/dL — ABNORMAL LOW (ref 250–450)
UIBC: 164 ug/dL

## 2022-02-20 LAB — BASIC METABOLIC PANEL
Anion gap: 12 (ref 5–15)
BUN: 8 mg/dL (ref 8–23)
CO2: 23 mmol/L (ref 22–32)
Calcium: 8.5 mg/dL — ABNORMAL LOW (ref 8.9–10.3)
Chloride: 102 mmol/L (ref 98–111)
Creatinine, Ser: 0.66 mg/dL (ref 0.44–1.00)
GFR, Estimated: >60 mL/min (ref >60)
Glucose, Bld: 77 mg/dL (ref 70–99)
Potassium: 3.2 mmol/L — ABNORMAL LOW (ref 3.5–5.1)
Sodium: 137 mmol/L (ref 135–145)

## 2022-02-20 LAB — CBC
HCT: 29.7 % — ABNORMAL LOW (ref 36.0–46.0)
Hemoglobin: 10.2 g/dL — ABNORMAL LOW (ref 12.0–15.0)
MCH: 32.2 pg (ref 26.0–34.0)
MCHC: 34.3 g/dL (ref 30.0–36.0)
MCV: 93.7 fL (ref 80.0–100.0)
Platelets: 189 10*3/uL (ref 150–400)
RBC: 3.17 MIL/uL — ABNORMAL LOW (ref 3.87–5.11)
RDW: 14.6 % (ref 11.5–15.5)
WBC: 10.8 10*3/uL — ABNORMAL HIGH (ref 4.0–10.5)
nRBC: 0.2 % (ref 0.0–0.2)

## 2022-02-20 LAB — URINE CULTURE: Culture: >=100000 — AB

## 2022-02-20 LAB — FERRITIN: Ferritin: 215 ng/mL (ref 11–307)

## 2022-02-20 LAB — POTASSIUM: Potassium: 3.4 mmol/L — ABNORMAL LOW (ref 3.5–5.1)

## 2022-02-20 MED ORDER — ORAL CARE MOUTH RINSE
15.0000 mL | OROMUCOSAL | Status: DC | PRN
Start: 2022-02-20 — End: 2022-02-20

## 2022-02-20 MED ORDER — LEVOFLOXACIN 250 MG PO TABS: 250.0000 mg | ORAL_TABLET | Freq: Every day | ORAL | Status: DC

## 2022-02-20 MED ORDER — POTASSIUM CHLORIDE CRYS ER 20 MEQ PO TBCR
40.0000 meq | EXTENDED_RELEASE_TABLET | ORAL | Status: DC
  Administered 2022-02-20: 40 meq via ORAL
  Filled 2022-02-20: qty 2

## 2022-02-20 MED ORDER — HYDRALAZINE HCL 25 MG PO TABS
25.0000 mg | ORAL_TABLET | Freq: Four times a day (QID) | ORAL | Status: DC | PRN
  Administered 2022-02-20: 25 mg via ORAL
  Filled 2022-02-20: qty 1

## 2022-02-20 MED ORDER — LEVOFLOXACIN 250 MG PO TABS: 250.0000 mg | ORAL_TABLET | Freq: Every day | ORAL | 0 refills | Status: AC

## 2022-02-20 MED ORDER — BISACODYL 10 MG RE SUPP
10.0000 mg | Freq: Once | RECTAL | Status: AC
  Administered 2022-02-20: 10 mg via RECTAL
  Filled 2022-02-20: qty 1

## 2022-02-20 MED ORDER — POTASSIUM CHLORIDE CRYS ER 20 MEQ PO TBCR
20.0000 meq | EXTENDED_RELEASE_TABLET | Freq: Once | ORAL | Status: AC
  Administered 2022-02-20: 20 meq via ORAL
  Filled 2022-02-20: qty 1

## 2022-02-20 NOTE — TOC Progression Note (Signed)
Transition of Care [facility]) - Progression Note    Patient Details  Name: DEANA KROCK MRN: [id number] Date of Birth: Jun 26, 1925  [facility]) CM/SW Contact  Leone Haven, RN Phone Number: 02/20/2022, 11:07 AM  Clinical Narrative:    NCM spoke with patient and daughter at the bedside,  and spoke with the physical therapist.  Daughter states she would like to take patient home with [facility] services.  NCM offered choice, she states she does not have a preference.  NCM made referral to [facility] with [facility].  She is able to take referral.  Soc will begin 24 to 48 hrs post dc.  Daughter states she will need ambulance transport home today.     Expected Discharge Plan: Home w Home Health Services Barriers to Discharge: Continued Medical Work up  Expected Discharge Plan and Services Expected Discharge Plan: Home w Home Health Services In-house Referral: NA Discharge Planning Services: CM Consult Post Acute Care Choice: Home Health Living arrangements for the past 2 months: Single Family Home                   DME Agency: NA       HH Arranged: RN, PT, Disease Management, OT HH Agency: [facility] ([facility]) Date [facility] Agency Contacted: 02/20/22 Time HH Agency Contacted: [phone] Representative spoke with at [facility] Agency: Eber Jones   Social Determinants of Health (SDOH) Interventions    Readmission Risk Interventions     No data to display

## 2022-02-20 NOTE — TOC Transition Note (Signed)
Transition of Care [facility]) - CM/SW Discharge Note   Patient Details  Name: Anna Mccoy MRN: [id number] Date of Birth: 02/03/1925  Transition of Care [facility]) CM/SW Contact:  Leone Haven, RN Phone Number: 02/20/2022, 2:00 PM   Clinical Narrative:    Patient is for dc today, Eber Jones with [facility] is aware.  [facility] ambulance will transport home.    Final next level of care: Home w Home Health Services Barriers to Discharge: Continued Medical Work up   Patient Goals and CMS Choice Patient states their goals for this hospitalization and ongoing recovery are:: return home with [facility] CMS Medicare.gov Compare Post Acute Care list provided to:: Patient Represenative (must comment) Choice offered to / list presented to : Adult Children (daughter)  Discharge Placement                       Discharge Plan and Services In-house Referral: NA Discharge Planning Services: CM Consult Post Acute Care Choice: Home Health            DME Agency: NA       HH Arranged: RN, PT, Disease Management, OT HH Agency: [facility] ([facility]) Date [facility] Agency Contacted: 02/20/22 Time HH Agency Contacted: [phone] Representative spoke with at [facility] Agency: Eber Jones  Social Determinants of Health (SDOH) Interventions     Readmission Risk Interventions     No data to display

## 2022-02-20 NOTE — Discharge Summary (Signed)
Physician Discharge Summary   Patient: Anna Mccoy MRN: [id number] DOB: [birth year]  Admit date:     02/17/2022  Discharge date: 02/20/22  Discharge Physician: Meredeth Ide   PCP: [name], MD   Recommendations at discharge:     Discharge Diagnoses: Principal Problem:   Syncope UTI, catheter related HFpEF Hypertension Hypothyroidism Hyperlipidemia  Resolved Problems:   * No resolved hospital problems. *  Hospital Course:  88 y.o. female, with medical history of hypertension, remote arthritis, TIA, hypothyroidism, previous stroke was brought to the ED for generalized weakness.  Patient states that this morning she felt fine and felt weak after having a shower.  Her aide helped her tolerate when she was attempting to have bowel movement.  She was noted to get weak and was unable to get off the toilet.  She was unresponsive to verbal stimulation.  EMS was called.  Patient was found to be hypotensive with blood pressure 86/40 and bradycardia with heart rate in 40s.  Patient was given IV fluid bolus and blood pressure improved to 127/80.  She did not lose consciousness or fall.  Patient does have indwelling Foley catheter with cloudy urine.  Catheter was placed a year ago for urinary retention.  She denies vomiting but complains of nausea this morning. Denies chest pain or shortness of breath. Did not pass out but was dizzy sitting on the commode and was unresponsive. CT head was unremarkable. She was found to have abnormal UA, started on IV Rocephin.  Assessment and Plan:  Near syncope -Likely vasovagal; happened while sitting on commode -Echocardiogram done, showed EF of 60 to 65% -Orthostatics negative   Catheter related UTI -Patient has chronic Foley catheter -Was placed by urology due to chronic urinary retention -Found to have abnormal UA, started on IV Rocephin -Urine culture growing Pseudomonas aeruginosa -Antibiotics were changed to cefepime -We will  discharge on Levaquin to 50 mg p.o. daily for 3 more days   Chronic HFpEF -Appears euvolemic   Hypertension -Blood pressure was elevated -Started back on Coreg and amlodipine, Benicar  Hypokalemia -Potassium is 3.4 today -We will replace potassium with K-Dur 20 meq p.o. x1 before discharge    Hypothyroidism -Patient states that she takes Synthroid 100 mcg daily -We will start her back on Synthroid 100 mcg daily   Rheumatoid arthritis -Patient not taking Arava, will discontinue Arava at this time   Hyperlipidemia -Continue Lipitor  Family was offered to go to skilled nursing facility however they are chose to go home with home health PT.      Consultants:  Procedures performed:  Disposition: Home Diet recommendation:  Discharge Diet Orders (From admission, onward)     Start     Ordered   02/20/22 0000  Diet - low sodium heart healthy        02/20/22 1408           Cardiac diet DISCHARGE MEDICATION: Allergies as of 02/20/2022       Reactions   Ace Inhibitors Cough   Bacitracin-polymyxin B    Other reaction(s): Unknown   Ciprofloxacin Diarrhea   Lidocaine Other (See Comments)   unknown   Neosporin [neomycin-bacitracin Zn-polymyx] Other (See Comments)   unknown   Sulfa Antibiotics Other (See Comments)   unknown        Medication List     TAKE these medications    acetaminophen 500 MG tablet Commonly known as: TYLENOL Take 500 mg by mouth at bedtime.   amLODipine 2.5  MG tablet Commonly known as: NORVASC Take 2.5 mg by mouth daily.   aspirin 81 MG tablet Take 81 mg by mouth daily.   atorvastatin 20 MG tablet Commonly known as: LIPITOR Take 20 mg by mouth every evening.   bethanechol 10 MG tablet Commonly known as: Urecholine Take 1 tablet (10 mg total) by mouth 2 (two) times daily.   carvedilol 6.25 MG tablet Commonly known as: COREG Take 6.25 mg by mouth 2 (two) times daily.   COLACE PO Take 2 tablets by mouth at bedtime.    divalproex 250 MG DR tablet Commonly known as: DEPAKOTE Take 1 tablet (250 mg total) by mouth every 12 (twelve) hours.   Fluocinolone Acetonide Body 0.01 % Oil Apply 1 Application topically daily as needed (dry skin).   Fluocinolone Acetonide Scalp 0.01 % Oil Apply 1 Application topically daily as needed (dry scalp).   fluticasone 50 MCG/ACT nasal spray Commonly known as: FLONASE Place 1 spray into both nostrils daily.   folic acid 1 MG tablet Commonly known as: FOLVITE Take 1 mg by mouth daily.   furosemide 20 MG tablet Commonly known as: LASIX Take 20 mg by mouth every Monday, Wednesday, and Friday.   hydrocortisone 25 MG suppository Commonly known as: ANUSOL-HC Place 1 suppository (25 mg total) rectally 2 (two) times daily as needed for hemorrhoids or anal itching.   hypromellose 0.3 % Gel ophthalmic ointment Commonly known as: GENTEAL Place 1 application  into both eyes See admin instructions. Gel - Every day except the days using Tobradex   levofloxacin 250 MG tablet Commonly known as: LEVAQUIN Take 1 tablet (250 mg total) by mouth daily for 3 days. Start taking on: February 21, 2022   levothyroxine 100 MCG tablet Commonly known as: SYNTHROID Take 100 mcg by mouth daily.   olmesartan 40 MG tablet Commonly known as: BENICAR Take 40 mg by mouth daily.   oxybutynin 5 MG tablet Commonly known as: DITROPAN Take 1 tablet (5 mg total) by mouth every 8 (eight) hours as needed for bladder spasms (and foley leakage).   Poly-Iron 150 150 MG capsule Generic drug: iron polysaccharides Take 150 mg by mouth daily.   PRESERVISION AREDS PO Take 1 capsule by mouth 2 (two) times daily.   primidone 50 MG tablet Commonly known as: MYSOLINE Take 1 tablet by mouth daily.   TobraDex ophthalmic ointment Generic drug: tobramycin-dexamethasone Place 1 Application into both eyes See admin instructions. Once every 3 days   triamcinolone cream 0.1 % Commonly known as:  KENALOG Apply 1 application  topically 2 (two) times daily as needed (itching under breast).   trolamine salicylate 10 % cream Commonly known as: ASPERCREME Apply 1 Application topically daily as needed for muscle pain. To knees and right shoulder   UNABLE TO FIND Take 1 capsule by mouth daily. Ultra flora   vitamin D3 50 MCG (2000 UT) Caps Take 2,000 Units by mouth daily.        Follow-up Information     [name], MD. Go on 02/28/2022.   Specialty: Family Medicine Why: :20pm Contact information: [address] [place] [address] [phone]         Lars Masson, MD .   Specialty: Cardiology Contact information: [address] [place] [address] [phone]         Home, Medi Follow up.   Why: HHRN,HHPT,HHOT- Agency will call you to set up apt times Contact information: [address]  [place] [address] [phone]                Discharge Exam: Ceasar Mons Weights   02/18/22 0602 02/19/22 0605 02/20/22 0508  Weight: 23.1 kg 52 kg 54.7 kg   General-appears in no acute distress Heart-S1-S2, regular, no murmur auscultated Lungs-clear to auscultation bilaterally, no wheezing or crackles auscultated Abdomen-soft, nontender, no organomegaly Extremities-no edema in the lower extremities Neuro-alert, oriented x3, no focal deficit noted  Condition at discharge: good  The results of significant diagnostics from this hospitalization (including imaging, microbiology, ancillary and laboratory) are listed below for reference.   Imaging Studies: ECHOCARDIOGRAM COMPLETE  Result Date: 02/19/2022    ECHOCARDIOGRAM REPORT   Patient Name:   BERENIZE GATLIN Date of Exam: 02/19/2022 Medical Rec #:  [id number]          Height:       59.0 in Accession #:    [id number]         Weight:       114.7 lb Date of Birth:  21-Jun-1925           BSA:          1.456 m Patient Age:    89 years           BP:           140/79 mmHg Patient Gender: F                   HR:           752 bpm. Exam Location:  Inpatient Procedure: 2D Echo, Cardiac Doppler and Color Doppler Indications:    Syncope R55  History:        Patient has prior history of Echocardiogram examinations, most                 recent 11/11/2016. CHF, Stroke, Arrythmias:RBBB and PVC; Risk                 Factors:Hypertension.  Sonographer:    Leta Jungling RDCS Referring Phys: Sarina Ill Jaxten Brosh IMPRESSIONS  1. Left ventricular ejection fraction, by estimation, is 60 to 65%. The left ventricle has normal function. The left ventricle has no regional wall motion abnormalities. Left ventricular diastolic parameters are indeterminate.  2. Right ventricular systolic function is normal. The right ventricular size is normal. There is normal pulmonary artery systolic pressure.  3. The mitral valve is grossly normal. No evidence of mitral valve regurgitation. No evidence of mitral stenosis.  4. The aortic valve is grossly normal. Aortic valve regurgitation is not visualized. No aortic stenosis is present.  5. The inferior vena cava is normal in size with greater than 50% respiratory variability, suggesting right atrial pressure of 3 mmHg. FINDINGS  Left Ventricle: Left ventricular ejection fraction, by estimation, is 60 to 65%. The left ventricle has normal function. The left ventricle has no regional wall motion abnormalities. The left ventricular internal cavity size was normal in size. There is  no left ventricular hypertrophy. Left ventricular diastolic parameters are indeterminate. Right Ventricle: The right ventricular size is normal. No increase in right ventricular wall thickness. Right ventricular systolic function is normal. There is normal pulmonary artery systolic pressure. The tricuspid regurgitant velocity is 2.64 m/s, and  with an assumed right atrial pressure of 3 mmHg, the estimated right ventricular systolic pressure is 30.9 mmHg. Left Atrium: Left atrial size was normal in size. Right Atrium: Right  atrial size was normal in size. Pericardium: There  is no evidence of pericardial effusion. Mitral Valve: The mitral valve is grossly normal. No evidence of mitral valve regurgitation. No evidence of mitral valve stenosis. Tricuspid Valve: The tricuspid valve is grossly normal. Tricuspid valve regurgitation is mild . No evidence of tricuspid stenosis. Aortic Valve: The aortic valve is grossly normal. Aortic valve regurgitation is not visualized. No aortic stenosis is present. Pulmonic Valve: The pulmonic valve was grossly normal. Pulmonic valve regurgitation is trivial. No evidence of pulmonic stenosis. Aorta: The aortic root and ascending aorta are structurally normal, with no evidence of dilitation. Venous: The inferior vena cava is normal in size with greater than 50% respiratory variability, suggesting right atrial pressure of 3 mmHg. IAS/Shunts: The atrial septum is grossly normal.  LEFT VENTRICLE PLAX 2D LVIDd:         4.30 cm   Diastology LVIDs:         1.50 cm   LV e' medial:    4.20 cm/s LV PW:         0.70 cm   LV E/e' medial:  19.6 LV IVS:        0.50 cm   LV e' lateral:   5.03 cm/s LVOT diam:     1.90 cm   LV E/e' lateral: 16.4 LV SV:         45 LV SV Index:   31 LVOT Area:     2.84 cm  RIGHT VENTRICLE RV S prime:     8.33 cm/s TAPSE (M-mode): 0.9 cm LEFT ATRIUM             Index        RIGHT ATRIUM          Index LA diam:        2.30 cm 1.58 cm/m   RA Area:     7.88 cm LA Vol (A2C):   18.0 ml 12.36 ml/m  RA Volume:   12.70 ml 8.72 ml/m LA Vol (A4C):   21.6 ml 14.83 ml/m LA Biplane Vol: 21.4 ml 14.70 ml/m  AORTIC VALVE LVOT Vmax:   87.80 cm/s LVOT Vmean:  54.800 cm/s LVOT VTI:    0.158 m  AORTA Ao Root diam: 2.90 cm Ao Asc diam:  3.40 cm MITRAL VALVE                TRICUSPID VALVE MV Area (PHT): 3.01 cm     TR Peak grad:   27.9 mmHg MV Decel Time: 252 msec     TR Vmax:        264.00 cm/s MV E velocity: 82.30 cm/s MV A velocity: 105.00 cm/s  SHUNTS MV E/A ratio:  0.78         Systemic VTI:  0.16 m                              Systemic Diam: 1.90 cm Lennie Odor MD Electronically signed by Lennie Odor MD Signature Date/Time: 02/19/2022/1:21:47 PM    Final    CT Angio Chest PE W and/or Wo Contrast  Result Date: 02/17/2022 CLINICAL DATA:  PE suspected, elevated D-dimer EXAM: CT ANGIOGRAPHY CHEST WITH CONTRAST TECHNIQUE: Multidetector CT imaging of the chest was performed using the standard protocol during bolus administration of intravenous contrast. Multiplanar CT image reconstructions and MIPs were obtained to evaluate the vascular anatomy. RADIATION DOSE REDUCTION: This exam was performed according to the departmental dose-optimization program which includes automated exposure control, adjustment of  the mA and/or kV according to patient size and/or use of iterative reconstruction technique. CONTRAST:  60mL OMNIPAQUE IOHEXOL 350 MG/ML SOLN COMPARISON:  05/30/2020 FINDINGS: Cardiovascular: Satisfactory opacification of the pulmonary arteries to the segmental level. No evidence of pulmonary embolism. Normal heart size. Left coronary artery calcifications. No pericardial effusion. Aortic atherosclerosis. Mediastinum/Nodes: No enlarged mediastinal, hilar, or axillary lymph nodes. Thyroid gland, trachea, and esophagus demonstrate no significant findings. Lungs/Pleura: Scarring and or atelectasis of the bilateral lung bases, similar compared to prior examination (series 7, image 87). No pleural effusion or pneumothorax. Upper Abdomen: No acute abnormality.  Colonic diverticulosis. Musculoskeletal: No chest wall abnormality. No acute osseous findings. Review of the MIP images confirms the above findings. IMPRESSION: 1. Negative examination for pulmonary embolism. 2. Scarring and or atelectasis of the bilateral lung bases, similar compared to prior examination. No acute appearing airspace opacity. 3. Coronary artery disease. Aortic Atherosclerosis (ICD10-I70.0). Electronically Signed   By: Jearld Lesch M.D.   On:  02/17/2022 16:26   CT Head Wo Contrast  Result Date: 02/17/2022 CLINICAL DATA:  Mental status change. EXAM: CT HEAD WITHOUT CONTRAST TECHNIQUE: Contiguous axial images were obtained from the base of the skull through the vertex without intravenous contrast. RADIATION DOSE REDUCTION: This exam was performed according to the departmental dose-optimization program which includes automated exposure control, adjustment of the mA and/or kV according to patient size and/or use of iterative reconstruction technique. COMPARISON:  02/04/2015 MRI.  Head CT of 02/01/2015. FINDINGS: Brain: Advanced cerebral and cerebellar atrophy. Moderate low density in the periventricular white matter likely related to small vessel disease. No mass lesion, hemorrhage, hydrocephalus, acute infarct, intra-axial, or extra-axial fluid collection. Vascular: No hyperdense vessel or unexpected calcification. Skull: Normal Sinuses/Orbits: Surgical changes in both globes. Right maxillary sinus near-complete opacification by dominant mucous retention cyst or polyp. Right-sided mastoid effusion. Fluid in the right middle ear including on 25/4. Other: None. IMPRESSION: 1. No acute intracranial abnormality. 2. Cerebral atrophy and small vessel ischemic change. 3. Right mastoid and middle ear effusion. 4. Sinus disease. Electronically Signed   By: Jeronimo Greaves M.D.   On: 02/17/2022 14:12   DG Chest Portable 1 View  Result Date: 02/17/2022 CLINICAL DATA:  Unresponsive, bradycardia EXAM: PORTABLE CHEST 1 VIEW COMPARISON:  None Available. FINDINGS: Normal mediastinum and cardiac silhouette. Normal pulmonary vasculature. No evidence of effusion, infiltrate, or pneumothorax. No acute bony abnormality. IMPRESSION: No acute cardiopulmonary process. Electronically Signed   By: Genevive Bi M.D.   On: 02/17/2022 13:31    Microbiology: Results for orders placed or performed during the hospital encounter of 02/17/22  Urine Culture     Status: Abnormal    Collection Time: 02/17/22  1:06 PM   Specimen: Urine, Clean Catch  Result Value Ref Range Status   Specimen Description URINE, CLEAN CATCH  Final   Special Requests   Final    NONE Performed at [facility], [address] [place], [place] [address]    Culture >=100,000 COLONIES/mL PSEUDOMONAS AERUGINOSA (A)  Final   Report Status 02/20/2022 FINAL  Final   Organism ID, Bacteria PSEUDOMONAS AERUGINOSA (A)  Final      Susceptibility   Pseudomonas aeruginosa - MIC*    CEFTAZIDIME 4 SENSITIVE Sensitive     CIPROFLOXACIN <=0.25 SENSITIVE Sensitive     GENTAMICIN 4 SENSITIVE Sensitive     IMIPENEM 2 SENSITIVE Sensitive     PIP/TAZO <=4 SENSITIVE Sensitive     CEFEPIME 2 SENSITIVE Sensitive     * >=  100,000 COLONIES/mL PSEUDOMONAS AERUGINOSA    Labs: CBC: Recent Labs  Lab 02/17/22 1306 02/17/22 1557 02/18/22 0206 02/19/22 0203 02/20/22 0339  WBC 16.3* 16.3* 13.3* 10.7* 10.8*  NEUTROABS 10.6*  --   --   --   --   HGB 11.1* 10.4* 10.1* 9.9* 10.2*  HCT 33.6* 32.5* 30.5* 28.8* 29.7*  MCV 97.4 98.8 95.3 94.1 93.7  PLT 201 189 190 186 189   Basic Metabolic Panel: Recent Labs  Lab 02/17/22 1306 02/17/22 1557 02/18/22 0206 02/20/22 0339 02/20/22 1203  NA 135  --  137 137  --   K 3.7  --  3.5 3.2* 3.4*  CL 100  --  104 102  --   CO2 22  --  22 23  --   GLUCOSE 101*  --  78 77  --   BUN 10  --  7* 8  --   CREATININE 0.92 0.82 0.71 0.66  --   CALCIUM 8.6*  --  8.6* 8.5*  --    Liver Function Tests: Recent Labs  Lab 02/17/22 1306 02/18/22 0206  AST 22 23  ALT 14 14  ALKPHOS 55 47  BILITOT 0.3 0.7  PROT 5.9* 5.6*  ALBUMIN 2.6* 2.4*   CBG: Recent Labs  Lab 02/17/22 1332  GLUCAP 100*    Discharge time spent: greater than 30 minutes.  Signed: Meredeth Ide, MD [facility] 02/20/2022

## 2022-02-20 NOTE — Evaluation (Signed)
Physical Therapy Evaluation Patient Details Name: Anna Mccoy MRN: [id number] DOB: 09/21/1924 Today's Date: 02/20/2022  History of Present Illness  Pt is 88 yo female who presented on 02/17/22 with weakness and decreased consciousness while at home on toilet. Pt hypotensive and bradycardic upon arrival. Pt with catheter related UTI.  PMH: RA, HTN, TIA, hypothyroidism, CVA.  Clinical Impression  Pt admitted with above diagnosis. Pt received in bed, she reports R posterior knee pain that was not present before admission. Pt with tightness and mild swelling R hamstring. No redness or warmth noted. Pt thinks she can get up and ambulate and reports that at home she could ambulate down hall with her RW. However, today, pt was unable to achieve full standing even with max A and remained flexed at the trunk and knees. She was unable to step her feet in standing to be able to ambulate and needed max A to pivot to recliner. Recommending SNF at this time due to current functional status but if family able to provide max A she could go home and receive HHPT.  Pt currently with functional limitations due to the deficits listed below (see PT Problem List). Pt will benefit from skilled PT to increase their independence and safety with mobility to allow discharge to the venue listed below.          Recommendations for follow up therapy are one component of a multi-disciplinary discharge planning process, led by the attending physician.  Recommendations may be updated based on patient status, additional functional criteria and insurance authorization.  Follow Up Recommendations Skilled nursing-short term rehab (<3 hours/day) Can patient physically be transported by private vehicle: No    Assistance Recommended at Discharge Frequent or constant Supervision/Assistance  Patient can return home with the following  A lot of help with walking and/or transfers;A lot of help with bathing/dressing/bathroom;Assistance  with cooking/housework;Direct supervision/assist for medications management;Direct supervision/assist for financial management;Assist for transportation;Help with stairs or ramp for entrance    Equipment Recommendations None recommended by PT  Recommendations for Other Services  OT consult    Functional Status Assessment Patient has had a recent decline in their functional status and demonstrates the ability to make significant improvements in function in a reasonable and predictable amount of time.     Precautions / Restrictions Precautions Precautions: Fall Restrictions Weight Bearing Restrictions: No      Mobility  Bed Mobility Overal bed mobility: Needs Assistance Bed Mobility: Supine to Sit     Supine to sit: Mod assist, HOB elevated     General bed mobility comments: pt needed assist moving RLE to EOB as well as vc's for use of rail and mod A at hips to scoot to EOB    Transfers Overall transfer level: Needs assistance Equipment used: Rolling walker (2 wheels) Transfers: Sit to/from Stand, Bed to chair/wheelchair/BSC Sit to Stand: Max assist Stand pivot transfers: Max assist         General transfer comment: pt needed max A to stand from bed and recliner. Multiple bouts with no ability to get to full standing. Pt remained flexed at trunk and knees. Max A to pivot to chair from bed, unable to step feet in standing. Posterior lean throughout    Ambulation/Gait               General Gait Details: unable  Stairs            Wheelchair Mobility    Modified Rankin (Stroke Patients Only)  Balance Overall balance assessment: Needs assistance Sitting-balance support: Feet supported, Single extremity supported Sitting balance-Leahy Scale: Fair   Postural control: Posterior lean Standing balance support: Bilateral upper extremity supported Standing balance-Leahy Scale: Zero Standing balance comment: needs max A to maintain standing                              Pertinent Vitals/Pain Pain Assessment Pain Assessment: Faces Faces Pain Scale: Hurts even more Pain Location: R posterior knee Pain Descriptors / Indicators: Tightness, Sore Pain Intervention(s): Limited activity within patient's tolerance, Monitored during session    Home Living Family/patient expects to be discharged to:: Private residence Living Arrangements: Children Available Help at Discharge: Family;Available 24 hours/day;Personal care attendant Type of Home: House Home Access: Stairs to enter Entrance Stairs-Rails: Right;Left;Can reach both Entrance Stairs-Number of Steps: 4   Home Layout: One level Home Equipment: Rolling Walker (2 wheels);BSC/3in1;Grab bars - toilet;Grab bars - tub/shower;Tub bench;Wheelchair - manual Additional Comments: pt lives with 2 daughters as well as having a caregiver come 4 days/wk    Prior Function Prior Level of Function : Needs assist             Mobility Comments: ambulates with RW, she reports she could go about 10' at a time with someone beside her, though she also reported that sometimes her daughters push her to the bathroom in her w/c ADLs Comments: needs assist with bathing, dressing, and IADL's     Hand Dominance        Extremity/Trunk Assessment   Upper Extremity Assessment Upper Extremity Assessment: Generalized weakness    Lower Extremity Assessment Lower Extremity Assessment: Generalized weakness;RLE deficits/detail RLE Deficits / Details: R knee stiff with mild swelling posterior knee, not tender to touch or warm but pt reports posterior pain with extension and extension is limited . Hip flex 2/5, knee ext 2/5 RLE Sensation: WNL RLE Coordination: decreased gross motor    Cervical / Trunk Assessment Cervical / Trunk Assessment: Kyphotic  Communication   Communication: HOH  Cognition Arousal/Alertness: Awake/alert Behavior During Therapy: WFL for tasks assessed/performed Overall  Cognitive Status: No family/caregiver present to determine baseline cognitive functioning                                 General Comments: pt seems like reliable historian but no family present to verify. Pt follows all commands. Does have decreased insight into deficits        General Comments General comments (skin integrity, edema, etc.): VSS on RA. Pt left in chair with chair alarm pad on. LE's down for her to work on knee ROM    Exercises General Exercises - Lower Extremity Ankle Circles/Pumps: AROM, Both, 10 reps, Seated Long Arc Quad: AROM, Both, 10 reps, Seated   Assessment/Plan    PT Assessment Patient needs continued PT services  PT Problem List Decreased strength;Decreased range of motion;Decreased activity tolerance;Decreased balance;Decreased mobility;Decreased knowledge of precautions;Pain       PT Treatment Interventions DME instruction;Gait training;Stair training;Functional mobility training;Therapeutic activities;Therapeutic exercise;Balance training;Patient/family education    PT Goals (Current goals can be found in the Care Plan section)  Acute Rehab PT Goals Patient Stated Goal: return home PT Goal Formulation: With patient Time For Goal Achievement: 03/06/22 Potential to Achieve Goals: Good    Frequency Min 3X/week     Co-evaluation  AM-PAC PT "6 Clicks" Mobility  Outcome Measure Help needed turning from your back to your side while in a flat bed without using bedrails?: A Little Help needed moving from lying on your back to sitting on the side of a flat bed without using bedrails?: A Lot Help needed moving to and from a bed to a chair (including a wheelchair)?: A Lot Help needed standing up from a chair using your arms (e.g., wheelchair or bedside chair)?: Total Help needed to walk in hospital room?: Total Help needed climbing 3-5 steps with a railing? : Total 6 Click Score: 10    End of Session Equipment  Utilized During Treatment: Gait belt Activity Tolerance: Patient limited by pain;Patient limited by fatigue Patient left: in chair;with call bell/phone within reach;with chair alarm set Nurse Communication: Mobility status PT Visit Diagnosis: Unsteadiness on feet (R26.81);Muscle weakness (generalized) (M62.81);Difficulty in walking, not elsewhere classified (R26.2);Pain Pain - Right/Left: Right Pain - part of body: Knee    Time: 6015-6153 PT Time Calculation (min) (ACUTE ONLY): 40 min   Charges:   PT Evaluation $PT Eval Moderate Complexity: 1 Mod PT Treatments $Therapeutic Activity: 23-37 mins        Lyanne Co, PT  Acute Rehab Services Secure chat preferred Office [phone]   Lawana Chambers Zamariya Neal 02/20/2022, 11:54 AM

## 2022-02-20 NOTE — Progress Notes (Signed)
Pharmacy Antibiotic Note  Anna Mccoy is a 88 y.o. female admitted on 02/17/2022 with pseudomonas UTI.  Pharmacy has been consulted for ciprofloxacin dosing. Discussed levofloxacin instead for once daily dosing, TRH agreed and plan to treat for 5 days.  ClCr 62ml/min borderline. Received cefepime 7/10, will start levofloxacin 7/11.   Plan:  Stop cefepime  Start levofloxacin 250mg  PO daily 7/11- 7/13    Height: 4\' 11"  (149.9 cm) Weight: 54.7 kg (120 lb 9.5 oz) IBW/kg (Calculated) : 43.2  Temp (24hrs), Avg:98.9 F (37.2 C), Min:98.4 F (36.9 C), Max:99.2 F (37.3 C)  Recent Labs  Lab 02/17/22 1306 02/17/22 1557 02/18/22 0206 02/19/22 0203 02/20/22 0339  WBC 16.3* 16.3* 13.3* 10.7* 10.8*  CREATININE 0.92 0.82 0.71  --  0.66  LATICACIDVEN 2.5* 1.6  --   --   --     Estimated Creatinine Clearance: 32.4 mL/min (by C-G formula based on SCr of 0.66 mg/dL).    Allergies  Allergen Reactions   Ace Inhibitors Cough   Bacitracin-Polymyxin B     Other reaction(s): Unknown   Ciprofloxacin Diarrhea   Lidocaine Other (See Comments)    unknown   Neosporin [Neomycin-Bacitracin Zn-Polymyx] Other (See Comments)    unknown   Sulfa Antibiotics Other (See Comments)    unknown    Antimicrobials this admission: CTX 7/7 >7/8 Cefepime 7/9 >> 7/10 Levoflox 7/11 >> 7/13  Microbiology 7/7 Ucx - pseudomonas aeruginosa (Pan S)  Thank you for allowing pharmacy to be a part of this patient's care.  8/13, PharmD, BCPS, BCCP Clinical Pharmacist  Please check AMION for all [facility] Pharmacy phone numbers After 10:00 PM, call Main Pharmacy [phone]

## 2022-02-22 ENCOUNTER — Emergency Department (HOSPITAL_BASED_OUTPATIENT_CLINIC_OR_DEPARTMENT_OTHER): Payer: Medicare Other

## 2022-02-22 ENCOUNTER — Encounter (HOSPITAL_BASED_OUTPATIENT_CLINIC_OR_DEPARTMENT_OTHER): Payer: Self-pay

## 2022-02-22 ENCOUNTER — Other Ambulatory Visit: Payer: Self-pay

## 2022-02-22 ENCOUNTER — Emergency Department (HOSPITAL_BASED_OUTPATIENT_CLINIC_OR_DEPARTMENT_OTHER): Payer: Medicare Other | Admitting: Radiology

## 2022-02-22 ENCOUNTER — Emergency Department (HOSPITAL_BASED_OUTPATIENT_CLINIC_OR_DEPARTMENT_OTHER)
Admission: EM | Admit: 2022-02-22 | Discharge: 2022-02-23 | Disposition: A | Discharge status: Home / Self Care | Payer: Medicare Other | Attending: Emergency Medicine | Admitting: Emergency Medicine

## 2022-02-22 DIAGNOSIS — Z7982 Long term (current) use of aspirin: Secondary | ICD-10-CM | POA: Insufficient documentation

## 2022-02-22 DIAGNOSIS — M79661 Pain in right lower leg: Secondary | ICD-10-CM | POA: Insufficient documentation

## 2022-02-22 DIAGNOSIS — Z20822 Contact with and (suspected) exposure to covid-19: Secondary | ICD-10-CM | POA: Diagnosis not present

## 2022-02-22 DIAGNOSIS — E039 Hypothyroidism, unspecified: Secondary | ICD-10-CM | POA: Diagnosis not present

## 2022-02-22 DIAGNOSIS — I10 Essential (primary) hypertension: Secondary | ICD-10-CM | POA: Insufficient documentation

## 2022-02-22 DIAGNOSIS — R052 Subacute cough: Secondary | ICD-10-CM | POA: Diagnosis not present

## 2022-02-22 DIAGNOSIS — Z79899 Other long term (current) drug therapy: Secondary | ICD-10-CM | POA: Diagnosis not present

## 2022-02-22 DIAGNOSIS — M79604 Pain in right leg: Secondary | ICD-10-CM | POA: Diagnosis present

## 2022-02-22 DIAGNOSIS — M79605 Pain in left leg: Secondary | ICD-10-CM

## 2022-02-22 LAB — I-STAT VENOUS BLOOD GAS, ED
Acid-Base Excess: 3 mmol/L — ABNORMAL HIGH (ref 0.0–2.0)
Bicarbonate: 28.6 mmol/L — ABNORMAL HIGH (ref 20.0–28.0)
Calcium, Ion: 1.28 mmol/L (ref 1.15–1.40)
HCT: 37 % (ref 36.0–46.0)
Hemoglobin: 12.6 g/dL (ref 12.0–15.0)
O2 Saturation: 29 %
Patient temperature: 98.3
Potassium: 3.9 mmol/L (ref 3.5–5.1)
Sodium: 136 mmol/L (ref 135–145)
TCO2: 30 mmol/L (ref 22–32)
pCO2, Ven: 47 mmHg (ref 44–60)
pH, Ven: 7.391 (ref 7.25–7.43)
pO2, Ven: 19 mmHg — CL (ref 32–45)

## 2022-02-22 LAB — CBC WITH DIFFERENTIAL/PLATELET
Abs Immature Granulocytes: 0.12 10*3/uL — ABNORMAL HIGH (ref 0.00–0.07)
Basophils Absolute: 0 10*3/uL (ref 0.0–0.1)
Basophils Relative: 0 %
Eosinophils Absolute: 0.7 10*3/uL — ABNORMAL HIGH (ref 0.0–0.5)
Eosinophils Relative: 5 %
HCT: 36.1 % (ref 36.0–46.0)
Hemoglobin: 12 g/dL (ref 12.0–15.0)
Immature Granulocytes: 1 %
Lymphocytes Relative: 28 %
Lymphs Abs: 3.7 10*3/uL (ref 0.7–4.0)
MCH: 32 pg (ref 26.0–34.0)
MCHC: 33.2 g/dL (ref 30.0–36.0)
MCV: 96.3 fL (ref 80.0–100.0)
Monocytes Absolute: 1.8 10*3/uL — ABNORMAL HIGH (ref 0.1–1.0)
Monocytes Relative: 13 %
Neutro Abs: 6.8 10*3/uL (ref 1.7–7.7)
Neutrophils Relative %: 53 %
Platelets: 237 10*3/uL (ref 150–400)
RBC: 3.75 MIL/uL — ABNORMAL LOW (ref 3.87–5.11)
RDW: 14.8 % (ref 11.5–15.5)
WBC: 13 10*3/uL — ABNORMAL HIGH (ref 4.0–10.5)
nRBC: 0 % (ref 0.0–0.2)

## 2022-02-22 LAB — RESP PANEL BY RT-PCR (FLU A&B, COVID) ARPGX2
Influenza A by PCR: NEGATIVE
Influenza B by PCR: NEGATIVE
SARS Coronavirus 2 by RT PCR: NEGATIVE

## 2022-02-22 LAB — BRAIN NATRIURETIC PEPTIDE: B Natriuretic Peptide: 82.4 pg/mL (ref 0.0–100.0)

## 2022-02-22 NOTE — ED Provider Notes (Signed)
[facility] EMERGENCY DEPT Provider Note   CSN: [id number] Arrival date & time: 02/22/22  1559     History  Chief Complaint  Patient presents with   Cough    Anna Mccoy is a 88 y.o. female.  HPI     88 y.o. female, with medical history of hypertension, remote arthritis, TIA, hypothyroidism, previous stroke was brought to the ED with chief complaint of right leg pain and cough.  Patient was recently admitted to the hospital for uti.  After discharge, she started having some cough.  Cough is mostly nonproductive.  She also has been having some pain in her right leg.  No history of DVT, PE.  Patient denies any orthopnea, paroxysmal fevers, chills.  Home Medications Prior to Admission medications   Medication Sig Start Date End Date Taking? Authorizing Provider  acetaminophen (TYLENOL) 500 MG tablet Take 500 mg by mouth at bedtime.    [provider]  amLODipine (NORVASC) 2.5 MG tablet Take 2.5 mg by mouth daily.    [provider]  aspirin 81 MG tablet Take 81 mg by mouth daily.     [provider]  atorvastatin (LIPITOR) 20 MG tablet Take 20 mg by mouth every evening.     [provider]  bethanechol (URECHOLINE) 10 MG tablet Take 1 tablet (10 mg total) by mouth 2 (two) times daily. Patient not taking: Reported on 11/29/2021 05/21/20   Marcine Matar, MD  carvedilol (COREG) 6.25 MG tablet Take 6.25 mg by mouth 2 (two) times daily. 11/18/21   [provider]  Cholecalciferol (VITAMIN D3) 50 MCG (2000 UT) CAPS Take 2,000 Units by mouth daily.    [provider]  divalproex (DEPAKOTE) 250 MG DR tablet Take 1 tablet (250 mg total) by mouth every 12 (twelve) hours. 02/05/15   Rhetta Mura, MD  Docusate Sodium (COLACE PO) Take 2 tablets by mouth at bedtime.    [provider]  Fluocinolone Acetonide Body 0.01 % OIL Apply 1 Application topically daily as needed (dry skin). 09/01/21   [provider]  Fluocinolone Acetonide Scalp 0.01 % OIL Apply 1 Application topically daily as needed (dry scalp). 10/31/21   [provider]  fluticasone (FLONASE) 50 MCG/ACT nasal spray Place 1 spray into both nostrils daily.  01/14/18   [provider]  folic acid (FOLVITE) 1 MG tablet Take 1 mg by mouth daily.    [provider]  furosemide (LASIX) 20 MG tablet Take 20 mg by mouth every Monday, Wednesday, and Friday. 10/20/16   [provider]  hydrocortisone (ANUSOL-HC) 25 MG suppository Place 1 suppository (25 mg total) rectally 2 (two) times daily as needed for hemorrhoids or anal itching. Patient not taking: Reported on 11/29/2021 01/21/21   Long, Arlyss Repress, MD  hypromellose (GENTEAL) 0.3 % GEL ophthalmic ointment Place 1 application  into both eyes See admin instructions. Gel - Every day except the days using Tobradex    [provider]  levofloxacin (LEVAQUIN) 250 MG tablet Take 1 tablet (250 mg total) by mouth daily for 3 days. 02/21/22 02/24/22  Meredeth Ide, MD  levothyroxine (SYNTHROID) 100 MCG tablet Take 100 mcg by mouth daily. 09/29/21   [provider]  Multiple Vitamins-Minerals (PRESERVISION AREDS PO) Take 1 capsule by mouth 2 (two) times daily.    [provider]  olmesartan (BENICAR) 40 MG tablet Take 40 mg by mouth daily. 11/18/21   [provider]  oxybutynin (DITROPAN) 5 MG tablet Take  1 tablet (5 mg total) by mouth every 8 (eight) hours as needed for bladder spasms (and foley leakage). Patient not taking: Reported on 02/17/2022 11/23/21   Summerlin, Regan Rakers, PA-C  POLY-IRON 150 150 MG capsule Take 150 mg by mouth daily.  10/13/15   [provider]  primidone (MYSOLINE) 50 MG tablet Take 1 tablet by mouth daily.  09/20/16   [provider]  TOBRADEX ophthalmic ointment Place 1 Application into both eyes See admin instructions. Once every 3 days 11/22/21   [provider]  triamcinolone  cream (KENALOG) 0.1 % Apply 1 application  topically 2 (two) times daily as needed (itching under breast). 02/07/20   [provider]  trolamine salicylate (ASPERCREME) 10 % cream Apply 1 Application topically daily as needed for muscle pain. To knees and right shoulder    [provider]  UNABLE TO FIND Take 1 capsule by mouth daily. Ultra flora     [provider]      Allergies    Ace inhibitors, Bacitracin-polymyxin b, Ciprofloxacin, Lidocaine, Neosporin [neomycin-bacitracin zn-polymyx], and Sulfa antibiotics    Review of Systems   Review of Systems  All other systems reviewed and are negative.   Physical Exam Updated Vital Signs BP (!) 162/81   Pulse 72   Temp 98.3 F (36.8 C) (Oral)   Resp (!) 23   Ht 4\' 11"  (1.499 m)   Wt 54.7 kg   LMP 08/14/1981 (Approximate)   SpO2 97%   BMI 24.36 kg/m  Physical Exam Vitals and nursing note reviewed.  Constitutional:      Appearance: She is well-developed.  HENT:     Head: Atraumatic.  Cardiovascular:     Rate and Rhythm: Normal rate.  Pulmonary:     Effort: Pulmonary effort is normal.     Breath sounds: No wheezing, rhonchi or rales.  Musculoskeletal:     Cervical back: Normal range of motion and neck supple.     Right lower leg: No edema.     Left lower leg: No edema.     Comments: Right calf tenderness, 2+ DP right side  Skin:    General: Skin is warm and dry.  Neurological:     Mental Status: She is alert and oriented to person, place, and time.     ED Results / Procedures / Treatments   Labs (all labs ordered are listed, but only abnormal results are displayed) Labs Reviewed  CBC WITH DIFFERENTIAL/PLATELET - Abnormal; Notable for the following components:      Result Value   WBC 13.0 (*)    RBC 3.75 (*)    Monocytes Absolute 1.8 (*)    Eosinophils Absolute 0.7 (*)    Abs Immature Granulocytes 0.12 (*)    All other components within normal limits  I-STAT VENOUS BLOOD GAS, ED -  Abnormal; Notable for the following components:   pO2, Ven 19 (*)    Bicarbonate 28.6 (*)    Acid-Base Excess 3.0 (*)    All other components within normal limits  RESP PANEL BY RT-PCR (FLU A&B, COVID) ARPGX2  BRAIN NATRIURETIC PEPTIDE  BLOOD GAS, VENOUS  COMPREHENSIVE METABOLIC PANEL    EKG None  Radiology 10/12/1981 Venous Img Lower Right (DVT Study)  Result Date: 02/22/2022 CLINICAL DATA:  Right calf pain EXAM: RIGHT LOWER EXTREMITY VENOUS DOPPLER ULTRASOUND TECHNIQUE: Gray-scale sonography with compression, as well as color and duplex ultrasound, were performed to evaluate the deep venous system(s) from the level of the common  femoral vein through the popliteal and proximal calf veins. COMPARISON:  None Available. FINDINGS: VENOUS Normal compressibility of the common femoral, superficial femoral, and popliteal veins, as well as the visualized calf veins. Visualized portions of profunda femoral vein and great saphenous vein unremarkable. No filling defects to suggest DVT on grayscale or color Doppler imaging. Doppler waveforms show normal direction of venous flow, normal respiratory plasticity and response to augmentation. Limited views of the contralateral common femoral vein are unremarkable. OTHER None. Limitations: none IMPRESSION: Negative. Electronically Signed   By: Charlett Nose M.D.   On: 02/22/2022 23:05   DG Chest 2 View  Result Date: 02/22/2022 CLINICAL DATA:  Productive cough. EXAM: CHEST - 2 VIEW COMPARISON:  02/17/2022 chest x-ray FINDINGS: The heart size and mediastinal contours are within normal limits. Both lungs are clear. The visualized skeletal structures are unremarkable. IMPRESSION: No active cardiopulmonary disease. Electronically Signed   By: Darliss Cheney M.D.   On: 02/22/2022 17:59    Procedures Procedures    Medications Ordered in ED Medications - No data to display  ED Course/ Medical Decision Making/ A&P                           Medical Decision  Making Amount and/or Complexity of Data Reviewed Labs: ordered. Radiology: ordered.   This patient presents to the ED with chief complaint(s) of cough and right leg pain with pertinent past medical history of f hypertension, remote arthritis, TIA, hypothyroidism, previous stroke which further complicates the presenting complaint. The complaint involves an extensive differential diagnosis and also carries with it a high risk of complications and morbidity.    The differential diagnosis includes dvt, pna, pulm edenma, medication side effect.   The initial plan is to get basic labs, DVT study and CXR. Pt had a CT PE just last week and it was neg for PE, so doubt PE.   Additional history obtained: Additional history obtained from family Asked family about pt's mild tachypnea - and she states, along with the patient, that the breathing is normal. Records reviewed previous admission documents  Independent labs interpretation:  The following labs were independently interpreted:  normal cbc, bnp. No anemia.  Independent visualization of imaging: - I independently visualized the following imaging with scope of interpretation limited to determining acute life threatening conditions related to emergency care: chest xray, which revealed no pneumonia, pulm edema, pleural effusion  Treatment and Reassessment: Pt's workup is reassuring. The patient appears reasonably screened and/or stabilized for discharge and I doubt any other medical condition or other [facility] requiring further screening, evaluation, or treatment in the ED at this time prior to discharge.   Results from the ER workup discussed with the patient face to face and all questions answered to the best of my ability. The patient is safe for discharge with strict return precautions.  Final Clinical Impression(s) / ED Diagnoses Final diagnoses:  Pain of left lower extremity  Subacute cough    Rx / DC Orders ED Discharge Orders     None          Derwood Kaplan, MD 02/22/22 2322

## 2022-02-22 NOTE — ED Triage Notes (Signed)
Patient BIB [facility] from Home.  Endorses Recent Admission and Discharged from Hospital for UTI. Discharged Sunday and Daughter, at the Bedside, states she has had a Productive Cough Intermittently.   No New SOB. No New Fevers.   NAD Noted during Triage. A&Ox4. GCS 15. BIB Wheelchair/Stretcher.

## 2022-02-22 NOTE — Discharge Instructions (Signed)
The workup in the Er is reassuring. No signs of fluid build up in the ER. We also dont see any evidence of blood clot.  Please follow-up with your primary doctor.

## 2022-02-23 NOTE — ED Notes (Signed)
Pt's dtg called after pt was dc'd home and was complaining that EDP did not return and give an update prior to being discharged.  Informed her of all results, dc informatoin and questions were answered and dtg sts she will follow up with someone tomorrow.

## 2022-03-11 DIAGNOSIS — Z978 Presence of other specified devices: Secondary | ICD-10-CM | POA: Insufficient documentation

## 2022-04-04 ENCOUNTER — Ambulatory Visit (INDEPENDENT_AMBULATORY_CARE_PROVIDER_SITE_OTHER): Payer: Medicare Other | Admitting: Physician Assistant

## 2022-04-04 VITALS — BP 167/86 | HR 68

## 2022-04-04 DIAGNOSIS — Z87440 Personal history of urinary (tract) infections: Secondary | ICD-10-CM | POA: Diagnosis not present

## 2022-04-04 DIAGNOSIS — R339 Retention of urine, unspecified: Secondary | ICD-10-CM

## 2022-04-04 DIAGNOSIS — R8290 Unspecified abnormal findings in urine: Secondary | ICD-10-CM | POA: Diagnosis not present

## 2022-04-04 DIAGNOSIS — Z978 Presence of other specified devices: Secondary | ICD-10-CM | POA: Diagnosis not present

## 2022-04-04 NOTE — Progress Notes (Signed)
Assessment: 1. Urinary retention  2. Chronic indwelling Foley catheter  3. Bad odor of urine    Plan: Patient's family is reassured that antibiotics are contraindicated without other symptoms of illness and that change in urine odor can be from many things, not necessarily a UTI.  Patient's catheter was flushed and found to be draining well.  She will continue monthly catheter changes with home health and follow-up in October as scheduled.  Chief Complaint: No chief complaint on file.   HPI: Anna Mccoy is a 88 y.o. female with h/o chronic urinary retention who presents for evaluation of sudden onset of malodorous urine when urine bag was changed last night.  Most recent catheter change was in early August performed at home with [facility] agency. Pt continues to have fatigue and is working with therapy for strength and ambulation training.  She has had no recent fever, chills, abdominal or back pain.  No change in mental status or symptoms of confusion.  Since her last visit, pt was admitted on 7/10 for weakness and syncopal episode.  She was diagnosed with catheter related UTI and treated with IV Rocephin, then IV cefepime, then discharged on Levaquin 50 mg daily for 3 days.  Recommendation for skilled placement made at discharge and the patient's family elected to bring her home with home health services. Cx 02/20/22-Pseudomonas aeruginosa, greater than 100,000 colonies, pansensitive.   5.25.2021: referred for issues with incomplete bladder emptying. She states that she will now have a strong urge to urinate but find herself unable to void any significant amount of urine even when straining. Of note, she has a hx of bladder prolapse that was managed with pessary but she is no longer using this (she cannot recall why she is no longer using this). She also has a hx of urge incontinence for which she is wearing pads (1 QD). She has limited mobility but is able to navigate her house and make  it to the restroom without much issue with assistance of her walker. She has UUI. She has around 2-3 UTI's per year -- these are never symptomatic and are instead incidentally dx'd per UA. Her main concern today is this recent change in her ability to empty her bladder. On average, she finds her sx's quite bothersome (rating them an 8/10). Placed on bethanechol.   8.31.2021: She feels that bethanechol has alleviated her bladder retention. Pt must wear pads for urinary incontinence. Pt is pleased with her symptoms and submits no questions or concerns at this time.   8.23.2022: Recent visit to the emergency room on the 15th of this month for probable UTI.  Urine looked infected but culture was not performed.  Catheter was placed, her daughter thinks that 400 cc of urine was obtained upon catheterization.  She did have a CT scan for hematochezia back in June of this year.  Results as follows:   No acute intra-abdominal pathology identified. No definite radiographic explanation for the patient's hematochezia.   Severe pancolonic diverticulosis without superimposed inflammatory change.   Marked bladder distension with large urachal diverticulum noted. This diverticulum has enlarged in size since prior examination and may contribute to incomplete voiding and bladder dysfunction. No associated mass identified.   8.30.2022: At her visit last week she was given a voiding trial.  She came in a couple of days later with a residual urine volume of 600 mL.  Catheter was put back in.   4.18.2023: Here for routine check.  She has had  some issues with the catheter kinking and having some leakage from her bladder.  She was most recently in here a couple of weeks ago.  Home health care was called out last week and sent a urine culture.  She has not heard back yet.  The culture was sent although she was not having abdominal pain, fever or the urine was not looking any different.  Portions of the above  documentation were copied from a prior visit for review purposes only.  Allergies: Allergies  Allergen Reactions   Ace Inhibitors Cough   Bacitracin-Polymyxin B     Other reaction(s): Unknown   Ciprofloxacin Diarrhea   Lidocaine Other (See Comments)    unknown   Neosporin [Neomycin-Bacitracin Zn-Polymyx] Other (See Comments)    unknown   Sulfa Antibiotics Other (See Comments)    unknown    PMH: Past Medical History:  Diagnosis Date   Anemia    Arthritis    Cerebral infarction due to unspecified mechanism    Cholesterol retinal embolus of both eyes 09/28/2015   Chronic diarrhea of unknown origin 12/04/2015   Chronic diastolic CHF (congestive heart failure) (HCC)    CVA (cerebral infarction) 02/02/2015   DDD (degenerative disc disease), lumbar    Edema    Essential tremor 09/29/2015   Gait disturbance 06/06/2017   Hx-TIA (transient ischemic attack)    Hypertension    Hypertensive heart disease with heart failure (HCC) 01/13/2016   Hypertrophic toenail 09/28/2015   Hypothyroidism    HYPOTHYROIDISM   Impacted cerumen of right ear 01/10/2018   Kyphosis (acquired) (postural) 10/07/2016   Menopausal state age 84   Mitral valve regurgitation    TRIVIAL   Osteoporosis 09/28/2015   Peripheral neuropathy 09/28/2015   Post-menopausal bleeding 02/2011   Endo Biopsy 7/12 benign, no hyperplasia   Procidentia of uterus    uses pessary   PVC's (premature ventricular contractions)    RBBB    Rheumatoid arthritis(714.0)    Seizure cerebral (HCC)    Stroke (HCC) 1996   pt estimation   Sudden idiopathic hearing loss of right ear 01/18/2018   Syncope 11/10/2017   Vitamin D deficiency disease     PSH: Past Surgical History:  Procedure Laterality Date   BACK SURGERY     CHOLECYSTECTOMY, LAPAROSCOPIC  2002   COLONOSCOPY W/ BIOPSIES  6/09   sigmoid diverticuli recheck 5 years   EYE SURGERY  04/24/14   Cataract sx in right eye with lens replacement.   LAMINECTOMY AND MICRODISCECTOMY LUMBAR  SPINE  01/23/09   VAGINAL DELIVERY     x5    SH: Social History   Tobacco Use   Smoking status: Never   Smokeless tobacco: Never  Vaping Use   Vaping Use: Never used  Substance Use Topics   Alcohol use: No   Drug use: No    ROS: All other review of systems were reviewed and are negative except what is noted above in HPI  PE: BP (!) 167/86   Pulse 68   LMP 08/14/1981 (Approximate)  GENERAL APPEARANCE:  Pt appears thin and frail-in her usual state of health. In wheelchair today HEENT:  Atraumatic, normocephalic NECK:  Supple. Trachea midline ABDOMEN:  Soft, non-tender EXTREMITIES:  No edema noted NEUROLOGIC:  Alert and oriented x 3, HOH MENTAL STATUS:  appropriate BACK:  Non-tender to palpation, No CVAT SKIN:  Warm, dry, and intact   Results: Laboratory Data: Lab Results  Component Value Date   WBC 13.0 (H) 02/22/2022  HGB 12.6 02/22/2022   HCT 37.0 02/22/2022   MCV 96.3 02/22/2022   PLT 237 02/22/2022    Lab Results  Component Value Date   CREATININE 0.66 02/20/2022    No results found for: "PSA"  No results found for: "TESTOSTERONE"  Lab Results  Component Value Date   HGBA1C 5.5 02/03/2015    Urinalysis    Component Value Date/Time   COLORURINE YELLOW 02/17/2022 1304   APPEARANCEUR TURBID (A) 02/17/2022 1304   APPEARANCEUR Clear 04/13/2020 1524   LABSPEC 1.010 02/17/2022 1304   PHURINE 7.0 02/17/2022 1304   GLUCOSEU NEGATIVE 02/17/2022 1304   HGBUR MODERATE (A) 02/17/2022 1304   BILIRUBINUR NEGATIVE 02/17/2022 1304   BILIRUBINUR Negative 04/13/2020 1524   KETONESUR NEGATIVE 02/17/2022 1304   PROTEINUR 100 (A) 02/17/2022 1304   UROBILINOGEN negative (A) 01/06/2020 1049   UROBILINOGEN 1.0 02/04/2015 0539   NITRITE NEGATIVE 02/17/2022 1304   LEUKOCYTESUR LARGE (A) 02/17/2022 1304    Lab Results  Component Value Date   LABMICR See below: 04/13/2020   WBCUA >30 (A) 04/13/2020   LABEPIT >10 (A) 04/13/2020   BACTERIA MANY (A) 02/17/2022     Pertinent Imaging: No results found for this or any previous visit.  No results found for this or any previous visit.  No results found for this or any previous visit.  No results found for this or any previous visit.  Results for orders placed during the hospital encounter of 01/13/20  US RENAL  Narrative CLINICAL DATA:  Initial evaluation for hematuria.  EXAM: RENAL / URINARY TRACT ULTRASOUND COMPLETE  COMPARISON:  Prior CT from 07/06/2010.  FINDINGS: Right Kidney:  Renal measurements: 10.1 x 3.8 x 3.7 cm = volume: 74 mL. Echogenicity within normal limits. No nephrolithiasis or hydronephrosis. Prominent extrarenal pelvis noted. No focal renal mass.  Left Kidney:  Renal measurements: 10.5 x 3.7 x 3.7 cm = volume: 76.4 mL. Echogenicity within normal limits. No nephrolithiasis or hydronephrosis. No focal renal mass.  Bladder:  Probable diverticulum seen extending from the superior and right aspect of the bladder noted, also partially seen on prior CT from 2011. Partially distended bladder otherwise within normal limits.  Other:  None.  IMPRESSION: 1. Normal sonographic evaluation of the kidneys. No nephrolithiasis or hydronephrosis. 2. Bladder diverticulum extending from the superior aspect of the bladder, also seen on prior CT from 2011.   Electronically Signed By: Rise Mu M.D. On: 01/14/2020 01:07  No results found for this or any previous visit.  No results found for this or any previous visit.  No results found for this or any previous visit.  No results found for this or any previous visit (from the past 24 hour(s)).

## 2022-04-07 ENCOUNTER — Encounter (HOSPITAL_COMMUNITY): Payer: Self-pay | Admitting: Emergency Medicine

## 2022-04-07 ENCOUNTER — Emergency Department (HOSPITAL_COMMUNITY)
Admission: EM | Admit: 2022-04-07 | Discharge: 2022-04-07 | Disposition: A | Discharge status: Home / Self Care | Payer: Medicare Other | Attending: Emergency Medicine | Admitting: Emergency Medicine

## 2022-04-07 ENCOUNTER — Other Ambulatory Visit: Payer: Self-pay

## 2022-04-07 DIAGNOSIS — Z8673 Personal history of transient ischemic attack (TIA), and cerebral infarction without residual deficits: Secondary | ICD-10-CM | POA: Insufficient documentation

## 2022-04-07 DIAGNOSIS — Z7982 Long term (current) use of aspirin: Secondary | ICD-10-CM | POA: Insufficient documentation

## 2022-04-07 DIAGNOSIS — R030 Elevated blood-pressure reading, without diagnosis of hypertension: Secondary | ICD-10-CM | POA: Insufficient documentation

## 2022-04-07 DIAGNOSIS — I5032 Chronic diastolic (congestive) heart failure: Secondary | ICD-10-CM | POA: Diagnosis not present

## 2022-04-07 DIAGNOSIS — I110 Hypertensive heart disease with heart failure: Secondary | ICD-10-CM | POA: Insufficient documentation

## 2022-04-07 DIAGNOSIS — E039 Hypothyroidism, unspecified: Secondary | ICD-10-CM | POA: Diagnosis not present

## 2022-04-07 LAB — CBC WITH DIFFERENTIAL/PLATELET
Abs Immature Granulocytes: 0.04 10*3/uL (ref 0.00–0.07)
Basophils Absolute: 0 10*3/uL (ref 0.0–0.1)
Basophils Relative: 0 %
Eosinophils Absolute: 0.3 10*3/uL (ref 0.0–0.5)
Eosinophils Relative: 5 %
HCT: 35.4 % — ABNORMAL LOW (ref 36.0–46.0)
Hemoglobin: 11.8 g/dL — ABNORMAL LOW (ref 12.0–15.0)
Immature Granulocytes: 1 %
Lymphocytes Relative: 43 %
Lymphs Abs: 3.2 10*3/uL (ref 0.7–4.0)
MCH: 32.2 pg (ref 26.0–34.0)
MCHC: 33.3 g/dL (ref 30.0–36.0)
MCV: 96.7 fL (ref 80.0–100.0)
Monocytes Absolute: 0.9 10*3/uL (ref 0.1–1.0)
Monocytes Relative: 12 %
Neutro Abs: 2.8 10*3/uL (ref 1.7–7.7)
Neutrophils Relative %: 39 %
Platelets: 245 10*3/uL (ref 150–400)
RBC: 3.66 MIL/uL — ABNORMAL LOW (ref 3.87–5.11)
RDW: 14.4 % (ref 11.5–15.5)
WBC: 7.3 10*3/uL (ref 4.0–10.5)
nRBC: 0 % (ref 0.0–0.2)

## 2022-04-07 LAB — BASIC METABOLIC PANEL
Anion gap: 8 (ref 5–15)
BUN: 12 mg/dL (ref 8–23)
CO2: 28 mmol/L (ref 22–32)
Calcium: 9.2 mg/dL (ref 8.9–10.3)
Chloride: 101 mmol/L (ref 98–111)
Creatinine, Ser: 0.82 mg/dL (ref 0.44–1.00)
GFR, Estimated: >60 mL/min (ref >60)
Glucose, Bld: 89 mg/dL (ref 70–99)
Potassium: 3.8 mmol/L (ref 3.5–5.1)
Sodium: 137 mmol/L (ref 135–145)

## 2022-04-07 MED ORDER — AMLODIPINE BESYLATE 5 MG PO TABS
2.5000 mg | ORAL_TABLET | Freq: Once | ORAL | Status: AC
  Administered 2022-04-07: 2.5 mg via ORAL
  Filled 2022-04-07: qty 1

## 2022-04-07 NOTE — ED Provider Notes (Signed)
[facility] EMERGENCY DEPARTMENT Provider Note  CSN: [id number] Arrival date & time: 04/07/22 0012  Chief Complaint(s) Hypertension  HPI Anna Mccoy is a 88 y.o. female with a past medical history listed below including hypertension who presents to the emergency department with elevated blood pressures.  Reportedly patient's medications got mixed up and she missed her nighttime dose of amlodipine.  This prompted family to check her blood pressure and noted that it was elevated.  They called the on-call nurse who recommended calling EMS to have them check it.  EMS confirmed that blood pressures were elevated between 180s to 200s.  Patient reports being asymptomatic and denies any headache, chest pain, shortness of breath, nausea, vomiting, abdominal pain.  The history is provided by the patient and a relative.    Past Medical History Past Medical History:  Diagnosis Date   Anemia    Arthritis    Cerebral infarction due to unspecified mechanism    Cholesterol retinal embolus of both eyes 09/28/2015   Chronic diarrhea of unknown origin 12/04/2015   Chronic diastolic CHF (congestive heart failure) (HCC)    CVA (cerebral infarction) 02/02/2015   DDD (degenerative disc disease), lumbar    Edema    Essential tremor 09/29/2015   Gait disturbance 06/06/2017   Hx-TIA (transient ischemic attack)    Hypertension    Hypertensive heart disease with heart failure (HCC) 01/13/2016   Hypertrophic toenail 09/28/2015   Hypothyroidism    HYPOTHYROIDISM   Impacted cerumen of right ear 01/10/2018   Kyphosis (acquired) (postural) 10/07/2016   Menopausal state age 49   Mitral valve regurgitation    TRIVIAL   Osteoporosis 09/28/2015   Peripheral neuropathy 09/28/2015   Post-menopausal bleeding 02/2011   Endo Biopsy 7/12 benign, no hyperplasia   Procidentia of uterus    uses pessary   PVC's (premature ventricular contractions)    RBBB    Rheumatoid arthritis(714.0)    Seizure  cerebral (HCC)    Stroke (HCC) 1996   pt estimation   Sudden idiopathic hearing loss of right ear 01/18/2018   Syncope 11/10/2017   Vitamin D deficiency disease    Patient Active Problem List   Diagnosis Date Noted   Pure hypercholesterolemia 06/01/2019   Rectal bleeding 02/13/2018    Class: History of   Dysfunction of right eustachian tube 02/06/2018   Presbycusis of both ears 02/06/2018   Sudden idiopathic hearing loss of right ear 01/18/2018   Bilateral impacted cerumen 01/10/2018   Hospital discharge follow-up 12/02/2017   Other fatigue    Syncope 11/10/2017   Encounter for Medicare annual wellness exam 10/08/2017   Abnormal urinalysis 08/22/2017   Gait disturbance 06/06/2017   Frailty 10/07/2016   Kyphosis (acquired) (postural) 10/07/2016   Bilateral leg edema 01/13/2016   Hypertensive heart disease with heart failure (HCC) 01/13/2016   Chronic diastolic heart failure (HCC) 01/13/2016   Blurred vision, bilateral 12/31/2015   Bilateral dry eyes 12/31/2015   Chronic fatigue 12/15/2015   Chronic diarrhea of unknown origin 12/04/2015   Essential tremor 09/29/2015   Aphthous ulcer 09/28/2015   Cardiac arrhythmia 09/28/2015   Cataract 09/28/2015   Cholesterol retinal embolus of both eyes 09/28/2015   History of headache 09/28/2015   Hypertrophic toenail 09/28/2015   Hypokalemia 09/28/2015   Osteoporosis 09/28/2015   Peripheral neuropathy 09/28/2015   Seborrheic dermatitis 09/28/2015   Situational anxiety 09/28/2015   Vitamin D deficiency 09/28/2015   Weight loss 09/28/2015   History of stroke 09/28/2015   UTI (  lower urinary tract infection)    Seizure cerebral (HCC)    CVA (cerebral infarction) 02/02/2015   History of Stroke in 2016 without residual deficits (HCC) 02/02/2015   Rheumatoid arthritis (HCC) 02/02/2015   Headache 02/02/2015   Hypertension 02/02/2015   Hypothyroidism 02/02/2015   Cerebral infarction due to unspecified mechanism    Uterovaginal prolapse,  complete 04/18/2013   Complete uterovaginal prolapse 04/18/2013   Home Medication(s) Prior to Admission medications   Medication Sig Start Date End Date Taking? Authorizing Provider  acetaminophen (TYLENOL) 500 MG tablet Take 500 mg by mouth at bedtime.    [provider]  amLODipine (NORVASC) 2.5 MG tablet Take 2.5 mg by mouth daily.    [provider]  aspirin 81 MG tablet Take 81 mg by mouth daily.     [provider]  atorvastatin (LIPITOR) 20 MG tablet Take 20 mg by mouth every evening.     [provider]  bethanechol (URECHOLINE) 10 MG tablet Take 1 tablet (10 mg total) by mouth 2 (two) times daily. Patient not taking: Reported on 04/04/2022 05/21/20   Marcine Matar, MD  carvedilol (COREG) 6.25 MG tablet Take 6.25 mg by mouth 2 (two) times daily. 11/18/21   [provider]  Cholecalciferol (VITAMIN D3) 50 MCG (2000 UT) CAPS Take 2,000 Units by mouth daily.    [provider]  divalproex (DEPAKOTE) 250 MG DR tablet Take 1 tablet (250 mg total) by mouth every 12 (twelve) hours. 02/05/15   Rhetta Mura, MD  Docusate Sodium (COLACE PO) Take 2 tablets by mouth at bedtime.    [provider]  Fluocinolone Acetonide Body 0.01 % OIL Apply 1 Application topically daily as needed (dry skin). 09/01/21   [provider]  Fluocinolone Acetonide Scalp 0.01 % OIL Apply 1 Application topically daily as needed (dry scalp). 10/31/21   [provider]  fluticasone (FLONASE) 50 MCG/ACT nasal spray Place 1 spray into both nostrils daily.  01/14/18   [provider]  folic acid (FOLVITE) 1 MG tablet Take 1 mg by mouth daily.    [provider]  furosemide (LASIX) 20 MG tablet Take 20 mg by mouth every Monday, Wednesday, and Friday. 10/20/16   [provider]  hydrocortisone (ANUSOL-HC) 25 MG suppository Place 1 suppository (25 mg total) rectally 2 (two) times daily as needed for hemorrhoids or anal  itching. Patient not taking: Reported on 04/04/2022 01/21/21   Maia Plan, MD  hypromellose (GENTEAL) 0.3 % GEL ophthalmic ointment Place 1 application  into both eyes See admin instructions. Gel - Every day except the days using Tobradex    [provider]  levothyroxine (SYNTHROID) 100 MCG tablet Take 100 mcg by mouth daily. 09/29/21   [provider]  Multiple Vitamins-Minerals (PRESERVISION AREDS PO) Take 1 capsule by mouth 2 (two) times daily.    [provider]  olmesartan (BENICAR) 40 MG tablet Take 40 mg by mouth daily. 11/18/21   [provider]  oxybutynin (DITROPAN) 5 MG tablet Take 1 tablet (5 mg total) by mouth every 8 (eight) hours as needed for bladder spasms (and foley leakage). 11/23/21   [place]lin, Ofilia Neas Annette, PA-C  POLY-IRON 150 150 MG capsule Take 150 mg by mouth daily.  10/13/15   [provider]  primidone (MYSOLINE) 50 MG tablet Take 1 tablet by mouth daily.  09/20/16   [provider]  TOBRADEX ophthalmic ointment Place 1 Application into both eyes See admin instructions. Once every 3 days  11/22/21   [provider]  triamcinolone cream (KENALOG) 0.1 % Apply 1 application  topically 2 (two) times daily as needed (itching under breast). 02/07/20   [provider]  trolamine salicylate (ASPERCREME) 10 % cream Apply 1 Application topically daily as needed for muscle pain. To knees and right shoulder    [provider]  UNABLE TO FIND Take 1 capsule by mouth daily. Ultra flora     [provider]                                                                                                                                    Allergies Ace inhibitors, Bacitracin-polymyxin b, Ciprofloxacin, Lidocaine, Neosporin [neomycin-bacitracin zn-polymyx], and Sulfa antibiotics  Review of Systems Review of Systems As noted in HPI  Physical Exam Vital Signs  I have reviewed the triage vital signs BP  (!) 154/71   Pulse 64   Temp 98.2 F (36.8 C) (Oral)   Resp 17   LMP 08/14/1981 (Approximate)   SpO2 99%   Physical Exam Vitals reviewed.  Constitutional:      General: She is not in acute distress.    Appearance: She is well-developed. She is not diaphoretic.  HENT:     Head: Normocephalic and atraumatic.     Nose: Nose normal.  Eyes:     General: No scleral icterus.       Right eye: No discharge.        Left eye: No discharge.     Conjunctiva/sclera: Conjunctivae normal.     Pupils: Pupils are equal, round, and reactive to light.  Cardiovascular:     Rate and Rhythm: Normal rate and regular rhythm.     Heart sounds: No murmur heard.    No friction rub. No gallop.  Pulmonary:     Effort: Pulmonary effort is normal. No respiratory distress.     Breath sounds: Normal breath sounds. No stridor. No rales.  Abdominal:     General: There is no distension.     Palpations: Abdomen is soft.     Tenderness: There is no abdominal tenderness.  Musculoskeletal:        General: No tenderness.     Cervical back: Normal range of motion and neck supple.  Skin:    General: Skin is warm and dry.     Findings: No erythema or rash.  Neurological:     Mental Status: She is alert and oriented to person, place, and time.     ED Results and Treatments Labs (all labs ordered are listed, but only abnormal results are displayed) Labs Reviewed  CBC WITH DIFFERENTIAL/PLATELET - Abnormal; Notable for the following components:      Result Value   RBC 3.66 (*)    Hemoglobin 11.8 (*)    HCT 35.4 (*)    All other components within normal limits  BASIC METABOLIC PANEL  EKG  EKG Interpretation  Date/Time:    Ventricular Rate:    PR Interval:    QRS Duration:   QT Interval:    QTC Calculation:   R Axis:     Text Interpretation:         Radiology No results  found.  Medications Ordered in ED Medications  amLODipine (NORVASC) tablet 2.5 mg (2.5 mg Oral Given 04/07/22 0257)                                                                                                                                     Procedures Procedures  (including critical care time)  Medical Decision Making / ED Course   Medical Decision Making Amount and/or Complexity of Data Reviewed Labs: ordered.  Risk Prescription drug management.    Asymptomatic hypertension. Screening labs ordered grossly reassuring without leukocytosis.  Stable hemoglobin.  No significant electrolyte derangements or renal insufficiency.   Patient given dose of amlodipine. Blood pressure improved down to her baseline.       Final Clinical Impression(s) / ED Diagnoses Final diagnoses:  Elevated blood pressure reading   The patient appears reasonably screened and/or stabilized for discharge and I doubt any other medical condition or other [facility] requiring further screening, evaluation, or treatment in the ED at this time. I have discussed the findings, Dx and Tx plan with the patient/family who expressed understanding and agree(s) with the plan. Discharge instructions discussed at length. The patient/family was given strict return precautions who verbalized understanding of the instructions. No further questions at time of discharge.  Disposition: Discharge  Condition: Good  ED Discharge Orders     None        Follow Up: [name], MD [address] [place] [address] [phone]  Call  to schedule an appointment for close follow up           This chart was dictated using voice recognition software.  Despite best efforts to proofread,  errors can occur which can change the documentation meaning.    Nira Conn, MD 04/07/22 [phone]

## 2022-04-07 NOTE — ED Notes (Signed)
RN reviewed discharge instructions with pt. Pt verbalized understanding and had no further questions. VSS upon discharge.  

## 2022-04-07 NOTE — ED Triage Notes (Addendum)
Pt brought to ED by [facility] with c/o elevated BP. EMS reports that initial BP reading 200/110 on scene. Pt denies any complaints at this time. Daughter would like pt evaluated d/t accidentally administering pt atorvastatin and carvedilol this evening instead of carvedilol and amlodipine. Has foley for chronic urinary retention.   EMS Vitals BP 180/94 HR 74 SPO2 98% CBG 112

## 2022-05-10 ENCOUNTER — Encounter: Payer: Self-pay | Admitting: Urology

## 2022-05-10 ENCOUNTER — Telehealth: Payer: Self-pay

## 2022-05-10 NOTE — Telephone Encounter (Signed)
A nurse with [facility] called requesting verbal lorders to change patient's catheter while she is under the care of PT.  She will be under PT care until 10/18 and her next cath change is scheduled for 10/17.  They will then refer back to Urology to continue cath changes. Please advise.

## 2022-05-10 NOTE — Telephone Encounter (Signed)
A nurse with [facility] called requesting verbal lorders to change patient's catheter while she is under the care of PT.  She will be under PT care until 10/18 and her next cath change is scheduled for 10/17.  They will then refer back to Urology to continue cath changes.  Daughter is also concerned with pts' urine having an odor.  I have also forwarded her message on mychart.  Please advise.

## 2022-05-11 NOTE — Telephone Encounter (Signed)
Please disregard last telephone encounter.  HH is already doing monthly catheter changes.  I reached out to the daughter to confirm this.  They were confused as to why the nurse reached out for verbal orders in the first place.  She thinks her sister had contacted the wrong [facility] facility since the patient has had several different ones in the past.  The daughter also had concerns of the patient's urine having a foul odor.  She states that the patient has been eating a lot of brussels sprouts and broccoli and is wondering if this could be the cause of the odor since that was mentioned as a possibility at her last OV with Sharee Pimple.  Informed her to increase patient's fluids and try to limit those foods and see if the odor clears up.  I also informed her to reach back out with any new symptoms.  Daughter voiced understanding.

## 2022-05-12 ENCOUNTER — Encounter: Payer: Self-pay | Admitting: Urology

## 2022-05-12 NOTE — Telephone Encounter (Signed)
Spoke with [place] with Home Health. Verbal given to change foley catheter monthly at home by home health nurse.  Will fax over order to sign by MD

## 2022-05-12 NOTE — Telephone Encounter (Signed)
Left a voice message.  Calling back to check on status of orders for a nurse to check patients catheter.  Please advise.  Call back:  [phone]  Thanks, Helene Kelp

## 2022-05-12 NOTE — Telephone Encounter (Signed)
I contacted [facility] and they advised the fax number to send the order is [phone].

## 2022-05-16 NOTE — Telephone Encounter (Addendum)
Patient's daughter aware of MD response, verbal orders for [facility] cath changes given on 09/29.  Nothing further needed.

## 2022-05-22 ENCOUNTER — Encounter (HOSPITAL_COMMUNITY): Payer: Self-pay | Admitting: Emergency Medicine

## 2022-05-22 ENCOUNTER — Emergency Department (HOSPITAL_COMMUNITY)
Admission: EM | Admit: 2022-05-22 | Discharge: 2022-05-22 | Disposition: A | Discharge status: Home / Self Care | Payer: Medicare Other | Attending: Emergency Medicine | Admitting: Emergency Medicine

## 2022-05-22 ENCOUNTER — Other Ambulatory Visit: Payer: Self-pay

## 2022-05-22 DIAGNOSIS — Z7982 Long term (current) use of aspirin: Secondary | ICD-10-CM | POA: Insufficient documentation

## 2022-05-22 DIAGNOSIS — I10 Essential (primary) hypertension: Secondary | ICD-10-CM | POA: Insufficient documentation

## 2022-05-22 DIAGNOSIS — Z79899 Other long term (current) drug therapy: Secondary | ICD-10-CM | POA: Insufficient documentation

## 2022-05-22 LAB — CBC
HCT: 38.9 % (ref 36.0–46.0)
Hemoglobin: 12.7 g/dL (ref 12.0–15.0)
MCH: 32.1 pg (ref 26.0–34.0)
MCHC: 32.6 g/dL (ref 30.0–36.0)
MCV: 98.2 fL (ref 80.0–100.0)
Platelets: 221 10*3/uL (ref 150–400)
RBC: 3.96 MIL/uL (ref 3.87–5.11)
RDW: 15.3 % (ref 11.5–15.5)
WBC: 6.9 10*3/uL (ref 4.0–10.5)
nRBC: 0 % (ref 0.0–0.2)

## 2022-05-22 LAB — BASIC METABOLIC PANEL
Anion gap: 7 (ref 5–15)
BUN: 10 mg/dL (ref 8–23)
CO2: 27 mmol/L (ref 22–32)
Calcium: 9.3 mg/dL (ref 8.9–10.3)
Chloride: 105 mmol/L (ref 98–111)
Creatinine, Ser: 0.71 mg/dL (ref 0.44–1.00)
GFR, Estimated: >60 mL/min (ref >60)
Glucose, Bld: 127 mg/dL — ABNORMAL HIGH (ref 70–99)
Potassium: 3.7 mmol/L (ref 3.5–5.1)
Sodium: 139 mmol/L (ref 135–145)

## 2022-05-22 NOTE — ED Provider Notes (Signed)
[facility] EMERGENCY DEPARTMENT Provider Note   CSN: [id number] Arrival date & time: 05/22/22  1017     History  Chief Complaint  Patient presents with   Hypertension    Anna Mccoy is a 88 y.o. female.  Patient with a past medical history listed below including hypertension who presents to the emergency department with daughter for complaints of hypertension. Daughter states that the patients blood pressure has been fluctuating over the past 3 days. She is on amlodipine and carvedilol for same.  Daughter expressed concern to her home health nurse about this who recommended that she come to the ER if her blood pressure got over 160/90.  Patient's daughter states that this morning when she woke up before taking her blood pressure medication her readings were in the 190s systolic.  She called EMS for same and EMS recommended that she take her home blood pressure medication before coming to the hospital which she did. \Patient reports being asymptomatic and denies any headache, chest pain, shortness of breath, nausea, vomiting, abdominal pain.  The history is provided by the patient. No language interpreter was used.  Hypertension       Home Medications Prior to Admission medications   Medication Sig Start Date End Date Taking? Authorizing Provider  Abatacept (ORENCIA [place]) Inject 1 Syringe into the skin every 30 (thirty) days.   Yes [provider]  acetaminophen (TYLENOL) 500 MG tablet Take 500 mg by mouth at bedtime.   Yes [provider]  amLODipine (NORVASC) 2.5 MG tablet Take 2.5 mg by mouth daily.   Yes [provider]  aspirin 81 MG tablet Take 81 mg by mouth daily.    Yes [provider]  atorvastatin (LIPITOR) 20 MG tablet Take 20 mg by mouth every evening.    Yes [provider]  carvedilol (COREG) 6.25 MG tablet Take 6.25 mg by mouth 2 (two) times daily. 11/18/21  Yes [provider]   Cholecalciferol (VITAMIN D3) 50 MCG (2000 UT) CAPS Take 2,000 Units by mouth daily.   Yes [provider]  divalproex (DEPAKOTE) 250 MG DR tablet Take 1 tablet (250 mg total) by mouth every 12 (twelve) hours. 02/05/15  Yes Rhetta Mura, MD  Docusate Sodium (COLACE PO) Take 2 tablets by mouth at bedtime.   Yes [provider]  folic acid (FOLVITE) 1 MG tablet Take 1 mg by mouth daily.   Yes [provider]  furosemide (LASIX) 20 MG tablet Take 20 mg by mouth every Monday, Wednesday, and Friday. 10/20/16  Yes [provider]  hypromellose (GENTEAL) 0.3 % GEL ophthalmic ointment Place 1 application  into both eyes See admin instructions. Gel - Every day except the days using Tobradex   Yes [provider]  Multiple Vitamins-Minerals (PRESERVISION AREDS PO) Take 1 capsule by mouth 2 (two) times daily.   Yes [provider]  olmesartan (BENICAR) 40 MG tablet Take 40 mg by mouth daily. 11/18/21  Yes [provider]  POLY-IRON 150 150 MG capsule Take 150 mg by mouth daily.  10/13/15  Yes [provider]  primidone (MYSOLINE) 50 MG tablet Take 50 mg by mouth daily. 09/20/16  Yes [provider]  TOBRADEX ophthalmic ointment Place 1 Application into both eyes See admin instructions. Once every 3 days 11/22/21  Yes [provider]  trolamine salicylate (ASPERCREME) 10 % cream Apply 1 Application topically daily as needed for muscle pain. To knees and right shoulder   Yes  [provider]  UNABLE TO FIND Take 1 capsule by mouth daily. Ultra flora    Yes [provider]  levothyroxine (SYNTHROID) 100 MCG tablet Take 100 mcg by mouth daily. Patient not taking: Reported on 05/22/2022 09/29/21   [provider]  oxybutynin (DITROPAN) 5 MG tablet Take 1 tablet (5 mg total) by mouth every 8 (eight) hours as needed for bladder spasms (and foley leakage). Patient not taking: Reported on 05/22/2022 11/23/21    Summerlin, Regan Rakers, PA-C      Allergies    Ace inhibitors, Bacitracin-polymyxin b, Ciprofloxacin, Lidocaine, Neosporin [neomycin-bacitracin zn-polymyx], and Sulfa antibiotics    Review of Systems   Review of Systems  Physical Exam Updated Vital Signs BP (!) 146/79   Pulse 66   Temp 98.6 F (37 C)   Resp (!) 22   LMP 08/14/1981 (Approximate)   SpO2 100%  Physical Exam Vitals and nursing note reviewed.  Constitutional:      General: She is not in acute distress.    Appearance: Normal appearance. She is normal weight. She is not ill-appearing, toxic-appearing or diaphoretic.  HENT:     Head: Normocephalic and atraumatic.  Eyes:     General: No scleral icterus.    Extraocular Movements: Extraocular movements intact.     Conjunctiva/sclera: Conjunctivae normal.     Pupils: Pupils are equal, round, and reactive to light.  Cardiovascular:     Rate and Rhythm: Normal rate and regular rhythm.     Pulses: Normal pulses.     Heart sounds: Normal heart sounds.  Pulmonary:     Effort: Pulmonary effort is normal. No respiratory distress.     Breath sounds: Normal breath sounds.  Abdominal:     General: Abdomen is flat.     Palpations: Abdomen is soft.     Tenderness: There is no abdominal tenderness.  Musculoskeletal:        General: Normal range of motion.     Cervical back: Normal range of motion.     Right lower leg: No edema.     Left lower leg: No edema.  Skin:    General: Skin is warm and dry.  Neurological:     General: No focal deficit present.     Mental Status: She is alert and oriented to person, place, and time.     GCS: GCS eye subscore is 4. GCS verbal subscore is 5. GCS motor subscore is 6.     Sensory: Sensation is intact.     Motor: Motor function is intact.     Coordination: Coordination is intact.     Gait: Gait is intact.     Comments: Alert and oriented moving extremities equally   Speech is fluent, clear without dysarthria or dysphasia.     Strength and sensation equal and bilaterally  Facial movements equal and symmetric   PERRLA and EOMs intact   Psychiatric:        Mood and Affect: Mood normal.        Behavior: Behavior normal.     ED Results / Procedures / Treatments   Labs (all labs ordered are listed, but only abnormal results are displayed) Labs Reviewed  BASIC METABOLIC PANEL - Abnormal; Notable for the following components:      Result Value   Glucose, Bld 127 (*)    All other components within normal limits  CBC    EKG None  Radiology No results found.  Procedures Procedures    Medications Ordered  in ED Medications - No data to display  ED Course/ Medical Decision Making/ A&P                           Medical Decision Making  Presents today with concerns for asymptomatic hypertension.  She is afebrile, nontoxic-appearing, and in no acute distress with reassuring vital signs.  She is also alert and oriented and neurologically intact at her baseline with no focal deficits. CBC and BMP ordered without acute findings.  No leukocytosis or changes in kidney function.  No significant electrolyte derangements.  Hemoglobin stable.  Patient took her home blood pressure medication prior to transport to the hospital. Patients most recent blood pressure is 146/79.  Patient continues to be asymptomatic throughout her 5-hour stay in the emergency department.  She is therefore stable for discharge.  Strongly recommend close PCP follow-up for continued evaluation and management of her high blood pressure.  Patient and family are understanding and amenable with plan, educated on red flag symptoms that would prompt immediate return.  Patient discharged in stable condition.    This is a shared visit with supervising physician Dr. Nechama Guard who has independently evaluated patient & provided guidance in evaluation/management/disposition, in agreement with care    Final Clinical Impression(s) / ED Diagnoses Final  diagnoses:  Hypertension, unspecified type    Rx / DC Orders ED Discharge Orders     None     An After Visit Summary was printed and given to the patient.     Nestor Lewandowsky 05/22/22 1527    Elgie Congo, MD 05/22/22 2120

## 2022-05-22 NOTE — Discharge Instructions (Signed)
As we discussed, your work-up in the ER today was reassuring for acute findings.  I strongly recommend that you reach out to your primary care doctor today to schedule an appointment for continued evaluation and management of your blood pressure readings.  In the interim, please continue to take your blood pressure medication as prescribed.  Return if development of any new or worsening symptoms.

## 2022-05-22 NOTE — ED Provider Triage Note (Signed)
Emergency Medicine Provider Triage Evaluation Note  Anna Mccoy , a 88 y.o. female  was evaluated in triage.  Pt complains of elevated BP for the past three days. Home Health came by and told her to come in to the ED if her BP was above 160/90. Before taking her BP this AM, her BP was in the 190s/110s and EMS was called. They made her take her BP medication while she was at home. The patient is asymptomatic and reports that she feels fine.  Review of Systems  Positive:  Negative:   Physical Exam  BP (!) 169/87   Pulse 72   Temp 98 F (36.7 C) (Oral)   Resp 16   LMP 08/14/1981 (Approximate)   SpO2 100%  Gen:   Awake, no distress   Resp:  Normal effort  MSK:   Moves extremities without difficulty  Other:  NAD  Medical Decision Making  Medically screening exam initiated at 10:42 AM.  Appropriate orders placed.  Anna Mccoy was informed that the remainder of the evaluation will be completed by another provider, this initial triage assessment does not replace that evaluation, and the importance of remaining in the ED until their evaluation is complete.  Will order basic labs. Patient's BP is 169/87.   Sherrell Puller, PA-C 05/22/22 1044

## 2022-05-22 NOTE — ED Triage Notes (Signed)
Patient BIB [facility] from home for evaluatin of hypertension, patient seen by home health nurse who instructed patient to ED for further evaluation. Patient has no complaints, denies shortness of breaht, denies headache, denies blurred vision. Patient is alert, oriented x4, and is in no apparent distress at this time.  EMS Vitals BP 184/100 HR 76 RR 16 95% on room air

## 2022-05-30 ENCOUNTER — Encounter: Payer: Self-pay | Admitting: Urology

## 2022-05-30 ENCOUNTER — Ambulatory Visit (INDEPENDENT_AMBULATORY_CARE_PROVIDER_SITE_OTHER): Payer: Medicare Other | Admitting: Urology

## 2022-05-30 VITALS — BP 143/77 | HR 71

## 2022-05-30 DIAGNOSIS — Z978 Presence of other specified devices: Secondary | ICD-10-CM | POA: Diagnosis not present

## 2022-05-30 DIAGNOSIS — R339 Retention of urine, unspecified: Secondary | ICD-10-CM

## 2022-05-30 NOTE — Progress Notes (Signed)
History of Present Illness: Anna Mccoy is a 88 y.o. year old female here for follow-up of catheter dependent urinary retention.  She has her catheter changed once a month.  This is done by the home health care nurse, most recently 2 weeks ago.  She has not had any treatment for UTI over the past 6 months, but did have a purplish urine bag within the past few months.  This resolved without antibiotic management.  Past Medical History:  Diagnosis Date   Anemia    Arthritis    Cerebral infarction due to unspecified mechanism    Cholesterol retinal embolus of both eyes 09/28/2015   Chronic diarrhea of unknown origin 12/04/2015   Chronic diastolic CHF (congestive heart failure) (HCC)    CVA (cerebral infarction) 02/02/2015   DDD (degenerative disc disease), lumbar    Edema    Essential tremor 09/29/2015   Gait disturbance 06/06/2017   Hx-TIA (transient ischemic attack)    Hypertension    Hypertensive heart disease with heart failure ([name]) 01/13/2016   Hypertrophic toenail 09/28/2015   Hypothyroidism    HYPOTHYROIDISM   Impacted cerumen of right ear 01/10/2018   Kyphosis (acquired) (postural) 10/07/2016   Menopausal state age 43   Mitral valve regurgitation    TRIVIAL   Osteoporosis 09/28/2015   Peripheral neuropathy 09/28/2015   Post-menopausal bleeding 02/2011   Endo Biopsy 7/12 benign, no hyperplasia   Procidentia of uterus    uses pessary   PVC's (premature ventricular contractions)    RBBB    Rheumatoid arthritis(714.0)    Seizure cerebral ([place])    Stroke ([place]) 1996   pt estimation   Sudden idiopathic hearing loss of right ear 01/18/2018   Syncope 11/10/2017   Vitamin D deficiency disease     Past Surgical History:  Procedure Laterality Date   BACK SURGERY     CHOLECYSTECTOMY, LAPAROSCOPIC  2002   COLONOSCOPY W/ BIOPSIES  6/09   sigmoid diverticuli recheck 5 years   EYE SURGERY  04/24/14   Cataract sx in right eye with lens replacement.   LAMINECTOMY AND  MICRODISCECTOMY LUMBAR SPINE  01/23/09   VAGINAL DELIVERY     x5    Home Medications:  (Not in a hospital admission)   Allergies:  Allergies  Allergen Reactions   Ace Inhibitors Cough   Bacitracin-Polymyxin B     Other reaction(s): Unknown   Ciprofloxacin Diarrhea   Lidocaine Other (See Comments)    unknown   Neosporin [Neomycin-Bacitracin Zn-Polymyx] Other (See Comments)    unknown   Sulfa Antibiotics Other (See Comments)    unknown    Family History  Problem Relation Age of Onset   Hypertension Mother    Stroke Mother    Hypertension Sister    Diabetes Sister    Hypertension Sister    Diabetes Sister     Social History:  reports that she has never smoked. She has never used smokeless tobacco. She reports that she does not drink alcohol and does not use drugs.  ROS: A complete review of systems was performed.  All systems are negative except for pertinent findings as noted.  Physical Exam:  Vital signs in last 24 hours: @VSRANGES @ General:  Alert and oriented, No acute distress.  She is in a wheelchair. HEENT: Normocephalic, atraumatic Neck: No JVD or lymphadenopathy Cardiovascular: Regular rate  Lungs: Normal inspiratory/expiratory excursion Extremities: No edema Neurologic: Grossly intact  I have reviewed prior pt notes  I have reviewed notes from referring/previous  physicians--recent ER visit reviewed.  She presented with hypertension   Impression/Assessment:  Urinary retention, secondary to age and an atonic bladder, with indwelling catheter.  This is changed monthly by home health care.  Plan:  They would like to check back in about 6 months just to check in  Anna Mccoy 05/30/2022, 9:05 AM  Anna Millard. Levy Cedano MD

## 2022-06-21 ENCOUNTER — Emergency Department (HOSPITAL_COMMUNITY)
Admission: EM | Admit: 2022-06-21 | Discharge: 2022-06-22 | Disposition: A | Discharge status: Home / Self Care | Payer: Medicare Other | Attending: Emergency Medicine | Admitting: Emergency Medicine

## 2022-06-21 ENCOUNTER — Encounter (HOSPITAL_COMMUNITY): Payer: Self-pay | Admitting: Emergency Medicine

## 2022-06-21 ENCOUNTER — Telehealth: Payer: Self-pay

## 2022-06-21 DIAGNOSIS — Z79899 Other long term (current) drug therapy: Secondary | ICD-10-CM | POA: Diagnosis not present

## 2022-06-21 DIAGNOSIS — I10 Essential (primary) hypertension: Secondary | ICD-10-CM

## 2022-06-21 DIAGNOSIS — I5032 Chronic diastolic (congestive) heart failure: Secondary | ICD-10-CM | POA: Diagnosis not present

## 2022-06-21 DIAGNOSIS — E039 Hypothyroidism, unspecified: Secondary | ICD-10-CM | POA: Insufficient documentation

## 2022-06-21 DIAGNOSIS — I110 Hypertensive heart disease with heart failure: Secondary | ICD-10-CM | POA: Insufficient documentation

## 2022-06-21 DIAGNOSIS — Z7982 Long term (current) use of aspirin: Secondary | ICD-10-CM | POA: Diagnosis not present

## 2022-06-21 LAB — COMPREHENSIVE METABOLIC PANEL
ALT: 16 U/L (ref 0–44)
AST: 23 U/L (ref 15–41)
Albumin: 3 g/dL — ABNORMAL LOW (ref 3.5–5.0)
Alkaline Phosphatase: 54 U/L (ref 38–126)
Anion gap: 9 (ref 5–15)
BUN: 21 mg/dL (ref 8–23)
CO2: 27 mmol/L (ref 22–32)
Calcium: 9.2 mg/dL (ref 8.9–10.3)
Chloride: 100 mmol/L (ref 98–111)
Creatinine, Ser: 0.75 mg/dL (ref 0.44–1.00)
GFR, Estimated: >60 mL/min (ref >60)
Glucose, Bld: 88 mg/dL (ref 70–99)
Potassium: 3.9 mmol/L (ref 3.5–5.1)
Sodium: 136 mmol/L (ref 135–145)
Total Bilirubin: 0.6 mg/dL (ref 0.3–1.2)
Total Protein: 6.5 g/dL (ref 6.5–8.1)

## 2022-06-21 LAB — CBC WITH DIFFERENTIAL/PLATELET
Abs Immature Granulocytes: 0.01 10*3/uL (ref 0.00–0.07)
Basophils Absolute: 0 10*3/uL (ref 0.0–0.1)
Basophils Relative: 1 %
Eosinophils Absolute: 0.2 10*3/uL (ref 0.0–0.5)
Eosinophils Relative: 4 %
HCT: 38.1 % (ref 36.0–46.0)
Hemoglobin: 12.5 g/dL (ref 12.0–15.0)
Immature Granulocytes: 0 %
Lymphocytes Relative: 41 %
Lymphs Abs: 2.5 10*3/uL (ref 0.7–4.0)
MCH: 32.2 pg (ref 26.0–34.0)
MCHC: 32.8 g/dL (ref 30.0–36.0)
MCV: 98.2 fL (ref 80.0–100.0)
Monocytes Absolute: 0.6 10*3/uL (ref 0.1–1.0)
Monocytes Relative: 10 %
Neutro Abs: 2.8 10*3/uL (ref 1.7–7.7)
Neutrophils Relative %: 44 %
Platelets: 184 10*3/uL (ref 150–400)
RBC: 3.88 MIL/uL (ref 3.87–5.11)
RDW: 14.6 % (ref 11.5–15.5)
WBC: 6.2 10*3/uL (ref 4.0–10.5)
nRBC: 0 % (ref 0.0–0.2)

## 2022-06-21 NOTE — ED Provider Triage Note (Signed)
Emergency Medicine Provider Triage Evaluation Note  Anna Mccoy , a 88 y.o. female  was evaluated in triage.  Pt complains of hypertension, patient with a blood pressure of 166/85 in triage with no symptoms.  Instructed by her home health nurse to be seen in the emergency department.  Review of Systems  Positive: N/A Negative: N/A  Physical Exam  LMP 08/14/1981 (Approximate)  Gen:   Awake, no distress   Resp:  Normal effort  MSK:   Moves extremities without difficulty  Other:    Medical Decision Making  Medically screening exam initiated at 12:30 PM.  Appropriate orders placed.  Anna Mccoy was informed that the remainder of the evaluation will be completed by another provider, this initial triage assessment does not replace that evaluation, and the importance of remaining in the ED until their evaluation is complete.     Claude Manges, PA-C 06/21/22 1232

## 2022-06-21 NOTE — Telephone Encounter (Signed)
Patient's daughter, Saddie Benders  is needing a renewal of home cath care order for patient. It expires 06-21-2022.  Please call  [phone]   Thanks, Rosey Bath

## 2022-06-21 NOTE — ED Triage Notes (Signed)
Patient BIB [facility] from home for evaluation of hypertension, history of same but has no taken her medications today. Patient has no complaints, was told to go to hospital by her home health nurse due to hypertension despite not taking her medications yet. Patient is alert, oriented, and in no apparent distress at this time.

## 2022-06-22 DIAGNOSIS — I110 Hypertensive heart disease with heart failure: Secondary | ICD-10-CM | POA: Diagnosis not present

## 2022-06-22 MED ORDER — AMLODIPINE BESYLATE 5 MG PO TABS: 2.5000 mg | ORAL_TABLET | Freq: Once | ORAL | Status: DC

## 2022-06-22 MED ORDER — CARVEDILOL 3.125 MG PO TABS
6.2500 mg | ORAL_TABLET | Freq: Two times a day (BID) | ORAL | Status: DC
  Administered 2022-06-22: 6.25 mg via ORAL
  Filled 2022-06-22: qty 2

## 2022-06-22 NOTE — ED Provider Notes (Signed)
[facility] EMERGENCY DEPARTMENT Provider Note   CSN: [id number] Arrival date & time: 06/21/22  1210     History  Chief Complaint  Patient presents with   Hypertension    Anna Mccoy is a 88 y.o. female with history of CVA without residual deficits, RA, HTN, hypothyroidism, seizures, chronic diastolic heart failure, chronic indwelling Foley, lumbar spinal stenosis, SIADH, HLD, rectal bleeding, uterovaginal prolapse presents with hypertension.   Patient BIB [facility] from home for evaluation of hypertension, history of same but has no taken her medications today. Patient has no complaints, was told to go to hospital by her home health nurse due to hypertension despite not taking her medications yet. Per daughter at bedside, the initial reading was in the 200s systolic, recheck was 190s.  Patient felt fine at that time but the home health nurse suggested to go to the emergency department.  Paramedics came and had her take her home a.m. blood pressure medications including carvedilol 6.25 mg and olmesartan 40 mg.  Patient also takes amlodipine in the evening.  Patient's blood pressure came down to the 160s in route to the hospital.  Patient never had any symptoms, including headache, shortness of breath, chest pain, palpitations, nausea vomiting, abdominal pain.  Patient states now that she feels normal.  Per chart review, patient was seen for the same on 05/22/2022, daughter states that they followed up with her primary care physician who did not make any changes to her medications.  Hypertension       Home Medications Prior to Admission medications   Medication Sig Start Date End Date Taking? Authorizing Provider  Abatacept (ORENCIA [place]) Inject 1 Syringe into the skin every 30 (thirty) days.    [provider]  acetaminophen (TYLENOL) 500 MG tablet Take 500 mg by mouth at bedtime.    [provider]  amLODipine (NORVASC) 2.5 MG tablet Take 2.5 mg by  mouth daily.    [provider]  aspirin 81 MG tablet Take 81 mg by mouth daily.     [provider]  atorvastatin (LIPITOR) 20 MG tablet Take 20 mg by mouth every evening.     [provider]  carvedilol (COREG) 6.25 MG tablet Take 6.25 mg by mouth 2 (two) times daily. 11/18/21   [provider]  Cholecalciferol (VITAMIN D3) 50 MCG (2000 UT) CAPS Take 2,000 Units by mouth daily.    [provider]  divalproex (DEPAKOTE) 250 MG DR tablet Take 1 tablet (250 mg total) by mouth every 12 (twelve) hours. 02/05/15   Rhetta Mura, MD  Docusate Sodium (COLACE PO) Take 2 tablets by mouth at bedtime.    [provider]  folic acid (FOLVITE) 1 MG tablet Take 1 mg by mouth daily.    [provider]  furosemide (LASIX) 20 MG tablet Take 20 mg by mouth every Monday, Wednesday, and Friday. 10/20/16   [provider]  hypromellose (GENTEAL) 0.3 % GEL ophthalmic ointment Place 1 application  into both eyes See admin instructions. Gel - Every day except the days using Tobradex    [provider]  levothyroxine (SYNTHROID) 100 MCG tablet Take 100 mcg by mouth daily. Patient not taking: Reported on 05/22/2022 09/29/21   [provider]  Multiple Vitamins-Minerals (PRESERVISION AREDS PO) Take 1 capsule by mouth 2 (two) times daily.    [provider]  olmesartan (BENICAR) 40 MG tablet Take 40 mg by mouth daily. 11/18/21   [provider]  oxybutynin (  DITROPAN) 5 MG tablet Take 1 tablet (5 mg total) by mouth every 8 (eight) hours as needed for bladder spasms (and foley leakage). Patient not taking: Reported on 05/22/2022 11/23/21   [place]lin, Regan Rakers, PA-C  POLY-IRON 150 150 MG capsule Take 150 mg by mouth daily.  10/13/15   [provider]  primidone (MYSOLINE) 50 MG tablet Take 50 mg by mouth daily. 09/20/16   [provider]  TOBRADEX ophthalmic ointment Place 1 Application into both eyes  See admin instructions. Once every 3 days 11/22/21   [provider]  trolamine salicylate (ASPERCREME) 10 % cream Apply 1 Application topically daily as needed for muscle pain. To knees and right shoulder    [provider]  UNABLE TO FIND Take 1 capsule by mouth daily. Ultra flora     [provider]      Allergies    Ace inhibitors, Bacitracin-polymyxin b, Ciprofloxacin, Lidocaine, Neosporin [neomycin-bacitracin zn-polymyx], and Sulfa antibiotics    Review of Systems   Review of Systems Review of systems negative for chest pain.  A 10 point review of systems was performed and is negative unless otherwise reported in HPI.  Physical Exam Updated Vital Signs BP (!) 161/85   Pulse 66   Temp 98 F (36.7 C) (Oral)   Resp (!) 30   LMP 08/14/1981 (Approximate)   SpO2 100%  Physical Exam General: Normal appearing elderly female, lying in bed.  HEENT: PERRLA, Sclera anicteric, MMM, trachea midline. Cardiology: RRR, no murmurs/rubs/gallops. BL radial and DP pulses equal bilaterally.  Resp: Normal respiratory rate and effort. CTAB, no wheezes, rhonchi, crackles.  Abd: Soft, non-tender, non-distended. No rebound tenderness or guarding.  GU: Indwelling Foley catheter with yellow urine in the bag, no blood. MSK: No peripheral edema or signs of trauma. Extremities without deformity or TTP. No cyanosis or clubbing. Skin: warm, dry. No rashes or lesions Neuro: A&Ox4, CNs II-XII grossly intact. MAEs. Sensation grossly intact.  Psych: Normal mood and affect.   ED Results / Procedures / Treatments   Labs (all labs ordered are listed, but only abnormal results are displayed) Labs Reviewed  COMPREHENSIVE METABOLIC PANEL - Abnormal; Notable for the following components:      Result Value   Albumin 3.0 (*)    All other components within normal limits  CBC WITH DIFFERENTIAL/PLATELET    EKG EKG Interpretation  Date/Time:  Wednesday June 21 2022 12:41:19  EST Ventricular Rate:  66 PR Interval:  218 QRS Duration: 136 QT Interval:  450 QTC Calculation: 471 R Axis:   -70 Text Interpretation: Sinus rhythm with 1st degree A-V block Left axis deviation Non-specific intra-ventricular conduction block Minimal voltage criteria for LVH, may be normal variant ( Mccoy product )  Wandering baseline limits interpretation When compared with ECG of 22-May-2022 10:39, PREVIOUS ECG IS PRESENT Confirmed by Vivi Barrack [phone]) on 06/22/2022 7:58:01 AM  Radiology No results found.  Procedures Procedures    Medications Ordered in ED Medications  carvedilol (COREG) tablet 6.25 mg (6.25 mg Oral Given 06/22/22 0818)    ED Course/ Medical Decision Making/ A&P                          Medical Decision Making Risk Prescription drug management.   Patient is asymptaomtic and well appearing at this time. Current BP in 150s systolic.   Patient does not have any symptoms on history nor signs on physical exam concerning for end organ damage secondary  to hypertension.  Based on clinical presentation, patient is at sufficiently low risk for ACS given no CP, no SOB, normal cardio-pulmonary exam;  SAH/stroke given no hx of acute headache/stroke-like sxs and normal neurologic exam; and renal given no obvious hematuria in foley, no changes in urine output. CMP/CBC wnl. Patient is given her home coreg this AM and given that olmesartan is not formulary, have instructed patient's daughter to give this to her when they get home. Patient's BP now is 150s-160s, patient is still asymptomatic. The patient was counseled regarding the deleterious effects of hypertension and the necessity of follow up with the patient's primary physician to establish a care plan. Daughter and patient report understanding. They are given DC instructions about HTN and specific return precautions. All questions answered to patient satisfaction.   Dispo: DC in stable condition   I have personally  reviewed and interpreted all labs.   Clinical Course as of 06/22/22 0832  Thu Jun 22, 2022  0800 BP 158/77 [HN]  0815 BP 161/85 [HN]    Clinical Course User Index [HN] Loetta Rough, MD          Final Clinical Impression(s) / ED Diagnoses Final diagnoses:  Asymptomatic hypertension    Rx / DC Orders ED Discharge Orders     None        This note was created using dictation software, which may contain spelling or grammatical errors.    Loetta Rough, MD 06/22/22 [phone]

## 2022-06-22 NOTE — Discharge Instructions (Addendum)
Thank you for coming to [facility] Emergency Department. You were seen for high blood pressure. We did an exam, labs, and imaging, and these showed no acute findings.  Please follow up with your primary care provider within 1 week about your high blood pressure.  If your blood pressure increases again, take your blood pressure medicines as prescribed, sit calmly for a little while, and recheck it. If the top number is persistently >180 mmHg, especially with symptoms, you can call your primary care doctor or come to the emergency department.   We gave you your blood pressure medications Coreg this morning, so you do not need to take that again today. Please take your Olmesartan 40 mg when you get home.   Do not hesitate to return to the ED or call 911 if you experience: -Blood pressure >180 systolic or >110 diastolic associated with headache, visual changes, dizziness, chest pain, shortness of breath -Persistent blood pressure >180/>110 mmHg -Lightheadedness, passing out -Fevers/chills -Anything else that concerns you

## 2022-06-22 NOTE — Telephone Encounter (Signed)
Daughter called and made aware that renewal for [facility] service was faxed over to [facility] with confirmation on 06/13/22

## 2022-07-12 ENCOUNTER — Telehealth: Payer: Self-pay

## 2022-07-12 NOTE — Telephone Encounter (Signed)
Cheryl Flash, RN from [facility] called advising she needed someone to contact her to discuss issues with patients catheter.    Phone: [phone]   Thank you

## 2022-07-13 NOTE — Telephone Encounter (Signed)
Over the past 3-4 weeks HH has received increased after hours calls from daughter that catheter is not draining and is leaking.  [facility] nurse states that she has been checking the catheter and it is draining however, she did notice that the catheter has been kinked at times or not secured.  HH nurse is wondering if you think patient needs to go up in size with her catheter or if the balloon needs to be filled with more than 10cc.  If so patient would need a OV scheduled for this, Erie Noe states that patient's insurance does not cover catheter care.  Please advise.

## 2022-07-17 ENCOUNTER — Encounter: Payer: Self-pay | Admitting: Urology

## 2022-07-18 ENCOUNTER — Encounter: Payer: Self-pay | Admitting: Urology

## 2022-07-18 ENCOUNTER — Ambulatory Visit (INDEPENDENT_AMBULATORY_CARE_PROVIDER_SITE_OTHER): Payer: Medicare Other | Admitting: Urology

## 2022-07-18 VITALS — BP 179/82 | HR 67

## 2022-07-18 DIAGNOSIS — R339 Retention of urine, unspecified: Secondary | ICD-10-CM | POA: Diagnosis not present

## 2022-07-18 DIAGNOSIS — Z978 Presence of other specified devices: Secondary | ICD-10-CM

## 2022-07-18 NOTE — Progress Notes (Signed)
Simple Catheter Placement  Due to urinary retention patient is present today for a foley cath placement.  Patient was cleaned and prepped in a sterile fashion with betadine and 2% lidocaine jelly was instilled into the urethra. A 18 silcone FR foley catheter was inserted, urine return was noted  73ml, urine was  light yellow in color.  The balloon was filled with 10cc of sterile water.  A night bag was attached for drainage. Patient was also given a night bag to take home and was given instruction on how to change from one bag to another.  Patient was given instruction on proper catheter care.  Patient tolerated well, no complications were noted   Performed by: Kennyth Lose, CMA  Additional notes/ Follow up: Follow up as scheduled

## 2022-07-18 NOTE — Progress Notes (Signed)
History of Present Illness: Anna Mccoy is a 88 y.o. year old female with urinary retention.  She has an indwelling 16 French silicone catheter.  Worked in urgently today as her daughter called yesterday that she has some leaking around her catheter.  More of an issue during the day, not at night.  She has been wearing a depends for quite some time.  With the leakage that has been occurring in the past month they have to use a pad inside of that.  No gross hematuria.  Patient not having bladder spasms.  Past Medical History:  Diagnosis Date   Anemia    Arthritis    Cerebral infarction due to unspecified mechanism    Cholesterol retinal embolus of both eyes 09/28/2015   Chronic diarrhea of unknown origin 12/04/2015   Chronic diastolic CHF (congestive heart failure) (HCC)    CVA (cerebral infarction) 02/02/2015   DDD (degenerative disc disease), lumbar    Edema    Essential tremor 09/29/2015   Gait disturbance 06/06/2017   Hx-TIA (transient ischemic attack)    Hypertension    Hypertensive heart disease with heart failure (HCC) 01/13/2016   Hypertrophic toenail 09/28/2015   Hypothyroidism    HYPOTHYROIDISM   Impacted cerumen of right ear 01/10/2018   Kyphosis (acquired) (postural) 10/07/2016   Menopausal state age 86   Mitral valve regurgitation    TRIVIAL   Osteoporosis 09/28/2015   Peripheral neuropathy 09/28/2015   Post-menopausal bleeding 02/2011   Endo Biopsy 7/12 benign, no hyperplasia   Procidentia of uterus    uses pessary   PVC's (premature ventricular contractions)    RBBB    Rheumatoid arthritis(714.0)    Seizure cerebral (HCC)    Stroke (HCC) 1996   pt estimation   Sudden idiopathic hearing loss of right ear 01/18/2018   Syncope 11/10/2017   Vitamin D deficiency disease     Past Surgical History:  Procedure Laterality Date   BACK SURGERY     CHOLECYSTECTOMY, LAPAROSCOPIC  2002   COLONOSCOPY W/ BIOPSIES  6/09   sigmoid diverticuli recheck 5 years   EYE SURGERY   04/24/14   Cataract sx in right eye with lens replacement.   LAMINECTOMY AND MICRODISCECTOMY LUMBAR SPINE  01/23/09   VAGINAL DELIVERY     x5    Home Medications:  (Not in a hospital admission)   Allergies:  Allergies  Allergen Reactions   Ace Inhibitors Cough   Bacitracin-Polymyxin B     Other reaction(s): Unknown   Ciprofloxacin Diarrhea   Lidocaine Other (See Comments)    unknown   Neosporin [Neomycin-Bacitracin Zn-Polymyx] Other (See Comments)    unknown   Sulfa Antibiotics Other (See Comments)    unknown    Family History  Problem Relation Age of Onset   Hypertension Mother    Stroke Mother    Hypertension Sister    Diabetes Sister    Hypertension Sister    Diabetes Sister     Social History:  reports that she has never smoked. She has never used smokeless tobacco. She reports that she does not drink alcohol and does not use drugs.  ROS: A complete review of systems was performed.  All systems are negative except for pertinent findings as noted.  Physical Exam:  Vital signs in last 24 hours: @VSRANGES @ General:  Alert and oriented, No acute distress HEENT: Normocephalic, atraumatic Neck: No JVD or lymphadenopathy Cardiovascular: Regular rate  Lungs: Normal inspiratory/expiratory excursion Neurologic: Grossly intact  I have reviewed prior  pt notes  I have reviewed notes from referring/previous physicians    Impression/Assessment:  Urinary retention with indwelling Foley  Incontinence around the catheter-question etiology  Plan:  1.  I think it worthwhile to try to give her a larger catheter and an 42 French silicone Foley was placed today  2.  Keep scheduled appointment unless other issues occur before then  3.  At patient's daughter's request, I ordered a basic metabolic panel as well as an intact PTH  Bertram Millard Emonii Wienke 07/18/2022, 6:08 AM  Bertram Millard. Kesler Wickham MD

## 2022-07-19 LAB — BASIC METABOLIC PANEL
BUN/Creatinine Ratio: 28 (ref 12–28)
BUN: 17 mg/dL (ref 10–36)
CO2: 28 mmol/L (ref 20–29)
Calcium: 9.4 mg/dL (ref 8.7–10.3)
Chloride: 98 mmol/L (ref 96–106)
Creatinine, Ser: 0.6 mg/dL (ref 0.57–1.00)
Glucose: 88 mg/dL (ref 70–99)
Potassium: 4.3 mmol/L (ref 3.5–5.2)
Sodium: 139 mmol/L (ref 134–144)
eGFR: 83 mL/min/{1.73_m2} (ref >59)

## 2022-08-11 ENCOUNTER — Telehealth: Payer: Self-pay

## 2022-08-11 NOTE — Telephone Encounter (Signed)
Anna Mccoy called with Home health to inform Dr. Retta Diones they will be discharging patient on 01/09 so any future cath changes will need to be done in office.  They are unable to continue this service since patient is no longer receiving therapy through [facility].  Called patient to set up next catheter change, I spoke with pt's daughter Anna Mccoy, she will be contacting PCP for another therapy order and will call back to have Dr. Retta Diones resubmit a cath change order for [facility].

## 2022-08-22 ENCOUNTER — Telehealth: Payer: Self-pay

## 2022-08-22 NOTE — Telephone Encounter (Signed)
Daughter call in today and voiced that mom have been discharged from physical therapy and her cath care will no longer be provided. Daughter wanted to know if there is any resources that could help in addressing mom's cath care. Per Dr. Diona Fanti cath care teaching can be provided to the daughters. Daughters declined and would prefer to have a home health nurse do the cath care. Daughter voiced that she will look into seeing if another home health care is willing to provide cath care for mom. Made daughter aware that Dr. Diona Fanti is willing to sign off on new home health cath care if need be but this will be upside downside battle due to mom not in physical therapy. Daughter voiced understanding

## 2022-08-23 NOTE — Telephone Encounter (Signed)
Daughter would like orders be faxed to [facility] fax [phone] Phone [phone].  They requested the orders be faxed there before they can determine if the patient is eligible for care.  Orders faxed

## 2022-08-29 NOTE — Telephone Encounter (Signed)
Daughter called again to request orders for monthly cath changes be faxed to [facility].  Confirmed fax and re-faxed orders as requested.

## 2022-08-30 ENCOUNTER — Telehealth: Payer: Self-pay

## 2022-08-30 NOTE — Telephone Encounter (Signed)
Daughter called and states Alvis Lemmings needs a referral.  I returned her call and informed her the referral will need to come from PCP and once she is established with home health services we will fax orders for monthly cath changes.  Daughter voiced understanding and will contact PCP.

## 2022-08-31 ENCOUNTER — Encounter: Payer: Self-pay | Admitting: Urology

## 2022-09-01 ENCOUNTER — Telehealth: Payer: Self-pay

## 2022-09-01 NOTE — Telephone Encounter (Signed)
Daughter call in today and voiced that her mom's urine has a strong smell and there was a greenish discharge. Made patient's aware that she need to f/u with GYN and if mom is not having any urinary sxs then the MD will no treat patient. Daughter voiced that mom is not having any sxs. Daughter voiced understanding.

## 2022-09-07 ENCOUNTER — Ambulatory Visit (INDEPENDENT_AMBULATORY_CARE_PROVIDER_SITE_OTHER): Payer: Medicare Other | Admitting: Urology

## 2022-09-07 DIAGNOSIS — Z978 Presence of other specified devices: Secondary | ICD-10-CM

## 2022-09-07 DIAGNOSIS — R8290 Unspecified abnormal findings in urine: Secondary | ICD-10-CM

## 2022-09-07 DIAGNOSIS — R339 Retention of urine, unspecified: Secondary | ICD-10-CM

## 2022-09-07 DIAGNOSIS — R82998 Other abnormal findings in urine: Secondary | ICD-10-CM

## 2022-09-07 NOTE — Progress Notes (Addendum)
Patient presents today for a catheter change and urine specimen due to concerns of a UTI.  Daughter states patient's urine has an odor and she is having a lot of discharge.   Cath Change/ Replacement  Patient is present today for a catheter change due to urinary retention.  10ml of water was removed from the balloon, a 16FR foley cath was removed without difficulty.  Patient was cleaned and prepped in a sterile fashion with betadine and 2% lidocaine jelly was instilled into the urethra. A 16 FR foley cath was replaced into the bladder, no complications were noted. Urine return was noted 30ml and urine was yellow in color. The balloon was filled with 10ml of sterile water. A bed bag was attached for drainage.  Patient was given proper instruction on catheter care.  Urine sent for ua and culture.  Performed by: Guss Bunde, CMA  Follow up: Follow up as scheduled.

## 2022-09-08 ENCOUNTER — Telehealth: Payer: Self-pay

## 2022-09-08 ENCOUNTER — Ambulatory Visit: Payer: Medicare Other | Admitting: Obstetrics and Gynecology

## 2022-09-08 LAB — URINALYSIS, ROUTINE W REFLEX MICROSCOPIC
Bilirubin, UA: NEGATIVE
Glucose, UA: NEGATIVE
Nitrite, UA: POSITIVE — AB
Specific Gravity, UA: 1.01 (ref 1.005–1.030)
Urobilinogen, Ur: 0.2 mg/dL (ref 0.2–1.0)
pH, UA: 6 (ref 5.0–7.5)

## 2022-09-08 LAB — MICROSCOPIC EXAMINATION
RBC, Urine: >30 /hpf — AB (ref 0–2)
WBC, UA: >30 /hpf — AB (ref 0–5)

## 2022-09-08 NOTE — Telephone Encounter (Signed)
Per JJ: "If pt doesn't start feeling better by next week then we can see her."  Pt's daughter per DPR notified and voiced understanding. Will cancel appt and close encounter.

## 2022-09-08 NOTE — Telephone Encounter (Signed)
Pt's daughter calling to inquire if pt needs to come in for appt today @ 215pm. States that she had originally noticed a vaginal discharge with pt but yesterday started experiencing other signs/sxs of a UTI and was able to see URO and had UA/Ucx done. UA was +nitrates, so sent for culture. Pt has not been given any abx at this time. However, pt's daughter inquiring if she should keep appt here. I advised her that we wouldn't do any additional urine tests but may still want to perform a vaginitis swab to make sure that there is no vaginal infections present along with UTI. Pt's daughter just mentioning concerns regarding pt's catheter and if doing any additional probing would only make pt's symptoms/infections worse. I advised pt I would inquire your advise first for confirmation. She voiced understanding. Please advise.

## 2022-09-11 LAB — URINE CULTURE

## 2022-09-11 NOTE — Telephone Encounter (Signed)
Please see positive urine culture. Patient came into office for cath change on 09/07/22. Pt.s daughter requested urine sent for culture due to strong urine odor and vaginal discharge.  Daughter informed by staff that urine odor is normal with a chronic foley. Due to daughters persistence urine was sent for culture per Dr. Jeffie Pollock. Please advise on treatment.

## 2022-09-12 ENCOUNTER — Telehealth: Payer: Self-pay

## 2022-09-12 ENCOUNTER — Encounter: Payer: Self-pay | Admitting: Urology

## 2022-09-12 ENCOUNTER — Ambulatory Visit: Payer: Medicare Other

## 2022-09-12 NOTE — Telephone Encounter (Signed)
-----  Message from Hope Cobb, LPN sent at 09/12/2022  1:07 PM EST ----- See below ----- Message ----- From: Dahlstedt, Stephen, MD Sent: 09/12/2022  12:16 PM EST To: Hope Cobb, LPN  Please call daughter-she has E. coli in her urine which is always going to be found in the urine in somebody with a long-term indwelling catheter.  I strongly recommend against treating for this. ----- Message ----- From: Cobb, Hope, LPN Sent: 09/11/2022   1:20 PM EST To: Stephen Dahlstedt, MD  Please see telephone encounter.  No tx started     

## 2022-09-12 NOTE — Telephone Encounter (Signed)
-----  Message from [facility], LPN sent at 02/14/5008  1:07 PM EST ----- See below ----- Message ----- From: Franchot Gallo, MD Sent: 09/12/2022  12:16 PM EST To: Iris Pert, LPN  Please call daughter-she has E. coli in her urine which is always going to be found in the urine in somebody with a long-term indwelling catheter.  I strongly recommend against treating for this. ----- Message ----- From: Iris Pert, LPN Sent: 3/81/8299   1:20 PM EST To: Franchot Gallo, MD  Please see telephone encounter.  No tx started

## 2022-09-12 NOTE — Telephone Encounter (Signed)
Patient's daughter is aware that mom has E.coli in her urine which is always going to be found in the urine in somebody with long term indwelling catheter and no treatment is needed. Daughter voiced understanding

## 2022-10-05 ENCOUNTER — Ambulatory Visit (INDEPENDENT_AMBULATORY_CARE_PROVIDER_SITE_OTHER): Payer: Medicare Other | Admitting: Urology

## 2022-10-05 DIAGNOSIS — R339 Retention of urine, unspecified: Secondary | ICD-10-CM | POA: Diagnosis not present

## 2022-10-05 NOTE — Progress Notes (Addendum)
Cosigned by Dr. Jeffie Pollock  Cath Change/ Replacement  Patient is present today for a catheter change due to urinary retention.  10 ml of water was removed from the balloon, a 16 FR foley cath was removed without difficulty.  Patient was cleaned and prepped in a sterile fashion with betadine  A 16 FR foley cath was replaced into the bladder, no complications were noted. Urine return was noted 0 ml.  Patient was flushed with 10 ml of sterile water and 10 ml of sterile water was returned . The balloon was filled with 75m of sterile water. A night bag was attached for drainage.  A night bag was also given to the patient and patient was given instruction on how to change from one bag to another. Patient was given proper instruction on catheter care.    Performed by: SMarisue Brooklyn CMA  Follow up: Keep next NV

## 2022-11-02 ENCOUNTER — Ambulatory Visit (INDEPENDENT_AMBULATORY_CARE_PROVIDER_SITE_OTHER): Payer: Medicare Other | Admitting: Urology

## 2022-11-02 DIAGNOSIS — R339 Retention of urine, unspecified: Secondary | ICD-10-CM | POA: Diagnosis not present

## 2022-11-02 NOTE — Progress Notes (Signed)
Cath Change/ Replacement  Patient is present today for a catheter change due to urinary retention.  68ml of water was removed from the balloon, a 16FR foley cath was removed without difficulty.  Patient was cleaned and prepped in a sterile fashion with betadine  A 16 FR foley cath was replaced into the bladder, no complications were noted. Urine return was noted 18ml and urine was yellow in color. The balloon was filled with 6ml of sterile water. A night bag was attached for drainage.  A night bag was also given to the patient and patient was given instruction on how to change from one bag to another. Patient was given proper instruction on catheter care.    Performed by: Marisue Brooklyn, CMA  Follow up: Keep scheduled appt

## 2022-11-28 ENCOUNTER — Encounter: Payer: Self-pay | Admitting: Urology

## 2022-11-28 ENCOUNTER — Ambulatory Visit (INDEPENDENT_AMBULATORY_CARE_PROVIDER_SITE_OTHER): Payer: Medicare Other | Admitting: Urology

## 2022-11-28 VITALS — BP 181/89 | HR 75 | Ht <= 58 in | Wt 113.2 lb

## 2022-11-28 DIAGNOSIS — R339 Retention of urine, unspecified: Secondary | ICD-10-CM | POA: Diagnosis not present

## 2022-11-28 DIAGNOSIS — Z978 Presence of other specified devices: Secondary | ICD-10-CM | POA: Diagnosis not present

## 2022-11-28 DIAGNOSIS — N312 Flaccid neuropathic bladder, not elsewhere classified: Secondary | ICD-10-CM | POA: Diagnosis not present

## 2022-11-28 NOTE — Progress Notes (Signed)
Cath Change/ Replacement  Patient is present today for a catheter change due to urinary retention.  8 ml of water was removed from the balloon, a 16 FR foley cath was removed without difficulty.  Patient was cleaned and prepped in a sterile fashion with betadine and 2% lidocaine jelly was instilled into the urethra. A 16 FR foley cath was replaced into the bladder, no complications were noted. Urine return was noted 30 ml and urine was yellow in color. The balloon was filled with 10ml of sterile water. A over night  bag was attached for drainage.  Patient was given proper instruction on catheter care.    Performed by: Maryland Pink CMA  Follow up: F/UP in 1 mth scheduled nurse visit

## 2022-11-28 NOTE — Addendum Note (Signed)
Addended by: Tilden Dome on: 11/28/2022 03:24 PM   Modules accepted: Orders

## 2022-11-28 NOTE — Progress Notes (Signed)
History of Present Illness: Anna Mccoy is a 89 y.o. year old female here for follow-up of urinary retention secondary to age, debilitation and incomplete bladder emptying.  She gets catheter changes every month.  She has not been treated for urinary tract infections over the past few months.  Has an 81 French Foley, no significant leakage around this.  She comes in today with her daughter, Anna Mccoy Past Medical History:  Diagnosis Date   Anemia    Arthritis    Cerebral infarction due to unspecified mechanism    Cholesterol retinal embolus of both eyes 09/28/2015   Chronic diarrhea of unknown origin 12/04/2015   Chronic diastolic CHF (congestive heart failure) (HCC)    CVA (cerebral infarction) 02/02/2015   DDD (degenerative disc disease), lumbar    Edema    Essential tremor 09/29/2015   Gait disturbance 06/06/2017   Hx-TIA (transient ischemic attack)    Hypertension    Hypertensive heart disease with heart failure (HCC) 01/13/2016   Hypertrophic toenail 09/28/2015   Hypothyroidism    HYPOTHYROIDISM   Impacted cerumen of right ear 01/10/2018   Kyphosis (acquired) (postural) 10/07/2016   Menopausal state age 74   Mitral valve regurgitation    TRIVIAL   Osteoporosis 09/28/2015   Peripheral neuropathy 09/28/2015   Post-menopausal bleeding 02/2011   Endo Biopsy 7/12 benign, no hyperplasia   Procidentia of uterus    uses pessary   PVC's (premature ventricular contractions)    RBBB    Rheumatoid arthritis(714.0)    Seizure cerebral (HCC)    Stroke (HCC) 1996   pt estimation   Sudden idiopathic hearing loss of right ear 01/18/2018   Syncope 11/10/2017   Vitamin D deficiency disease     Past Surgical History:  Procedure Laterality Date   BACK SURGERY     CHOLECYSTECTOMY, LAPAROSCOPIC  2002   COLONOSCOPY W/ BIOPSIES  6/09   sigmoid diverticuli recheck 5 years   EYE SURGERY  04/24/14   Cataract sx in right eye with lens replacement.   LAMINECTOMY AND MICRODISCECTOMY LUMBAR  SPINE  01/23/09   VAGINAL DELIVERY     x5    Home Medications:  (Not in a hospital admission)   Allergies:  Allergies  Allergen Reactions   Ace Inhibitors Cough   Bacitracin-Polymyxin B     Other reaction(s): Unknown   Ciprofloxacin Diarrhea   Lidocaine Other (See Comments)    unknown   Neosporin [Neomycin-Bacitracin Zn-Polymyx] Other (See Comments)    unknown   Sulfa Antibiotics Other (See Comments)    unknown    Family History  Problem Relation Age of Onset   Hypertension Mother    Stroke Mother    Hypertension Sister    Diabetes Sister    Hypertension Sister    Diabetes Sister     Social History:  reports that she has never smoked. She has never used smokeless tobacco. She reports that she does not drink alcohol and does not use drugs.  ROS: A complete review of systems was performed.  All systems are negative except for pertinent findings as noted.  Physical Exam:  Vital signs in last 24 hours: @ General:  Alert and oriented, No acute distress.  In wheelchair. HEENT: Normocephalic, atraumatic Neck: No JVD or lymphadenopathy Cardiovascular: Regular rate  Lungs: Normal inspiratory/expiratory excursion Neurologic: Grossly intact  I have reviewed prior pt notes  I have reviewed notes from referring/previous physicians  I have reviewed urinalysis results  I have independently reviewed prior imaging  I have reviewed prior urine culture   Impression/Assessment:  Urinary retention/atonic bladder with indwelling catheter  Plan:  Catheter was changed today  I will see back in 6 months  I did recommend taking 500 mg of vitamin C daily to help with urinary acidification.  Bertram Millard Kosta Schnitzler 11/28/2022, 2:33 PM  Bertram Millard. Patrich Heinze MD

## 2022-12-25 ENCOUNTER — Ambulatory Visit: Payer: Medicare Other

## 2023-01-01 ENCOUNTER — Ambulatory Visit (INDEPENDENT_AMBULATORY_CARE_PROVIDER_SITE_OTHER): Payer: Medicare Other | Admitting: Urology

## 2023-01-01 DIAGNOSIS — R339 Retention of urine, unspecified: Secondary | ICD-10-CM

## 2023-01-01 NOTE — Progress Notes (Signed)
Cath Change/ Replacement  Patient is present today for a catheter change due to urinary retention.  8 ml of water was removed from the balloon, a 16 FR foley cath was removed without difficulty.  Patient was cleaned and prepped in a sterile fashion with betadine. A 16 FR foley cath was replaced into the bladder, no complications were noted. Urine return was noted 8 ml and urine was yellow in color. The balloon was filled with 10ml of sterile water. A night  bag was attached for drainage.  A night bag was also given to the patient and patient was given instruction on how to change from one bag to another. Patient was given proper instruction on catheter care.    Performed by:  Maryland Pink RMA  Follow up: F/UP Nurse visit as scheduled

## 2023-01-16 ENCOUNTER — Encounter: Payer: Self-pay | Admitting: Urology

## 2023-01-22 ENCOUNTER — Ambulatory Visit: Payer: Medicare Other

## 2023-01-31 ENCOUNTER — Ambulatory Visit: Payer: Medicare Other

## 2023-02-01 ENCOUNTER — Ambulatory Visit (INDEPENDENT_AMBULATORY_CARE_PROVIDER_SITE_OTHER): Payer: Medicare Other

## 2023-02-01 DIAGNOSIS — R339 Retention of urine, unspecified: Secondary | ICD-10-CM

## 2023-02-01 NOTE — Progress Notes (Signed)
Cath Change/ Replacement  Patient is present today for a catheter change due to urinary retention.  10 ml of water was removed from the balloon, a 16 FR foley cath was removed without difficulty.  Patient was cleaned and prepped in a sterile fashion with betadine. A 16 FR foley cath was replaced into the bladder, no complications were noted. Urine return was noted 15 ml and urine was yellow in color. The balloon was filled with 10ml of sterile water. A night bag was attached for drainage.  A night bag was also given to the patient and patient was given instruction on how to change from one bag to another. Patient was given proper instruction on catheter care.    Performed by: Kennyth Lose, CMA  Follow up: No follow-ups on file.

## 2023-02-26 ENCOUNTER — Ambulatory Visit (INDEPENDENT_AMBULATORY_CARE_PROVIDER_SITE_OTHER): Payer: Medicare Other

## 2023-02-26 DIAGNOSIS — R339 Retention of urine, unspecified: Secondary | ICD-10-CM

## 2023-02-26 DIAGNOSIS — Z978 Presence of other specified devices: Secondary | ICD-10-CM

## 2023-02-26 NOTE — Progress Notes (Signed)
Cath Change/ Replacement  Patient is present today for a catheter change due to urinary retention.  10ml of water was removed from the balloon, a 16 FR foley cath was removed without difficulty.  Patient was cleaned and prepped in a sterile fashion with betadine and 2% lidocaine jelly was instilled into the urethra. A 16 FR foley cath was replaced into the bladder, no complications were noted. Urine return was noted 20ml and urine was clear in color. The balloon was filled with 10ml of sterile water. A bedside bag was attached for drainage.  Patient was given proper instruction on catheter care.    Performed by: Zaiyah Sottile LPN  Follow up: keep scheduled NV

## 2023-03-26 ENCOUNTER — Ambulatory Visit: Payer: Medicare Other

## 2023-03-26 DIAGNOSIS — Z978 Presence of other specified devices: Secondary | ICD-10-CM

## 2023-03-26 DIAGNOSIS — R339 Retention of urine, unspecified: Secondary | ICD-10-CM | POA: Diagnosis not present

## 2023-03-26 NOTE — Patient Instructions (Signed)

## 2023-03-26 NOTE — Progress Notes (Signed)
Cath Change/ Replacement  Patient is present today for a catheter change due to urinary retention.  10ml of water was removed from the balloon, a 16FR foley cath was removed without difficulty.  Patient was cleaned and prepped in a sterile fashion with betadine. A 16 FR foley cath was replaced into the bladder, no complications were noted. Urine return was noted 30ml and urine was yellow in color. The balloon was filled with 10ml of sterile water. A bedside bag was attached for drainage. Patient was given proper instruction on catheter care.    Performed by: Zoiey Christy LPN  Follow up: keep scheduled NV

## 2023-04-02 ENCOUNTER — Ambulatory Visit: Payer: Medicare Other

## 2023-04-26 ENCOUNTER — Ambulatory Visit (INDEPENDENT_AMBULATORY_CARE_PROVIDER_SITE_OTHER): Payer: Medicare Other

## 2023-04-26 DIAGNOSIS — Z978 Presence of other specified devices: Secondary | ICD-10-CM

## 2023-04-26 DIAGNOSIS — R339 Retention of urine, unspecified: Secondary | ICD-10-CM | POA: Diagnosis not present

## 2023-04-26 NOTE — Progress Notes (Signed)
Cath Change/ Replacement  Patient is present today for a catheter change due to urinary retention.  10ml of water was removed from the balloon, a 16FR foley cath was removed without difficulty.  Patient was cleaned and prepped in a sterile fashion with betadine and 2% lidocaine jelly was instilled into the urethra. A 16 FR foley cath was replaced into the bladder, no complications were noted. Urine return was noted 10ml and urine was yellow in color. The balloon was filled with 10ml of sterile water. A night bag was attached for drainage.  A night bag was also given to the patient and patient was given instruction on how to change from one bag to another. Patient was given proper instruction on catheter care.    Performed by: Kennyth Lose, CMA  Follow up: No follow-ups on file.

## 2023-05-24 NOTE — Progress Notes (Signed)
History of Present Illness: Anna Mccoy is a 89 y.o. year old female here for follow-up of urinary retention secondary to age, debilitation and incomplete bladder emptying.  She gets catheter changes every month.   She comes in today with her daughter, Anna Mccoy.  Since her last visit with me 6 months ago she has had no real urinary issues.  Still gets her catheter changed here every 4 weeks.   Past Medical History:  Diagnosis Date   Anemia    Arthritis    Cerebral infarction due to unspecified mechanism    Cholesterol retinal embolus of both eyes 09/28/2015   Chronic diarrhea of unknown origin 12/04/2015   Chronic diastolic CHF (congestive heart failure) (HCC)    CVA (cerebral infarction) 02/02/2015   DDD (degenerative disc disease), lumbar    Edema    Essential tremor 09/29/2015   Gait disturbance 06/06/2017   Hx-TIA (transient ischemic attack)    Hypertension    Hypertensive heart disease with heart failure (HCC) 01/13/2016   Hypertrophic toenail 09/28/2015   Hypothyroidism    HYPOTHYROIDISM   Impacted cerumen of right ear 01/10/2018   Kyphosis (acquired) (postural) 10/07/2016   Menopausal state age 17   Mitral valve regurgitation    TRIVIAL   Osteoporosis 09/28/2015   Peripheral neuropathy 09/28/2015   Post-menopausal bleeding 02/2011   Endo Biopsy 7/12 benign, no hyperplasia   Procidentia of uterus    uses pessary   PVC's (premature ventricular contractions)    RBBB    Rheumatoid arthritis(714.0)    Seizure cerebral (HCC)    Stroke (HCC) 1996   pt estimation   Sudden idiopathic hearing loss of right ear 01/18/2018   Syncope 11/10/2017   Vitamin D deficiency disease     Past Surgical History:  Procedure Laterality Date   BACK SURGERY     CHOLECYSTECTOMY, LAPAROSCOPIC  2002   COLONOSCOPY W/ BIOPSIES  6/09   sigmoid diverticuli recheck 5 years   EYE SURGERY  04/24/14   Cataract sx in right eye with lens replacement.   LAMINECTOMY AND MICRODISCECTOMY LUMBAR SPINE   01/23/09   VAGINAL DELIVERY     x5    Home Medications:  (Not in a hospital admission)   Allergies:  Allergies  Allergen Reactions   Ace Inhibitors Cough   Bacitracin-Polymyxin B     Other reaction(s): Unknown   Ciprofloxacin Diarrhea   Lidocaine Other (See Comments)    unknown   Neosporin [Neomycin-Bacitracin Zn-Polymyx] Other (See Comments)    unknown   Sulfa Antibiotics Other (See Comments)    unknown    Family History  Problem Relation Age of Onset   Hypertension Mother    Stroke Mother    Hypertension Sister    Diabetes Sister    Hypertension Sister    Diabetes Sister     Social History:  reports that she has never smoked. She has never used smokeless tobacco. She reports that she does not drink alcohol and does not use drugs.  ROS: A complete review of systems was performed.  All systems are negative except for pertinent findings as noted.  Physical Exam:  Vital signs in last 24 hours: @VSRANGES @ General:  Alert and oriented, No acute distress.  In wheelchair. HEENT: Normocephalic, atraumatic Neck: No JVD or lymphadenopathy Cardiovascular: Regular rate  Lungs: Normal inspiratory/expiratory excursion Neurologic: Grossly intact  I have reviewed prior pt notes   Impression/Assessment:  Urinary retention/atonic bladder with indwelling catheter-she is experiencing no issues with this  Plan:  I will have her come back in 6 months to see me for routine check, continue to 4-week catheter changes Bertram Millard Veva Grimley 05/24/2023, 2:40 PM  Bertram Millard. Anselm Aumiller MD

## 2023-05-29 ENCOUNTER — Encounter: Payer: Self-pay | Admitting: Urology

## 2023-05-29 ENCOUNTER — Ambulatory Visit: Payer: Medicare Other | Admitting: Urology

## 2023-05-29 VITALS — BP 194/101 | HR 74

## 2023-05-29 DIAGNOSIS — Z978 Presence of other specified devices: Secondary | ICD-10-CM

## 2023-05-29 DIAGNOSIS — N312 Flaccid neuropathic bladder, not elsewhere classified: Secondary | ICD-10-CM

## 2023-05-29 DIAGNOSIS — R339 Retention of urine, unspecified: Secondary | ICD-10-CM

## 2023-05-29 NOTE — Progress Notes (Signed)
Cath Change/ Replacement  Patient is present today for a catheter change due to urinary retention.  10ml of water was removed from the balloon, a 16FR foley cath was removed without difficulty.  Patient was cleaned and prepped in a sterile fashion with betadine. A 16 FR foley cath was replaced into the bladder, no complications were noted. Urine return was noted 50ml and urine was yellow in color. The balloon was filled with 10ml of sterile water. A bedside bag was attached for drainage. Patient was given proper instruction on catheter care.    Performed by: Daksha Koone LPN  Follow up:

## 2023-06-04 ENCOUNTER — Ambulatory Visit (INDEPENDENT_AMBULATORY_CARE_PROVIDER_SITE_OTHER): Payer: Medicare Other | Admitting: Podiatry

## 2023-06-04 DIAGNOSIS — Q846 Other congenital malformations of nails: Secondary | ICD-10-CM

## 2023-06-04 DIAGNOSIS — M79675 Pain in left toe(s): Secondary | ICD-10-CM | POA: Diagnosis not present

## 2023-06-04 DIAGNOSIS — B351 Tinea unguium: Secondary | ICD-10-CM | POA: Diagnosis not present

## 2023-06-04 DIAGNOSIS — M79674 Pain in right toe(s): Secondary | ICD-10-CM | POA: Diagnosis not present

## 2023-06-04 NOTE — Progress Notes (Unsigned)
Subjective:  Patient ID: Anna Mccoy, female    DOB: 09/01/1924,  MRN: [id number]   Anna Mccoy presents to clinic today for:  Chief Complaint  Patient presents with   Nail Problem    Pain- left Great toenail-  pain on the medial and lateral side of the toenail.  Has been putting an ointment on the toenail that has made it fee a littlel better.  Fluocinonide (Lidex) 0.05% ointment- soaking in warm water as well.  Pain in the toe for at least a month.  Here with her daughter.  Uses a wheelchair for assistance.     Patient notes nails are thick, discolored, elongated and painful in shoegear when trying to ambulate.    PCP is Anna Banner, MD.  Past Medical History:  Diagnosis Date   Anemia    Arthritis    Cerebral infarction due to unspecified mechanism    Cholesterol retinal embolus of both eyes 09/28/2015   Chronic diarrhea of unknown origin 12/04/2015   Chronic diastolic CHF (congestive heart failure) (HCC)    CVA (cerebral infarction) 02/02/2015   DDD (degenerative disc disease), lumbar    Edema    Essential tremor 09/29/2015   Gait disturbance 06/06/2017   Hx-TIA (transient ischemic attack)    Hypertension    Hypertensive heart disease with heart failure (HCC) 01/13/2016   Hypertrophic toenail 09/28/2015   Hypothyroidism    HYPOTHYROIDISM   Impacted cerumen of right ear 01/10/2018   Kyphosis (acquired) (postural) 10/07/2016   Menopausal state age 70   Mitral valve regurgitation    TRIVIAL   Osteoporosis 09/28/2015   Peripheral neuropathy 09/28/2015   Post-menopausal bleeding 02/2011   Endo Biopsy 7/12 benign, no hyperplasia   Procidentia of uterus    uses pessary   PVC's (premature ventricular contractions)    RBBB    Rheumatoid arthritis(714.0)    Seizure cerebral (HCC)    Stroke (HCC) 1996   pt estimation   Sudden idiopathic hearing loss of right ear 01/18/2018   Syncope 11/10/2017   Vitamin D deficiency disease     Past Surgical History:  Procedure  Laterality Date   BACK SURGERY     CHOLECYSTECTOMY, LAPAROSCOPIC  2002   COLONOSCOPY W/ BIOPSIES  6/09   sigmoid diverticuli recheck 5 years   EYE SURGERY  04/24/14   Cataract sx in right eye with lens replacement.   LAMINECTOMY AND MICRODISCECTOMY LUMBAR SPINE  01/23/09   VAGINAL DELIVERY     x5    Allergies  Allergen Reactions   Ace Inhibitors Cough   Bacitracin-Polymyxin B     Other reaction(s): Unknown   Ciprofloxacin Diarrhea   Lidocaine Other (See Comments)    unknown   Neosporin [Neomycin-Bacitracin Zn-Polymyx] Other (See Comments)    unknown   Sulfa Antibiotics Other (See Comments)    unknown    Review of Systems: Negative except as noted in the HPI.  Objective:  There were no vitals filed for this visit.  Anna Mccoy is a pleasant 89 y.o. female in NAD. AAO x 3.  Vascular Examination: Capillary refill time is 3-5 seconds to toes bilateral.  Trace palpable pedal pulses b/l LE. Digital hair absent b/l.  Skin temperature gradient WNL b/l. No varicosities b/l. No cyanosis noted b/l.   Dermatological Examination: Pedal skin with normal turgor, texture and tone b/l. No open wounds. No interdigital macerations b/l. Toenails x10 are 3mm thick, discolored, dystrophic with subungual debris. There is pain with compression  of the nail plates.  They are elongated x10.  Patient has a pincer nail deformity on the left hallux toenail.  Neurological Examination: Protective sensation intact bilateral LE.   Assessment/Plan: 1. Pain due to onychomycosis of toenails of both feet   2. Abnormality of shape of nail    The mycotic toenails were sharply debrided x10 with sterile nail nippers and a power debriding burr to decrease bulk/thickness and length.    The pincer nail was cut back along the medial and lateral edges to provide relief.  Will try to avoid a PNA procedure on this 89 year old patient.  She noted immediate improvement at the end of the appointment  today.  Return in about 4 months (around 10/05/2023) for RFC.   Clerance Lav, DPM, FACFAS [facility]     [address] [place], [place] [address]                Office [phone]  Fax [phone]

## 2023-06-27 ENCOUNTER — Ambulatory Visit: Payer: Medicare Other

## 2023-06-27 DIAGNOSIS — Z978 Presence of other specified devices: Secondary | ICD-10-CM

## 2023-06-27 DIAGNOSIS — R339 Retention of urine, unspecified: Secondary | ICD-10-CM | POA: Diagnosis not present

## 2023-06-27 NOTE — Progress Notes (Signed)
Cath Change/ Replacement  Patient is present today for a catheter change due to urinary retention.  10ml of water was removed from the balloon, a 16FR foley cath was removed without difficulty.  Patient was cleaned and prepped in a sterile fashion with betadine and 2% lidocaine jelly was instilled into the urethra. A 16 FR foley cath was replaced into the bladder, no complications were noted. Urine return was noted 50ml and urine was yellow in color. The balloon was filled with 10ml of sterile water. A bed bag was attached for drainage.  Patient was given proper instruction on catheter care.    Performed by: Guss Bunde, CMA  Follow up:as scheduled.

## 2023-07-03 ENCOUNTER — Telehealth: Payer: Self-pay | Admitting: Urology

## 2023-07-03 NOTE — Telephone Encounter (Signed)
Patient's daughter in office to pick up catheter anchor.

## 2023-07-03 NOTE — Telephone Encounter (Signed)
Anna Mccoy called the tape that holds the tube to the leg has came off and will not stay. She wants to know if she can come by and pick up a couple. She has tried and nothing will hold it. [phone]

## 2023-08-01 ENCOUNTER — Ambulatory Visit: Payer: Medicare Other

## 2023-08-01 DIAGNOSIS — Z978 Presence of other specified devices: Secondary | ICD-10-CM

## 2023-08-01 DIAGNOSIS — R339 Retention of urine, unspecified: Secondary | ICD-10-CM

## 2023-08-01 NOTE — Progress Notes (Signed)
Cath Change/ Replacement  Patient is present today for a catheter change due to urinary retention.  10ml of water was removed from the balloon, a 16FR foley cath was removed without difficulty.  Patient was cleaned and prepped in a sterile fashion with betadine and 2% lidocaine jelly was instilled into the urethra. A 16 FR foley cath was replaced into the bladder, no complications were noted. Urine return was noted 65ml and urine was yellow in color. The balloon was filled with 10ml of sterile water. A bed bag was attached for drainage.  A night bag was also given to the patient and patient was given instruction on how to change from one bag to another. Patient was given proper instruction on catheter care.    Performed by: Gwendolyn Grant CMA  Follow up: As scheduled on NV

## 2023-08-01 NOTE — Patient Instructions (Signed)

## 2023-08-30 ENCOUNTER — Other Ambulatory Visit: Payer: Self-pay

## 2023-08-30 DIAGNOSIS — T83511A Infection and inflammatory reaction due to indwelling urethral catheter, initial encounter: Secondary | ICD-10-CM | POA: Insufficient documentation

## 2023-08-30 DIAGNOSIS — Z7982 Long term (current) use of aspirin: Secondary | ICD-10-CM | POA: Insufficient documentation

## 2023-08-30 DIAGNOSIS — R300 Dysuria: Secondary | ICD-10-CM | POA: Diagnosis present

## 2023-08-30 DIAGNOSIS — N390 Urinary tract infection, site not specified: Secondary | ICD-10-CM | POA: Insufficient documentation

## 2023-08-30 LAB — URINALYSIS, ROUTINE W REFLEX MICROSCOPIC
Bilirubin Urine: NEGATIVE
Glucose, UA: NEGATIVE mg/dL
Ketones, ur: NEGATIVE mg/dL
Nitrite: NEGATIVE
Protein, ur: 100 mg/dL — AB
RBC / HPF: >50 RBC/hpf (ref 0–5)
Specific Gravity, Urine: 1.014 (ref 1.005–1.030)
WBC, UA: >50 WBC/hpf (ref 0–5)
pH: 6 (ref 5.0–8.0)

## 2023-08-30 NOTE — ED Triage Notes (Signed)
Pt POV from home with family reporting hematuria and decreased urinary output from foley today. Pt denies pain. 0ml found on bladder scan in triage, 50ml tea colored urine in bag, last emptied 11am.

## 2023-08-31 ENCOUNTER — Telehealth: Payer: Self-pay | Admitting: Urology

## 2023-08-31 ENCOUNTER — Emergency Department (HOSPITAL_BASED_OUTPATIENT_CLINIC_OR_DEPARTMENT_OTHER)
Admission: EM | Admit: 2023-08-31 | Discharge: 2023-08-31 | Disposition: A | Discharge status: Home / Self Care | Payer: Medicare Other | Attending: Emergency Medicine | Admitting: Emergency Medicine

## 2023-08-31 DIAGNOSIS — N390 Urinary tract infection, site not specified: Secondary | ICD-10-CM | POA: Diagnosis not present

## 2023-08-31 LAB — CBC WITH DIFFERENTIAL/PLATELET
Abs Immature Granulocytes: 0.06 10*3/uL (ref 0.00–0.07)
Basophils Absolute: 0 10*3/uL (ref 0.0–0.1)
Basophils Relative: 0 %
Eosinophils Absolute: 0.3 10*3/uL (ref 0.0–0.5)
Eosinophils Relative: 2 %
HCT: 36.5 % (ref 36.0–46.0)
Hemoglobin: 12.4 g/dL (ref 12.0–15.0)
Immature Granulocytes: 0 %
Lymphocytes Relative: 29 %
Lymphs Abs: 4.4 10*3/uL — ABNORMAL HIGH (ref 0.7–4.0)
MCH: 32.4 pg (ref 26.0–34.0)
MCHC: 34 g/dL (ref 30.0–36.0)
MCV: 95.3 fL (ref 80.0–100.0)
Monocytes Absolute: 1.5 10*3/uL — ABNORMAL HIGH (ref 0.1–1.0)
Monocytes Relative: 10 %
Neutro Abs: 8.8 10*3/uL — ABNORMAL HIGH (ref 1.7–7.7)
Neutrophils Relative %: 59 %
Platelets: 181 10*3/uL (ref 150–400)
RBC: 3.83 MIL/uL — ABNORMAL LOW (ref 3.87–5.11)
RDW: 14.6 % (ref 11.5–15.5)
WBC: 15.1 10*3/uL — ABNORMAL HIGH (ref 4.0–10.5)
nRBC: 0 % (ref 0.0–0.2)

## 2023-08-31 LAB — BASIC METABOLIC PANEL
Anion gap: 9 (ref 5–15)
BUN: 19 mg/dL (ref 8–23)
CO2: 27 mmol/L (ref 22–32)
Calcium: 8.9 mg/dL (ref 8.9–10.3)
Chloride: 95 mmol/L — ABNORMAL LOW (ref 98–111)
Creatinine, Ser: 0.94 mg/dL (ref 0.44–1.00)
GFR, Estimated: 56 mL/min — ABNORMAL LOW (ref >60)
Glucose, Bld: 88 mg/dL (ref 70–99)
Potassium: 3.5 mmol/L (ref 3.5–5.1)
Sodium: 131 mmol/L — ABNORMAL LOW (ref 135–145)

## 2023-08-31 MED ORDER — SODIUM CHLORIDE 0.9 % IV BOLUS
1000.0000 mL | Freq: Once | INTRAVENOUS | Status: AC
  Administered 2023-08-31: 1000 mL via INTRAVENOUS

## 2023-08-31 MED ORDER — SODIUM CHLORIDE 0.9 % IV SOLN
1.0000 g | Freq: Once | INTRAVENOUS | Status: AC
  Administered 2023-08-31: 1 g via INTRAVENOUS
  Filled 2023-08-31: qty 10

## 2023-08-31 MED ORDER — CEPHALEXIN 500 MG PO CAPS: 500.0000 mg | ORAL_CAPSULE | Freq: Two times a day (BID) | ORAL | 0 refills | Status: AC

## 2023-08-31 NOTE — ED Provider Notes (Signed)
[place] EMERGENCY DEPARTMENT AT [facility]  Provider Note  CSN: [id number] Arrival date & time: 08/30/23 2202  History Chief Complaint  Patient presents with   Dysuria    Anna Mccoy is a 89 y.o. female brought by daughter who provides her history. She has a chronic foley, due to be changed next week, but daughter noticed cloudy, blood tinged urine and decreased output today. Patient denies any complaints, has not had a fever, but daughter reports her stomach was bothering her earlier in the week and so she has not been drinking as much fluids as usual.    Home Medications Prior to Admission medications   Medication Sig Start Date End Date Taking? Authorizing Provider  cephALEXin (KEFLEX) 500 MG capsule Take 1 capsule (500 mg total) by mouth 2 (two) times daily for 7 days. 08/31/23 09/07/23 Yes Pollyann Savoy, MD  Abatacept [facility]) Inject 1 Syringe into the skin every 30 (thirty) days.    [provider]  acetaminophen (TYLENOL) 500 MG tablet Take 500 mg by mouth at bedtime.    [provider]  amLODipine (NORVASC) 2.5 MG tablet Take 2.5 mg by mouth daily.    [provider]  aspirin 81 MG tablet Take 81 mg by mouth daily.     [provider]  atorvastatin (LIPITOR) 20 MG tablet Take 20 mg by mouth every evening.     [provider]  Carboxymethylcell-Hypromellose 0.25-0.3 % GEL Apply to eye. 12/19/21   [provider]  carvedilol (COREG) 6.25 MG tablet Take 6.25 mg by mouth 2 (two) times daily. 11/18/21   [provider]  Cholecalciferol (VITAMIN D3) 50 MCG (2000 UT) CAPS Take 2,000 Units by mouth daily.    [provider]  Dextran 70-Hypromellose (GENTEAL TEARS) 0.1-0.3 % SOLN Apply to eye. 09/10/18   [provider]  divalproex (DEPAKOTE) 250 MG DR tablet Take 1 tablet (250 mg total) by mouth every 12 (twelve) hours. 02/05/15   Rhetta Mura, MD  Docusate Sodium (COLACE PO)  Take 2 tablets by mouth at bedtime.    [provider]  Ergocalciferol 10 MCG (400 UNIT) TABS Take by mouth. 12/19/21   [provider]  erythromycin ophthalmic ointment SMARTSIG:1 Sparingly In Eye(s) 3 Times Daily 09/20/22   [provider]  Fluocinolone Acetonide Body 0.01 % OIL Apply topically daily as needed. 08/13/22   [provider]  Fluocinolone Acetonide Scalp 0.01 % OIL APPLY TOPICALLY TO THE SCALP DAILY AS NEEDED FOR FLARES 10/20/21   [provider]  folic acid (FOLVITE) 1 MG tablet Take 1 mg by mouth daily.    [provider]  furosemide (LASIX) 20 MG tablet Take 20 mg by mouth every Monday, Wednesday, and Friday. 10/20/16   [provider]  hypromellose (GENTEAL) 0.3 % GEL ophthalmic ointment Place 1 application  into both eyes See admin instructions. Gel - Every day except the days using Tobradex    [provider]  levothyroxine (SYNTHROID) 100 MCG tablet Take 100 mcg by mouth daily. 09/29/21   [provider]  Multiple Vitamins-Minerals (PRESERVISION AREDS PO) Take 1 capsule by mouth 2 (two) times daily.    [provider]  olmesartan (BENICAR) 40 MG tablet Take 40 mg by mouth daily. 11/18/21   [provider]  oxybutynin (DITROPAN) 5 MG tablet Take 1 tablet (5 mg total) by mouth every 8 (eight) hours as needed for bladder spasms (and foley leakage). 11/23/21   [place], Ofilia Neas  Annette, PA-C  POLY-IRON 150 150 MG capsule Take 150 mg by mouth daily.  10/13/15   [provider]  primidone (MYSOLINE) 50 MG tablet Take 50 mg by mouth daily. 09/20/16   [provider]  TOBRADEX ophthalmic ointment Place 1 Application into both eyes See admin instructions. Once every 3 days PRN 11/22/21   [provider]  trolamine salicylate (ASPERCREME) 10 % cream Apply 1 Application topically daily as needed for muscle pain. To knees and right shoulder    [provider]  UNABLE TO  FIND Take 1 capsule by mouth daily. Ultra flora     [provider]     Allergies    Ace inhibitors, Bacitracin-polymyxin b, Ciprofloxacin, Lidocaine, Neosporin [neomycin-bacitracin zn-polymyx], and Sulfa antibiotics   Review of Systems   Review of Systems Please see HPI for pertinent positives and negatives  Physical Exam BP (!) 157/74   Pulse 71   Temp 98.1 F (36.7 C) (Oral)   Resp 14   Ht 4\' 8"  (1.422 m)   Wt 50.8 kg   LMP 08/14/1981 (Approximate)   SpO2 98%   BMI 25.11 kg/m   Physical Exam Vitals and nursing note reviewed.  Constitutional:      Appearance: Normal appearance.  HENT:     Head: Normocephalic and atraumatic.     Nose: Nose normal.     Mouth/Throat:     Mouth: Mucous membranes are moist.  Eyes:     Extraocular Movements: Extraocular movements intact.     Conjunctiva/sclera: Conjunctivae normal.  Cardiovascular:     Rate and Rhythm: Normal rate.  Pulmonary:     Effort: Pulmonary effort is normal.     Breath sounds: Normal breath sounds.  Abdominal:     General: Abdomen is flat.     Palpations: Abdomen is soft.     Tenderness: There is no abdominal tenderness. There is no guarding.  Musculoskeletal:        General: No swelling. Normal range of motion.     Cervical back: Neck supple.  Skin:    General: Skin is warm and dry.  Neurological:     General: No focal deficit present.     Mental Status: She is alert.  Psychiatric:        Mood and Affect: Mood normal.     ED Results / Procedures / Treatments   EKG None  Procedures Procedures  Medications Ordered in the ED Medications  sodium chloride 0.9 % bolus 1,000 mL (0 mLs Intravenous Stopped 08/31/23 0319)  cefTRIAXone (ROCEPHIN) 1 g in sodium chloride 0.9 % 100 mL IVPB (0 g Intravenous Stopped 08/31/23 0319)    Initial Impression and Plan  Patient here with decreased UOP, probably due to decreased PO intake. Labs done in triage show CBC with mild leukocytosis, BMP with mild  hyponatremia, normal kidney function. UA consistent with UTI. Will give a liter of fluids, begin Abx and replace foley.   ED Course   Clinical Course as of 08/31/23 0322  Fri Aug 31, 2023  0320 Patient remains stable, discussed admission vs outpatient management. Daughter is comfortable taking her home. Recommend outpatient follow up with urology, oral Abx, oral hydration. RTED for fever, vomiting, AMS or any other concerns.  [CS]    Clinical Course User Index [CS] Pollyann Savoy, MD     MDM Rules/Calculators/A&P Medical Decision Making Problems Addressed: Urinary tract infection associated with indwelling urethral catheter, initial encounter [facility]): acute illness or injury  Amount and/or  Complexity of Data Reviewed Labs: ordered. Decision-making details documented in ED Course.  Risk Prescription drug management. Decision regarding hospitalization.     Final Clinical Impression(s) / ED Diagnoses Final diagnoses:  Urinary tract infection associated with indwelling urethral catheter, initial encounter [facility])    Rx / DC Orders ED Discharge Orders          Ordered    cephALEXin (KEFLEX) 500 MG capsule  2 times daily        08/31/23 0322             Pollyann Savoy, MD 08/31/23 [phone]

## 2023-08-31 NOTE — Telephone Encounter (Signed)
Return call to patient/daughter. Anna Mccoy patient's daughter states patient had little urine output along with gross hematuria into her catheter. Anna Mccoy took patient to ED. ED doctor did urinalysis to confirm UTI  prescribe Keflex po BID. ED provider gave IV fluids along with IV antibiotic while patient was in ED. Patient catheter was change in the ED and nurse visited rescheduled.  Anna Mccoy is advised to increase adequate fluids and keep patient nurse visited and office visited follow with Dr. Retta Diones. Anna Mccoy is aware a message will be sent to Dr. Retta Diones on advisement. [name] voiced understanding.

## 2023-08-31 NOTE — Telephone Encounter (Signed)
Daughter states very little urine in cath bag and some blood

## 2023-09-02 LAB — URINE CULTURE: Culture: >=100000 — AB

## 2023-09-03 ENCOUNTER — Telehealth (HOSPITAL_BASED_OUTPATIENT_CLINIC_OR_DEPARTMENT_OTHER): Payer: Self-pay

## 2023-09-03 NOTE — Telephone Encounter (Signed)
Post ED Visit - Positive Culture Follow-up: Successful Patient Follow-Up  Culture assessed and recommendations reviewed by:  [x]  Ruben Im, Pharm.D. []  Celedonio Miyamoto, Pharm.D., BCPS AQ-ID []  Garvin Fila, Pharm.D., BCPS []  Georgina Pillion, Pharm.D., BCPS []  [place], [place].D., BCPS, AAHIVP []  Estella Husk, Pharm.D., BCPS, AAHIVP []  Lysle Pearl, PharmD, BCPS []  Phillips Climes, PharmD, BCPS []  Agapito Games, PharmD, BCPS []  Verlan Friends, PharmD  Positive urine culture  []  Patient discharged without antimicrobial prescription and treatment is now indicated [x]  Organism is resistant to prescribed ED discharge antimicrobial []  Patient with positive blood cultures  Changes discussed with ED provider: Elayne Snare, MD New antibiotic prescription Cipro 500 mg po daily x 7 days Called to [facility] in [place]  Contacted patient daughter date 09/03/23, time 11:45 am   Sandria Senter 09/03/2023, 11:49 AM

## 2023-09-05 ENCOUNTER — Ambulatory Visit: Payer: Medicare Other

## 2023-10-01 ENCOUNTER — Other Ambulatory Visit: Payer: Self-pay | Admitting: Podiatry

## 2023-10-01 ENCOUNTER — Telehealth: Payer: Self-pay | Admitting: Podiatry

## 2023-10-01 ENCOUNTER — Other Ambulatory Visit: Payer: Self-pay

## 2023-10-01 NOTE — Telephone Encounter (Signed)
Notified pts daughter and she said thank you for getting back to her and she now knows where to go from here.

## 2023-10-01 NOTE — Telephone Encounter (Signed)
Pts daughter called asking refill for the cream for her toe, as it is starting to hurt again.( Lidex)

## 2023-10-08 ENCOUNTER — Encounter: Payer: Self-pay | Admitting: Podiatry

## 2023-10-08 ENCOUNTER — Ambulatory Visit (INDEPENDENT_AMBULATORY_CARE_PROVIDER_SITE_OTHER): Payer: Medicare Other | Admitting: Podiatry

## 2023-10-08 VITALS — Ht <= 58 in | Wt 112.0 lb

## 2023-10-08 DIAGNOSIS — B351 Tinea unguium: Secondary | ICD-10-CM | POA: Diagnosis not present

## 2023-10-08 DIAGNOSIS — M79675 Pain in left toe(s): Secondary | ICD-10-CM

## 2023-10-08 DIAGNOSIS — M79674 Pain in right toe(s): Secondary | ICD-10-CM | POA: Diagnosis not present

## 2023-10-08 NOTE — Progress Notes (Unsigned)
 Subjective:  Patient ID: Anna Mccoy, female    DOB: 03-May-1925,  MRN: [id number]  Anna Mccoy presents to clinic today for:  Chief Complaint  Patient presents with   RFC    " I am here for a nail trim and the left big toe feels like its squeezing into my skin and is painful"   Patient notes nails are thick, discolored, elongated and painful in shoegear when trying to ambulate.  States her left great toenail feels ingrown.  Denies any drainage.  Denies injury to the toe.    PCP is [name], MD. date last seen 06/05/2023  Past Medical History:  Diagnosis Date   Anemia    Arthritis    Cerebral infarction due to unspecified mechanism    Cholesterol retinal embolus of both eyes 09/28/2015   Chronic diarrhea of unknown origin 12/04/2015   Chronic diastolic CHF (congestive heart failure) (HCC)    CVA (cerebral infarction) 02/02/2015   DDD (degenerative disc disease), lumbar    Edema    Essential tremor 09/29/2015   Gait disturbance 06/06/2017   Hx-TIA (transient ischemic attack)    Hypertension    Hypertensive heart disease with heart failure (HCC) 01/13/2016   Hypertrophic toenail 09/28/2015   Hypothyroidism    HYPOTHYROIDISM   Impacted cerumen of right ear 01/10/2018   Kyphosis (acquired) (postural) 10/07/2016   Menopausal state age 63   Mitral valve regurgitation    TRIVIAL   Osteoporosis 09/28/2015   Peripheral neuropathy 09/28/2015   Post-menopausal bleeding 02/2011   Endo Biopsy 7/12 benign, no hyperplasia   Procidentia of uterus    uses pessary   PVC's (premature ventricular contractions)    RBBB    Rheumatoid arthritis(714.0)    Seizure cerebral (HCC)    Stroke (HCC) 1996   pt estimation   Sudden idiopathic hearing loss of right ear 01/18/2018   Syncope 11/10/2017   Vitamin D deficiency disease     Past Surgical History:  Procedure Laterality Date   BACK SURGERY     CHOLECYSTECTOMY, LAPAROSCOPIC  2002   COLONOSCOPY W/ BIOPSIES  6/09   sigmoid  diverticuli recheck 5 years   EYE SURGERY  04/24/14   Cataract sx in right eye with lens replacement.   LAMINECTOMY AND MICRODISCECTOMY LUMBAR SPINE  01/23/09   VAGINAL DELIVERY     x5    Allergies  Allergen Reactions   Ace Inhibitors Cough   Bacitracin-Polymyxin B     Other reaction(s): Unknown   Ciprofloxacin Diarrhea   Lidocaine Other (See Comments)    unknown   Neosporin [Neomycin-Bacitracin Zn-Polymyx] Other (See Comments)    unknown   Sulfa Antibiotics Other (See Comments)    unknown    Review of Systems: Negative except as noted in the HPI.  Objective:  Anna Mccoy is a pleasant 89 y.o. female in NAD. AAO x 3.  Vascular Examination: Capillary refill time is 3-5 seconds to toes bilateral.  Trace palpable pedal pulses b/l LE. Digital hair absent b/l.  Skin temperature gradient WNL b/l.  Mild generalized edema bilateral lower extremity.  Dermatological Examination: Pedal skin with decreased turgor, texture and tone b/l. No open wounds. No interdigital macerations b/l. Toenails x10 are 3 to 4 mm thick, discolored, dystrophic with subungual debris. There is pain with compression of the nail plates.  They are elongated x10.  The left hallux nail lateral border is incurvated along the nail margin and causing some pain along  the distal edge.  No drainage is seen.  No erythema is appreciated  Assessment/Plan: 1. Pain due to onychomycosis of toenails of both feet    The mycotic toenails were sharply debrided x10 with sterile nail nippers and a power debriding burr to decrease bulk/thickness and length.  The hallux nail corners were cut back with sterile nail nippers uneventfully.  Antibiotic ointment applied.  Will have the patient follow-up in 9 weeks instead of 12 weeks since her nails grow fairly quickly and we would like to prevent future ingrown toenails or need for toenail surgical procedures.  Return in about 9 weeks (around 12/10/2023) for RFC.   Clerance Lav,  DPM, FACFAS [facility]     [address] [place], [place] [address]                Office [phone]  Fax [phone]

## 2023-10-09 ENCOUNTER — Ambulatory Visit: Payer: Medicare Other

## 2023-10-10 ENCOUNTER — Ambulatory Visit: Payer: Medicare Other

## 2023-11-26 NOTE — Progress Notes (Signed)
   History of Present Illness: Anna Mccoy is a 89 y.o. year old female here for follow-up of urinary retention secondary to age, debilitation and incomplete bladder emptying.  She has her Foley catheter changed in our office monthly.  She comes in today with her daughter, [name].     Past Medical History:  Diagnosis Date   Anemia    Arthritis    Cerebral infarction due to unspecified mechanism    Cholesterol retinal embolus of both eyes 09/28/2015   Chronic diarrhea of unknown origin 12/04/2015   Chronic diastolic CHF (congestive heart failure) (HCC)    CVA (cerebral infarction) 02/02/2015   DDD (degenerative disc disease), lumbar    Edema    Essential tremor 09/29/2015   Gait disturbance 06/06/2017   Hx-TIA (transient ischemic attack)    Hypertension    Hypertensive heart disease with heart failure (HCC) 01/13/2016   Hypertrophic toenail 09/28/2015   Hypothyroidism    HYPOTHYROIDISM   Impacted cerumen of right ear 01/10/2018   Kyphosis (acquired) (postural) 10/07/2016   Menopausal state age 44   Mitral valve regurgitation    TRIVIAL   Osteoporosis 09/28/2015   Peripheral neuropathy 09/28/2015   Post-menopausal bleeding 02/2011   Endo Biopsy 7/12 benign, no hyperplasia   Procidentia of uterus    uses pessary   PVC's (premature ventricular contractions)    RBBB    Rheumatoid arthritis(714.0)    Seizure cerebral (HCC)    Stroke (HCC) 1996   pt estimation   Sudden idiopathic hearing loss of right ear 01/18/2018   Syncope 11/10/2017   Vitamin D deficiency disease     Past Surgical History:  Procedure Laterality Date   BACK SURGERY     CHOLECYSTECTOMY, LAPAROSCOPIC  2002   COLONOSCOPY W/ BIOPSIES  6/09   sigmoid diverticuli recheck 5 years   EYE SURGERY  04/24/14   Cataract sx in right eye with lens replacement.   LAMINECTOMY AND MICRODISCECTOMY LUMBAR SPINE  01/23/09   VAGINAL DELIVERY     x5    Home Medications:  (Not in a hospital admission)   Allergies:   Allergies  Allergen Reactions   Ace Inhibitors Cough   Bacitracin-Polymyxin B     Other reaction(s): Unknown   Ciprofloxacin Diarrhea   Lidocaine Other (See Comments)    unknown   Neosporin [Neomycin-Bacitracin Zn-Polymyx] Other (See Comments)    unknown   Sulfa Antibiotics Other (See Comments)    unknown    Family History  Problem Relation Age of Onset   Hypertension Mother    Stroke Mother    Hypertension Sister    Diabetes Sister    Hypertension Sister    Diabetes Sister     Social History:  reports that she has never smoked. She has never used smokeless tobacco. She reports that she does not drink alcohol and does not use drugs.  ROS: A complete review of systems was performed.  All systems are negative except for pertinent findings as noted.  Physical Exam:  Vital signs in last 24 hours: @VSRANGES @ General:  Alert and oriented, No acute distress.  In wheelchair. HEENT: Normocephalic, atraumatic Neck: No JVD or lymphadenopathy Cardiovascular: Regular rate  Lungs: Normal inspiratory/expiratory excursion Neurologic: Grossly intact  I have reviewed prior pt notes   Impression/Assessment:  Urinary retention/atonic bladder with indwelling catheter-she is experiencing no issues with this  Plan:

## 2023-11-27 ENCOUNTER — Ambulatory Visit (INDEPENDENT_AMBULATORY_CARE_PROVIDER_SITE_OTHER): Payer: Medicare Other | Admitting: Urology

## 2023-11-27 ENCOUNTER — Encounter: Payer: Self-pay | Admitting: Urology

## 2023-11-27 VITALS — BP 189/103 | HR 75

## 2023-11-27 DIAGNOSIS — R339 Retention of urine, unspecified: Secondary | ICD-10-CM

## 2023-11-27 DIAGNOSIS — N319 Neuromuscular dysfunction of bladder, unspecified: Secondary | ICD-10-CM

## 2023-11-27 DIAGNOSIS — Z978 Presence of other specified devices: Secondary | ICD-10-CM

## 2023-11-27 NOTE — Telephone Encounter (Signed)
 FYI

## 2023-12-08 ENCOUNTER — Telehealth: Admitting: Physician Assistant

## 2023-12-08 ENCOUNTER — Emergency Department (HOSPITAL_BASED_OUTPATIENT_CLINIC_OR_DEPARTMENT_OTHER): Admission: EM | Admit: 2023-12-08 | Discharge: 2023-12-08 | Disposition: A | Discharge status: Home / Self Care

## 2023-12-08 ENCOUNTER — Other Ambulatory Visit: Payer: Self-pay

## 2023-12-08 DIAGNOSIS — R8290 Unspecified abnormal findings in urine: Secondary | ICD-10-CM | POA: Diagnosis present

## 2023-12-08 DIAGNOSIS — Z960 Presence of urogenital implants: Secondary | ICD-10-CM | POA: Insufficient documentation

## 2023-12-08 DIAGNOSIS — N390 Urinary tract infection, site not specified: Secondary | ICD-10-CM | POA: Insufficient documentation

## 2023-12-08 DIAGNOSIS — Z978 Presence of other specified devices: Secondary | ICD-10-CM

## 2023-12-08 DIAGNOSIS — Z7982 Long term (current) use of aspirin: Secondary | ICD-10-CM | POA: Insufficient documentation

## 2023-12-08 DIAGNOSIS — R3989 Other symptoms and signs involving the genitourinary system: Secondary | ICD-10-CM

## 2023-12-08 LAB — URINALYSIS, ROUTINE W REFLEX MICROSCOPIC
Bilirubin Urine: NEGATIVE
Glucose, UA: NEGATIVE mg/dL
Ketones, ur: NEGATIVE mg/dL
Nitrite: NEGATIVE
Protein, ur: NEGATIVE mg/dL
Specific Gravity, Urine: <1.005 — ABNORMAL LOW (ref 1.005–1.030)
WBC, UA: >50 WBC/hpf (ref 0–5)
pH: 7.5 (ref 5.0–8.0)

## 2023-12-08 MED ORDER — CEPHALEXIN 500 MG PO CAPS: 500.0000 mg | ORAL_CAPSULE | Freq: Four times a day (QID) | ORAL | 0 refills | Status: AC

## 2023-12-08 NOTE — ED Provider Notes (Signed)
 [place] EMERGENCY DEPARTMENT AT [facility] Provider Note   CSN: [id number] Arrival date & time: 12/08/23  2042     History  No chief complaint on file.   Anna Mccoy is a 89 y.o. female.  This is a 89 year old female with chronic indwelling urinary Foley catheter presenting the emergency department with cloudy urine.  Had what look like blood in your catheter bag a few days ago.  Since then has had some waxing and waning dark-colored urine.  No other complaints.  No fevers no chills.  Family at bedside notes that she is at her baseline.        Home Medications Prior to Admission medications   Medication Sig Start Date End Date Taking? Authorizing Provider  Abatacept  (ORENCIA  [place]) Inject 1 Syringe into the skin every 30 (thirty) days.    [provider]  acetaminophen  (TYLENOL ) 500 MG tablet Take 500 mg by mouth at bedtime.    [provider]  amLODipine  (NORVASC ) 2.5 MG tablet Take 2.5 mg by mouth daily.    [provider]  aspirin  81 MG tablet Take 81 mg by mouth daily.     [provider]  atorvastatin  (LIPITOR) 20 MG tablet Take 20 mg by mouth every evening.     [provider]  Carboxymethylcell-Hypromellose 0.25-0.3 % GEL Apply to eye. 12/19/21   [provider]  carvedilol  (COREG ) 6.25 MG tablet Take 6.25 mg by mouth 2 (two) times daily. 11/18/21   [provider]  Cholecalciferol (VITAMIN D3) 50 MCG (2000 UT) CAPS Take 2,000 Units by mouth daily.    [provider]  ciprofloxacin (CIPRO) 500 MG tablet Take by mouth. 09/03/23   [provider]  Dextran 70-Hypromellose (GENTEAL TEARS) 0.1-0.3 % SOLN Apply to eye. 09/10/18   [provider]  divalproex  (DEPAKOTE ) 250 MG DR tablet Take 1 tablet (250 mg total) by mouth every 12 (twelve) hours. 02/05/15   Samtani, Jai-Gurmukh, MD  Docusate Sodium  (COLACE PO) Take 2 tablets by mouth at bedtime.    [provider]   Ergocalciferol  10 MCG (400 UNIT) TABS Take by mouth. 12/19/21   [provider]  erythromycin ophthalmic ointment SMARTSIG:1 Sparingly In Eye(s) 3 Times Daily 09/20/22   [provider]  famotidine  (PEPCID ) 20 MG tablet Take 20 mg by mouth. 09/25/23   [provider]  Fluocinolone Acetonide Body 0.01 % OIL Apply topically daily as needed. 08/13/22   [provider]  Fluocinolone Acetonide Scalp 0.01 % OIL APPLY TOPICALLY TO THE SCALP DAILY AS NEEDED FOR FLARES 10/20/21   [provider]  fluocinonide ointment (LIDEX) 0.05 % Apply 1 Application topically 2 (two) times daily.    [provider]  folic acid  (FOLVITE ) 1 MG tablet Take 1 mg by mouth daily.    [provider]  furosemide  (LASIX ) 20 MG tablet Take 20 mg by mouth every Monday, Wednesday, and Friday. 10/20/16   [provider]  HYDROcodone -acetaminophen  (NORCO/VICODIN) 5-325 MG tablet Take 1 tablet by mouth every 4 (four) hours as needed. Patient not taking: Reported on 11/27/2023 09/20/23   [provider]  hypromellose (GENTEAL) 0.3 % GEL ophthalmic ointment Place 1 application  into both eyes See admin instructions. Gel - Every day except the days using Tobradex    [provider]  levothyroxine  (SYNTHROID ) 100 MCG tablet Take 100 mcg by mouth daily. 09/29/21   [provider]  Multiple Vitamins-Minerals (PRESERVISION AREDS PO) Take 1 capsule by mouth 2 (  two) times daily.    [provider]  olmesartan  (BENICAR ) 40 MG tablet Take 40 mg by mouth daily. 11/18/21   [provider]  oxybutynin  (DITROPAN ) 5 MG tablet Take 1 tablet (5 mg total) by mouth every 8 (eight) hours as needed for bladder spasms (and foley leakage). Patient not taking: Reported on 11/27/2023 11/23/21   Summerlin, Julienne Annette, PA-C  POLY-IRON  150 150 MG capsule Take 150 mg by mouth daily.  10/13/15   [provider]  primidone  (MYSOLINE ) 50 MG tablet Take 50  mg by mouth daily. 09/20/16   [provider]  TOBRADEX ophthalmic ointment Place 1 Application into both eyes See admin instructions. Once every 3 days PRN 11/22/21   [provider]  triamcinolone  ointment (KENALOG) 0.1 % Apply 1 Application topically 2 (two) times daily.    [provider]  trolamine salicylate (ASPERCREME) 10 % cream Apply 1 Application topically daily as needed for muscle pain. To knees and right shoulder    [provider]  UNABLE TO FIND Take 1 capsule by mouth daily. Ultra flora     [provider]      Allergies    Ace inhibitors, Bacitracin-polymyxin b, Ciprofloxacin, Lidocaine, Neosporin [neomycin-bacitracin zn-polymyx], and Sulfa antibiotics    Review of Systems   Review of Systems  Physical Exam Updated Vital Signs BP (!) 186/94   Pulse 69   Temp 98.4 F (36.9 C) (Oral)   Resp 18   Ht 4\' 10"  (1.473 m)   Wt 49.9 kg   LMP 08/14/1981 (Approximate)   SpO2 100%   BMI 22.99 kg/m  Physical Exam Vitals and nursing note reviewed.  Constitutional:      General: She is not in acute distress.    Appearance: She is not toxic-appearing.  HENT:     Head: Normocephalic.     Nose: Nose normal.     Mouth/Throat:     Mouth: Mucous membranes are moist.  Eyes:     Conjunctiva/sclera: Conjunctivae normal.  Cardiovascular:     Rate and Rhythm: Normal rate and regular rhythm.  Pulmonary:     Effort: Pulmonary effort is normal.     Breath sounds: Normal breath sounds.  Abdominal:     General: Abdomen is flat. There is no distension.     Tenderness: There is no abdominal tenderness. There is no guarding or rebound.  Genitourinary:    Comments: Foley catheter in place.  Purple urine bag, some cloudy urine, but no gross hematuria Skin:    General: Skin is warm and dry.     Capillary Refill: Capillary refill takes less than 2 seconds.  Neurological:     Mental Status: She is alert. Mental status is at baseline.   Psychiatric:        Mood and Affect: Mood normal.        Behavior: Behavior normal.     ED Results / Procedures / Treatments   Labs (all labs ordered are listed, but only abnormal results are displayed) Labs Reviewed  URINALYSIS, ROUTINE W REFLEX MICROSCOPIC    EKG None  Radiology No results found.  Procedures Procedures    Medications Ordered in ED Medications - No data to display  ED Course/ Medical Decision Making/ A&P                                 Medical Decision Making So well-appearing 89 year old female presenting  emergency department for evaluation of cloudy urine and her chronic indwelling Foley.  She is afebrile nontachycardic, slightly hypertensive.  At her baseline per family.  Family reports Foley was last exchanged close to a month ago and is due for exchange.  Exchanged here in the emergency department.  Awaiting UA.  Does not appear that she is in obvious distress does not appear to be retaining.  Appropriate for discharge plus or minus antibiotics pending UA.  Amount and/or Complexity of Data Reviewed Labs: ordered.           Final Clinical Impression(s) / ED Diagnoses Final diagnoses:  None    Rx / DC Orders ED Discharge Orders     None         Rolinda Climes, DO 12/08/23 2231

## 2023-12-08 NOTE — ED Triage Notes (Signed)
 Pt POV with daughter reporting hematuria and cloudy urine from catheter x1 week.

## 2023-12-08 NOTE — Discharge Instructions (Signed)
 It appears you have evidence of urinary tract infection.  We are prescribing you antibiotics.  Please follow-up with your primary doctor.  Please take the antibiotics as prescribed for the full course.  Turn if you develop fevers, chills, Foley stops draining urine, you develop abdominal pain, lightheadedness, chest pain, shortness of breath or any new or worsening symptoms that are concerning to you.

## 2023-12-08 NOTE — Progress Notes (Signed)

## 2023-12-10 ENCOUNTER — Ambulatory Visit (INDEPENDENT_AMBULATORY_CARE_PROVIDER_SITE_OTHER): Payer: Medicare Other | Admitting: Podiatry

## 2023-12-10 DIAGNOSIS — M79674 Pain in right toe(s): Secondary | ICD-10-CM

## 2023-12-10 DIAGNOSIS — B351 Tinea unguium: Secondary | ICD-10-CM

## 2023-12-10 DIAGNOSIS — M79675 Pain in left toe(s): Secondary | ICD-10-CM

## 2023-12-10 NOTE — Progress Notes (Unsigned)
 Subjective:  Patient ID: Anna Mccoy, female    DOB: 1924/09/04,  MRN: [id number]   Anna Mccoy presents to clinic today for:  Chief Complaint  Patient presents with   Nail Problem    Nail trim    Patient notes nails are thick, discolored, elongated and painful in shoegear .  She is in a wheelchair today.    PCP is Lazoff, Shawn P, DO.  Past Medical History:  Diagnosis Date   Anemia    Arthritis    Cerebral infarction due to unspecified mechanism    Cholesterol retinal embolus of both eyes 09/28/2015   Chronic diarrhea of unknown origin 12/04/2015   Chronic diastolic CHF (congestive heart failure) (HCC)    CVA (cerebral infarction) 02/02/2015   DDD (degenerative disc disease), lumbar    Edema    Essential tremor 09/29/2015   Gait disturbance 06/06/2017   Hx-TIA (transient ischemic attack)    Hypertension    Hypertensive heart disease with heart failure (HCC) 01/13/2016   Hypertrophic toenail 09/28/2015   Hypothyroidism    HYPOTHYROIDISM   Impacted cerumen of right ear 01/10/2018   Kyphosis (acquired) (postural) 10/07/2016   Menopausal state age 25   Mitral valve regurgitation    TRIVIAL   Osteoporosis 09/28/2015   Peripheral neuropathy 09/28/2015   Post-menopausal bleeding 02/2011   Endo Biopsy 7/12 benign, no hyperplasia   Procidentia of uterus    uses pessary   PVC's (premature ventricular contractions)    RBBB    Rheumatoid arthritis(714.0)    Seizure cerebral (HCC)    Stroke (HCC) 1996   pt estimation   Sudden idiopathic hearing loss of right ear 01/18/2018   Syncope 11/10/2017   Vitamin D  deficiency disease     Past Surgical History:  Procedure Laterality Date   BACK SURGERY     CHOLECYSTECTOMY, LAPAROSCOPIC  2002   COLONOSCOPY W/ BIOPSIES  6/09   sigmoid diverticuli recheck 5 years   EYE SURGERY  04/24/14   Cataract sx in right eye with lens replacement.   LAMINECTOMY AND MICRODISCECTOMY LUMBAR SPINE  01/23/09   VAGINAL DELIVERY     x5     Allergies  Allergen Reactions   Ace Inhibitors Cough   Bacitracin-Polymyxin B     Other reaction(s): Unknown   Ciprofloxacin Diarrhea   Lidocaine Other (See Comments)    unknown   Neosporin [Neomycin-Bacitracin Zn-Polymyx] Other (See Comments)    unknown   Sulfa Antibiotics Other (See Comments)    unknown    Review of Systems: Negative except as noted in the HPI.  Objective:  Anna Mccoy is a pleasant 89 y.o. female in NAD. AAO x 3.  Vascular Examination: Capillary refill time is 3-5 seconds to toes bilateral. Palpable pedal pulses b/l LE. Digital hair present b/l.  Skin temperature gradient WNL b/l. No varicosities b/l. No cyanosis noted b/l.   Dermatological Examination: Pedal skin with normal turgor, texture and tone b/l. No open wounds. No interdigital macerations b/l. Toenails x10 are 3mm thick, discolored, dystrophic with subungual debris. There is pain with compression of the nail plates.  They are elongated x10  Assessment/Plan: 1. Pain due to onychomycosis of toenails of both feet     The mycotic toenails were sharply debrided x10 with sterile nail nippers and a power debriding burr to decrease bulk/thickness and length.    Return in about 3 months (around 03/10/2024) for RFC.   Joe Murders, DPM, FACFAS [facility]     [address] [place], [place] [address]                Office [phone]  Fax [phone]

## 2024-03-05 NOTE — Progress Notes (Signed)
 Date of Service: 03/05/2024 Patient DOB: 11/13/1924   Subjective:     Patient ID: Anna Mccoy is a 89 y.o. female. Patient comes in for Anxiety (Not fasting/), Hypertension, Hypothyroidism, and Hyperlipidemia The patient is a 89yo female with history of:      -hypertension: Currently on amlodipine  5 mg daily carvedilol  6.25 mg twice daily, Lasix  20 mg 3 times a week, olmesartan  40 mg daily;    Daughter checks BP at home and normally below 150/90;     -SIADH:      -hyponatremia (seen in nephrology):       -hypothyroidism: Currently on Synthroid  75 mcg daily         -history of seizures: Currently on Depakote  250 mg twice a day; has been seizure free for over 10 years;      -Dyslipidemia: Currently on Lipitor 20 mg daily         -history of rheumatoid arthritis:  currently on abatacept  injection, currently seeing rheumatology Dr. Leni     -history of urinary retention with indwelling Foley catheter: Currently seeing urology and has catheter changed every months;    -GERD: Currently on famotidine  20 mg daily     -Essential tremors: Currently on primidone  50 mg 1-2 times a day        -Bilateral macular degeneration: Currently seeing Dr. Vivian        -physical deconditioning: pt was having a nurse come to her house once a month to change out her catheter and was also having PT to help her out.      Labs:    03/25 thyroid  lab came back slightly abnormal (low TSH of 0.426) and would recommend to decrease the patient's levothyroxine  from 88 mcg daily to 75 mcg daily with repeat thyroid  lab in 6 weeks for reevaluation.   02/25 CBC, CMP, and cholesterol labs are normal.  Patient's thyroid  was slightly abnormal (TSH low at 0.427) and would recommend to change patient's levothyroxine  from 100 mcg daily to 88 mcg daily with repeat thyroid  lab in 6 weeks.     01/25 BMP had low sodium 131, creatinine 0.94, CBC had elevated white blood cell count of 15.1,     01/24 normal BMP,     Review of Systems Review of Systems  Constitutional:  Negative for chills, fatigue and fever.  Respiratory:  Negative for cough, shortness of breath and wheezing.   Cardiovascular:  Negative for chest pain, palpitations and leg swelling.  Musculoskeletal:  Positive for gait problem.  Psychiatric/Behavioral:  Negative for agitation and behavioral problems.      Medical History[1]  Surgical History[2]    Allergies[3]    Social History   Social History Narrative   Anna Mccoy - daughter moved here from [place] to live with patient and help with household: cooking, transportation, etc.   Other daughter lives close by.    Family History[4]    Objective:  There were no vitals taken for this visit.  Physical Exam Physical Exam Constitutional:      Appearance: Normal appearance.  HENT:     Head: Normocephalic and atraumatic.   Cardiovascular:     Rate and Rhythm: Normal rate and regular rhythm.     Heart sounds: No murmur heard. Pulmonary:     Effort: Pulmonary effort is normal. No respiratory distress.     Breath sounds: No stridor. No wheezing, rhonchi or rales.  Chest:     Chest wall: No tenderness.  Abdominal:  Palpations: Abdomen is soft.     Tenderness: There is no abdominal tenderness.     Comments: Foley catheter present with clear urine.   Musculoskeletal:     Right lower leg: No edema.     Left lower leg: No edema.     Comments: Wheelchair-bound.   Neurological:     Mental Status: She is alert.   Psychiatric:        Mood and Affect: Mood normal.        Behavior: Behavior normal.        Thought Content: Thought content normal.        Judgment: Judgment normal.         Labs: No results found for this or any previous visit (from the past week).  Assessment/Plan:  1. Acquired hypothyroidism (Primary) Obtain thyroid  lab now continue thyroxine as directed. - TSH  2. H/O idiopathic seizure Continue current  medications but call for any seizure-like activity.  3. Dyslipidemia Controlled, continue atorvastatin  as directed.  4. Rheumatoid arthritis, involving unspecified site, unspecified whether rheumatoid factor present    (CMD) Stable, continue medication follow with rheumatology as directed.  5. Urinary retention Currently has a Foley catheter, follow-up with urology as directed.  6. GERD without esophagitis Controlled, continue famotidine  as directed.  7. Essential tremor Controlled, continue primidone  as directed.  8. Age-related physical debility Continue home health care.  9. Hyponatremia Obtain BMP lab now. - Basic Metabolic Panel  10. SIADH (syndrome of inappropriate ADH production) (HCC) Obtain BMP lab now. - Basic Metabolic Panel  Obtain labs (TSH and BMP) now.  Follow-up in 6 months or sooner if needed.  Call for any questions/concerns.  Electronically signed by: Shawn P Lazoff, DO 03/05/2024 1:14 PM  This document was created using the aid of voice recognition Dragon dictation software.        [1] Past Medical History: Diagnosis Date  . Aphthous ulcer   . Aphthous ulcer 09/28/2015  . Arthritis    Rheumatoid   . Benign skin lesion of multiple sites   . Cardiac arrhythmia   . Cerebral infarct    (CMD)   . Cerebral infarction (HCC) 02/02/2015  . Cerebral vascular disease   . Cholesterol retinal embolus of both eyes   . Chronic fatigue   . Chronic mycotic otitis externa   . Chronic mycotic otitis externa 04/03/2018  . Dry skin   . Dysfunction of both eustachian tubes   . Fall at home 08/28/2019  . Frailty   . Gait disturbance   . Hematoma of left lower leg   . History of stroke   . Hypertension   . Hypokalemia   . Kyphosis (acquired) (postural)   . Lower leg edema   . Osteoporosis   . Peripheral neuropathy   . Presbycusis of both ears   . Pure hypercholesterolemia   . Rectal bleeding 03/26/2021  . Seborrheic dermatitis   . Seizure cerebral    (CMD)    . Situational anxiety   . Sudden idiopathic hearing loss of right ear   . Sudden idiopathic hearing loss of right ear 01/18/2018  . Thyroid  disease    hypothyroid  . Uterine prolapse without vaginal wall prolapse   . Vitamin D  deficiency   [2] Past Surgical History: Procedure Laterality Date  . CATARACT EXTRACTION Bilateral    Procedure: CATARACT EXTRACTION  . CHOLECYSTECTOMY     Procedure: CHOLECYSTECTOMY  . COLONOSCOPY  02/05/2008   Procedure: COLONOSCOPY; repeat in 80yrs Dr.Mann  [  3] Allergies Allergen Reactions  . Ace Inhibitors Cough  . Ciprofloxacin Diarrhea  . Lidocaine Hcl Other (See Comments)    unspecified  . Neosporin (Neomycin-Polymyx) Other (See Comments)    unspecified  . Sulfamethoxazole Other (See Comments)    unspecified  . Sulfa (Sulfonamide Antibiotics) GI Intolerance and Rash  [4] Family History Problem Relation Name Age of Onset  . Stroke Mother    . Hypertension Mother    . Osteoporosis Mother    . Cancer Father    . Alcohol  abuse Neg Hx

## 2024-03-13 ENCOUNTER — Observation Stay (HOSPITAL_COMMUNITY)

## 2024-03-13 ENCOUNTER — Encounter (HOSPITAL_COMMUNITY): Payer: Self-pay | Admitting: Emergency Medicine

## 2024-03-13 ENCOUNTER — Emergency Department (HOSPITAL_COMMUNITY)

## 2024-03-13 ENCOUNTER — Inpatient Hospital Stay (HOSPITAL_COMMUNITY)
Admission: EM | Admit: 2024-03-13 | Discharge: 2024-03-16 | DRG: 871 | Disposition: A | Discharge status: Home Health | Attending: Family Medicine | Admitting: Family Medicine

## 2024-03-13 ENCOUNTER — Other Ambulatory Visit: Payer: Self-pay

## 2024-03-13 DIAGNOSIS — R339 Retention of urine, unspecified: Secondary | ICD-10-CM | POA: Diagnosis present

## 2024-03-13 DIAGNOSIS — Z79899 Other long term (current) drug therapy: Secondary | ICD-10-CM

## 2024-03-13 DIAGNOSIS — Z9049 Acquired absence of other specified parts of digestive tract: Secondary | ICD-10-CM

## 2024-03-13 DIAGNOSIS — Z823 Family history of stroke: Secondary | ICD-10-CM

## 2024-03-13 DIAGNOSIS — G9340 Encephalopathy, unspecified: Secondary | ICD-10-CM | POA: Diagnosis present

## 2024-03-13 DIAGNOSIS — R410 Disorientation, unspecified: Secondary | ICD-10-CM

## 2024-03-13 DIAGNOSIS — M810 Age-related osteoporosis without current pathological fracture: Secondary | ICD-10-CM | POA: Diagnosis present

## 2024-03-13 DIAGNOSIS — G40909 Epilepsy, unspecified, not intractable, without status epilepticus: Secondary | ICD-10-CM | POA: Diagnosis present

## 2024-03-13 DIAGNOSIS — Z888 Allergy status to other drugs, medicaments and biological substances status: Secondary | ICD-10-CM

## 2024-03-13 DIAGNOSIS — R531 Weakness: Secondary | ICD-10-CM | POA: Diagnosis not present

## 2024-03-13 DIAGNOSIS — M4000 Postural kyphosis, site unspecified: Secondary | ICD-10-CM | POA: Diagnosis present

## 2024-03-13 DIAGNOSIS — I340 Nonrheumatic mitral (valve) insufficiency: Secondary | ICD-10-CM | POA: Diagnosis present

## 2024-03-13 DIAGNOSIS — R6520 Severe sepsis without septic shock: Secondary | ICD-10-CM | POA: Diagnosis present

## 2024-03-13 DIAGNOSIS — E876 Hypokalemia: Secondary | ICD-10-CM | POA: Diagnosis present

## 2024-03-13 DIAGNOSIS — N390 Urinary tract infection, site not specified: Secondary | ICD-10-CM | POA: Diagnosis present

## 2024-03-13 DIAGNOSIS — U071 COVID-19: Secondary | ICD-10-CM | POA: Diagnosis not present

## 2024-03-13 DIAGNOSIS — E785 Hyperlipidemia, unspecified: Secondary | ICD-10-CM | POA: Diagnosis present

## 2024-03-13 DIAGNOSIS — A4189 Other specified sepsis: Secondary | ICD-10-CM | POA: Diagnosis not present

## 2024-03-13 DIAGNOSIS — Z79890 Hormone replacement therapy (postmenopausal): Secondary | ICD-10-CM

## 2024-03-13 DIAGNOSIS — Z6821 Body mass index (BMI) 21.0-21.9, adult: Secondary | ICD-10-CM

## 2024-03-13 DIAGNOSIS — G9341 Metabolic encephalopathy: Secondary | ICD-10-CM | POA: Diagnosis present

## 2024-03-13 DIAGNOSIS — J1282 Pneumonia due to coronavirus disease 2019: Secondary | ICD-10-CM | POA: Diagnosis present

## 2024-03-13 DIAGNOSIS — H9191 Unspecified hearing loss, right ear: Secondary | ICD-10-CM | POA: Diagnosis present

## 2024-03-13 DIAGNOSIS — N3000 Acute cystitis without hematuria: Principal | ICD-10-CM

## 2024-03-13 DIAGNOSIS — Z7982 Long term (current) use of aspirin: Secondary | ICD-10-CM

## 2024-03-13 DIAGNOSIS — I5032 Chronic diastolic (congestive) heart failure: Secondary | ICD-10-CM | POA: Diagnosis present

## 2024-03-13 DIAGNOSIS — Z882 Allergy status to sulfonamides status: Secondary | ICD-10-CM

## 2024-03-13 DIAGNOSIS — M069 Rheumatoid arthritis, unspecified: Secondary | ICD-10-CM | POA: Diagnosis present

## 2024-03-13 DIAGNOSIS — Z8249 Family history of ischemic heart disease and other diseases of the circulatory system: Secondary | ICD-10-CM

## 2024-03-13 DIAGNOSIS — Z881 Allergy status to other antibiotic agents status: Secondary | ICD-10-CM

## 2024-03-13 DIAGNOSIS — R8271 Bacteriuria: Secondary | ICD-10-CM | POA: Diagnosis present

## 2024-03-13 DIAGNOSIS — I110 Hypertensive heart disease with heart failure: Secondary | ICD-10-CM | POA: Diagnosis present

## 2024-03-13 DIAGNOSIS — E039 Hypothyroidism, unspecified: Secondary | ICD-10-CM | POA: Diagnosis present

## 2024-03-13 DIAGNOSIS — A419 Sepsis, unspecified organism: Secondary | ICD-10-CM | POA: Insufficient documentation

## 2024-03-13 DIAGNOSIS — E860 Dehydration: Secondary | ICD-10-CM | POA: Diagnosis present

## 2024-03-13 DIAGNOSIS — E43 Unspecified severe protein-calorie malnutrition: Secondary | ICD-10-CM | POA: Diagnosis present

## 2024-03-13 DIAGNOSIS — Z8673 Personal history of transient ischemic attack (TIA), and cerebral infarction without residual deficits: Secondary | ICD-10-CM

## 2024-03-13 LAB — COMPREHENSIVE METABOLIC PANEL WITH GFR
ALT: 24 U/L (ref 0–44)
AST: 38 U/L (ref 15–41)
Albumin: 2.8 g/dL — ABNORMAL LOW (ref 3.5–5.0)
Alkaline Phosphatase: 48 U/L (ref 38–126)
Anion gap: 9 (ref 5–15)
BUN: 17 mg/dL (ref 8–23)
CO2: 24 mmol/L (ref 22–32)
Calcium: 8.8 mg/dL — ABNORMAL LOW (ref 8.9–10.3)
Chloride: 100 mmol/L (ref 98–111)
Creatinine, Ser: 0.95 mg/dL (ref 0.44–1.00)
GFR, Estimated: 55 mL/min — ABNORMAL LOW (ref >60)
Glucose, Bld: 100 mg/dL — ABNORMAL HIGH (ref 70–99)
Potassium: 3.8 mmol/L (ref 3.5–5.1)
Sodium: 133 mmol/L — ABNORMAL LOW (ref 135–145)
Total Bilirubin: 0.4 mg/dL (ref 0.0–1.2)
Total Protein: 6.2 g/dL — ABNORMAL LOW (ref 6.5–8.1)

## 2024-03-13 LAB — URINALYSIS, W/ REFLEX TO CULTURE (INFECTION SUSPECTED)
Bilirubin Urine: NEGATIVE
Glucose, UA: NEGATIVE mg/dL
Hgb urine dipstick: NEGATIVE
Ketones, ur: NEGATIVE mg/dL
Nitrite: NEGATIVE
Protein, ur: 30 mg/dL — AB
Specific Gravity, Urine: 1.012 (ref 1.005–1.030)
WBC, UA: >50 WBC/hpf (ref 0–5)
pH: 5 (ref 5.0–8.0)

## 2024-03-13 LAB — CBC WITH DIFFERENTIAL/PLATELET
Abs Immature Granulocytes: 0.09 K/uL — ABNORMAL HIGH (ref 0.00–0.07)
Basophils Absolute: 0 K/uL (ref 0.0–0.1)
Basophils Relative: 0 %
Eosinophils Absolute: 0.2 K/uL (ref 0.0–0.5)
Eosinophils Relative: 2 %
HCT: 34.9 % — ABNORMAL LOW (ref 36.0–46.0)
Hemoglobin: 11.6 g/dL — ABNORMAL LOW (ref 12.0–15.0)
Immature Granulocytes: 1 %
Lymphocytes Relative: 25 %
Lymphs Abs: 2.5 K/uL (ref 0.7–4.0)
MCH: 32.7 pg (ref 26.0–34.0)
MCHC: 33.2 g/dL (ref 30.0–36.0)
MCV: 98.3 fL (ref 80.0–100.0)
Monocytes Absolute: 2.1 K/uL — ABNORMAL HIGH (ref 0.1–1.0)
Monocytes Relative: 21 %
Neutro Abs: 5.1 K/uL (ref 1.7–7.7)
Neutrophils Relative %: 51 %
Platelets: 188 K/uL (ref 150–400)
RBC: 3.55 MIL/uL — ABNORMAL LOW (ref 3.87–5.11)
RDW: 14.9 % (ref 11.5–15.5)
WBC: 10 K/uL (ref 4.0–10.5)
nRBC: 0 % (ref 0.0–0.2)

## 2024-03-13 LAB — I-STAT CG4 LACTIC ACID, ED: Lactic Acid, Venous: 2.7 mmol/L (ref 0.5–1.9)

## 2024-03-13 LAB — MAGNESIUM: Magnesium: 2 mg/dL (ref 1.7–2.4)

## 2024-03-13 LAB — PROTIME-INR
INR: 1.1 (ref 0.8–1.2)
Prothrombin Time: 14.9 s (ref 11.4–15.2)

## 2024-03-13 LAB — PHOSPHORUS: Phosphorus: 2.4 mg/dL — ABNORMAL LOW (ref 2.5–4.6)

## 2024-03-13 LAB — RESP PANEL BY RT-PCR (RSV, FLU A&B, COVID)  RVPGX2
Influenza A by PCR: NEGATIVE
Influenza B by PCR: NEGATIVE
Resp Syncytial Virus by PCR: NEGATIVE
SARS Coronavirus 2 by RT PCR: POSITIVE — AB

## 2024-03-13 LAB — CK: Total CK: 74 U/L (ref 38–234)

## 2024-03-13 MED ORDER — POTASSIUM PHOSPHATES 15 MMOLE/5ML IV SOLN
15.0000 mmol | Freq: Once | INTRAVENOUS | Status: AC
  Administered 2024-03-14: 15 mmol via INTRAVENOUS
  Filled 2024-03-13: qty 5

## 2024-03-13 MED ORDER — LACTATED RINGERS IV BOLUS (SEPSIS)
1000.0000 mL | Freq: Once | INTRAVENOUS | Status: AC
  Administered 2024-03-13: 1000 mL via INTRAVENOUS

## 2024-03-13 MED ORDER — LACTATED RINGERS IV SOLN: INTRAVENOUS | Status: DC

## 2024-03-13 MED ORDER — SODIUM CHLORIDE 0.9 % IV SOLN
2.0000 g | Freq: Once | INTRAVENOUS | Status: AC
  Administered 2024-03-13: 2 g via INTRAVENOUS
  Filled 2024-03-13: qty 12.5

## 2024-03-13 NOTE — Assessment & Plan Note (Signed)
 Supportive management  No evidence of hypoxia or pulmonary infiltrate

## 2024-03-13 NOTE — Assessment & Plan Note (Signed)
-   most likely multifactorial secondary to combination of  infection   mild dehydration secondary to decreased by mouth intake,       - treat underlining infection    Ct head pending  - neurological exam appears to be nonfocal but patient unable to cooperate fully   unremarkable

## 2024-03-13 NOTE — ED Notes (Signed)
 I made MD Franklyn aware of critical Lactic Acid 2.65

## 2024-03-13 NOTE — ED Notes (Signed)
 Pt's original catheter removed

## 2024-03-13 NOTE — Subjective & Objective (Signed)
 Patient came in with fever and worsening confusion noted to have cloudy urine  Has indwelling foley catheter At baseline A&O x4 now only x2 Self and place   Febrile to 101/3 Bp 143/82 also found to be covid positive  No hypoxia no chest infiltrate Hx of pseudomonas in the past started on cefepime 

## 2024-03-13 NOTE — H&P (Signed)
 Anna Mccoy FMW:[id number] DOB: 17-Nov-1924 DOA: 03/13/2024     PCP: Macarthur Elouise SQUIBB, DO   Outpatient Specialists:  CARDS: Dr. Leim Moose, MD    Patient arrived to ER on 03/13/24 at 2008 Referred by Attending Franklyn Sid SAILOR, MD   Patient coming from:    Home     Chief Complaint:   Chief Complaint  Patient presents with   Code Sepsis    HPI: Anna Mccoy is a 89 y.o. female with medical history significant of CVA, diastolic CHF, HTN, hypothyroidism, RBBB, RA, seizure, syncope    Presented with  confusion Patient came in with fever and worsening confusion noted to have cloudy urine  Has indwelling foley catheter At baseline A&O x4 now only x2 Self and place   Febrile to 101/3 Bp 143/82 also found to be covid positive  No hypoxia no chest infiltrate Hx of pseudomonas in the past started on cefepime     Has caregivers and her daughter recently had a viral illness Pt denies any cough  But per family she has been a bit more lethargic and confused today   Denies significant ETOH intake   Does not smoke      Regarding pertinent Chronic problems:       HTN on Norvasc , benicar    chronic CHF diastolic - last echo  Recent Results (from the past 56199 hours)  ECHOCARDIOGRAM COMPLETE   Collection Time: 02/19/22  9:45 AM  Result Value   Weight 1,835.2   Height 59   BP 140/79   S' Lateral 1.50   Area-P 1/2 3.01   Narrative      ECHOCARDIOGRAM REPORT      IMPRESSIONS    1. Left ventricular ejection fraction, by estimation, is 60 to 65%. The left ventricle has normal function. The left ventricle has no regional wall motion abnormalities. Left ventricular diastolic parameters are indeterminate.  2. Right ventricular systolic function is normal. The right ventricular size is normal. There is normal pulmonary artery systolic pressure.  3. The mitral valve is grossly normal. No evidence of mitral valve regurgitation. No evidence of mitral stenosis.   4. The aortic valve is grossly normal. Aortic valve regurgitation is not visualized. No aortic stenosis is present.  5. The inferior vena cava is normal in size with greater than 50% respiratory variability, suggesting right atrial pressure of 3 mmHg.               Hypothyroidism:   Lab Results  Component Value Date   TSH 0.996 02/17/2022   on synthroid       Hx of CVA -  with/out residual deficits      Seizure DO -  currently on depakote       While in ER: Clinical Course as of 03/13/24 2240  Thu Mar 13, 2024  2104 Lactic Acid, Venous(!!): 2.7 [HN]  2148 SARS Coronavirus 2 by RT PCR(!): POSITIVE +Covid [HN]  2157 Urinalysis, w/ Reflex to Culture (Infection Suspected) -Urine, Clean Catch(!) +CAUTI, obtained from new catheter placed here in ED. Already ordered cefepime . [HN]    Clinical Course User Index [HN] Franklyn Sid SAILOR, MD         Lab Orders         Culture, blood (Routine x 2)         Resp panel by RT-PCR (RSV, Flu A&B, Covid) Anterior Nasal Swab         Urine Culture  Comprehensive metabolic panel         CBC with Differential         Protime-INR         Urinalysis, w/ Reflex to Culture (Infection Suspected) -Urine, Clean Catch         Lactic acid, plasma         CK         Magnesium         Phosphorus         Procalcitonin         I-Stat Lactic Acid, ED      CT HEAD ordered     CXR -  NON acute   Following Medications were ordered in ER: Medications  lactated ringers  infusion ( Intravenous New Bag/Given 03/13/24 2157)  lactated ringers  bolus 1,000 mL (1,000 mLs Intravenous New Bag/Given 03/13/24 2157)  ceFEPIme  (MAXIPIME ) 2 g in sodium chloride  0.9 % 100 mL IVPB (2 g Intravenous New Bag/Given 03/13/24 2157)        ED Triage Vitals  Encounter Vitals Group     BP 03/13/24 2021 (!) 145/78     Girls Systolic BP Percentile --      Girls Diastolic BP Percentile --      Boys Systolic BP Percentile --      Boys Diastolic BP Percentile --       Pulse Rate 03/13/24 2021 86     Resp 03/13/24 2021 20     Temp 03/13/24 2026 (!) 101.8 F (38.8 C)     Temp Source 03/13/24 2026 Rectal     SpO2 03/13/24 2021 96 %     Weight 03/13/24 2027 110 lb 0.2 oz (49.9 kg)     Height --      Head Circumference --      Peak Flow --      Pain Score --      Pain Loc --      Pain Education --      Exclude from Growth Chart --   UFJK(75)@     _________________________________________ Significant initial  Findings: Abnormal Labs Reviewed  RESP PANEL BY RT-PCR (RSV, FLU A&B, COVID)  RVPGX2 - Abnormal; Notable for the following components:      Result Value   SARS Coronavirus 2 by RT PCR POSITIVE (*)    All other components within normal limits  COMPREHENSIVE METABOLIC PANEL WITH GFR - Abnormal; Notable for the following components:   Sodium 133 (*)    Glucose, Bld 100 (*)    Calcium  8.8 (*)    Total Protein 6.2 (*)    Albumin 2.8 (*)    GFR, Estimated 55 (*)    All other components within normal limits  CBC WITH DIFFERENTIAL/PLATELET - Abnormal; Notable for the following components:   RBC 3.55 (*)    Hemoglobin 11.6 (*)    HCT 34.9 (*)    Monocytes Absolute 2.1 (*)    Abs Immature Granulocytes 0.09 (*)    All other components within normal limits  URINALYSIS, W/ REFLEX TO CULTURE (INFECTION SUSPECTED) - Abnormal; Notable for the following components:   APPearance CLOUDY (*)    Protein, ur 30 (*)    Leukocytes,Ua LARGE (*)    Bacteria, UA MANY (*)    Non Squamous Epithelial 0-5 (*)    All other components within normal limits  I-STAT CG4 LACTIC ACID, ED - Abnormal; Notable for the following components:   Lactic Acid, Venous 2.7 (*)  All other components within normal limits       Cardiac Panel (last 3 results) Recent Labs    03/13/24 2048  CKTOTAL 74     ECG: Ordered Personally reviewed and interpreted by me showing: HR : 81 Rhythm:Sinus rhythm with first degree blcok Incomplete right bundle branch block Inferior  infarct, old Similar to prior QTC 482    The recent clinical data is shown below. Vitals:   03/13/24 2143 03/13/24 2145 03/13/24 2200 03/13/24 2215  BP: (!) 136/107 (!) 154/79 (!) 155/76 (!) 150/78  Pulse: 80 74 74 77  Resp: 17 14 15 15   Temp:      TempSrc:      SpO2: 96% 97% 98% 99%  Weight:        WBC     Component Value Date/Time   WBC 10.0 03/13/2024 2048   LYMPHSABS 2.5 03/13/2024 2048   LYMPHSABS 2.0 02/18/2018 1429   MONOABS 2.1 (H) 03/13/2024 2048   EOSABS 0.2 03/13/2024 2048   EOSABS 0.1 02/18/2018 1429   BASOSABS 0.0 03/13/2024 2048   BASOSABS 0.0 02/18/2018 1429    Lactic Acid, Venous    Component Value Date/Time   LATICACIDVEN 2.7 (HH) 03/13/2024 2059    Procalcitonin   Ordered      UA  evidence of UTI     Urine analysis:    Component Value Date/Time   COLORURINE YELLOW 03/13/2024 2145   APPEARANCEUR CLOUDY (A) 03/13/2024 2145   APPEARANCEUR Turbid (A) 09/07/2022 1634   LABSPEC 1.012 03/13/2024 2145   PHURINE 5.0 03/13/2024 2145   GLUCOSEU NEGATIVE 03/13/2024 2145   HGBUR NEGATIVE 03/13/2024 2145   BILIRUBINUR NEGATIVE 03/13/2024 2145   BILIRUBINUR Negative 09/07/2022 1634   KETONESUR NEGATIVE 03/13/2024 2145   PROTEINUR 30 (A) 03/13/2024 2145   UROBILINOGEN negative (A) 01/06/2020 1049   UROBILINOGEN 1.0 02/04/2015 0539   NITRITE NEGATIVE 03/13/2024 2145   LEUKOCYTESUR LARGE (A) 03/13/2024 2145    Results for orders placed or performed during the hospital encounter of 03/13/24  Resp panel by RT-PCR (RSV, Flu A&B, Covid) Anterior Nasal Swab     Status: Abnormal   Collection Time: 03/13/24  9:02 PM   Specimen: Anterior Nasal Swab  Result Value Ref Range Status   SARS Coronavirus 2 by RT PCR POSITIVE (A) NEGATIVE Final         Influenza A by PCR NEGATIVE NEGATIVE Final   Influenza B by PCR NEGATIVE NEGATIVE Final         Resp Syncytial Virus by PCR NEGATIVE NEGATIVE Final          ABX started Antibiotics Given (last 72 hours)      Date/Time Action Medication Dose Rate   03/13/24 2157 New Bag/Given   ceFEPIme  (MAXIPIME ) 2 g in sodium chloride  0.9 % 100 mL IVPB 2 g 200 mL/hr      VBG pending   __________________________________________________________ Recent Labs  Lab 03/13/24 2048  NA 133*  K 3.8  CO2 24  GLUCOSE 100*  BUN 17  CREATININE 0.95  CALCIUM  8.8*    Cr   stable,    Lab Results  Component Value Date   CREATININE 0.95 03/13/2024   CREATININE 0.94 08/31/2023   CREATININE 0.60 07/18/2022    Recent Labs  Lab 03/13/24 2048  AST 38  ALT 24  ALKPHOS 48  BILITOT 0.4  PROT 6.2*  ALBUMIN 2.8*   Lab Results  Component Value Date   CALCIUM  8.8 (L) 03/13/2024  Plt: Lab Results  Component Value Date   PLT 188 03/13/2024      Recent Labs  Lab 03/13/24 2048  WBC 10.0  NEUTROABS 5.1  HGB 11.6*  HCT 34.9*  MCV 98.3  PLT 188    HG/HCT   stable,      Component Value Date/Time   HGB 11.6 (L) 03/13/2024 2048   HGB 10.7 (L) 02/18/2018 1429   HCT 34.9 (L) 03/13/2024 2048   HCT 33.4 (L) 02/18/2018 1429   MCV 98.3 03/13/2024 2048   MCV 96 02/18/2018 1429     _______________________________________________ Hospitalist was called for admission for   Acute cystitis without hematuria    Disorientation  COVID-19   Generalized weakness      The following Work up has been ordered so far:  Orders Placed This Encounter  Procedures   Culture, blood (Routine x 2)   Resp panel by RT-PCR (RSV, Flu A&B, Covid) Anterior Nasal Swab   Urine Culture   DG Chest Port 1 View if patient is in a treatment room.   CT Head Wo Contrast   Comprehensive metabolic panel   CBC with Differential   Protime-INR   Urinalysis, w/ Reflex to Culture (Infection Suspected) -Urine, Clean Catch   Lactic acid, plasma   CK   Magnesium   Phosphorus   Procalcitonin   Diet NPO time specified   Notify physician (specify)  Specify: Notify provider for possible Code Sepsis   Document height and weight   ED  Cardiac monitoring   Assess and Document Glasgow Coma Scale   Document vital signs within 1-hour of fluid bolus completion. Notify provider of abnormal vital signs despite fluid resuscitation.   DO NOT delay antibiotics if unable to obtain blood culture.   Refer to Sidebar Report: Sepsis Sidebar ED/IP   Notify provider for difficulties obtaining IV access.   Insert peripheral IV x 2   Initiate Carrier Fluid Protocol   Consult to hospitalist   Pulse oximetry, continuous   I-Stat Lactic Acid, ED   ED EKG   EKG 12-Lead     OTHER Significant initial  Findings:  labs showing:     DM  labs:  HbA1C: No results for input(s): HGBA1C in the last 8760 hours.     CBG (last 3)  No results for input(s): GLUCAP in the last 72 hours.        Cultures:    Component Value Date/Time   SDES  08/31/2023 0152    URINE, CATHETERIZED Performed at [facility], [address], [place], [place] [address]    [facility]  08/31/2023 [phone]    NONE Performed at [facility], [address], [place], [place] [address]    CULT >=100,000 COLONIES/mL CITROBACTER FREUNDII (A) 08/31/2023 0152   REPTSTATUS 09/02/2023 FINAL 08/31/2023 0152     Radiological Exams on Admission: DG Chest [facility] if patient is in a treatment room. Result Date: 03/13/2024 CLINICAL DATA:  Fevers and possible sepsis, initial encounter EXAM: PORTABLE CHEST 1 VIEW COMPARISON:  02/22/2022 FINDINGS: The heart size and mediastinal contours are within normal limits. Both lungs are clear. The visualized skeletal structures are unremarkable. IMPRESSION: No active disease. Electronically Signed   By: Oneil Devonshire M.D.   On: 03/13/2024 21:32   _______________________________________________________________________________________________________ Latest  Blood pressure (!) 150/78, pulse 77, temperature (!) 101.8 F (38.8 C), temperature source Rectal, resp. rate 15, weight 49.9 kg, last  menstrual period 08/14/1981, SpO2 99%.   Vitals  labs  and radiology finding personally reviewed  Review of Systems:    Pertinent positives include:   Fevers, chills, fatigue, Constitutional:  No weight loss, night sweats,  weight loss  HEENT:  No headaches, Difficulty swallowing,Tooth/dental problems,Sore throat,  No sneezing, itching, ear ache, nasal congestion, post nasal drip,  Cardio-vascular:  No chest pain, Orthopnea, PND, anasarca, dizziness, palpitations.no Bilateral lower extremity swelling  GI:  No heartburn, indigestion, abdominal pain, nausea, vomiting, diarrhea, change in bowel habits, loss of appetite, melena, blood in stool, hematemesis Resp:  no shortness of breath at rest. No dyspnea on exertion, No excess mucus, no productive cough, No non-productive cough, No coughing up of blood.No change in color of mucus.No wheezing. Skin:  no rash or lesions. No jaundice GU:  no dysuria, change in color of urine, no urgency or frequency. No straining to urinate.  No flank pain.  Musculoskeletal:  No joint pain or no joint swelling. No decreased range of motion. No back pain.  Psych:  No change in mood or affect. No depression or anxiety. No memory loss.  Neuro: no localizing neurological complaints, no tingling, no weakness, no double vision, no gait abnormality, no slurred speech, no confusion  All systems reviewed and apart from HOPI all are negative _______________________________________________________________________________________________ Past Medical History:   Past Medical History:  Diagnosis Date   Anemia    Arthritis    Cerebral infarction due to unspecified mechanism    Cholesterol retinal embolus of both eyes 09/28/2015   Chronic diarrhea of unknown origin 12/04/2015   Chronic diastolic CHF (congestive heart failure) (HCC)    CVA (cerebral infarction) 02/02/2015   DDD (degenerative disc disease), lumbar    Edema    Essential tremor 09/29/2015   Gait  disturbance 06/06/2017   Hx-TIA (transient ischemic attack)    Hypertension    Hypertensive heart disease with heart failure (HCC) 01/13/2016   Hypertrophic toenail 09/28/2015   Hypothyroidism    HYPOTHYROIDISM   Impacted cerumen of right ear 01/10/2018   Kyphosis (acquired) (postural) 10/07/2016   Menopausal state age 89   Mitral valve regurgitation    TRIVIAL   Osteoporosis 09/28/2015   Peripheral neuropathy 09/28/2015   Post-menopausal bleeding 02/2011   Endo Biopsy 7/12 benign, no hyperplasia   Procidentia of uterus    uses pessary   PVC's (premature ventricular contractions)    RBBB    Rheumatoid arthritis(714.0)    Seizure cerebral (HCC)    Stroke (HCC) 1996   pt estimation   Sudden idiopathic hearing loss of right ear 01/18/2018   Syncope 11/10/2017   Vitamin D  deficiency disease       Past Surgical History:  Procedure Laterality Date   BACK SURGERY     CHOLECYSTECTOMY, LAPAROSCOPIC  2002   COLONOSCOPY W/ BIOPSIES  6/09   sigmoid diverticuli recheck 5 years   EYE SURGERY  04/24/14   Cataract sx in right eye with lens replacement.   LAMINECTOMY AND MICRODISCECTOMY LUMBAR SPINE  01/23/09   VAGINAL DELIVERY     x5    Social History:  Ambulatory  cane, walker  wheelchair    reports that she has never smoked. She has never used smokeless tobacco. She reports that she does not drink alcohol  and does not use drugs.     Family History:   Family History  Problem Relation Age of Onset   Hypertension Mother    Stroke Mother    Hypertension Sister    Diabetes Sister    Hypertension Sister  Diabetes Sister    ______________________________________________________________________________________________ Allergies: Allergies  Allergen Reactions   Ace Inhibitors Cough   Bacitracin-Polymyxin B     Other reaction(s): Unknown   Ciprofloxacin Diarrhea   Lidocaine Other (See Comments)    unknown   Neosporin [Neomycin-Bacitracin Zn-Polymyx] Other (See Comments)     unknown   Sulfa Antibiotics Other (See Comments)    unknown     Prior to Admission medications   Medication Sig Start Date End Date Taking? Authorizing Provider  Abatacept  (ORENCIA  [place]) Inject 1 Syringe into the skin every 30 (thirty) days.    [provider]  acetaminophen  (TYLENOL ) 500 MG tablet Take 500 mg by mouth at bedtime.    [provider]  amLODipine  (NORVASC ) 2.5 MG tablet Take 2.5 mg by mouth daily.    [provider]  aspirin  81 MG tablet Take 81 mg by mouth daily.     [provider]  atorvastatin  (LIPITOR) 20 MG tablet Take 20 mg by mouth every evening.     [provider]  Carboxymethylcell-Hypromellose 0.25-0.3 % GEL Apply to eye. 12/19/21   [provider]  carvedilol  (COREG ) 6.25 MG tablet Take 6.25 mg by mouth 2 (two) times daily. 11/18/21   [provider]  Cholecalciferol (VITAMIN D3) 50 MCG (2000 UT) CAPS Take 2,000 Units by mouth daily.    [provider]  ciprofloxacin (CIPRO) 500 MG tablet Take by mouth. 09/03/23   [provider]  Dextran 70-Hypromellose (GENTEAL TEARS) 0.1-0.3 % SOLN Apply to eye. 09/10/18   [provider]  divalproex  (DEPAKOTE ) 250 MG DR tablet Take 1 tablet (250 mg total) by mouth every 12 (twelve) hours. 02/05/15   Samtani, Jai-Gurmukh, MD  Docusate Sodium  (COLACE PO) Take 2 tablets by mouth at bedtime.    [provider]  Ergocalciferol  10 MCG (400 UNIT) TABS Take by mouth. 12/19/21   [provider]  erythromycin ophthalmic ointment SMARTSIG:1 Sparingly In Eye(s) 3 Times Daily 09/20/22   [provider]  famotidine  (PEPCID ) 20 MG tablet Take 20 mg by mouth. 09/25/23   [provider]  Fluocinolone Acetonide Body 0.01 % OIL Apply topically daily as needed. 08/13/22   [provider]  Fluocinolone Acetonide Scalp 0.01 % OIL APPLY TOPICALLY TO THE SCALP DAILY AS NEEDED FOR FLARES 10/20/21   [provider]   fluocinonide ointment (LIDEX) 0.05 % Apply 1 Application topically 2 (two) times daily.    [provider]  folic acid  (FOLVITE ) 1 MG tablet Take 1 mg by mouth daily.    [provider]  furosemide  (LASIX ) 20 MG tablet Take 20 mg by mouth every Monday, Wednesday, and Friday. 10/20/16   [provider]  HYDROcodone -acetaminophen  (NORCO/VICODIN) 5-325 MG tablet Take 1 tablet by mouth every 4 (four) hours as needed. Patient not taking: Reported on 11/27/2023 09/20/23   [provider]  hypromellose (GENTEAL) 0.3 % GEL ophthalmic ointment Place 1 application  into both eyes See admin instructions. Gel - Every day except the days using Tobradex    [provider]  levothyroxine  (SYNTHROID ) 100 MCG tablet Take 100 mcg by mouth daily. 09/29/21   [provider]  Multiple Vitamins-Minerals (PRESERVISION AREDS PO) Take 1 capsule by mouth 2 (two) times daily.    [provider]  olmesartan  (BENICAR ) 40 MG tablet Take 40 mg by mouth daily. 11/18/21   [provider]  oxybutynin  (DITROPAN ) 5 MG tablet Take 1 tablet (5 mg total) by mouth every 8 (eight) hours  as needed for bladder spasms (and foley leakage). Patient not taking: Reported on 11/27/2023 11/23/21   Summerlin, Julienne Annette, PA-C  POLY-IRON  150 150 MG capsule Take 150 mg by mouth daily.  10/13/15   [provider]  primidone  (MYSOLINE ) 50 MG tablet Take 50 mg by mouth daily. 09/20/16   [provider]  TOBRADEX ophthalmic ointment Place 1 Application into both eyes See admin instructions. Once every 3 days PRN 11/22/21   [provider]  triamcinolone  ointment (KENALOG) 0.1 % Apply 1 Application topically 2 (two) times daily.    [provider]  trolamine salicylate (ASPERCREME) 10 % cream Apply 1 Application topically daily as needed for muscle pain. To knees and right shoulder    [provider]  UNABLE TO FIND Take 1 capsule by mouth daily.  Ultra flora     [provider]    ___________________________________________________________________________________________________ Physical Exam:    03/13/2024   10:15 PM 03/13/2024   10:00 PM 03/13/2024    9:45 PM  Vitals with BMI  Systolic 150 155 845  Diastolic 78 76 79  Pulse 77 74 74     1. General:  in No  Acute distress    Chronically ill   -appearing 2. Psychological: Alert and   Oriented to self and situation 3. Head/ENT:    Dry Mucous Membranes                          Head Non traumatic, neck supple                           Poor Dentition 4. SKIN decreased Skin turgor,  Skin clean Dry and intact no rash    5. Heart: Regular rate and rhythm no  Murmur, no Rub or gallop 6. Lungs:   no wheezes or crackles   7. Abdomen: Soft,  non-tender, Non distended  bowel sounds present 8. Lower extremities: no clubbing, cyanosis, no  edema 9. Neurologically Grossly intact, moving all 4 extremities equally   10. MSK: Normal range of motion    Chart has been reviewed  ______________________________________________________________________________________________  Assessment/Plan  89 y.o. female with medical history significant of CVA, diastolic CHF, HTN, hypothyroidism, RBBB, RA, seizure, syncope   Admitted for  Acute cystitis without hematuria,    Disorientation, COVID-19, Generalized weakness    Present on Admission:  Acute encephalopathy  Chronic diastolic heart failure (HCC)  Hypothyroidism  Lower urinary tract infectious disease  COVID-19 virus infection  Urinary retention     Acute encephalopathy   - most likely multifactorial secondary to combination of  infection   mild dehydration secondary to decreased by mouth intake,       - treat underlining infection    Ct head pending  - neurological exam appears to be nonfocal but patient unable to cooperate fully   unremarkable   Chronic diastolic heart failure (HCC) Avoid fluid overload  History  of stroke Chronic stable  Hypothyroidism - Check TSH continue home medications Synthroid  at 100 mcg po q day   Lower urinary tract infectious disease  - treat with cefepime        await results of urine culture and adjust antibiotic coverage as needed   Seizure cerebral (HCC) Check Depakote  level  COVID-19 virus infection Supportive management  No evidence of hypoxia or pulmonary infiltrate  Urinary retention Chronic catheter replaced in ER   Other plan as per  orders.  DVT prophylaxis:  SCD      Code Status:    Code Status: Prior FULL CODE  as per patient   I had personally discussed CODE STATUS with patient   ACP   none    Family Communication:   Family  at  Bedside  plan of care was discussed   with  Daughter,   Diet  Diet Orders (From admission, onward)     Start     Ordered   03/13/24 2105  Diet NPO time specified  (Septic presentation on arrival (screening labs, nursing and treatment orders for obvious sepsis))  Diet effective now        03/13/24 2105            Disposition Plan:       To home once workup is complete and patient is stable   Following barriers for discharge:                                                         Electrolytes corrected                                                             Afebrile, white count improving able to transition to PO antibiotics                                  Consult Orders  (From admission, onward)           Start     Ordered   03/14/24 2330  Nutritional services consult  Once       Provider:  (Not yet assigned)  Question:  Reason for Consult?  Answer:  assess nutrtional status   03/13/24 2330   03/13/24 2227  Consult to hospitalist  Once       Provider:  (Not yet assigned)  Question Answer Comment  Place call to: [facility]   Reason for Consult Admit      03/13/24 2226                               Would benefit from PT/OT eval prior to DC  Ordered                     Consults called:    NONE   Admission status:  ED Disposition     ED Disposition  Admit   Condition  --   Comment  Hospital Area: [facility] [[id number]]  Level of Care: Telemetry [5]  Admit to tele based on following criteria: Other see comments  Comments: covid  May place patient in [facility] at [facility] or [facility] if equivalent level of care is available:: No  Covid Evaluation: Confirmed COVID Positive  Diagnosis: Acute encephalopathy [315562]  Admitting Physician: Braylin Xu [[id number]]  Attending Physician: Alivya Wegman [[id number]]  For patients discharging to extended facilities (i.e. SNF, AL, group homes or LTAC) initiate:: Discharge to SNF/Facility Placement COVID-19 Lab Testing  Protocol          Obs      Level of care     tele  For 12H     Iriana Artley 03/13/2024, 11:41 PM    [facility]     after 2 AM please page floor coverage   If 7AM-7PM, please contact the day team taking care of the patient using [email or web address]

## 2024-03-13 NOTE — Assessment & Plan Note (Signed)
-   Check TSH continue home medications Synthroid at 100 mcg po q day  

## 2024-03-13 NOTE — Assessment & Plan Note (Signed)
-   treat with cefepime ?      await results of urine culture and adjust antibiotic coverage as needed ? ?

## 2024-03-13 NOTE — Assessment & Plan Note (Signed)
Avoid fluid over load 

## 2024-03-13 NOTE — Assessment & Plan Note (Signed)
Check Depakote level.

## 2024-03-13 NOTE — ED Provider Notes (Signed)
 [facility]MENT AT [facility] Provider Note   CSN: [id number] Arrival date & time: 03/13/24  2008     History  Chief Complaint  Patient presents with   Code Sepsis    Anna Mccoy is a 89 y.o. female with extensive PMH as listed below who presents via [facility] as code sepsis with fever with cloudy urine from indwelling catheter. Per EMS, pt also altered - is usually a&ox4, is oriented to self and place only today. Brought in by daughter who states she noticed she was generally weak and tired last night but did not think much of it and then today she has been not acting like herself.  Patient does have an indwelling Foley catheter.  Patient denies any abdominal pain, flank pain, fall/head trauma, headache, focal neurodeficits, chest pain, cough, flulike symptoms.  EMS VS: 101.3 > 650mg  Tylenol  and 1L LR given PTA 26RR 143/82 86HR.    Past Medical History:  Diagnosis Date   Anemia    Arthritis    Cerebral infarction due to unspecified mechanism    Cholesterol retinal embolus of both eyes 09/28/2015   Chronic diarrhea of unknown origin 12/04/2015   Chronic diastolic CHF (congestive heart failure) (HCC)    CVA (cerebral infarction) 02/02/2015   DDD (degenerative disc disease), lumbar    Edema    Essential tremor 09/29/2015   Gait disturbance 06/06/2017   Hx-TIA (transient ischemic attack)    Hypertension    Hypertensive heart disease with heart failure (HCC) 01/13/2016   Hypertrophic toenail 09/28/2015   Hypothyroidism    HYPOTHYROIDISM   Impacted cerumen of right ear 01/10/2018   Kyphosis (acquired) (postural) 10/07/2016   Menopausal state age 70   Mitral valve regurgitation    TRIVIAL   Osteoporosis 09/28/2015   Peripheral neuropathy 09/28/2015   Post-menopausal bleeding 02/2011   Endo Biopsy 7/12 benign, no hyperplasia   Procidentia of uterus    uses pessary   PVC's (premature ventricular contractions)    RBBB    Rheumatoid arthritis(714.0)     Seizure cerebral (HCC)    Stroke (HCC) 1996   pt estimation   Sudden idiopathic hearing loss of right ear 01/18/2018   Syncope 11/10/2017   Vitamin D  deficiency disease        Home Medications Prior to Admission medications   Medication Sig Start Date End Date Taking? Authorizing Provider  Abatacept  (ORENCIA  [place]) Inject 1 Syringe into the skin every 30 (thirty) days.    [provider]  acetaminophen  (TYLENOL ) 500 MG tablet Take 500 mg by mouth at bedtime.    [provider]  amLODipine  (NORVASC ) 2.5 MG tablet Take 2.5 mg by mouth daily.    [provider]  aspirin  81 MG tablet Take 81 mg by mouth daily.     [provider]  atorvastatin  (LIPITOR) 20 MG tablet Take 20 mg by mouth every evening.     [provider]  Carboxymethylcell-Hypromellose 0.25-0.3 % GEL Apply to eye. 12/19/21   [provider]  carvedilol  (COREG ) 6.25 MG tablet Take 6.25 mg by mouth 2 (two) times daily. 11/18/21   [provider]  Cholecalciferol (VITAMIN D3) 50 MCG (2000 UT) CAPS Take 2,000 Units by mouth daily.    [provider]  ciprofloxacin (CIPRO) 500 MG tablet Take by mouth. 09/03/23   [provider]  Dextran 70-Hypromellose (GENTEAL TEARS) 0.1-0.3 % SOLN Apply to eye. 09/10/18   [provider]  divalproex  (DEPAKOTE ) 250 MG DR  tablet Take 1 tablet (250 mg total) by mouth every 12 (twelve) hours. 02/05/15   Samtani, Jai-Gurmukh, MD  Docusate Sodium  (COLACE PO) Take 2 tablets by mouth at bedtime.    [provider]  Ergocalciferol  10 MCG (400 UNIT) TABS Take by mouth. 12/19/21   [provider]  erythromycin ophthalmic ointment SMARTSIG:1 Sparingly In Eye(s) 3 Times Daily 09/20/22   [provider]  famotidine  (PEPCID ) 20 MG tablet Take 20 mg by mouth. 09/25/23   [provider]  Fluocinolone Acetonide Body 0.01 % OIL Apply topically daily as needed. 08/13/22   [provider]   Fluocinolone Acetonide Scalp 0.01 % OIL APPLY TOPICALLY TO THE SCALP DAILY AS NEEDED FOR FLARES 10/20/21   [provider]  fluocinonide ointment (LIDEX) 0.05 % Apply 1 Application topically 2 (two) times daily.    [provider]  folic acid  (FOLVITE ) 1 MG tablet Take 1 mg by mouth daily.    [provider]  furosemide  (LASIX ) 20 MG tablet Take 20 mg by mouth every Monday, Wednesday, and Friday. 10/20/16   [provider]  HYDROcodone -acetaminophen  (NORCO/VICODIN) 5-325 MG tablet Take 1 tablet by mouth every 4 (four) hours as needed. Patient not taking: Reported on 11/27/2023 09/20/23   [provider]  hypromellose (GENTEAL) 0.3 % GEL ophthalmic ointment Place 1 application  into both eyes See admin instructions. Gel - Every day except the days using Tobradex    [provider]  levothyroxine  (SYNTHROID ) 100 MCG tablet Take 100 mcg by mouth daily. 09/29/21   [provider]  Multiple Vitamins-Minerals (PRESERVISION AREDS PO) Take 1 capsule by mouth 2 (two) times daily.    [provider]  olmesartan  (BENICAR ) 40 MG tablet Take 40 mg by mouth daily. 11/18/21   [provider]  oxybutynin  (DITROPAN ) 5 MG tablet Take 1 tablet (5 mg total) by mouth every 8 (eight) hours as needed for bladder spasms (and foley leakage). Patient not taking: Reported on 11/27/2023 11/23/21   Summerlin, Julienne Annette, PA-C  POLY-IRON  150 150 MG capsule Take 150 mg by mouth daily.  10/13/15   [provider]  primidone  (MYSOLINE ) 50 MG tablet Take 50 mg by mouth daily. 09/20/16   [provider]  TOBRADEX ophthalmic ointment Place 1 Application into both eyes See admin instructions. Once every 3 days PRN 11/22/21   [provider]  triamcinolone  ointment (KENALOG) 0.1 % Apply 1 Application topically 2 (two) times daily.    [provider]  trolamine salicylate (ASPERCREME) 10 % cream Apply 1 Application topically daily  as needed for muscle pain. To knees and right shoulder    [provider]  UNABLE TO FIND Take 1 capsule by mouth daily. Ultra flora     [provider]      Allergies    Ace inhibitors, Bacitracin-polymyxin b, Ciprofloxacin, Lidocaine, Neosporin [neomycin-bacitracin zn-polymyx], and Sulfa antibiotics    Review of Systems   Review of Systems A 10 point review of systems was performed and is negative unless otherwise reported in HPI.  Physical Exam Updated Vital Signs BP (!) 150/78   Pulse 77   Temp (!) 101.8 F (38.8 C) (Rectal) Comment: Simultaneous filing. User may not have seen previous data. Comment (Src): Simultaneous filing. User may not have seen previous data.  Resp 15   Wt 49.9 kg   LMP 08/14/1981 (Approximate)   SpO2 99%   BMI 22.99 kg/m  Physical Exam General: Normal appearing elderly female, lying in  bed.  HEENT: PERRLA, Sclera anicteric, MMM, trachea midline.  Cardiology: RRR, no murmurs/rubs/gallops.  Resp: Normal respiratory rate and effort. CTAB, no wheezes, rhonchi, crackles.  Abd: Soft, non-tender, non-distended. No rebound tenderness or guarding.  GU: Deferred. MSK: No peripheral edema or signs of trauma. Extremities without deformity or TTP. No cyanosis or clubbing. Skin: warm, dry.  Back: No CVA tenderness Neuro: A&Ox2, CNs II-XII grossly intact. MAEs. Sensation grossly intact.  Psych: Normal mood and affect.   ED Results / Procedures / Treatments   Labs (all labs ordered are listed, but only abnormal results are displayed) Labs Reviewed  RESP PANEL BY RT-PCR (RSV, FLU A&B, COVID)  RVPGX2 - Abnormal; Notable for the following components:      Result Value   SARS Coronavirus 2 by RT PCR POSITIVE (*)    All other components within normal limits  COMPREHENSIVE METABOLIC PANEL WITH GFR - Abnormal; Notable for the following components:   Sodium 133 (*)    Glucose, Bld 100 (*)    Calcium  8.8 (*)    Total Protein 6.2 (*)    Albumin 2.8  (*)    GFR, Estimated 55 (*)    All other components within normal limits  CBC WITH DIFFERENTIAL/PLATELET - Abnormal; Notable for the following components:   RBC 3.55 (*)    Hemoglobin 11.6 (*)    HCT 34.9 (*)    Monocytes Absolute 2.1 (*)    Abs Immature Granulocytes 0.09 (*)    All other components within normal limits  URINALYSIS, W/ REFLEX TO CULTURE (INFECTION SUSPECTED) - Abnormal; Notable for the following components:   APPearance CLOUDY (*)    Protein, ur 30 (*)    Leukocytes,Ua LARGE (*)    Bacteria, UA MANY (*)    Non Squamous Epithelial 0-5 (*)    All other components within normal limits  I-STAT CG4 LACTIC ACID, ED - Abnormal; Notable for the following components:   Lactic Acid, Venous 2.7 (*)    All other components within normal limits  CULTURE, BLOOD (ROUTINE X 2)  CULTURE, BLOOD (ROUTINE X 2)  URINE CULTURE  PROTIME-INR  LACTIC ACID, PLASMA  LACTIC ACID, PLASMA  CK  MAGNESIUM  PHOSPHORUS  PROCALCITONIN  VALPROIC  ACID LEVEL  I-STAT CG4 LACTIC ACID, ED    EKG EKG Interpretation Date/Time:  Thursday March 13 2024 21:14:05 EDT Ventricular Rate:  81 PR Interval:    QRS Duration:  116 QT Interval:  415 QTC Calculation: 482 R Axis:   -78  Text Interpretation: Sinus rhythm with first degree blcok Incomplete right bundle branch block Inferior infarct, old Similar to prior Confirmed by Franklyn Gills [phone]) on 03/13/2024 9:49:09 PM  Radiology DG Chest Port 1 View if patient is in a treatment room. Result Date: 03/13/2024 CLINICAL DATA:  Fevers and possible sepsis, initial encounter EXAM: PORTABLE CHEST 1 VIEW COMPARISON:  02/22/2022 FINDINGS: The heart size and mediastinal contours are within normal limits. Both lungs are clear. The visualized skeletal structures are unremarkable. IMPRESSION: No active disease. Electronically Signed   By: Oneil Devonshire M.D.   On: 03/13/2024 21:32    Procedures Procedures    Medications Ordered in ED Medications  lactated  ringers  infusion ( Intravenous New Bag/Given 03/13/24 2157)  lactated ringers  bolus 1,000 mL (1,000 mLs Intravenous New Bag/Given 03/13/24 2157)  ceFEPIme  (MAXIPIME ) 2 g in sodium chloride  0.9 % 100 mL IVPB (2 g Intravenous New Bag/Given 03/13/24 2157)    ED Course/ Medical Decision Making/ A&P  Medical Decision Making Amount and/or Complexity of Data Reviewed Labs: ordered. Decision-making details documented in ED Course. Radiology: ordered.  Risk Prescription drug management. Decision regarding hospitalization.    This patient presents to the ED for concern of AMS, gen weakness, this involves an extensive number of treatment options, and is a complaint that carries with it a high risk of complications and morbidity.  I considered the following differential and admission for this acute, potentially life threatening condition. Overall HDS but presents febrile. Is well-appearing, NAD.  MDM:    Ddx of acute altered mental status or encephalopathy considered but not limited to: -Infection such as CAUTI  - urine positive for infection. No overt sepsis, cultures drawn. -Also found to be covid-19 positive. CXR neg for focal consolidation. - No significant electrolyte abnormalities or hyper/hypoglycemia -ACS or arrhythmia -no chest pain, EKG without acute changes -Lower c/f -Intracranial abnormalities such as ICH, hydrocephalus - will obtain CTH to r/o   Clinical Course as of 03/13/24 2244  Thu Mar 13, 2024  2104 Lactic Acid, Venous(!!): 2.7 [HN]  2148 SARS Coronavirus 2 by RT PCR(!): POSITIVE +Covid [HN]  2157 Urinalysis, w/ Reflex to Culture (Infection Suspected) -Urine, Clean Catch(!) +CAUTI, obtained from new catheter placed here in ED. Already ordered cefepime . [HN]    Clinical Course User Index [HN] Franklyn Sid SAILOR, MD    Labs: I Ordered, and personally interpreted labs.  The pertinent results include:  those listed above  Imaging Studies ordered: I  ordered imaging studies including CTH I independently visualized and interpreted imaging. I agree with the radiologist interpretation  Additional history obtained from chart review.  Reevaluation: After the interventions noted above, I reevaluated the patient and found that they have :stayed the same  Social Determinants of Health: Lives with daughter  Disposition:  Admit to hospitalist  Co morbidities that complicate the patient evaluation  Past Medical History:  Diagnosis Date   Anemia    Arthritis    Cerebral infarction due to unspecified mechanism    Cholesterol retinal embolus of both eyes 09/28/2015   Chronic diarrhea of unknown origin 12/04/2015   Chronic diastolic CHF (congestive heart failure) (HCC)    CVA (cerebral infarction) 02/02/2015   DDD (degenerative disc disease), lumbar    Edema    Essential tremor 09/29/2015   Gait disturbance 06/06/2017   Hx-TIA (transient ischemic attack)    Hypertension    Hypertensive heart disease with heart failure (HCC) 01/13/2016   Hypertrophic toenail 09/28/2015   Hypothyroidism    HYPOTHYROIDISM   Impacted cerumen of right ear 01/10/2018   Kyphosis (acquired) (postural) 10/07/2016   Menopausal state age 26   Mitral valve regurgitation    TRIVIAL   Osteoporosis 09/28/2015   Peripheral neuropathy 09/28/2015   Post-menopausal bleeding 02/2011   Endo Biopsy 7/12 benign, no hyperplasia   Procidentia of uterus    uses pessary   PVC's (premature ventricular contractions)    RBBB    Rheumatoid arthritis(714.0)    Seizure cerebral (HCC)    Stroke (HCC) 1996   pt estimation   Sudden idiopathic hearing loss of right ear 01/18/2018   Syncope 11/10/2017   Vitamin D  deficiency disease      Medicines Meds ordered this encounter  Medications   ceFEPIme  (MAXIPIME ) 2 g in sodium chloride  0.9 % 100 mL IVPB    Antibiotic Indication::   Sepsis   lactated ringers  infusion   lactated ringers  bolus 1,000 mL    Reason 30 mL/kg dose is not  being  ordered:   Non-Septic Shock Clinical Presentation    I have reviewed the patients home medicines and have made adjustments as needed  Problem List / ED Course: Problem List Items Addressed This Visit   None Visit Diagnoses       Acute cystitis without hematuria    -  Primary     Disorientation         COVID-19       Relevant Medications   ceFEPIme  (MAXIPIME ) 2 g in sodium chloride  0.9 % 100 mL IVPB (Completed)     Generalized weakness                       This note was created using dictation software, which may contain spelling or grammatical errors.    Franklyn Sid SAILOR, MD 03/13/24 2245

## 2024-03-13 NOTE — ED Triage Notes (Signed)
 Pt in from home via [facility] as code sepsis with fever with cloudy urine from indwelling catheter. Per EMS, pt also altered - is usually a&ox4, is oriented to self and place only today.   EMS VS: 101.3 > 650mg  Tylenol  and 1L LR given PTA 26RR 143/82 86HR

## 2024-03-13 NOTE — Assessment & Plan Note (Signed)
 Chronic stable.

## 2024-03-13 NOTE — Assessment & Plan Note (Signed)
 Chronic catheter replaced in ER

## 2024-03-13 NOTE — Progress Notes (Signed)
 ED Pharmacy Antibiotic Sign Off An antibiotic consult was received from an ED provider for cefepime  per pharmacy dosing for sepsis with fever/cloudy urine. A chart review was completed to assess appropriateness.   The following one time order(s) were placed:  Cefepime  2g IV x1  Further antibiotic and/or antibiotic pharmacy consults should be ordered by the admitting provider if indicated.   Thank you for allowing pharmacy to be a part of this patient's care.   Lacinda Moats, [facility]  Clinical Pharmacist 03/13/24 9:03 PM

## 2024-03-14 DIAGNOSIS — I110 Hypertensive heart disease with heart failure: Secondary | ICD-10-CM | POA: Diagnosis present

## 2024-03-14 DIAGNOSIS — A419 Sepsis, unspecified organism: Secondary | ICD-10-CM | POA: Insufficient documentation

## 2024-03-14 DIAGNOSIS — H9191 Unspecified hearing loss, right ear: Secondary | ICD-10-CM | POA: Diagnosis present

## 2024-03-14 DIAGNOSIS — J1282 Pneumonia due to coronavirus disease 2019: Secondary | ICD-10-CM | POA: Diagnosis present

## 2024-03-14 DIAGNOSIS — U071 COVID-19: Secondary | ICD-10-CM | POA: Diagnosis present

## 2024-03-14 DIAGNOSIS — E785 Hyperlipidemia, unspecified: Secondary | ICD-10-CM | POA: Diagnosis present

## 2024-03-14 DIAGNOSIS — A4189 Other specified sepsis: Secondary | ICD-10-CM | POA: Diagnosis present

## 2024-03-14 DIAGNOSIS — E876 Hypokalemia: Secondary | ICD-10-CM | POA: Diagnosis present

## 2024-03-14 DIAGNOSIS — G9340 Encephalopathy, unspecified: Secondary | ICD-10-CM | POA: Diagnosis not present

## 2024-03-14 DIAGNOSIS — G40909 Epilepsy, unspecified, not intractable, without status epilepticus: Secondary | ICD-10-CM | POA: Diagnosis present

## 2024-03-14 DIAGNOSIS — E860 Dehydration: Secondary | ICD-10-CM | POA: Diagnosis present

## 2024-03-14 DIAGNOSIS — M4000 Postural kyphosis, site unspecified: Secondary | ICD-10-CM | POA: Diagnosis present

## 2024-03-14 DIAGNOSIS — R6520 Severe sepsis without septic shock: Secondary | ICD-10-CM | POA: Diagnosis present

## 2024-03-14 DIAGNOSIS — R410 Disorientation, unspecified: Secondary | ICD-10-CM | POA: Diagnosis present

## 2024-03-14 DIAGNOSIS — Z79899 Other long term (current) drug therapy: Secondary | ICD-10-CM | POA: Diagnosis not present

## 2024-03-14 DIAGNOSIS — R339 Retention of urine, unspecified: Secondary | ICD-10-CM | POA: Diagnosis present

## 2024-03-14 DIAGNOSIS — Z79890 Hormone replacement therapy (postmenopausal): Secondary | ICD-10-CM | POA: Diagnosis not present

## 2024-03-14 DIAGNOSIS — G9341 Metabolic encephalopathy: Secondary | ICD-10-CM | POA: Diagnosis present

## 2024-03-14 DIAGNOSIS — E039 Hypothyroidism, unspecified: Secondary | ICD-10-CM | POA: Diagnosis present

## 2024-03-14 DIAGNOSIS — M069 Rheumatoid arthritis, unspecified: Secondary | ICD-10-CM | POA: Diagnosis present

## 2024-03-14 DIAGNOSIS — R8271 Bacteriuria: Secondary | ICD-10-CM | POA: Diagnosis present

## 2024-03-14 DIAGNOSIS — I5032 Chronic diastolic (congestive) heart failure: Secondary | ICD-10-CM | POA: Diagnosis present

## 2024-03-14 DIAGNOSIS — Z7982 Long term (current) use of aspirin: Secondary | ICD-10-CM | POA: Diagnosis not present

## 2024-03-14 DIAGNOSIS — M810 Age-related osteoporosis without current pathological fracture: Secondary | ICD-10-CM | POA: Diagnosis present

## 2024-03-14 DIAGNOSIS — Z8249 Family history of ischemic heart disease and other diseases of the circulatory system: Secondary | ICD-10-CM | POA: Diagnosis not present

## 2024-03-14 DIAGNOSIS — I340 Nonrheumatic mitral (valve) insufficiency: Secondary | ICD-10-CM | POA: Diagnosis present

## 2024-03-14 DIAGNOSIS — E43 Unspecified severe protein-calorie malnutrition: Secondary | ICD-10-CM | POA: Diagnosis present

## 2024-03-14 LAB — COMPREHENSIVE METABOLIC PANEL WITH GFR
ALT: 19 U/L (ref 0–44)
AST: 31 U/L (ref 15–41)
Albumin: 2.4 g/dL — ABNORMAL LOW (ref 3.5–5.0)
Alkaline Phosphatase: 40 U/L (ref 38–126)
Anion gap: 10 (ref 5–15)
BUN: 15 mg/dL (ref 8–23)
CO2: 21 mmol/L — ABNORMAL LOW (ref 22–32)
Calcium: 8 mg/dL — ABNORMAL LOW (ref 8.9–10.3)
Chloride: 101 mmol/L (ref 98–111)
Creatinine, Ser: 0.78 mg/dL (ref 0.44–1.00)
GFR, Estimated: >60 mL/min (ref >60)
Glucose, Bld: 160 mg/dL — ABNORMAL HIGH (ref 70–99)
Potassium: 3 mmol/L — ABNORMAL LOW (ref 3.5–5.1)
Sodium: 132 mmol/L — ABNORMAL LOW (ref 135–145)
Total Bilirubin: 0.4 mg/dL (ref 0.0–1.2)
Total Protein: 5.3 g/dL — ABNORMAL LOW (ref 6.5–8.1)

## 2024-03-14 LAB — CBC
HCT: 33.3 % — ABNORMAL LOW (ref 36.0–46.0)
Hemoglobin: 10.5 g/dL — ABNORMAL LOW (ref 12.0–15.0)
MCH: 32.2 pg (ref 26.0–34.0)
MCHC: 31.5 g/dL (ref 30.0–36.0)
MCV: 102.1 fL — ABNORMAL HIGH (ref 80.0–100.0)
Platelets: 156 K/uL (ref 150–400)
RBC: 3.26 MIL/uL — ABNORMAL LOW (ref 3.87–5.11)
RDW: 14.9 % (ref 11.5–15.5)
WBC: 7.4 K/uL (ref 4.0–10.5)
nRBC: 0 % (ref 0.0–0.2)

## 2024-03-14 LAB — VALPROIC ACID LEVEL: Valproic Acid Lvl: 41 ug/mL — ABNORMAL LOW (ref 50–100)

## 2024-03-14 LAB — LACTIC ACID, PLASMA
Lactic Acid, Venous: 1.6 mmol/L (ref 0.5–1.9)
Lactic Acid, Venous: 3 mmol/L (ref 0.5–1.9)
Lactic Acid, Venous: 2.4 mmol/L (ref 0.5–1.9)
Lactic Acid, Venous: 1.7 mmol/L (ref 0.5–1.9)

## 2024-03-14 LAB — PROCALCITONIN: Procalcitonin: 0.23 ng/mL

## 2024-03-14 LAB — SODIUM, URINE, RANDOM: Sodium, Ur: 37 mmol/L

## 2024-03-14 LAB — OSMOLALITY, URINE: Osmolality, Ur: 251 mosm/kg — ABNORMAL LOW (ref 300–900)

## 2024-03-14 LAB — OSMOLALITY: Osmolality: 284 mosm/kg (ref 275–295)

## 2024-03-14 LAB — MAGNESIUM: Magnesium: 1.8 mg/dL (ref 1.7–2.4)

## 2024-03-14 LAB — PHOSPHORUS: Phosphorus: 3.5 mg/dL (ref 2.5–4.6)

## 2024-03-14 MED ORDER — HEPARIN SODIUM (PORCINE) 5000 UNIT/ML IJ SOLN
5000.0000 [IU] | Freq: Three times a day (TID) | INTRAMUSCULAR | Status: DC
  Administered 2024-03-14 – 2024-03-16 (×7): 5000 [IU] via SUBCUTANEOUS
  Filled 2024-03-14 (×7): qty 1

## 2024-03-14 MED ORDER — ALBUTEROL SULFATE (2.5 MG/3ML) 0.083% IN NEBU: 2.5000 mg | INHALATION_SOLUTION | RESPIRATORY_TRACT | Status: DC | PRN

## 2024-03-14 MED ORDER — AMLODIPINE BESYLATE 5 MG PO TABS
5.0000 mg | ORAL_TABLET | Freq: Every day | ORAL | Status: DC
Start: 2024-03-14 — End: 2024-03-14
  Administered 2024-03-14: 5 mg via ORAL
  Filled 2024-03-14: qty 1

## 2024-03-14 MED ORDER — GUAIFENESIN ER 600 MG PO TB12
600.0000 mg | ORAL_TABLET | Freq: Two times a day (BID) | ORAL | Status: DC
  Administered 2024-03-14 – 2024-03-16 (×6): 600 mg via ORAL
  Filled 2024-03-14 (×6): qty 1

## 2024-03-14 MED ORDER — SODIUM CHLORIDE 0.9 % IV SOLN: INTRAVENOUS | Status: DC

## 2024-03-14 MED ORDER — ACETAMINOPHEN 325 MG PO TABS
650.0000 mg | ORAL_TABLET | Freq: Four times a day (QID) | ORAL | Status: DC | PRN
  Administered 2024-03-14 – 2024-03-16 (×3): 650 mg via ORAL
  Filled 2024-03-14 (×3): qty 2

## 2024-03-14 MED ORDER — SODIUM CHLORIDE 0.9 % IV BOLUS
500.0000 mL | Freq: Once | INTRAVENOUS | Status: AC
  Administered 2024-03-14: 500 mL via INTRAVENOUS

## 2024-03-14 MED ORDER — FAMOTIDINE 20 MG PO TABS
20.0000 mg | ORAL_TABLET | Freq: Every day | ORAL | Status: DC
  Administered 2024-03-14 – 2024-03-16 (×3): 20 mg via ORAL
  Filled 2024-03-14 (×3): qty 1

## 2024-03-14 MED ORDER — THIAMINE MONONITRATE 100 MG PO TABS
100.0000 mg | ORAL_TABLET | Freq: Every day | ORAL | Status: DC
  Administered 2024-03-14 – 2024-03-16 (×3): 100 mg via ORAL
  Filled 2024-03-14 (×3): qty 1

## 2024-03-14 MED ORDER — LEVOTHYROXINE SODIUM 75 MCG PO TABS
75.0000 ug | ORAL_TABLET | Freq: Every day | ORAL | Status: DC
  Administered 2024-03-14 – 2024-03-16 (×3): 75 ug via ORAL
  Filled 2024-03-14 (×3): qty 1

## 2024-03-14 MED ORDER — DIVALPROEX SODIUM 250 MG PO DR TAB
250.0000 mg | DELAYED_RELEASE_TABLET | Freq: Two times a day (BID) | ORAL | Status: DC
Start: 2024-03-14 — End: 2024-03-16
  Administered 2024-03-14 – 2024-03-16 (×6): 250 mg via ORAL
  Filled 2024-03-14 (×6): qty 1

## 2024-03-14 MED ORDER — ONDANSETRON HCL 4 MG/2ML IJ SOLN: 4.0000 mg | Freq: Four times a day (QID) | INTRAMUSCULAR | Status: DC | PRN

## 2024-03-14 MED ORDER — ACETAMINOPHEN 650 MG RE SUPP
650.0000 mg | Freq: Four times a day (QID) | RECTAL | Status: DC | PRN
Start: 2024-03-14 — End: 2024-03-16

## 2024-03-14 MED ORDER — FOLIC ACID 1 MG PO TABS
1.0000 mg | ORAL_TABLET | Freq: Every day | ORAL | Status: DC
  Administered 2024-03-14 – 2024-03-16 (×3): 1 mg via ORAL
  Filled 2024-03-14 (×3): qty 1

## 2024-03-14 MED ORDER — CARVEDILOL 6.25 MG PO TABS
6.2500 mg | ORAL_TABLET | Freq: Two times a day (BID) | ORAL | Status: DC
  Administered 2024-03-14 – 2024-03-16 (×5): 6.25 mg via ORAL
  Filled 2024-03-14 (×6): qty 1

## 2024-03-14 MED ORDER — PRIMIDONE 50 MG PO TABS
50.0000 mg | ORAL_TABLET | Freq: Every day | ORAL | Status: DC
  Administered 2024-03-14 – 2024-03-16 (×3): 50 mg via ORAL
  Filled 2024-03-14 (×3): qty 1

## 2024-03-14 MED ORDER — AMLODIPINE BESYLATE 5 MG PO TABS
2.5000 mg | ORAL_TABLET | Freq: Once | ORAL | Status: AC
  Administered 2024-03-14: 2.5 mg via ORAL
  Filled 2024-03-14: qty 1

## 2024-03-14 MED ORDER — AMLODIPINE BESYLATE 5 MG PO TABS: 2.5000 mg | ORAL_TABLET | Freq: Every day | ORAL | Status: DC

## 2024-03-14 MED ORDER — POTASSIUM CHLORIDE CRYS ER 20 MEQ PO TBCR
40.0000 meq | EXTENDED_RELEASE_TABLET | ORAL | Status: AC
  Administered 2024-03-14 (×2): 40 meq via ORAL
  Filled 2024-03-14 (×2): qty 2

## 2024-03-14 MED ORDER — ONDANSETRON HCL 4 MG PO TABS: 4.0000 mg | ORAL_TABLET | Freq: Four times a day (QID) | ORAL | Status: DC | PRN

## 2024-03-14 MED ORDER — ATORVASTATIN CALCIUM 20 MG PO TABS
20.0000 mg | ORAL_TABLET | Freq: Every evening | ORAL | Status: DC
  Administered 2024-03-14 – 2024-03-15 (×2): 20 mg via ORAL
  Filled 2024-03-14 (×2): qty 1

## 2024-03-14 MED ORDER — ASPIRIN 81 MG PO CHEW
81.0000 mg | CHEWABLE_TABLET | Freq: Every day | ORAL | Status: DC
  Administered 2024-03-14 – 2024-03-16 (×3): 81 mg via ORAL
  Filled 2024-03-14 (×3): qty 1

## 2024-03-14 MED ORDER — CHLORHEXIDINE GLUCONATE CLOTH 2 % EX PADS
6.0000 | MEDICATED_PAD | Freq: Every day | CUTANEOUS | Status: DC
  Administered 2024-03-15 – 2024-03-16 (×2): 6 via TOPICAL

## 2024-03-14 MED ORDER — ADULT MULTIVITAMIN W/MINERALS CH
1.0000 | ORAL_TABLET | Freq: Every day | ORAL | Status: DC
  Administered 2024-03-14 – 2024-03-16 (×3): 1 via ORAL
  Filled 2024-03-14 (×3): qty 1

## 2024-03-14 MED ORDER — DOCUSATE SODIUM 100 MG PO CAPS
100.0000 mg | ORAL_CAPSULE | Freq: Two times a day (BID) | ORAL | Status: DC
  Administered 2024-03-14 – 2024-03-16 (×5): 100 mg via ORAL
  Filled 2024-03-14 (×5): qty 1

## 2024-03-14 MED ORDER — LEVOTHYROXINE SODIUM 100 MCG PO TABS: 100.0000 ug | ORAL_TABLET | Freq: Every day | ORAL | Status: DC

## 2024-03-14 MED ORDER — SODIUM CHLORIDE 0.9 % IV SOLN: INTRAVENOUS | Status: AC

## 2024-03-14 MED ORDER — ENSURE PLUS HIGH PROTEIN PO LIQD
237.0000 mL | Freq: Three times a day (TID) | ORAL | Status: DC
  Administered 2024-03-14 – 2024-03-16 (×5): 237 mL via ORAL

## 2024-03-14 NOTE — Progress Notes (Signed)
 PROGRESS NOTE    Anna Mccoy  FMW:[id number] DOB: [birth year] DOA: 03/13/2024 PCP: Lazoff, Shawn P, DO   Brief Narrative:  Anna Mccoy is a 89 y.o. female with medical history significant of CVA, diastolic CHF, HTN, hypothyroidism, RBBB, RA, seizure, was brought into the ED due to confusion as well as fever.  At baseline, patient is alert and oriented x 4.  Upon arrival to ED, she was febrile but blood pressure normal.  She was tested positive for COVID-19 pneumonia.  Chest x-ray unremarkable and CT head unremarkable.  Patient was presumed to be having UTI, started on cefepime , admitted to hospital service.  Assessment & Plan:   Principal Problem:   Acute encephalopathy Active Problems:   Hypothyroidism   Seizure cerebral (HCC)   Chronic diastolic heart failure (HCC)   History of stroke   COVID-19 virus infection   Urinary retention   Severe sepsis (HCC)  Severe sepsis secondary to COVID-19 pneumonia, POA: Patient met criteria for severe sepsis based on fever, tachypnea and lactic acid of> 2.  Patient received some IV fluids in the ED, lactic acid initially normalized from 2.7 but then got worse to 3.0.  She received some more IV fluids.  Lactic acid pending this morning.  Chest x-ray normal and patient is not hypoxic, no indication of antivirals or steroids.  Treat symptomatically.  Bronchodilators, patient was encouraged to prone, out of bed to chair, to use incentive spirometry and flutter valve.  Acute septic/infectious encephalopathy: Patient is now fully alert and oriented, verified by the daughter at the bedside that she is at baseline.  Asymptomatic bacteriuria/colonization: Patient's UA is not impressive and with patient having no urinary complaints, this is not true UTI Instat this is asymptomatic bacteriuria and does not require treatment, will not recommend antibiotics.  Hypokalemia: Will replenish.  Acute hyponatremia: Mild and patient asymptomatic.  Likely due  to SIADH.  Will start the workup.  Essential hypertension: Blood pressure on the low side, hold amlodipine  and olmesartan , continue Coreg .  Acquired hypothyroidism: Continue Synthroid .  Hyperlipidemia: Continue Lipitor.  DVT prophylaxis: SCDs Start: 03/14/24 0123   Code Status: Full Code  Family Communication: Daughter present at bedside.  Plan of care discussed with patient in length and he/she verbalized understanding and agreed with it.  Status is: Observation The patient will require care spanning > 2 midnights and should be moved to inpatient because: Recovering but needs to be afebrile for 24 hours before discharge.   Estimated body mass index is 22.99 kg/m as calculated from the following:   Height as of 12/08/23: 4' 10 (1.473 m).   Weight as of this encounter: 49.9 kg.    Nutritional Assessment: Body mass index is 22.99 kg/m.SABRA Seen by dietician.  I agree with the assessment and plan as outlined below: Nutrition Status:        . Skin Assessment: I have examined the patient's skin and I agree with the wound assessment as performed by the wound care RN as outlined below:    Consultants:  None  Procedures:  None  Antimicrobials:  Anti-infectives (From admission, onward)    Start     Dose/Rate Route Frequency Ordered Stop   03/13/24 2130  ceFEPIme  (MAXIPIME ) 2 g in sodium chloride  0.9 % 100 mL IVPB        2 g 200 mL/hr over 30 Minutes Intravenous  Once 03/13/24 2104 03/13/24 2227         Subjective: Patient seen and examined, daughter at the  bedside.  Patient has no complaints.  She is fully alert and oriented.  She denies any shortness of breath or any suprapubic pain or urinary complaints.  Objective: Vitals:   03/13/24 2215 03/14/24 0047 03/14/24 0450 03/14/24 0458  BP: (!) 150/78 (!) 167/92 (!) 92/56 111/70  Pulse: 77 81 66 64  Resp: 15 17 17    Temp:  99.5 F (37.5 C) (!) 97.5 F (36.4 C)   TempSrc:  Oral    SpO2: 99% 100% 100%   Weight:         Intake/Output Summary (Last 24 hours) at 03/14/2024 0750 Last data filed at 03/14/2024 0200 Gross per 24 hour  Intake 2114.76 ml  Output 1125 ml  Net 989.76 ml   Filed Weights   03/13/24 2027  Weight: 49.9 kg    Examination:  General exam: Appears calm and comfortable  Respiratory system: Clear to auscultation. Respiratory effort normal. Cardiovascular system: S1 & S2 heard, RRR. No JVD, murmurs, rubs, gallops or clicks. No pedal edema. Gastrointestinal system: Abdomen is nondistended, soft and nontender. No organomegaly or masses felt. Normal bowel sounds heard. Central nervous system: Alert and oriented. No focal neurological deficits. Extremities: Symmetric 5 x 5 power. Skin: No rashes, lesions or ulcers Psychiatry: Judgement and insight appear normal. Mood & affect appropriate.    Data Reviewed: I have personally reviewed following labs and imaging studies  CBC: Recent Labs  Lab 03/13/24 2048 03/14/24 0458  WBC 10.0 7.4  NEUTROABS 5.1  --   HGB 11.6* 10.5*  HCT 34.9* 33.3*  MCV 98.3 102.1*  PLT 188 156   Basic Metabolic Panel: Recent Labs  Lab 03/13/24 2048 03/14/24 0458  NA 133* 132*  K 3.8 3.0*  CL 100 101  CO2 24 21*  GLUCOSE 100* 160*  BUN 17 15  CREATININE 0.95 0.78  CALCIUM  8.8* 8.0*  MG 2.0 1.8  PHOS 2.4* 3.5   GFR: Estimated Creatinine Clearance: 28.9 mL/min (by C-G formula based on SCr of 0.78 mg/dL). Liver Function Tests: Recent Labs  Lab 03/13/24 2048 03/14/24 0458  AST 38 31  ALT 24 19  ALKPHOS 48 40  BILITOT 0.4 0.4  PROT 6.2* 5.3*  ALBUMIN 2.8* 2.4*   No results for input(s): LIPASE, AMYLASE in the last 168 hours. No results for input(s): AMMONIA in the last 168 hours. Coagulation Profile: Recent Labs  Lab 03/13/24 2048  INR 1.1   Cardiac Enzymes: Recent Labs  Lab 03/13/24 2048  CKTOTAL 74   BNP (last 3 results) No results for input(s): PROBNP in the last 8760 hours. HbA1C: No results for input(s):  HGBA1C in the last 72 hours. CBG: No results for input(s): GLUCAP in the last 168 hours. Lipid Profile: No results for input(s): CHOL, HDL, LDLCALC, TRIG, CHOLHDL, LDLDIRECT in the last 72 hours. Thyroid  Function Tests: No results for input(s): TSH, T4TOTAL, FREET4, T3FREE, THYROIDAB in the last 72 hours. Anemia Panel: No results for input(s): VITAMINB12, FOLATE, FERRITIN, TIBC, IRON , RETICCTPCT in the last 72 hours. Sepsis Labs: Recent Labs  Lab 03/13/24 2048 03/13/24 2059 03/14/24 0115 03/14/24 0458  PROCALCITON 0.23  --   --   --   LATICACIDVEN  --  2.7* 1.6 3.0*    Recent Results (from the past 240 hours)  Culture, blood (Routine x 2)     Status: None (Preliminary result)   Collection Time: 03/13/24  8:48 PM   Specimen: BLOOD LEFT FOREARM  Result Value Ref Range Status   Specimen Description  Final    BLOOD LEFT FOREARM Performed at [facility], [address], [place], [place] [address]    Special Requests   Final    BOTTLES DRAWN AEROBIC AND ANAEROBIC Blood Culture results may not be optimal due to an inadequate volume of blood received in culture bottles Performed at [facility], [address] [place], [place] [address]    Culture PENDING  Incomplete   Report Status PENDING  Incomplete  Resp panel by RT-PCR (RSV, Flu A&B, Covid) Anterior Nasal Swab     Status: Abnormal   Collection Time: 03/13/24  9:02 PM   Specimen: Anterior Nasal Swab  Result Value Ref Range Status   SARS Coronavirus 2 by RT PCR POSITIVE (A) NEGATIVE Final    Comment: (NOTE) SARS-CoV-2 target nucleic acids are DETECTED.  The SARS-CoV-2 RNA is generally detectable in upper respiratory specimens during the acute phase of infection. Positive results are indicative of the presence of the identified virus, but do not rule out bacterial infection or co-infection with other pathogens not detected by the test. Clinical correlation with  patient history and other diagnostic information is necessary to determine patient infection status. The expected result is Negative.  Fact Sheet for Patients: BloggerCourse.com  Fact Sheet for Healthcare Providers: SeriousBroker.it  This test is not yet approved or cleared by the [place]  FDA and  has been authorized for detection and/or diagnosis of SARS-CoV-2 by FDA under an Emergency Use Authorization (EUA).  This EUA will remain in effect (meaning this test can be used) for the duration of  the COVID-19 declaration under Section 564(b)(1) of the A ct, 21 U.S.C. section 360bbb-3(b)(1), unless the authorization is terminated or revoked sooner.     Influenza A by PCR NEGATIVE NEGATIVE Final   Influenza B by PCR NEGATIVE NEGATIVE Final    Comment: (NOTE) The Xpert Xpress SARS-CoV-2/FLU/RSV plus assay is intended as an aid in the diagnosis of influenza from Nasopharyngeal swab specimens and should not be used as a sole basis for treatment. Nasal washings and aspirates are unacceptable for Xpert Xpress SARS-CoV-2/FLU/RSV testing.  Fact Sheet for Patients: BloggerCourse.com  Fact Sheet for Healthcare Providers: SeriousBroker.it  This test is not yet approved or cleared by the [place]  FDA and has been authorized for detection and/or diagnosis of SARS-CoV-2 by FDA under an Emergency Use Authorization (EUA). This EUA will remain in effect (meaning this test can be used) for the duration of the COVID-19 declaration under Section 564(b)(1) of the Act, 21 U.S.C. section 360bbb-3(b)(1), unless the authorization is terminated or revoked.     Resp Syncytial Virus by PCR NEGATIVE NEGATIVE Final    Comment: (NOTE) Fact Sheet for Patients: BloggerCourse.com  Fact Sheet for Healthcare Providers: SeriousBroker.it  This test is  not yet approved or cleared by the [place]  FDA and has been authorized for detection and/or diagnosis of SARS-CoV-2 by FDA under an Emergency Use Authorization (EUA). This EUA will remain in effect (meaning this test can be used) for the duration of the COVID-19 declaration under Section 564(b)(1) of the Act, 21 U.S.C. section 360bbb-3(b)(1), unless the authorization is terminated or revoked.  Performed at [facility], [address] [place], [place] [address]      Radiology Studies: CT Head Wo Contrast Result Date: 03/14/2024 CLINICAL DATA:  Headaches and fever EXAM: CT HEAD WITHOUT CONTRAST TECHNIQUE: Contiguous axial images were obtained from the base of the skull through the vertex without intravenous contrast. RADIATION DOSE  REDUCTION: This exam was performed according to the departmental dose-optimization program which includes automated exposure control, adjustment of the mA and/or kV according to patient size and/or use of iterative reconstruction technique. COMPARISON:  02/17/2022 FINDINGS: Brain: No evidence of acute infarction, hemorrhage, hydrocephalus, extra-axial collection or mass lesion/mass effect. Chronic atrophic and ischemic changes are noted. Vascular: No hyperdense vessel or unexpected calcification. Skull: Normal. Negative for fracture or focal lesion. Sinuses/Orbits: No acute finding. Other: None. IMPRESSION: Chronic atrophic and ischemic changes without acute abnormality. Electronically Signed   By: Oneil Devonshire M.D.   On: 03/14/2024 00:11   DG Chest Port 1 View if patient is in a treatment room. Result Date: 03/13/2024 CLINICAL DATA:  Fevers and possible sepsis, initial encounter EXAM: PORTABLE CHEST 1 VIEW COMPARISON:  02/22/2022 FINDINGS: The heart size and mediastinal contours are within normal limits. Both lungs are clear. The visualized skeletal structures are unremarkable. IMPRESSION: No active disease. Electronically Signed   By: Oneil Devonshire M.D.   On: 03/13/2024 21:32    Scheduled Meds:  amLODipine   5 mg Oral QHS   aspirin   81 mg Oral Daily   atorvastatin   20 mg Oral QPM   carvedilol   6.25 mg Oral BID   divalproex   250 mg Oral Q12H   famotidine   20 mg Oral Daily   folic acid   1 mg Oral Daily   guaiFENesin  600 mg Oral BID   levothyroxine   75 mcg Oral Q0600   potassium chloride   40 mEq Oral Q4H   primidone   50 mg Oral Daily   Continuous Infusions:  sodium chloride  50 mL/hr at 03/14/24 0200     LOS: 0 days   Fredia Skeeter, MD [facility]  03/14/2024, 7:50 AM   *Please note that this is a verbal dictation therefore any spelling or grammatical errors are due to the Dragon Medical One system interpretation.  Please page via Amion and do not message via secure chat for urgent patient care matters. Secure chat can be used for non urgent patient care matters.  How to contact the [facility] Attending or Consulting provider 7A - 7P or covering provider during after hours 7P -7A, for this patient?  Check the care team in [facility] and look for a) attending/consulting [facility] provider listed and b) the [facility] team listed. Page or secure chat 7A-7P. Log into [email or web address] and use [place]'s universal password to access. If you do not have the password, please contact the hospital operator. Locate the [facility] provider you are looking for under [facility] and page to a number that you can be directly reached. If you still have difficulty reaching the provider, please page the [facility] (Director on Call) for the Hospitalists listed on amion for assistance.

## 2024-03-14 NOTE — Evaluation (Signed)
 Occupational Therapy Evaluation Patient Details Name: Anna Mccoy MRN: [id number] DOB: 1925/03/01 Today's Date: 03/14/2024   History of Present Illness   Anna Mccoy is a 89 yr old female who presented with confusion and fever. She was found to have COVID-19. PMH: CVA, diastolic CHF, HTN, hypothyroidism, RBBB, RA, seizure     Clinical Impressions The pt is currently presenting with the below listed deficits (see OT problem list). During the session, she required mod assist for supine to sit, min assist to stand from an elevated bed surface using a RW,  min assist to step-pivot to the chair using a RW, and set-up assist for upper body grooming seated in the chair. Her O2 saturation was noted to be 95% on room air with activity. She has chronic generalized discomfort, due to rheumatoid arthritis. The pt has good family support and home health aide services 4 days per week. Her daughter plans to take the pt home with family support and home health therapy at discharge. OT will follow the pt for services in the acute care setting to maximize her ADL performance and to decrease the risk for further weakness and deconditioning.      If plan is discharge home, recommend the following:   Assist for transportation;Assistance with cooking/housework;Help with stairs or ramp for entrance;A lot of help with bathing/dressing/bathroom     Functional Status Assessment   Patient has had a recent decline in their functional status and demonstrates the ability to make significant improvements in function in a reasonable and predictable amount of time.     Equipment Recommendations   None recommended by OT     Recommendations for Other Services         Precautions/Restrictions   Restrictions Weight Bearing Restrictions Per Provider Order: No     Mobility Bed Mobility Overal bed mobility: Needs Assistance Bed Mobility: Supine to Sit     Supine to sit: Mod assist, Used  rails, HOB elevated     General bed mobility comments:  (required assist to advance BLE off bed)    Transfers Overall transfer level: Needs assistance Equipment used: Rolling walker (2 wheels) Transfers: Sit to/from Stand, Bed to chair/wheelchair/BSC Sit to Stand: Min assist, From elevated surface     Step pivot transfers: Min assist            Balance       Sitting balance - Comments: static sitting-good. dynamic sitting-fair+       Standing balance comment: Min assist with RW           ADL either performed or assessed with clinical judgement   ADL Overall ADL's : Needs assistance/impaired Eating/Feeding: Set up;Sitting   Grooming: Set up;Sitting           Upper Body Dressing : Minimal assistance;Sitting   Lower Body Dressing: Moderate assistance;Sitting/lateral leans       Toileting- Clothing Manipulation and Hygiene: Maximal assistance Toileting - Clothing Manipulation Details (indicate cue type and reason): at bedside commode level, based on clinical judgement             Vision Baseline Vision/History: 1 Wears glasses              Pertinent Vitals/Pain Pain Assessment Pain Assessment: Faces Pain Score: 2  Pain Location: chronic pain due to RA Pain Intervention(s): Limited activity within patient's tolerance, Monitored during session, Repositioned     Extremity/Trunk Assessment Upper Extremity Assessment Upper Extremity Assessment: Right hand dominant;LUE deficits/detail;RUE deficits/detail RUE Deficits /  Details: Chronic arthritic changes of hands, due to RA. Elbow and shoulder AROM WFL. Impaired fine motor coordination, due to RA. RUE Coordination: decreased fine motor LUE Deficits / Details: Chronic arthritic changes of hands, due to RA. Elbow and shoulder AROM WFL. Impaired fine motor coordination, due to RA. LUE Coordination: decreased fine motor   Lower Extremity Assessment Lower Extremity Assessment: Generalized weakness       Communication Communication Communication: Impaired Factors Affecting Communication: Hearing impaired   Cognition Arousal: Alert Behavior During Therapy: WFL for tasks assessed/performed Cognition: No apparent impairments             OT - Cognition Comments: Oriented to person, place, month, and situation. Disoriented to year.          Following commands: Intact       Cueing  General Comments      Pt's O2 saturation was 95% on room air with activity            Home Living Family/patient expects to be discharged to:: Private residence Living Arrangements: Children (2 daughters) Available Help at Discharge: Family;Available 24 hours/day;Personal care attendant Type of Home: House Home Access: Ramped entrance     Home Layout: One level     Bathroom Shower/Tub: Tub/shower unit         Home Equipment: Rollator (4 wheels);Rolling Walker (2 wheels);Transport chair   Additional Comments: pt lives with 2 daughters, another daughter lives across the street, home aide comes M,W,F, and Sat for 2hrs/day - assisted pt out of bed, with showers, and making breakfast      Prior Functioning/Environment Prior Level of Function : Needs assist             Mobility Comments:  (She ambulated short household distances with a rollator and supervision to CGA. Transport chair was used for mobility outside the home.) ADLs Comments:  (The pt required assist for showers. When her aide isn't there, the pt performed spongebaths seated at the sink. The aide or her kids lay her clothes out of her and she is able to donn them. They also wheeled her to the bathroom in a transport chair, and occasionally assisted her with transfers onto and off the toilet.     OT Problem List: Decreased strength;Impaired balance (sitting and/or standing);Decreased coordination;Pain;Decreased activity tolerance   OT Treatment/Interventions: Self-care/ADL training;Therapeutic exercise;Therapeutic  activities;Energy conservation;Patient/family education;DME and/or AE instruction;Balance training      OT Goals(Current goals can be found in the care plan section)   Acute Rehab OT Goals Patient Stated Goal: the pt's daughter desires to take the pt home at discharge OT Goal Formulation: With patient/family Time For Goal Achievement: 03/28/24 Potential to Achieve Goals: Good ADL Goals Pt Will Perform Upper Body Dressing: with set-up;sitting Pt Will Transfer to Toilet: stand pivot transfer;bedside commode;with contact guard assist Pt Will Perform Toileting - Clothing Manipulation and hygiene: with contact guard assist;sit to/from stand Additional ADL Goal #1: The pt will perform bed mobility with CGA, in prep for progressive ADL participation.   OT Frequency:  Min 2X/week       AM-PAC OT 6 Clicks Daily Activity     Outcome Measure Help from another person eating meals?: None Help from another person taking care of personal grooming?: A Little Help from another person toileting, which includes using toliet, bedpan, or urinal?: A Lot Help from another person bathing (including washing, rinsing, drying)?: A Lot Help from another person to put on and taking off regular upper body clothing?:  A Little Help from another person to put on and taking off regular lower body clothing?: A Lot 6 Click Score: 16   End of Session Equipment Utilized During Treatment: Rolling walker (2 wheels);Gait belt Nurse Communication: Mobility status  Activity Tolerance: Patient tolerated treatment well Patient left: in chair;with call bell/phone within reach;with family/visitor present;with chair alarm set  OT Visit Diagnosis: Unsteadiness on feet (R26.81);Pain;Muscle weakness (generalized) (M62.81);Other abnormalities of gait and mobility (R26.89)                Time: 8691-8664 OT Time Calculation (min): 27 min Charges:  OT General Charges $OT Visit: 1 Visit OT Evaluation $OT Eval Moderate  Complexity: 1 Mod OT Treatments $Therapeutic Activity: 8-22 mins   Delanna LITTIE Molt, OTR/L 03/14/2024, 3:57 PM

## 2024-03-14 NOTE — Evaluation (Signed)
 Physical Therapy Evaluation Patient Details Name: Anna Mccoy MRN: [id number] DOB: [birth year] Today's Date: 03/14/2024  History of Present Illness  Anna Mccoy is a 89 y.o. female presents with confusion and fever; positive for covid19. PMH: CVA, diastolic CHF, HTN, hypothyroidism, RBBB, RA, seizure  Clinical Impression  Pt admitted with above diagnosis. Pt from home with 2 daughters and middle daughter lives across the street, ramped entry, family and aide assisting with bed mobility and self care tasks, 24/7 supv. Pt has aide MWFSat for 2 hours, walks short distances in home with rollator walker. On eval, pt demonstrates generalized weakness, decreased activity tolerance and needing cues for RW management and mobility. Pt completes STS reps from recliner with mod A to power up, min A for step pivot to bed. Pt able to engage in static marching but fatigues easily. Assisted pt back to supine with all neds in reach and daugther at bedside. Pt on RA throughout session with SpO2 99-100% and HR 70s; intermittent coughing noted. Recommend HHPT with 24/7 caregiver support. Pt currently with functional limitations due to the deficits listed below (see PT Problem List). Pt will benefit from acute skilled PT to increase their independence and safety with mobility to allow discharge.           If plan is discharge home, recommend the following: A lot of help with walking and/or transfers;A lot of help with bathing/dressing/bathroom;Assistance with cooking/housework;Assist for transportation;Help with stairs or ramp for entrance   Can travel by private vehicle        Equipment Recommendations None recommended by PT  Recommendations for Other Services       Functional Status Assessment Patient has had a recent decline in their functional status and demonstrates the ability to make significant improvements in function in a reasonable and predictable amount of time.     Precautions /  Restrictions Precautions Precautions: Fall Recall of Precautions/Restrictions: Intact Restrictions Weight Bearing Restrictions Per Provider Order: No      Mobility  Bed Mobility Overal bed mobility: Needs Assistance Bed Mobility: Sit to Supine       Sit to supine: Min assist   General bed mobility comments: min A to lift BLE back into bed and reposition to comfort    Transfers Overall transfer level: Needs assistance Equipment used: Rolling walker (2 wheels) Transfers: Sit to/from Stand, Bed to chair/wheelchair/BSC Sit to Stand: Mod assist   Step pivot transfers: Min assist       General transfer comment: mod A to power to stand from recliner x2 reps, min A for step pivot to bedside with RW    Ambulation/Gait               General Gait Details: pt engages in static marching x20 reps with UE support on RW, able to clear feet for 2-3 steps over to recliner with RW and min A, unable to take steps away from bedside  Stairs            Wheelchair Mobility     Tilt Bed    Modified Rankin (Stroke Patients Only)       Balance Overall balance assessment: Needs assistance Sitting-balance support: Feet supported Sitting balance-Leahy Scale: Fair     Standing balance support: Reliant on assistive device for balance, During functional activity, Bilateral upper extremity supported Standing balance-Leahy Scale: Poor  Pertinent Vitals/Pain Pain Assessment Pain Assessment: Faces Faces Pain Scale: Hurts a little bit Pain Location: R knee Pain Descriptors / Indicators: Sore Pain Intervention(s): Limited activity within patient's tolerance, Monitored during session, Repositioned    Home Living Family/patient expects to be discharged to:: Private residence Living Arrangements: Children Available Help at Discharge: Family;Available 24 hours/day;Personal care attendant Type of Home: House Home Access: Ramped  entrance       Home Layout: One level Home Equipment: Tub bench;Rolling Walker (2 wheels);Rollator (4 wheels);BSC/3in1;Grab bars - tub/shower;Transport chair;Other (comment) (lift chair) Additional Comments: pt lives with 2 daughters, 1 daugther lives across the street, aide comes MWFSat 2hrs/day - assisting pt out of bed, shower, dress and makes breakfast    Prior Function Prior Level of Function : Needs assist             Mobility Comments: daugther reports pt using rollator for short distance in home ambulation supv 24/7, using transport w/c for front entry ramp ADLs Comments: daugther and aide assisting pt with self care, family completes household chores     Extremity/Trunk Assessment   Upper Extremity Assessment Upper Extremity Assessment: Defer to OT evaluation    Lower Extremity Assessment Lower Extremity Assessment: Generalized weakness    Cervical / Trunk Assessment Cervical / Trunk Assessment: Kyphotic  Communication   Communication Communication: Impaired Factors Affecting Communication: Hearing impaired    Cognition Arousal: Alert Behavior During Therapy: WFL for tasks assessed/performed   PT - Cognitive impairments: No apparent impairments                         Following commands: Intact       Cueing Cueing Techniques: Verbal cues     General Comments General comments (skin integrity, edema, etc.): pt on RA with SpO2 99-100%, HR 70s    Exercises     Assessment/Plan    PT Assessment Patient needs continued PT services  PT Problem List Decreased strength;Decreased activity tolerance;Decreased balance;Decreased mobility;Cardiopulmonary status limiting activity;Pain       PT Treatment Interventions DME instruction;Gait training;Functional mobility training;Therapeutic activities;Therapeutic exercise;Balance training;Neuromuscular re-education;Cognitive remediation;Patient/family education;Wheelchair mobility training    PT Goals  (Current goals can be found in the Care Plan section)  Acute Rehab PT Goals Patient Stated Goal: daugther reports increase HH aide PT Goal Formulation: With patient Time For Goal Achievement: 03/28/24 Potential to Achieve Goals: Good    Frequency Min 3X/week     Co-evaluation               AM-PAC PT 6 Clicks Mobility  Outcome Measure Help needed turning from your back to your side while in a flat bed without using bedrails?: A Little Help needed moving from lying on your back to sitting on the side of a flat bed without using bedrails?: A Little Help needed moving to and from a bed to a chair (including a wheelchair)?: A Little Help needed standing up from a chair using your arms (e.g., wheelchair or bedside chair)?: A Lot Help needed to walk in hospital room?: A Lot Help needed climbing 3-5 steps with a railing? : Total 6 Click Score: 14    End of Session Equipment Utilized During Treatment: Gait belt Activity Tolerance: Patient tolerated treatment well Patient left: in bed;with call bell/phone within reach;with bed alarm set;with family/visitor present Nurse Communication: Mobility status PT Visit Diagnosis: Unsteadiness on feet (R26.81);Muscle weakness (generalized) (M62.81);Difficulty in walking, not elsewhere classified (R26.2)    Time: 8579-8545 PT  Time Calculation (min) (ACUTE ONLY): 34 min   Charges:   PT Evaluation $PT Eval Moderate Complexity: 1 Mod PT Treatments $Therapeutic Activity: 8-22 mins PT General Charges $$ ACUTE PT VISIT: 1 Visit         Tori Narissa Beaufort PT, DPT 03/14/24, 3:12 PM

## 2024-03-14 NOTE — Progress Notes (Signed)
 Initial Nutrition Assessment  DOCUMENTATION CODES:   Severe malnutrition in context of chronic illness  INTERVENTION:  -Liberalize diet to regular menu, regular texture, thin liquids -Add Ensure Plus High Protein TID -Add MVI w/min, Thiamine -Encouraged increased PO intake as able -Discussed importance of adequate kcal/pro intake for maintaining function, with healing  NUTRITION DIAGNOSIS:   Severe Malnutrition related to poor appetite, chronic illness as evidenced by meal completion < 50%, per patient/family report, energy intake < 75% for > or equal to 1 month, severe muscle depletion, moderate fat depletion.  GOAL:   Patient will meet greater than or equal to 90% of their needs  MONITOR:   PO intake, Weight trends, Supplement acceptance, Labs, Skin  REASON FOR ASSESSMENT:   Consult Assessment of nutrition requirement/status  ASSESSMENT:   Hx CVA, diastolic CHF, HTN, hypothyroidism, RBBB, RA, seizure, was brought into the ED due to confusion as well as fever.  Spoke to pt and daughter in room. Pt denies n/v/c/d or chewing/swallowing difficulties. Pt's daughter, however, does endorse some chewing difficulties with dentures, family will chop up food at home. Last BM 8/1. No documented meal intake. Pt's daughter states she does not eat much at baseline, no appetite and that she tells her daughter she only eats because she knows she needs to. Documented weight stable x 6 months. Pt does utilize Boost at home. Discussed increasing ONS to BID; focusing on high kcal/pro ONS instead of just high protein option she currently consumes. Discussed fortified foods, provided handout. Discussed importance of adequate kcal/pro intake, particularly with muscle wasting, fat losses. NFPE complete (see below), pt meets criteria for [facility]. Discussed losses with age, decreased mobility, as well as decreased PO intake. Pt and daughter deny additional questions/concerns at this time, will continue to  monitor, RDN available prn.   Labs Na 132 Potassium 3.0 CO2 21 BG 160 Calcium  8.0 Albumin 2.4 H/H 10.5/33.3  Medications  aspirin   81 mg Oral Daily   atorvastatin   20 mg Oral QPM   carvedilol   6.25 mg Oral BID   divalproex   250 mg Oral Q12H   docusate sodium   100 mg Oral BID   famotidine   20 mg Oral Daily   feeding supplement  237 mL Oral TID BM   folic acid   1 mg Oral Daily   guaiFENesin  600 mg Oral BID   heparin  injection (subcutaneous)  5,000 Units Subcutaneous Q8H   levothyroxine   75 mcg Oral Q0600   multivitamin with minerals  1 tablet Oral Daily   primidone   50 mg Oral Daily   thiamine  100 mg Oral Daily     NUTRITION - FOCUSED PHYSICAL EXAM:  Flowsheet Row Most Recent Value  Orbital Region Moderate depletion  Upper Arm Region Moderate depletion  Thoracic and Lumbar Region Moderate depletion  Buccal Region Moderate depletion  Temple Region Severe depletion  Clavicle Bone Region Moderate depletion  Clavicle and Acromion Bone Region Moderate depletion  Scapular Bone Region Severe depletion  Dorsal Hand Moderate depletion  Patellar Region Severe depletion  Anterior Thigh Region Severe depletion  Posterior Calf Region Severe depletion  Edema (RD Assessment) Mild  Hair Reviewed  Eyes Reviewed  Mouth Reviewed  Skin Reviewed  Nails Reviewed    Diet Order:   Diet Order             Diet Heart Room service appropriate? Yes; Fluid consistency: Thin  Diet effective now  EDUCATION NEEDS:   Education needs have been addressed  Skin:  Skin Assessment: Reviewed RN Assessment  Last BM:  8/1  Height:   Ht Readings from Last 1 Encounters:  03/14/24 5' (1.524 m)    Weight:   Wt Readings from Last 1 Encounters:  03/13/24 49.9 kg    BMI:  Body mass index is 21.48 kg/m.  Estimated Nutritional Needs:   Kcal:  1200-1500 kcal  Protein:  50-60 g  Fluid:  1500 mL    Isauro Skelley Daml-Budig, RDN, LDN Registered Dietitian  Nutritionist RD Inpatient Contact Info in [place]

## 2024-03-14 NOTE — Plan of Care (Incomplete)
  Problem: Education: Goal: Knowledge of risk factors and measures for prevention of condition will improve Outcome: Progressing   Problem: Respiratory: Goal: Complications related to the disease process, condition or treatment will be avoided or minimized Outcome: Progressing   Problem: Education: Goal: Knowledge of General Education information will improve Description: Including pain rating scale, medication(s)/side effects and non-pharmacologic comfort measures Outcome: Progressing   Problem: Health Behavior/Discharge Planning: Goal: Ability to manage health-related needs will improve Outcome: Progressing   Problem: Clinical Measurements: Goal: Ability to maintain clinical measurements within normal limits will improve Outcome: Progressing Goal: Will remain free from infection Outcome: Progressing Goal: Diagnostic test results will improve Outcome: Progressing   Problem: Activity: Goal: Risk for activity intolerance will decrease Outcome: Progressing   Problem: Nutrition: Goal: Adequate nutrition will be maintained Outcome: Progressing   Problem: Elimination: Goal: Will not experience complications related to bowel motility Outcome: Progressing Goal: Will not experience complications related to urinary retention Outcome: Progressing   Problem: Safety: Goal: Ability to remain free from injury will improve Outcome: Progressing   Problem: Skin Integrity: Goal: Risk for impaired skin integrity will decrease Outcome: Progressing   Problem: Respiratory: Goal: Will maintain a patent airway Outcome: Adequate for Discharge   Problem: Clinical Measurements: Goal: Respiratory complications will improve Outcome: Adequate for Discharge Goal: Cardiovascular complication will be avoided Outcome: Adequate for Discharge   Problem: Coping: Goal: Level of anxiety will decrease Outcome: Adequate for Discharge   Problem: Pain Managment: Goal: General experience of comfort  will improve and/or be controlled Outcome: Adequate for Discharge

## 2024-03-15 DIAGNOSIS — E43 Unspecified severe protein-calorie malnutrition: Secondary | ICD-10-CM | POA: Insufficient documentation

## 2024-03-15 DIAGNOSIS — G9340 Encephalopathy, unspecified: Secondary | ICD-10-CM | POA: Diagnosis not present

## 2024-03-15 LAB — CBC WITH DIFFERENTIAL/PLATELET
Abs Immature Granulocytes: 0.05 K/uL (ref 0.00–0.07)
Basophils Absolute: 0 K/uL (ref 0.0–0.1)
Basophils Relative: 0 %
Eosinophils Absolute: 0.1 K/uL (ref 0.0–0.5)
Eosinophils Relative: 1 %
HCT: 32.8 % — ABNORMAL LOW (ref 36.0–46.0)
Hemoglobin: 10.9 g/dL — ABNORMAL LOW (ref 12.0–15.0)
Immature Granulocytes: 1 %
Lymphocytes Relative: 36 %
Lymphs Abs: 3.4 K/uL (ref 0.7–4.0)
MCH: 32.8 pg (ref 26.0–34.0)
MCHC: 33.2 g/dL (ref 30.0–36.0)
MCV: 98.8 fL (ref 80.0–100.0)
Monocytes Absolute: 1.4 K/uL — ABNORMAL HIGH (ref 0.1–1.0)
Monocytes Relative: 14 %
Neutro Abs: 4.5 K/uL (ref 1.7–7.7)
Neutrophils Relative %: 48 %
Platelets: 163 K/uL (ref 150–400)
RBC: 3.32 MIL/uL — ABNORMAL LOW (ref 3.87–5.11)
RDW: 15.2 % (ref 11.5–15.5)
WBC: 9.4 K/uL (ref 4.0–10.5)
nRBC: 0 % (ref 0.0–0.2)

## 2024-03-15 LAB — BASIC METABOLIC PANEL WITH GFR
Anion gap: 7 (ref 5–15)
BUN: 10 mg/dL (ref 8–23)
CO2: 24 mmol/L (ref 22–32)
Calcium: 7.7 mg/dL — ABNORMAL LOW (ref 8.9–10.3)
Chloride: 107 mmol/L (ref 98–111)
Creatinine, Ser: 0.56 mg/dL (ref 0.44–1.00)
GFR, Estimated: >60 mL/min (ref >60)
Glucose, Bld: 91 mg/dL (ref 70–99)
Potassium: 3.6 mmol/L (ref 3.5–5.1)
Sodium: 138 mmol/L (ref 135–145)

## 2024-03-15 LAB — URINE CULTURE: Culture: >=100000 — AB

## 2024-03-15 LAB — GLUCOSE, CAPILLARY
Glucose-Capillary: 117 mg/dL — ABNORMAL HIGH (ref 70–99)
Glucose-Capillary: 91 mg/dL (ref 70–99)

## 2024-03-15 LAB — MAGNESIUM: Magnesium: 1.8 mg/dL (ref 1.7–2.4)

## 2024-03-15 MED ORDER — POLYVINYL ALCOHOL 1.4 % OP SOLN
1.0000 [drp] | OPHTHALMIC | Status: DC | PRN
  Filled 2024-03-15: qty 15

## 2024-03-15 MED ORDER — LORAZEPAM 2 MG/ML IJ SOLN: 2.0000 mg | INTRAMUSCULAR | Status: AC | PRN

## 2024-03-15 NOTE — Plan of Care (Signed)
  Problem: Education: Goal: Knowledge of risk factors and measures for prevention of condition will improve Outcome: Progressing   Problem: Coping: Goal: Psychosocial and spiritual needs will be supported Outcome: Progressing   Problem: Respiratory: Goal: Will maintain a patent airway Outcome: Progressing Goal: Complications related to the disease process, condition or treatment will be avoided or minimized Outcome: Progressing   Problem: Education: Goal: Knowledge of General Education information will improve Description: Including pain rating scale, medication(s)/side effects and non-pharmacologic comfort measures Outcome: Progressing   Problem: Health Behavior/Discharge Planning: Goal: Ability to manage health-related needs will improve Outcome: Progressing   Problem: Clinical Measurements: Goal: Ability to maintain clinical measurements within normal limits will improve Outcome: Progressing Goal: Will remain free from infection Outcome: Progressing Goal: Diagnostic test results will improve Outcome: Progressing Goal: Respiratory complications will improve Outcome: Progressing Goal: Cardiovascular complication will be avoided Outcome: Progressing   Problem: Activity: Goal: Risk for activity intolerance will decrease Outcome: Progressing   Problem: Elimination: Goal: Will not experience complications related to bowel motility Outcome: Progressing Goal: Will not experience complications related to urinary retention Outcome: Progressing   Problem: Pain Managment: Goal: General experience of comfort will improve and/or be controlled Outcome: Progressing   Problem: Skin Integrity: Goal: Risk for impaired skin integrity will decrease Outcome: Progressing

## 2024-03-15 NOTE — Progress Notes (Signed)
 PROGRESS NOTE    Anna Mccoy  FMW:[id number] DOB: 10/11/1924 DOA: 03/13/2024 PCP: Lazoff, Shawn P, DO   Brief Narrative:  Anna Mccoy is a 89 y.o. female with medical history significant of CVA, diastolic CHF, HTN, hypothyroidism, RBBB, RA, seizure, was brought into the ED due to confusion as well as fever.  At baseline, patient is alert and oriented x 4.  Upon arrival to ED, she was febrile but blood pressure normal.  She was tested positive for COVID-19 pneumonia.  Chest x-ray unremarkable and CT head unremarkable.  Patient was presumed to be having UTI, started on cefepime , admitted to hospital service.  Assessment & Plan:   Principal Problem:   Acute encephalopathy Active Problems:   Hypothyroidism   Seizure cerebral (HCC)   Chronic diastolic heart failure (HCC)   History of stroke   COVID-19 virus infection   Urinary retention   Severe sepsis (HCC)   COVID-19   Protein-calorie malnutrition, severe  Severe sepsis secondary to COVID-19 pneumonia, POA: Patient met criteria for severe sepsis based on fever, tachypnea and lactic acid of> 2.  Patient received some IV fluids in the ED, lactic acid initially normalized from 2.7 but then got worse to 3.0 and then normalized after some more fluids.  Patient had another spike of fever but only 100.4 this morning.  Patient remains asymptomatic.  Chest x-ray normal and patient is not hypoxic, no indication of antivirals or steroids.  Treat symptomatically.  Bronchodilators, patient was encouraged to prone, out of bed to chair, to use incentive spirometry and flutter valve.  Acute septic/infectious encephalopathy: Patient is now fully alert and oriented, verified by the daughter at the bedside today and yesterday that she is at baseline.  Asymptomatic bacteriuria/colonization: Patient's UA is not impressive and with patient having no urinary complaints and indwelling Foley catheter, this is not true UTI instead this is asymptomatic  bacteriuria all colonization and does not require treatment, will not recommend antibiotics.  I doubt about the authenticity of the temperature this morning as well.  If there will be any other evidence of fever, we will consider treating as UTI.  Hypokalemia: Resolved.  Acute hyponatremia: Mild and patient asymptomatic.  SIADH ruled out.  Hyponatremia resolved.  Essential hypertension: Blood pressure on the low side, hold amlodipine  and olmesartan , continue Coreg .  Acquired hypothyroidism: Continue Synthroid .  Hyperlipidemia: Continue Lipitor.  DVT prophylaxis: heparin  injection 5,000 Units Start: 03/14/24 0830 SCDs Start: 03/14/24 0123   Code Status: Full Code  Family Communication: Daughter present at bedside.  Plan of care discussed with patient in length and he/she verbalized understanding and agreed with it.  Status is: Inpatient Remains inpatient appropriate because: Febrile this morning     Estimated body mass index is 21.48 kg/m as calculated from the following:   Height as of this encounter: 5' (1.524 m).   Weight as of this encounter: 49.9 kg.    Nutritional Assessment: Body mass index is 21.48 kg/m.SABRA Seen by dietician.  I agree with the assessment and plan as outlined below: Nutrition Status: Nutrition Problem: Severe Malnutrition Etiology: poor appetite, chronic illness Signs/Symptoms: meal completion < 50%, per patient/family report, energy intake < 75% for > or equal to 1 month, severe muscle depletion, moderate fat depletion Interventions: Refer to RD note for recommendations  . Skin Assessment: I have examined the patient's skin and I agree with the wound assessment as performed by the wound care RN as outlined below:    Consultants:  None  Procedures:  None  Antimicrobials:  Anti-infectives (From admission, onward)    Start     Dose/Rate Route Frequency Ordered Stop   03/13/24 2130  ceFEPIme  (MAXIPIME ) 2 g in sodium chloride  0.9 % 100 mL IVPB         2 g 200 mL/hr over 30 Minutes Intravenous  Once 03/13/24 2104 03/13/24 2227         Subjective: Patient seen and examined, daughter at the bedside.  Patient remains fully alert and oriented and she has no complaints at all.  Objective: Vitals:   03/14/24 2338 03/15/24 0326 03/15/24 0538 03/15/24 0853  BP: (!) 174/86 135/69 (!) 150/79 (!) 116/59  Pulse: 92  86 68  Resp:   (!) 21 20  Temp:   (!) 100.4 F (38 C) 98 F (36.7 C)  TempSrc:    Oral  SpO2:   97% 98%  Weight:      Height:        Intake/Output Summary (Last 24 hours) at 03/15/2024 1143 Last data filed at 03/15/2024 0600 Gross per 24 hour  Intake 3456.04 ml  Output 3875 ml  Net -418.96 ml   Filed Weights   03/13/24 2027  Weight: 49.9 kg    Examination:  General exam: Appears calm and comfortable  Respiratory system: Clear to auscultation. Respiratory effort normal. Cardiovascular system: S1 & S2 heard, RRR. No JVD, murmurs, rubs, gallops or clicks. No pedal edema. Gastrointestinal system: Abdomen is nondistended, soft and nontender. No organomegaly or masses felt. Normal bowel sounds heard. Central nervous system: Alert and oriented. No focal neurological deficits. Extremities: Symmetric 5 x 5 power. Skin: No rashes, lesions or ulcers.  Psychiatry: Judgement and insight appear normal. Mood & affect appropriate.   Data Reviewed: I have personally reviewed following labs and imaging studies  CBC: Recent Labs  Lab 03/13/24 2048 03/14/24 0458 03/15/24 0824  WBC 10.0 7.4 9.4  NEUTROABS 5.1  --  4.5  HGB 11.6* 10.5* 10.9*  HCT 34.9* 33.3* 32.8*  MCV 98.3 102.1* 98.8  PLT 188 156 163   Basic Metabolic Panel: Recent Labs  Lab 03/13/24 2048 03/14/24 0458 03/15/24 0824  NA 133* 132* 138  K 3.8 3.0* 3.6  CL 100 101 107  CO2 24 21* 24  GLUCOSE 100* 160* 91  BUN 17 15 10   CREATININE 0.95 0.78 0.56  CALCIUM  8.8* 8.0* 7.7*  MG 2.0 1.8 1.8  PHOS 2.4* 3.5  --    GFR: Estimated Creatinine  Clearance: 29.5 mL/min (by C-G formula based on SCr of 0.56 mg/dL). Liver Function Tests: Recent Labs  Lab 03/13/24 2048 03/14/24 0458  AST 38 31  ALT 24 19  ALKPHOS 48 40  BILITOT 0.4 0.4  PROT 6.2* 5.3*  ALBUMIN 2.8* 2.4*   No results for input(s): LIPASE, AMYLASE in the last 168 hours. No results for input(s): AMMONIA in the last 168 hours. Coagulation Profile: Recent Labs  Lab 03/13/24 2048  INR 1.1   Cardiac Enzymes: Recent Labs  Lab 03/13/24 2048  CKTOTAL 74   BNP (last 3 results) No results for input(s): PROBNP in the last 8760 hours. HbA1C: No results for input(s): HGBA1C in the last 72 hours. CBG: Recent Labs  Lab 03/15/24 1126  GLUCAP 117*   Lipid Profile: No results for input(s): CHOL, HDL, LDLCALC, TRIG, CHOLHDL, LDLDIRECT in the last 72 hours. Thyroid  Function Tests: No results for input(s): TSH, T4TOTAL, FREET4, T3FREE, THYROIDAB in the last 72 hours. Anemia Panel: No results for input(s): VITAMINB12, FOLATE,  FERRITIN, TIBC, IRON , RETICCTPCT in the last 72 hours. Sepsis Labs: Recent Labs  Lab 03/13/24 2048 03/13/24 2059 03/14/24 0115 03/14/24 0458 03/14/24 0959 03/14/24 1804  PROCALCITON 0.23  --   --   --   --   --   LATICACIDVEN  --    < > 1.6 3.0* 2.4* 1.7   < > = values in this interval not displayed.    Recent Results (from the past 240 hours)  Culture, blood (Routine x 2)     Status: None (Preliminary result)   Collection Time: 03/13/24  8:40 PM   Specimen: BLOOD RIGHT HAND  Result Value Ref Range Status   Specimen Description   Final    BLOOD RIGHT HAND Performed at [facility], [address], [place], [place] [address]    Special Requests   Final    BOTTLES DRAWN AEROBIC AND ANAEROBIC Blood Culture results may not be optimal due to an inadequate volume of blood received in culture bottles Performed at [facility], [address],  [place], [place] [address]    Culture   Final    NO GROWTH 2 DAYS Performed at [facility], [address], [place], [place] [address]    Report Status PENDING  Incomplete  Culture, blood (Routine x 2)     Status: None (Preliminary result)   Collection Time: 03/13/24  8:48 PM   Specimen: BLOOD LEFT FOREARM  Result Value Ref Range Status   Specimen Description   Final    BLOOD LEFT FOREARM Performed at [facility], [address] [place], [place] [address]    Special Requests   Final    BOTTLES DRAWN AEROBIC AND ANAEROBIC Blood Culture results may not be optimal due to an inadequate volume of blood received in culture bottles Performed at [facility], [address], [place], [place] [address]    Culture   Final    NO GROWTH 2 DAYS Performed at [facility], [address] [place], [place] [address]    Report Status PENDING  Incomplete  Resp panel by RT-PCR (RSV, Flu A&B, Covid) Anterior Nasal Swab     Status: Abnormal   Collection Time: 03/13/24  9:02 PM   Specimen: Anterior Nasal Swab  Result Value Ref Range Status   SARS Coronavirus 2 by RT PCR POSITIVE (A) NEGATIVE Final    Comment: (NOTE) SARS-CoV-2 target nucleic acids are DETECTED.  The SARS-CoV-2 RNA is generally detectable in upper respiratory specimens during the acute phase of infection. Positive results are indicative of the presence of the identified virus, but do not rule out bacterial infection or co-infection with other pathogens not detected by the test. Clinical correlation with patient history and other diagnostic information is necessary to determine patient infection status. The expected result is Negative.  Fact Sheet for Patients: BloggerCourse.com  Fact Sheet for Healthcare Providers: SeriousBroker.it  This test is not yet approved or cleared by the [place]  FDA and  has been authorized for detection and/or  diagnosis of SARS-CoV-2 by FDA under an Emergency Use Authorization (EUA).  This EUA will remain in effect (meaning this test can be used) for the duration of  the COVID-19 declaration under Section 564(b)(1) of the A ct, 21 U.S.C. section 360bbb-3(b)(1), unless the authorization is terminated or revoked sooner.     Influenza A by PCR NEGATIVE NEGATIVE Final   Influenza B by PCR NEGATIVE NEGATIVE Final  Comment: (NOTE) The Xpert Xpress SARS-CoV-2/FLU/RSV plus assay is intended as an aid in the diagnosis of influenza from Nasopharyngeal swab specimens and should not be used as a sole basis for treatment. Nasal washings and aspirates are unacceptable for Xpert Xpress SARS-CoV-2/FLU/RSV testing.  Fact Sheet for Patients: BloggerCourse.com  Fact Sheet for Healthcare Providers: SeriousBroker.it  This test is not yet approved or cleared by the [place]  FDA and has been authorized for detection and/or diagnosis of SARS-CoV-2 by FDA under an Emergency Use Authorization (EUA). This EUA will remain in effect (meaning this test can be used) for the duration of the COVID-19 declaration under Section 564(b)(1) of the Act, 21 U.S.C. section 360bbb-3(b)(1), unless the authorization is terminated or revoked.     Resp Syncytial Virus by PCR NEGATIVE NEGATIVE Final    Comment: (NOTE) Fact Sheet for Patients: BloggerCourse.com  Fact Sheet for Healthcare Providers: SeriousBroker.it  This test is not yet approved or cleared by the [place]  FDA and has been authorized for detection and/or diagnosis of SARS-CoV-2 by FDA under an Emergency Use Authorization (EUA). This EUA will remain in effect (meaning this test can be used) for the duration of the COVID-19 declaration under Section 564(b)(1) of the Act, 21 U.S.C. section 360bbb-3(b)(1), unless the authorization is terminated  or revoked.  Performed at [facility], [address] [place], [place] [address]   Urine Culture     Status: Abnormal   Collection Time: 03/13/24  9:45 PM   Specimen: Urine, Random  Result Value Ref Range Status   Specimen Description   Final    URINE, RANDOM Performed at [facility], [address], [place], [place] [address]    Special Requests   Final    NONE Reflexed from [id number] Performed at [facility], [address] [place], [place] [address]    Culture >=100,000 COLONIES/mL KLEBSIELLA PNEUMONIAE (A)  Final   Report Status 03/15/2024 FINAL  Final   Organism ID, Bacteria KLEBSIELLA PNEUMONIAE (A)  Final      Susceptibility   Klebsiella pneumoniae - MIC*    AMPICILLIN >=32 RESISTANT Resistant     CEFAZOLIN <=4 SENSITIVE Sensitive     CEFEPIME  <=0.12 SENSITIVE Sensitive     CEFTRIAXONE  <=0.25 SENSITIVE Sensitive     CIPROFLOXACIN <=0.25 SENSITIVE Sensitive     GENTAMICIN <=1 SENSITIVE Sensitive     IMIPENEM <=0.25 SENSITIVE Sensitive     NITROFURANTOIN  32 SENSITIVE Sensitive     TRIMETH/SULFA <=20 SENSITIVE Sensitive     AMPICILLIN/SULBACTAM 4 SENSITIVE Sensitive     PIP/TAZO 8 SENSITIVE Sensitive ug/mL    * >=100,000 COLONIES/mL KLEBSIELLA PNEUMONIAE     Radiology Studies: CT Head Wo Contrast Result Date: 03/14/2024 CLINICAL DATA:  Headaches and fever EXAM: CT HEAD WITHOUT CONTRAST TECHNIQUE: Contiguous axial images were obtained from the base of the skull through the vertex without intravenous contrast. RADIATION DOSE REDUCTION: This exam was performed according to the departmental dose-optimization program which includes automated exposure control, adjustment of the mA and/or kV according to patient size and/or use of iterative reconstruction technique. COMPARISON:  02/17/2022 FINDINGS: Brain: No evidence of acute infarction, hemorrhage, hydrocephalus, extra-axial collection or mass lesion/mass effect. Chronic  atrophic and ischemic changes are noted. Vascular: No hyperdense vessel or unexpected calcification. Skull: Normal. Negative for fracture or focal lesion. Sinuses/Orbits: No acute finding. Other: None. IMPRESSION: Chronic atrophic and ischemic changes without acute abnormality. Electronically Signed   By: Oneil Devonshire M.D.   On:  03/14/2024 00:11   DG Chest Port 1 View if patient is in a treatment room. Result Date: 03/13/2024 CLINICAL DATA:  Fevers and possible sepsis, initial encounter EXAM: PORTABLE CHEST 1 VIEW COMPARISON:  02/22/2022 FINDINGS: The heart size and mediastinal contours are within normal limits. Both lungs are clear. The visualized skeletal structures are unremarkable. IMPRESSION: No active disease. Electronically Signed   By: Oneil Devonshire M.D.   On: 03/13/2024 21:32    Scheduled Meds:  aspirin   81 mg Oral Daily   atorvastatin   20 mg Oral QPM   carvedilol   6.25 mg Oral BID   Chlorhexidine  Gluconate Cloth  6 each Topical Daily   divalproex   250 mg Oral Q12H   docusate sodium   100 mg Oral BID   famotidine   20 mg Oral Daily   feeding supplement  237 mL Oral TID BM   folic acid   1 mg Oral Daily   guaiFENesin   600 mg Oral BID   heparin  injection (subcutaneous)  5,000 Units Subcutaneous Q8H   levothyroxine   75 mcg Oral Q0600   multivitamin with minerals  1 tablet Oral Daily   primidone   50 mg Oral Daily   thiamine   100 mg Oral Daily   Continuous Infusions:     LOS: 1 day   Fredia Skeeter, MD [facility]  03/15/2024, 11:43 AM   *Please note that this is a verbal dictation therefore any spelling or grammatical errors are due to the Dragon Medical One system interpretation.  Please page via Amion and do not message via secure chat for urgent patient care matters. Secure chat can be used for non urgent patient care matters.  How to contact the [facility] Attending or Consulting provider 7A - 7P or covering provider during after hours 7P -7A, for this patient?  Check the  care team in [facility] and look for a) attending/consulting [facility] provider listed and b) the [facility] team listed. Page or secure chat 7A-7P. Log into [email or web address] and use [facility] universal password to access. If you do not have the password, please contact the hospital operator. Locate the [facility] provider you are looking for under [facility] and page to a number that you can be directly reached. If you still have difficulty reaching the provider, please page the [facility] (Director on Call) for the Hospitalists listed on amion for assistance.

## 2024-03-16 DIAGNOSIS — G9340 Encephalopathy, unspecified: Secondary | ICD-10-CM | POA: Diagnosis not present

## 2024-03-16 LAB — GLUCOSE, CAPILLARY
Glucose-Capillary: 97 mg/dL (ref 70–99)
Glucose-Capillary: 133 mg/dL — ABNORMAL HIGH (ref 70–99)

## 2024-03-16 NOTE — Plan of Care (Signed)
  Problem: Coping: Goal: Psychosocial and spiritual needs will be supported Outcome: Progressing   Problem: Respiratory: Goal: Will maintain a patent airway Outcome: Progressing Goal: Complications related to the disease process, condition or treatment will be avoided or minimized Outcome: Progressing   Problem: Clinical Measurements: Goal: Ability to maintain clinical measurements within normal limits will improve Outcome: Progressing

## 2024-03-16 NOTE — Discharge Summary (Signed)
 Physician Discharge Summary  Anna Mccoy FMW:[id number] DOB: 1925/01/20 DOA: 03/13/2024  PCP: Lazoff, Shawn P, DO  Admit date: 03/13/2024 Discharge date: 03/16/2024 30 Day Unplanned Readmission Risk Score    Flowsheet Row ED to Hosp-Admission (Current) from 03/13/2024 in [facility]TH FLOOR PROGRESSIVE CARE AND UROLOGY  30 Day Unplanned Readmission Risk Score (%) 20.46 Filed at 03/16/2024 0801    This score is the patient's risk of an unplanned readmission within 30 days of being discharged (0 -100%). The score is based on dignosis, age, lab data, medications, orders, and past utilization.   Low:  0-14.9   Medium: 15-21.9   High: 22-29.9   Extreme: 30 and above          Admitted From: Home Disposition: Home  Recommendations for Outpatient Follow-up:  Follow up with PCP in 1-2 weeks Please obtain BMP/CBC in one week Please follow up with your PCP on the following pending results: Unresulted Labs (From admission, onward)    None         Home Health: Yes Equipment/Devices: None  Discharge Condition: Stable CODE STATUS: Full code Diet recommendation: Cardiac  Subjective: Seen and examined, daughter at bedside.  Patient feels well, alert and oriented denies any shortness of breath or other complaint.  Brief/Interim Summary: Anna Mccoy is a 89 y.o. female with medical history significant of CVA, diastolic CHF, HTN, hypothyroidism, RBBB, RA, seizure, was brought into the ED due to confusion as well as fever.  At baseline, patient is alert and oriented x 4.  Upon arrival to ED, she was febrile but blood pressure normal.  She was tested positive for COVID-19 pneumonia.  Chest x-ray unremarkable and CT head unremarkable.  Patient was presumed to be having UTI, started on cefepime , admitted to hospitalist service.  Severe sepsis secondary to COVID-19 pneumonia, POA: Patient met criteria for severe sepsis based on fever, tachypnea and lactic acid of> 2.  Patient received  some IV fluids in the ED, lactic acid initially normalized from 2.7 but then got worse to 3.0 and then normalized after some more fluids.  Patient had another spike of fever but only 100.4 on the morning of 03/15/2024, has remained afebrile since then.  Patient remains asymptomatic.  Chest x-ray normal and patient is not hypoxic, no indication of antivirals or steroids.  Treated symptomatically.  Stable for discharge.   Acute septic/infectious encephalopathy: Patient is now fully alert and oriented, verified by the daughter at the bedside today and yesterday that she is at baseline.   Asymptomatic bacteriuria/colonization: Patient's UA is not impressive and with patient having no urinary complaints and indwelling Foley catheter, this is not true UTI instead this is asymptomatic bacteriuria all colonization and does not require treatment, will not recommend antibiotics.    Hypokalemia: Resolved.   Acute hyponatremia: Mild and patient asymptomatic.  SIADH ruled out.  Hyponatremia resolved.   Essential hypertension: Blood pressure was on the low side, antihypertensives held, now blood pressure improving, resume all home medications.   Acquired hypothyroidism: Continue Synthroid .   Hyperlipidemia: Continue Lipitor.  Discharge plan was discussed with patient and/or family member and they verbalized understanding and agreed with it.  Discharge Diagnoses:  Principal Problem:   Acute encephalopathy Active Problems:   Hypothyroidism   Seizure cerebral (HCC)   Chronic diastolic heart failure (HCC)   History of stroke   COVID-19 virus infection   Urinary retention   Severe sepsis (HCC)   COVID-19   Protein-calorie malnutrition, severe  Discharge Instructions   Allergies as of 03/16/2024       Reactions   Ace Inhibitors Cough   Bacitracin-polymyxin B    Other reaction(s): Unknown   Ciprofloxacin Diarrhea   Lidocaine Other (See Comments)   unknown   Neosporin [neomycin-bacitracin  Zn-polymyx] Other (See Comments)   unknown   Sulfa Antibiotics Other (See Comments)   unknown        Medication List     TAKE these medications    acetaminophen  500 MG tablet Commonly known as: TYLENOL  Take 500 mg by mouth at bedtime.   amLODipine  5 MG tablet Commonly known as: NORVASC  Take 5 mg by mouth at bedtime.   aspirin  81 MG tablet Take 81 mg by mouth daily.   atorvastatin  20 MG tablet Commonly known as: LIPITOR Take 20 mg by mouth every evening.   carvedilol  6.25 MG tablet Commonly known as: COREG  Take 6.25 mg by mouth 2 (two) times daily.   COLACE PO Take 2 tablets by mouth at bedtime.   divalproex  250 MG DR tablet Commonly known as: DEPAKOTE  Take 1 tablet (250 mg total) by mouth every 12 (twelve) hours.   famotidine  20 MG tablet Commonly known as: PEPCID  Take 20 mg by mouth daily.   fluticasone 50 MCG/ACT nasal spray Commonly known as: FLONASE Place 1 spray into the nose.   folic acid  1 MG tablet Commonly known as: FOLVITE  Take 1 mg by mouth daily.   furosemide  20 MG tablet Commonly known as: LASIX  Take 20 mg by mouth every Monday, Wednesday, and Friday.   hypromellose 0.3 % Gel ophthalmic ointment Commonly known as: GENTEAL Place 1 application  into both eyes See admin instructions. Gel - Every day except the days using Tobradex   levothyroxine  75 MCG tablet Commonly known as: SYNTHROID  Take 75 mcg by mouth daily before breakfast.   olmesartan  40 MG tablet Commonly known as: BENICAR  Take 40 mg by mouth daily.   ORENCIA  [place] Inject 1 Syringe into the skin every 30 (thirty) days.   oxybutynin  5 MG tablet Commonly known as: DITROPAN  Take 1 tablet (5 mg total) by mouth every 8 (eight) hours as needed for bladder spasms (and foley leakage).   Poly-Iron  150 150 MG capsule Generic drug: iron  polysaccharides Take 150 mg by mouth daily.   PRESERVISION AREDS PO Take 1 capsule by mouth 2 (two) times daily.   primidone  50 MG  tablet Commonly known as: MYSOLINE  Take 50 mg by mouth daily.   PROBIOTIC PO Take 1 capsule by mouth daily.   TobraDex ophthalmic ointment Generic drug: tobramycin-dexamethasone Place 1 Application into both eyes See admin instructions. Once every 3 days PRN   Vitamin D  50 MCG (2000 UT) tablet Take 2,000 Units by mouth daily.        Follow-up Information     Lazoff, Shawn P, DO Follow up in 1 week(s).   Specialty: Family Medicine Contact information: [address] [place] [address] [phone]                Allergies  Allergen Reactions   Ace Inhibitors Cough   Bacitracin-Polymyxin B     Other reaction(s): Unknown   Ciprofloxacin Diarrhea   Lidocaine Other (See Comments)    unknown   Neosporin [Neomycin-Bacitracin Zn-Polymyx] Other (See Comments)    unknown   Sulfa Antibiotics Other (See Comments)    unknown    Consultations: None   Procedures/Studies: CT Head Wo Contrast Result Date: 03/14/2024 CLINICAL DATA:  Headaches and fever EXAM: CT HEAD WITHOUT CONTRAST TECHNIQUE: Contiguous axial images were obtained from the base of the skull through the vertex without intravenous contrast. RADIATION DOSE REDUCTION: This exam was performed according to the departmental dose-optimization program which includes automated exposure control, adjustment of the mA and/or kV according to patient size and/or use of iterative reconstruction technique. COMPARISON:  02/17/2022 FINDINGS: Brain: No evidence of acute infarction, hemorrhage, hydrocephalus, extra-axial collection or mass lesion/mass effect. Chronic atrophic and ischemic changes are noted. Vascular: No hyperdense vessel or unexpected calcification. Skull: Normal. Negative for fracture or focal lesion. Sinuses/Orbits: No acute finding. Other: None. IMPRESSION: Chronic atrophic and ischemic changes without acute abnormality. Electronically Signed   By: Oneil Devonshire M.D.   On: 03/14/2024 00:11   DG Chest Port 1  View if patient is in a treatment room. Result Date: 03/13/2024 CLINICAL DATA:  Fevers and possible sepsis, initial encounter EXAM: PORTABLE CHEST 1 VIEW COMPARISON:  02/22/2022 FINDINGS: The heart size and mediastinal contours are within normal limits. Both lungs are clear. The visualized skeletal structures are unremarkable. IMPRESSION: No active disease. Electronically Signed   By: Oneil Devonshire M.D.   On: 03/13/2024 21:32     Discharge Exam: Vitals:   03/15/24 2011 03/16/24 0520  BP: (!) 144/81 (!) 168/84  Pulse: 73 82  Resp: 18 16  Temp: 99 F (37.2 C) 99 F (37.2 C)  SpO2: 100% 98%   Vitals:   03/15/24 1322 03/15/24 1744 03/15/24 2011 03/16/24 0520  BP: 108/60 (!) 146/73 (!) 144/81 (!) 168/84  Pulse: 66  73 82  Resp: 18 20 18 16   Temp: 97.8 F (36.6 C)  99 F (37.2 C) 99 F (37.2 C)  TempSrc: Oral  Oral Oral  SpO2: 100%  100% 98%  Weight:      Height:        General: Pt is alert, awake, not in acute distress Cardiovascular: RRR, S1/S2 +, no rubs, no gallops Respiratory: CTA bilaterally, no wheezing, no rhonchi Abdominal: Soft, NT, ND, bowel sounds + Extremities: no edema, no cyanosis    The results of significant diagnostics from this hospitalization (including imaging, microbiology, ancillary and laboratory) are listed below for reference.     Microbiology: Recent Results (from the past 240 hours)  Culture, blood (Routine x 2)     Status: None (Preliminary result)   Collection Time: 03/13/24  8:40 PM   Specimen: BLOOD RIGHT HAND  Result Value Ref Range Status   Specimen Description   Final    BLOOD RIGHT HAND Performed at [facility], [address], [place], [place] [address]    Special Requests   Final    BOTTLES DRAWN AEROBIC AND ANAEROBIC Blood Culture results may not be optimal due to an inadequate volume of blood received in culture bottles Performed at [facility], [address], [place], [place] [address]     Culture   Final    NO GROWTH 2 DAYS Performed at [facility], [address], [place], [place] [address]    Report Status PENDING  Incomplete  Culture, blood (Routine x 2)     Status: None (Preliminary result)   Collection Time: 03/13/24  8:48 PM   Specimen: BLOOD LEFT FOREARM  Result Value Ref Range Status   Specimen Description   Final    BLOOD LEFT FOREARM Performed at [facility], [address] [place], [place] [address]    Special Requests  Final    BOTTLES DRAWN AEROBIC AND ANAEROBIC Blood Culture results may not be optimal due to an inadequate volume of blood received in culture bottles Performed at [facility], [address], [place], [place] [address]    Culture   Final    NO GROWTH 2 DAYS Performed at [facility], [address], [place], [place] [address]    Report Status PENDING  Incomplete  Resp panel by RT-PCR (RSV, Flu A&B, Covid) Anterior Nasal Swab     Status: Abnormal   Collection Time: 03/13/24  9:02 PM   Specimen: Anterior Nasal Swab  Result Value Ref Range Status   SARS Coronavirus 2 by RT PCR POSITIVE (A) NEGATIVE Final    Comment: (NOTE) SARS-CoV-2 target nucleic acids are DETECTED.  The SARS-CoV-2 RNA is generally detectable in upper respiratory specimens during the acute phase of infection. Positive results are indicative of the presence of the identified virus, but do not rule out bacterial infection or co-infection with other pathogens not detected by the test. Clinical correlation with patient history and other diagnostic information is necessary to determine patient infection status. The expected result is Negative.  Fact Sheet for Patients: BloggerCourse.com  Fact Sheet for Healthcare Providers: SeriousBroker.it  This test is not yet approved or cleared by the [place]  FDA and  has been authorized for detection and/or diagnosis of SARS-CoV-2  by FDA under an Emergency Use Authorization (EUA).  This EUA will remain in effect (meaning this test can be used) for the duration of  the COVID-19 declaration under Section 564(b)(1) of the A ct, 21 U.S.C. section 360bbb-3(b)(1), unless the authorization is terminated or revoked sooner.     Influenza A by PCR NEGATIVE NEGATIVE Final   Influenza B by PCR NEGATIVE NEGATIVE Final    Comment: (NOTE) The Xpert Xpress SARS-CoV-2/FLU/RSV plus assay is intended as an aid in the diagnosis of influenza from Nasopharyngeal swab specimens and should not be used as a sole basis for treatment. Nasal washings and aspirates are unacceptable for Xpert Xpress SARS-CoV-2/FLU/RSV testing.  Fact Sheet for Patients: BloggerCourse.com  Fact Sheet for Healthcare Providers: SeriousBroker.it  This test is not yet approved or cleared by the [place]  FDA and has been authorized for detection and/or diagnosis of SARS-CoV-2 by FDA under an Emergency Use Authorization (EUA). This EUA will remain in effect (meaning this test can be used) for the duration of the COVID-19 declaration under Section 564(b)(1) of the Act, 21 U.S.C. section 360bbb-3(b)(1), unless the authorization is terminated or revoked.     Resp Syncytial Virus by PCR NEGATIVE NEGATIVE Final    Comment: (NOTE) Fact Sheet for Patients: BloggerCourse.com  Fact Sheet for Healthcare Providers: SeriousBroker.it  This test is not yet approved or cleared by the [place]  FDA and has been authorized for detection and/or diagnosis of SARS-CoV-2 by FDA under an Emergency Use Authorization (EUA). This EUA will remain in effect (meaning this test can be used) for the duration of the COVID-19 declaration under Section 564(b)(1) of the Act, 21 U.S.C. section 360bbb-3(b)(1), unless the authorization is terminated or revoked.  Performed at  [facility], [address], [place], [place] [address]   Urine Culture     Status: Abnormal   Collection Time: 03/13/24  9:45 PM   Specimen: Urine, Random  Result Value Ref Range Status   Specimen Description   Final    URINE, RANDOM Performed at [facility], [address]  [place], [place] [address]    Special Requests   Final    NONE Reflexed from [id number] Performed at [facility], [address] [place], [place] [address]    Culture >=100,000 COLONIES/mL KLEBSIELLA PNEUMONIAE (A)  Final   Report Status 03/15/2024 FINAL  Final   Organism ID, Bacteria KLEBSIELLA PNEUMONIAE (A)  Final      Susceptibility   Klebsiella pneumoniae - MIC*    AMPICILLIN >=32 RESISTANT Resistant     CEFAZOLIN <=4 SENSITIVE Sensitive     CEFEPIME  <=0.12 SENSITIVE Sensitive     CEFTRIAXONE  <=0.25 SENSITIVE Sensitive     CIPROFLOXACIN <=0.25 SENSITIVE Sensitive     GENTAMICIN <=1 SENSITIVE Sensitive     IMIPENEM <=0.25 SENSITIVE Sensitive     NITROFURANTOIN  32 SENSITIVE Sensitive     TRIMETH/SULFA <=20 SENSITIVE Sensitive     AMPICILLIN/SULBACTAM 4 SENSITIVE Sensitive     PIP/TAZO 8 SENSITIVE Sensitive ug/mL    * >=100,000 COLONIES/mL KLEBSIELLA PNEUMONIAE     Labs: BNP (last 3 results) No results for input(s): BNP in the last 8760 hours. Basic Metabolic Panel: Recent Labs  Lab 03/13/24 2048 03/14/24 0458 03/15/24 0824  NA 133* 132* 138  K 3.8 3.0* 3.6  CL 100 101 107  CO2 24 21* 24  GLUCOSE 100* 160* 91  BUN 17 15 10   CREATININE 0.95 0.78 0.56  CALCIUM  8.8* 8.0* 7.7*  MG 2.0 1.8 1.8  PHOS 2.4* 3.5  --    Liver Function Tests: Recent Labs  Lab 03/13/24 2048 03/14/24 0458  AST 38 31  ALT 24 19  ALKPHOS 48 40  BILITOT 0.4 0.4  PROT 6.2* 5.3*  ALBUMIN 2.8* 2.4*   No results for input(s): LIPASE, AMYLASE in the last 168 hours. No results for input(s): AMMONIA in the last 168 hours. CBC: Recent Labs  Lab  03/13/24 2048 03/14/24 0458 03/15/24 0824  WBC 10.0 7.4 9.4  NEUTROABS 5.1  --  4.5  HGB 11.6* 10.5* 10.9*  HCT 34.9* 33.3* 32.8*  MCV 98.3 102.1* 98.8  PLT 188 156 163   Cardiac Enzymes: Recent Labs  Lab 03/13/24 2048  CKTOTAL 74   BNP: Invalid input(s): POCBNP CBG: Recent Labs  Lab 03/15/24 1126 03/15/24 1727 03/16/24 0829  GLUCAP 117* 91 97   D-Dimer No results for input(s): DDIMER in the last 72 hours. Hgb A1c No results for input(s): HGBA1C in the last 72 hours. Lipid Profile No results for input(s): CHOL, HDL, LDLCALC, TRIG, CHOLHDL, LDLDIRECT in the last 72 hours. Thyroid  function studies No results for input(s): TSH, T4TOTAL, T3FREE, THYROIDAB in the last 72 hours.  Invalid input(s): FREET3 Anemia work up No results for input(s): VITAMINB12, FOLATE, FERRITIN, TIBC, IRON , RETICCTPCT in the last 72 hours. Urinalysis    Component Value Date/Time   COLORURINE YELLOW 03/13/2024 2145   APPEARANCEUR CLOUDY (A) 03/13/2024 2145   APPEARANCEUR Turbid (A) 09/07/2022 1634   LABSPEC 1.012 03/13/2024 2145   PHURINE 5.0 03/13/2024 2145   GLUCOSEU NEGATIVE 03/13/2024 2145   HGBUR NEGATIVE 03/13/2024 2145   BILIRUBINUR NEGATIVE 03/13/2024 2145   BILIRUBINUR Negative 09/07/2022 1634   KETONESUR NEGATIVE 03/13/2024 2145   PROTEINUR 30 (A) 03/13/2024 2145   UROBILINOGEN negative (A) 01/06/2020 1049   UROBILINOGEN 1.0 02/04/2015 0539   NITRITE NEGATIVE 03/13/2024 2145   LEUKOCYTESUR LARGE (A) 03/13/2024 2145   Sepsis Labs Recent Labs  Lab 03/13/24 2048 03/14/24 0458 03/15/24 0824  WBC 10.0 7.4 9.4   Microbiology Recent Results (from the past  240 hours)  Culture, blood (Routine x 2)     Status: None (Preliminary result)   Collection Time: 03/13/24  8:40 PM   Specimen: BLOOD RIGHT HAND  Result Value Ref Range Status   Specimen Description   Final    BLOOD RIGHT HAND Performed at [facility], [address], [place], [place] [address]    Special Requests   Final    BOTTLES DRAWN AEROBIC AND ANAEROBIC Blood Culture results may not be optimal due to an inadequate volume of blood received in culture bottles Performed at [facility], [address] [place], [place] [address]    Culture   Final    NO GROWTH 2 DAYS Performed at [facility], [address], [place], [place] [address]    Report Status PENDING  Incomplete  Culture, blood (Routine x 2)     Status: None (Preliminary result)   Collection Time: 03/13/24  8:48 PM   Specimen: BLOOD LEFT FOREARM  Result Value Ref Range Status   Specimen Description   Final    BLOOD LEFT FOREARM Performed at [facility], [address], [place], [place] [address]    Special Requests   Final    BOTTLES DRAWN AEROBIC AND ANAEROBIC Blood Culture results may not be optimal due to an inadequate volume of blood received in culture bottles Performed at [facility], [address], [place], [place] [address]    Culture   Final    NO GROWTH 2 DAYS Performed at [facility], [address] [place], [place] [address]    Report Status PENDING  Incomplete  Resp panel by RT-PCR (RSV, Flu A&B, Covid) Anterior Nasal Swab     Status: Abnormal   Collection Time: 03/13/24  9:02 PM   Specimen: Anterior Nasal Swab  Result Value Ref Range Status   SARS Coronavirus 2 by RT PCR POSITIVE (A) NEGATIVE Final    Comment: (NOTE) SARS-CoV-2 target nucleic acids are DETECTED.  The SARS-CoV-2 RNA is generally detectable in upper respiratory specimens during the acute phase of infection. Positive results are indicative of the presence of the identified virus, but do not rule out bacterial infection or co-infection with other pathogens not detected by the test. Clinical correlation with patient history and other diagnostic information is necessary to determine patient infection status. The expected result is  Negative.  Fact Sheet for Patients: BloggerCourse.com  Fact Sheet for Healthcare Providers: SeriousBroker.it  This test is not yet approved or cleared by the [place]  FDA and  has been authorized for detection and/or diagnosis of SARS-CoV-2 by FDA under an Emergency Use Authorization (EUA).  This EUA will remain in effect (meaning this test can be used) for the duration of  the COVID-19 declaration under Section 564(b)(1) of the A ct, 21 U.S.C. section 360bbb-3(b)(1), unless the authorization is terminated or revoked sooner.     Influenza A by PCR NEGATIVE NEGATIVE Final   Influenza B by PCR NEGATIVE NEGATIVE Final    Comment: (NOTE) The Xpert Xpress SARS-CoV-2/FLU/RSV plus assay is intended as an aid in the diagnosis of influenza from Nasopharyngeal swab specimens and should not be used as a sole basis for treatment. Nasal washings and aspirates are unacceptable for Xpert Xpress SARS-CoV-2/FLU/RSV testing.  Fact Sheet for Patients: BloggerCourse.com  Fact Sheet for Healthcare Providers: SeriousBroker.it  This test is not yet approved or cleared by the [place]  FDA and has been authorized for  detection and/or diagnosis of SARS-CoV-2 by FDA under an Emergency Use Authorization (EUA). This EUA will remain in effect (meaning this test can be used) for the duration of the COVID-19 declaration under Section 564(b)(1) of the Act, 21 U.S.C. section 360bbb-3(b)(1), unless the authorization is terminated or revoked.     Resp Syncytial Virus by PCR NEGATIVE NEGATIVE Final    Comment: (NOTE) Fact Sheet for Patients: BloggerCourse.com  Fact Sheet for Healthcare Providers: SeriousBroker.it  This test is not yet approved or cleared by the [place]  FDA and has been authorized for detection and/or diagnosis of SARS-CoV-2  by FDA under an Emergency Use Authorization (EUA). This EUA will remain in effect (meaning this test can be used) for the duration of the COVID-19 declaration under Section 564(b)(1) of the Act, 21 U.S.C. section 360bbb-3(b)(1), unless the authorization is terminated or revoked.  Performed at [facility], [address] [place], [place] [address]   Urine Culture     Status: Abnormal   Collection Time: 03/13/24  9:45 PM   Specimen: Urine, Random  Result Value Ref Range Status   Specimen Description   Final    URINE, RANDOM Performed at [facility], [address], [place], [place] [address]    Special Requests   Final    NONE Reflexed from [id number] Performed at [facility], [address], [place], [place] [address]    Culture >=100,000 COLONIES/mL KLEBSIELLA PNEUMONIAE (A)  Final   Report Status 03/15/2024 FINAL  Final   Organism ID, Bacteria KLEBSIELLA PNEUMONIAE (A)  Final      Susceptibility   Klebsiella pneumoniae - MIC*    AMPICILLIN >=32 RESISTANT Resistant     CEFAZOLIN <=4 SENSITIVE Sensitive     CEFEPIME  <=0.12 SENSITIVE Sensitive     CEFTRIAXONE  <=0.25 SENSITIVE Sensitive     CIPROFLOXACIN <=0.25 SENSITIVE Sensitive     GENTAMICIN <=1 SENSITIVE Sensitive     IMIPENEM <=0.25 SENSITIVE Sensitive     NITROFURANTOIN  32 SENSITIVE Sensitive     TRIMETH/SULFA <=20 SENSITIVE Sensitive     AMPICILLIN/SULBACTAM 4 SENSITIVE Sensitive     PIP/TAZO 8 SENSITIVE Sensitive ug/mL    * >=100,000 COLONIES/mL KLEBSIELLA PNEUMONIAE    FURTHER DISCHARGE INSTRUCTIONS:   Get Medicines reviewed and adjusted: Please take all your medications with you for your next visit with your Primary MD   Laboratory/radiological data: Please request your Primary MD to go over all hospital tests and procedure/radiological results at the follow up, please ask your Primary MD to get all Hospital records sent to his/her office.   In some  cases, they will be blood work, cultures and biopsy results pending at the time of your discharge. Please request that your primary care M.D. goes through all the records of your hospital data and follows up on these results.   Also Note the following: If you experience worsening of your admission symptoms, develop shortness of breath, life threatening emergency, suicidal or homicidal thoughts you must seek medical attention immediately by calling 911 or calling your MD immediately  if symptoms less severe.   You must read complete instructions/literature along with all the possible adverse reactions/side effects for all the Medicines you take and that have been prescribed to you. Take any new Medicines after you have completely understood and accpet all the possible adverse reactions/side effects.    patient was instructed, not to drive, operate heavy machinery, perform activities at heights, swimming or participation in water activities or  provide baby-sitting services while on Pain, Sleep and Anxiety Medications; until their outpatient Physician has advised to do so again. Also recommended to not to take more than prescribed Pain, Sleep and Anxiety Medications.  It is not advisable to combine anxiety, sleep and pain medications without talking with your primary care provider.     Wear Seat belts while driving.   Please note: You were cared for by a hospitalist during your hospital stay. Once you are discharged, your primary care physician will handle any further medical issues. Please note that NO REFILLS for any discharge medications will be authorized once you are discharged, as it is imperative that you return to your primary care physician (or establish a relationship with a primary care physician if you do not have one) for your post hospital discharge needs so that they can reassess your need for medications and monitor your lab values  Time coordinating discharge: Over 30  minutes  SIGNED:   Fredia Skeeter, MD  [facility] 03/16/2024, 9:34 AM *Please note that this is a verbal dictation therefore any spelling or grammatical errors are due to the Dragon Medical One system interpretation. If 7PM-7AM, please contact night-coverage [email or web address]

## 2024-03-16 NOTE — TOC Initial Note (Signed)
 Transition of Care [facility]) - Initial/Assessment Note    Patient Details  Name: Anna Mccoy MRN: [id number] Date of Birth: Apr 29, 1925  Transition of Care [facility]) CM/SW Contact:    Sonda Manuella Quill, RN Phone Number: 03/16/2024, 2:13 PM  Clinical Narrative:                 [facility] consult for d/c planning;  orders received for HHPT/OT; spoke w/ pt and dtr Anna Mccoy [phone]); pt lives at home w/ family; they plan for her to return at d/c; Ms Mccoy will provide transportation; they verified insurance/PCP; pt has not experienced SDOH risks; pt has Rollator, BSC, shower chair; HHPT w/ [facility]; aide w/ [facility]; they would like to con't theses services; W/ Garrett Bong at agency notified; agency contact info placed in follow up provider section of d/c instructions; no TOC needs.  Expected Discharge Plan: Home w Home Health Services Barriers to Discharge: No Barriers Identified   Patient Goals and CMS Choice Patient states their goals for this hospitalization and ongoing recovery are:: home CMS Medicare.gov Compare Post Acute Care list provided to:: Patient Represenative (must comment) Marien Mccoy (dtr))        Expected Discharge Plan and Services   Discharge Planning Services: CM Consult   Living arrangements for the past 2 months: Single Family Home Expected Discharge Date: 03/16/24               DME Arranged: N/A DME Agency: NA       HH Arranged: PT, OT HH Agency: [facility] Date HH Agency Contacted: 03/16/24 Time HH Agency Contacted: 1123 Representative spoke with at [facility] Agency: Bong Dadds  Prior Living Arrangements/Services Living arrangements for the past 2 months: Single Family Home Lives with:: Adult Children Patient language and need for interpreter reviewed:: Yes Do you feel safe going back to the place where you live?: Yes      Need for Family Participation in Patient Care: Yes (Comment) Care giver support system in  place?: Yes (comment) Current home services: DME, Homehealth aide, Home PT (Rollator, BSC, shower chair; HHPT w/ [facility]; aide w/ [facility]) Criminal Activity/Legal Involvement Pertinent to Current Situation/Hospitalization: No - Comment as needed  Activities of Daily Living   ADL Screening (condition at time of admission) Independently performs ADLs?: No Does the patient have a NEW difficulty with bathing/dressing/toileting/self-feeding that is expected to last >3 days?: No Does the patient have a NEW difficulty with getting in/out of bed, walking, or climbing stairs that is expected to last >3 days?: No Does the patient have a NEW difficulty with communication that is expected to last >3 days?: No Is the patient deaf or have difficulty hearing?: No Does the patient have difficulty seeing, even when wearing glasses/contacts?: No Does the patient have difficulty concentrating, remembering, or making decisions?: Yes  Permission Sought/Granted Permission sought to share information with : Case Manager Permission granted to share information with : Yes, Verbal Permission Granted  Share Information with NAME: Case Manager     Permission granted to share info w Relationship: Anna Mccoy (dtr) [phone]     Emotional Assessment Appearance:: Appears stated age Attitude/Demeanor/Rapport: Gracious Affect (typically observed): Accepting Orientation: : Oriented to Self, Oriented to Place, Oriented to  Time, Oriented to Situation Alcohol  / Substance Use: Not Applicable Psych Involvement: No (comment)  Admission diagnosis:  Disorientation [R41.0] Acute cystitis without hematuria [N30.00] Acute encephalopathy [G93.40] Generalized weakness [R53.1] COVID-19 [U07.1] Patient Active Problem List  Diagnosis Date Noted   Protein-calorie malnutrition, severe 03/15/2024   Severe sepsis (HCC) 03/14/2024   COVID-19 03/14/2024   Acute encephalopathy 03/13/2024   COVID-19 virus  infection 03/13/2024   Urinary retention 03/13/2024   Chronic indwelling Foley catheter 03/11/2022   SIADH (syndrome of inappropriate ADH production) (HCC) 01/24/2022   Diverticulosis of colon 03/26/2021   Lumbar spinal stenosis 01/20/2021   Generalized osteoarthritis 01/20/2021   Pure hypercholesterolemia 06/01/2019   Rectal bleeding 02/13/2018    Class: History of   Dysfunction of right eustachian tube 02/06/2018   Presbycusis of both ears 02/06/2018   Sudden idiopathic hearing loss of right ear 01/18/2018   Bilateral impacted cerumen 01/10/2018   Hospital discharge follow-up 12/02/2017   Other fatigue    Syncope 11/10/2017   Encounter for Medicare annual wellness exam 10/08/2017   Abnormal urinalysis 08/22/2017   Gait disturbance 06/06/2017   Frailty 10/07/2016   Kyphosis (acquired) (postural) 10/07/2016   Bilateral leg edema 01/13/2016   Hypertensive heart disease with heart failure (HCC) 01/13/2016   Chronic diastolic heart failure (HCC) 01/13/2016   Blurred vision, bilateral 12/31/2015   Bilateral dry eyes 12/31/2015   Chronic fatigue 12/15/2015   Chronic diarrhea of unknown origin 12/04/2015   Essential tremor 09/29/2015   Aphthous ulcer 09/28/2015   Cardiac arrhythmia 09/28/2015   Cataract 09/28/2015   Cholesterol retinal embolus of both eyes 09/28/2015   History of headache 09/28/2015   Hypertrophic toenail 09/28/2015   Hypokalemia 09/28/2015   Osteoporosis 09/28/2015   Peripheral neuropathy 09/28/2015   Seborrheic dermatitis 09/28/2015   Situational anxiety 09/28/2015   Vitamin D  deficiency 09/28/2015   Weight loss 09/28/2015   History of stroke 09/28/2015   Lower urinary tract infectious disease    Seizure cerebral (HCC)    Cerebral infarction (HCC) 02/02/2015   History of Stroke in 2016 without residual deficits (HCC) 02/02/2015   Rheumatoid arthritis (HCC) 02/02/2015   Headache 02/02/2015   Hypertension 02/02/2015   Hypothyroidism 02/02/2015   Cerebral  infarction (HCC)    Uterovaginal prolapse, complete 04/18/2013   Complete uterovaginal prolapse 04/18/2013   PCP:  Macarthur Elouise SQUIBB, DO Pharmacy:   [facility] [phone] - [place], [place] - [address] [place] [address] Phone: [phone] Fax: [phone]     Social Drivers of Health (SDOH) Social History: SDOH Screenings   Food Insecurity: No Food Insecurity (03/16/2024)  Housing: Low Risk  (03/16/2024)  Transportation Needs: No Transportation Needs (03/16/2024)  Utilities: Not At Risk (03/16/2024)  Financial Resource Strain: Low Risk  (03/08/2020)   Received from [facility] visits prior to 10/14/2022.  Physical Activity: Insufficiently Active (03/08/2020)   Received from [facility] visits prior to 10/14/2022.  Social Connections: Patient Unable To Answer (03/14/2024)  Stress: No Stress Concern Present (03/08/2020)   Received from [facility] visits prior to 10/14/2022.  Tobacco Use: Low Risk  (03/13/2024)   SDOH Interventions: Food Insecurity Interventions: Intervention Not Indicated, Inpatient TOC Housing Interventions: Intervention Not Indicated, Inpatient TOC Transportation Interventions: Intervention Not Indicated, Inpatient TOC Utilities Interventions: Intervention Not Indicated, Inpatient TOC   Readmission Risk Interventions    03/16/2024   11:20 AM  Readmission Risk Prevention Plan  Transportation Screening Complete  PCP or Specialist Appt within 5-7 Days Complete  Home Care Screening Complete  Medication Review (RN CM) Complete

## 2024-03-17 ENCOUNTER — Ambulatory Visit: Admitting: Podiatry

## 2024-03-18 LAB — CULTURE, BLOOD (ROUTINE X 2)
Culture: NO GROWTH
Culture: NO GROWTH

## 2024-03-25 ENCOUNTER — Ambulatory Visit: Payer: Medicare Other | Admitting: Urology

## 2024-05-30 ENCOUNTER — Encounter (HOSPITAL_COMMUNITY): Payer: Self-pay | Admitting: *Deleted

## 2024-05-30 ENCOUNTER — Inpatient Hospital Stay (HOSPITAL_COMMUNITY)
Admission: EM | Admit: 2024-05-30 | Discharge: 2024-06-02 | DRG: 698 | Disposition: A | Discharge status: Home Health | Attending: Internal Medicine | Admitting: Internal Medicine

## 2024-05-30 ENCOUNTER — Other Ambulatory Visit: Payer: Self-pay

## 2024-05-30 ENCOUNTER — Emergency Department (HOSPITAL_COMMUNITY)

## 2024-05-30 DIAGNOSIS — E8809 Other disorders of plasma-protein metabolism, not elsewhere classified: Secondary | ICD-10-CM | POA: Diagnosis present

## 2024-05-30 DIAGNOSIS — A4150 Gram-negative sepsis, unspecified: Secondary | ICD-10-CM | POA: Diagnosis present

## 2024-05-30 DIAGNOSIS — Z6821 Body mass index (BMI) 21.0-21.9, adult: Secondary | ICD-10-CM

## 2024-05-30 DIAGNOSIS — T83511A Infection and inflammatory reaction due to indwelling urethral catheter, initial encounter: Secondary | ICD-10-CM | POA: Diagnosis not present

## 2024-05-30 DIAGNOSIS — M810 Age-related osteoporosis without current pathological fracture: Secondary | ICD-10-CM | POA: Diagnosis present

## 2024-05-30 DIAGNOSIS — Z882 Allergy status to sulfonamides status: Secondary | ICD-10-CM

## 2024-05-30 DIAGNOSIS — Z66 Do not resuscitate: Secondary | ICD-10-CM | POA: Diagnosis present

## 2024-05-30 DIAGNOSIS — E441 Mild protein-calorie malnutrition: Secondary | ICD-10-CM | POA: Diagnosis present

## 2024-05-30 DIAGNOSIS — Z8673 Personal history of transient ischemic attack (TIA), and cerebral infarction without residual deficits: Secondary | ICD-10-CM

## 2024-05-30 DIAGNOSIS — R6520 Severe sepsis without septic shock: Secondary | ICD-10-CM | POA: Diagnosis present

## 2024-05-30 DIAGNOSIS — G629 Polyneuropathy, unspecified: Secondary | ICD-10-CM | POA: Diagnosis present

## 2024-05-30 DIAGNOSIS — R6889 Other general symptoms and signs: Secondary | ICD-10-CM | POA: Diagnosis present

## 2024-05-30 DIAGNOSIS — Z823 Family history of stroke: Secondary | ICD-10-CM

## 2024-05-30 DIAGNOSIS — G40909 Epilepsy, unspecified, not intractable, without status epilepticus: Secondary | ICD-10-CM | POA: Diagnosis present

## 2024-05-30 DIAGNOSIS — E039 Hypothyroidism, unspecified: Secondary | ICD-10-CM | POA: Diagnosis present

## 2024-05-30 DIAGNOSIS — N390 Urinary tract infection, site not specified: Principal | ICD-10-CM

## 2024-05-30 DIAGNOSIS — I5032 Chronic diastolic (congestive) heart failure: Secondary | ICD-10-CM | POA: Diagnosis present

## 2024-05-30 DIAGNOSIS — D509 Iron deficiency anemia, unspecified: Secondary | ICD-10-CM | POA: Diagnosis present

## 2024-05-30 DIAGNOSIS — N3000 Acute cystitis without hematuria: Secondary | ICD-10-CM | POA: Diagnosis not present

## 2024-05-30 DIAGNOSIS — Z881 Allergy status to other antibiotic agents status: Secondary | ICD-10-CM

## 2024-05-30 DIAGNOSIS — Z79899 Other long term (current) drug therapy: Secondary | ICD-10-CM

## 2024-05-30 DIAGNOSIS — M069 Rheumatoid arthritis, unspecified: Secondary | ICD-10-CM | POA: Diagnosis present

## 2024-05-30 DIAGNOSIS — Y846 Urinary catheterization as the cause of abnormal reaction of the patient, or of later complication, without mention of misadventure at the time of the procedure: Secondary | ICD-10-CM | POA: Diagnosis present

## 2024-05-30 DIAGNOSIS — I110 Hypertensive heart disease with heart failure: Secondary | ICD-10-CM | POA: Diagnosis present

## 2024-05-30 DIAGNOSIS — Z8249 Family history of ischemic heart disease and other diseases of the circulatory system: Secondary | ICD-10-CM

## 2024-05-30 DIAGNOSIS — Z888 Allergy status to other drugs, medicaments and biological substances status: Secondary | ICD-10-CM

## 2024-05-30 DIAGNOSIS — Z79890 Hormone replacement therapy (postmenopausal): Secondary | ICD-10-CM

## 2024-05-30 DIAGNOSIS — M3500 Sicca syndrome, unspecified: Secondary | ICD-10-CM | POA: Diagnosis present

## 2024-05-30 DIAGNOSIS — Z7982 Long term (current) use of aspirin: Secondary | ICD-10-CM

## 2024-05-30 DIAGNOSIS — E222 Syndrome of inappropriate secretion of antidiuretic hormone: Secondary | ICD-10-CM | POA: Diagnosis present

## 2024-05-30 DIAGNOSIS — I4510 Unspecified right bundle-branch block: Secondary | ICD-10-CM | POA: Diagnosis present

## 2024-05-30 DIAGNOSIS — B965 Pseudomonas (aeruginosa) (mallei) (pseudomallei) as the cause of diseases classified elsewhere: Secondary | ICD-10-CM | POA: Diagnosis present

## 2024-05-30 DIAGNOSIS — N3001 Acute cystitis with hematuria: Secondary | ICD-10-CM | POA: Diagnosis present

## 2024-05-30 DIAGNOSIS — Z833 Family history of diabetes mellitus: Secondary | ICD-10-CM

## 2024-05-30 LAB — URINALYSIS, ROUTINE W REFLEX MICROSCOPIC
Bilirubin Urine: NEGATIVE
Glucose, UA: NEGATIVE mg/dL
Ketones, ur: NEGATIVE mg/dL
Nitrite: NEGATIVE
Protein, ur: 100 mg/dL — AB
RBC / HPF: >50 RBC/hpf (ref 0–5)
Specific Gravity, Urine: 1.01 (ref 1.005–1.030)
WBC, UA: >50 WBC/hpf (ref 0–5)
pH: 8 (ref 5.0–8.0)

## 2024-05-30 LAB — I-STAT CG4 LACTIC ACID, ED: Lactic Acid, Venous: 3.2 mmol/L (ref 0.5–1.9)

## 2024-05-30 LAB — CBC WITH DIFFERENTIAL/PLATELET
Abs Immature Granulocytes: 0.08 K/uL — ABNORMAL HIGH (ref 0.00–0.07)
Basophils Absolute: 0 K/uL (ref 0.0–0.1)
Basophils Relative: 0 %
Eosinophils Absolute: 0.1 K/uL (ref 0.0–0.5)
Eosinophils Relative: 0 %
HCT: 36.9 % (ref 36.0–46.0)
Hemoglobin: 12.1 g/dL (ref 12.0–15.0)
Immature Granulocytes: 0 %
Lymphocytes Relative: 13 %
Lymphs Abs: 2.7 K/uL (ref 0.7–4.0)
MCH: 32.4 pg (ref 26.0–34.0)
MCHC: 32.8 g/dL (ref 30.0–36.0)
MCV: 98.9 fL (ref 80.0–100.0)
Monocytes Absolute: 1.7 K/uL — ABNORMAL HIGH (ref 0.1–1.0)
Monocytes Relative: 9 %
Neutro Abs: 15.5 K/uL — ABNORMAL HIGH (ref 1.7–7.7)
Neutrophils Relative %: 78 %
Platelets: 219 K/uL (ref 150–400)
RBC: 3.73 MIL/uL — ABNORMAL LOW (ref 3.87–5.11)
RDW: 14.6 % (ref 11.5–15.5)
WBC: 20.1 K/uL — ABNORMAL HIGH (ref 4.0–10.5)
nRBC: 0 % (ref 0.0–0.2)

## 2024-05-30 LAB — HEPATIC FUNCTION PANEL
ALT: 18 U/L (ref 0–44)
AST: 32 U/L (ref 15–41)
Albumin: 3.5 g/dL (ref 3.5–5.0)
Alkaline Phosphatase: 76 U/L (ref 38–126)
Bilirubin, Direct: <0.1 mg/dL (ref 0.0–0.2)
Total Bilirubin: <0.2 mg/dL (ref 0.0–1.2)
Total Protein: 6.7 g/dL (ref 6.5–8.1)

## 2024-05-30 LAB — BASIC METABOLIC PANEL WITH GFR
Anion gap: 12 (ref 5–15)
BUN: 28 mg/dL — ABNORMAL HIGH (ref 8–23)
CO2: 26 mmol/L (ref 22–32)
Calcium: 9.2 mg/dL (ref 8.9–10.3)
Chloride: 99 mmol/L (ref 98–111)
Creatinine, Ser: 0.8 mg/dL (ref 0.44–1.00)
GFR, Estimated: >60 mL/min (ref >60)
Glucose, Bld: 109 mg/dL — ABNORMAL HIGH (ref 70–99)
Potassium: 4.1 mmol/L (ref 3.5–5.1)
Sodium: 136 mmol/L (ref 135–145)

## 2024-05-30 MED ORDER — LACTATED RINGERS IV BOLUS
500.0000 mL | Freq: Once | INTRAVENOUS | Status: AC
  Administered 2024-05-30: 500 mL via INTRAVENOUS

## 2024-05-30 MED ORDER — LACTATED RINGERS IV BOLUS
1000.0000 mL | Freq: Once | INTRAVENOUS | Status: AC
  Administered 2024-05-30: 1000 mL via INTRAVENOUS

## 2024-05-30 MED ORDER — LACTATED RINGERS IV BOLUS: 500.0000 mL | Freq: Once | INTRAVENOUS | Status: DC

## 2024-05-30 MED ORDER — SODIUM CHLORIDE 0.9 % IV SOLN
1.0000 g | Freq: Once | INTRAVENOUS | Status: AC
  Administered 2024-05-30: 1 g via INTRAVENOUS
  Filled 2024-05-30: qty 10

## 2024-05-30 NOTE — ED Triage Notes (Signed)
 BIB [facility] from home for hematuria in catheter bag. H/o similar q2 months. Denies pain, sob, cough, fever, or NVD. Endorses hematuria in catheter bag, anorexia, no appetite, decreased intake and output, warm to touch, fatigue, malaise, not feeling self, feeling shaky and unwell. NSL 22g R hand by EMS. VSS. RR 36. Warm to touch, 97.7 temp for EMS. 99.1 Rectally on arrival. 110/86, HR 70, SPO2 98%. CBG 181. Lives with her 2 daughters. Daughter at [employer]. A&Ox3/4. Denies cognitive issues.

## 2024-05-30 NOTE — ED Provider Notes (Signed)
 [place] EMERGENCY DEPARTMENT AT [facility] Provider Note   CSN: [id number] Arrival date & time: 05/30/24  1753     Patient presents with: Hematuria   Anna Mccoy is a 89 y.o. female.    Hematuria     Patient has a history of prior stroke rheumatoid arthritis seizures hypertension, CHF chronic wheelchair use, neuropathy, SIADH, lumbar spinal stenosis, sepsis, urinary retention.  Patient started to feel somewhat shaky and had generalized malaise earlier today.  Patient states that symptoms have now resolved and she is feeling better.  Family had also noticed some increased discoloration of her urine.  Last time her catheter was changed was the end of September.  No known fevers.  No vomiting or diarrhea.  No cough no chest pain no abdominal pain  Prior to Admission medications   Medication Sig Start Date End Date Taking? Authorizing Provider  Abatacept  (ORENCIA  [place]) Inject 1 Syringe into the skin every 30 (thirty) days.    [provider]  acetaminophen  (TYLENOL ) 500 MG tablet Take 500 mg by mouth at bedtime.    [provider]  amLODipine  (NORVASC ) 5 MG tablet Take 5 mg by mouth at bedtime.    [provider]  aspirin  81 MG tablet Take 81 mg by mouth daily.     [provider]  atorvastatin  (LIPITOR) 20 MG tablet Take 20 mg by mouth every evening.     [provider]  carvedilol  (COREG ) 6.25 MG tablet Take 6.25 mg by mouth 2 (two) times daily. 11/18/21   [provider]  Cholecalciferol (VITAMIN D ) 50 MCG (2000 UT) tablet Take 2,000 Units by mouth daily.    [provider]  divalproex  (DEPAKOTE ) 250 MG DR tablet Take 1 tablet (250 mg total) by mouth every 12 (twelve) hours. 02/05/15   Samtani, Jai-Gurmukh, MD  Docusate Sodium  (COLACE PO) Take 2 tablets by mouth at bedtime.    [provider]  famotidine  (PEPCID ) 20 MG tablet Take 20 mg by mouth daily. 09/25/23   [provider]   fluticasone (FLONASE) 50 MCG/ACT nasal spray Place 1 spray into the nose. 01/16/24   [provider]  folic acid  (FOLVITE ) 1 MG tablet Take 1 mg by mouth daily.    [provider]  furosemide  (LASIX ) 20 MG tablet Take 20 mg by mouth every Monday, Wednesday, and Friday. 10/20/16   [provider]  hypromellose (GENTEAL) 0.3 % GEL ophthalmic ointment Place 1 application  into both eyes See admin instructions. Gel - Every day except the days using Tobradex    [provider]  levothyroxine  (SYNTHROID ) 75 MCG tablet Take 75 mcg by mouth daily before breakfast.    [provider]  Multiple Vitamins-Minerals (PRESERVISION AREDS PO) Take 1 capsule by mouth 2 (two) times daily.    [provider]  olmesartan  (BENICAR ) 40 MG tablet Take 40 mg by mouth daily. 11/18/21   [provider]  oxybutynin  (DITROPAN ) 5 MG tablet Take 1 tablet (5 mg total) by mouth every 8 (eight) hours as needed for bladder spasms (and foley leakage). Patient not taking: Reported on 11/27/2023 11/23/21   Summerlin, Julienne Annette, PA-C  POLY-IRON  150 150 MG capsule Take 150 mg by mouth daily.  10/13/15   [provider]  primidone  (MYSOLINE ) 50 MG tablet Take 50 mg by mouth daily. 09/20/16   [provider]  Probiotic Product (PROBIOTIC PO) Take 1 capsule by mouth daily.    [provider]  BENNY  ophthalmic ointment Place 1 Application into both eyes See admin instructions. Once every 3 days PRN 11/22/21   [provider]    Allergies: Ace inhibitors, Bacitracin-polymyxin b, Ciprofloxacin, Lidocaine, Neosporin [neomycin-bacitracin zn-polymyx], and Sulfa antibiotics    Review of Systems  Genitourinary:  Positive for hematuria.    Updated Vital Signs BP (!) 157/72   Pulse 86   Temp 98.3 F (36.8 C) (Oral)   Resp (!) 22   Wt 49.9 kg   LMP 08/14/1981 (Approximate)   SpO2 97%   BMI 21.48 kg/m   Physical Exam Vitals and nursing note  reviewed.  Constitutional:      Appearance: She is well-developed. She is not diaphoretic.     Comments: Elderly, frail  HENT:     Head: Normocephalic and atraumatic.     Right Ear: External ear normal.     Left Ear: External ear normal.  Eyes:     General: No scleral icterus.       Right eye: No discharge.        Left eye: No discharge.     Conjunctiva/sclera: Conjunctivae normal.  Neck:     Trachea: No tracheal deviation.  Cardiovascular:     Rate and Rhythm: Normal rate and regular rhythm.  Pulmonary:     Effort: Pulmonary effort is normal. No respiratory distress.     Breath sounds: Normal breath sounds. No stridor. No wheezing or rales.  Abdominal:     General: Bowel sounds are normal. There is no distension.     Palpations: Abdomen is soft.     Tenderness: There is no abdominal tenderness. There is no guarding or rebound.  Genitourinary:    Comments: Cloudy amber urine noted in the catheter bag Musculoskeletal:        General: No tenderness or deformity.     Cervical back: Neck supple.  Skin:    General: Skin is warm and dry.     Findings: No rash.  Neurological:     General: No focal deficit present.     Mental Status: She is alert.     Cranial Nerves: No cranial nerve deficit, dysarthria or facial asymmetry.     Sensory: No sensory deficit.     Motor: No abnormal muscle tone or seizure activity.     Coordination: Coordination normal.  Psychiatric:        Mood and Affect: Mood normal.     (all labs ordered are listed, but only abnormal results are displayed) Labs Reviewed  CBC WITH DIFFERENTIAL/PLATELET - Abnormal; Notable for the following components:      Result Value   WBC 20.1 (*)    RBC 3.73 (*)    Neutro Abs 15.5 (*)    Monocytes Absolute 1.7 (*)    Abs Immature Granulocytes 0.08 (*)    All other components within normal limits  BASIC METABOLIC PANEL WITH GFR - Abnormal; Notable for the following components:   Glucose, Bld 109 (*)    BUN 28 (*)     All other components within normal limits  URINALYSIS, ROUTINE W REFLEX MICROSCOPIC - Abnormal; Notable for the following components:   APPearance CLOUDY (*)    Hgb urine dipstick MODERATE (*)    Protein, ur 100 (*)    Leukocytes,Ua LARGE (*)    Bacteria, UA RARE (*)    All other components within normal limits  I-STAT CG4 LACTIC ACID, ED - Abnormal; Notable for the following components:   Lactic Acid, Venous 3.2 (*)  All other components within normal limits  CULTURE, BLOOD (ROUTINE X 2)  CULTURE, BLOOD (ROUTINE X 2)  URINE CULTURE  HEPATIC FUNCTION PANEL  I-STAT CG4 LACTIC ACID, ED    EKG: EKG Interpretation Date/Time:  Friday May 30 2024 20:01:22 EDT Ventricular Rate:  82 PR Interval:  142 QRS Duration:  116 QT Interval:  402 QTC Calculation: 470 R Axis:   -73  Text Interpretation: Sinus rhythm Incomplete right bundle branch block Anterolateral infarct, old Confirmed by Randol Simmonds [phone]) on 05/30/2024 10:31:33 PM  Radiology: DG Chest Portable 1 View Result Date: 05/30/2024 EXAM: 1 VIEW(S) XRAY OF THE CHEST 05/30/2024 06:32:00 PM COMPARISON: 03/13/2024 CLINICAL HISTORY: low grade fever, RR 36. Low grade fever// Respiration rate of 36 - BIB [facility] from home for hematuria in catheter bag. Denies pain, sob, cough, fever, or NVD. Endorses hematuria in catheter bag, anorexia, no appetite, decreased intake and output, warm to touch, fatigue, ; malaise, not feeling self, feeling shaky and unwell. FINDINGS: LUNGS AND PLEURA: No focal pulmonary opacity. No pulmonary edema. No pleural effusion. No pneumothorax. HEART AND MEDIASTINUM: No acute abnormality of the cardiac and mediastinal silhouettes. BONES AND SOFT TISSUES: Thoracic spondylosis and levoscoliosis. Remainder of visualized skeletal structures unremarkable. IMPRESSION: 1. No acute findings. Electronically signed by: Norman Gatlin MD 05/30/2024 07:04 PM EDT RP Workstation: [id number]     Procedures   Medications  Ordered in the ED  lactated ringers  bolus 500 mL (has no administration in time range)  cefTRIAXone  (ROCEPHIN ) 1 g in sodium chloride  0.9 % 100 mL IVPB (1 g Intravenous New Bag/Given 05/30/24 2106)  lactated ringers  bolus 1,000 mL (1,000 mLs Intravenous New Bag/Given 05/30/24 2108)    Clinical Course as of 05/30/24 2358  Fri May 30, 2024  2035 Notified that lactic acid level is elevated at 3.2.  Leukocytosis also noted of white count of 20,000.  Metabolic panel normal.  Urinalysis is currently pending [JK]  2036 Chest x-ray without acute findings [JK]  2036 Basic metabolic panel(!) Metabolic panel normal [JK]  2055 Basic metabolic panel(!) [JK]  2229 Urinalysis, Routine w reflex microscopic -(!) Urinalysis is suggestive of urinary tract infection [JK]  2358 Case discussed with Dr Marcene [JK]    Clinical Course User Index [JK] Randol Simmonds, MD                                 Medical Decision Making Amount and/or Complexity of Data Reviewed Labs: ordered. Decision-making details documented in ED Course. Radiology: ordered.   Patient presented to the ED for evaluation of an episode of weakness malaise.  Patient initially afebrile normotensive in the ED.  Laboratory test however did show elevated white blood cell count as well as lactic acidosis.  She remained hemodynamically stable however was concerned about the possibility of evolving sepsis.  Patient's urinalysis is suggestive of infection.  She does have an indwelling catheter but this was replaced and a sample was obtained after the catheter was changed.  Will continue IV antibiotics fluid hydration I will consult the medical service for admission     Final diagnoses:  Lower urinary tract infectious disease  Acute cystitis with hematuria    ED Discharge Orders     None          Randol Simmonds, MD 05/30/24 2358

## 2024-05-30 NOTE — ED Notes (Signed)
 Xray at [facility]

## 2024-05-31 DIAGNOSIS — I4510 Unspecified right bundle-branch block: Secondary | ICD-10-CM | POA: Diagnosis present

## 2024-05-31 DIAGNOSIS — N390 Urinary tract infection, site not specified: Secondary | ICD-10-CM

## 2024-05-31 DIAGNOSIS — R519 Headache, unspecified: Secondary | ICD-10-CM | POA: Diagnosis present

## 2024-05-31 DIAGNOSIS — I10 Essential (primary) hypertension: Secondary | ICD-10-CM | POA: Diagnosis not present

## 2024-05-31 DIAGNOSIS — Z79890 Hormone replacement therapy (postmenopausal): Secondary | ICD-10-CM | POA: Diagnosis not present

## 2024-05-31 DIAGNOSIS — E441 Mild protein-calorie malnutrition: Secondary | ICD-10-CM | POA: Diagnosis present

## 2024-05-31 DIAGNOSIS — E039 Hypothyroidism, unspecified: Secondary | ICD-10-CM | POA: Diagnosis present

## 2024-05-31 DIAGNOSIS — M069 Rheumatoid arthritis, unspecified: Secondary | ICD-10-CM | POA: Diagnosis present

## 2024-05-31 DIAGNOSIS — T83511A Infection and inflammatory reaction due to indwelling urethral catheter, initial encounter: Secondary | ICD-10-CM | POA: Diagnosis present

## 2024-05-31 DIAGNOSIS — B965 Pseudomonas (aeruginosa) (mallei) (pseudomallei) as the cause of diseases classified elsewhere: Secondary | ICD-10-CM | POA: Diagnosis present

## 2024-05-31 DIAGNOSIS — Z7982 Long term (current) use of aspirin: Secondary | ICD-10-CM | POA: Diagnosis not present

## 2024-05-31 DIAGNOSIS — G40909 Epilepsy, unspecified, not intractable, without status epilepticus: Secondary | ICD-10-CM | POA: Diagnosis present

## 2024-05-31 DIAGNOSIS — N3000 Acute cystitis without hematuria: Secondary | ICD-10-CM | POA: Diagnosis present

## 2024-05-31 DIAGNOSIS — I110 Hypertensive heart disease with heart failure: Secondary | ICD-10-CM | POA: Diagnosis present

## 2024-05-31 DIAGNOSIS — R6889 Other general symptoms and signs: Secondary | ICD-10-CM | POA: Diagnosis present

## 2024-05-31 DIAGNOSIS — G629 Polyneuropathy, unspecified: Secondary | ICD-10-CM | POA: Diagnosis present

## 2024-05-31 DIAGNOSIS — D509 Iron deficiency anemia, unspecified: Secondary | ICD-10-CM | POA: Diagnosis present

## 2024-05-31 DIAGNOSIS — Z66 Do not resuscitate: Secondary | ICD-10-CM | POA: Diagnosis present

## 2024-05-31 DIAGNOSIS — M3500 Sicca syndrome, unspecified: Secondary | ICD-10-CM | POA: Diagnosis present

## 2024-05-31 DIAGNOSIS — E222 Syndrome of inappropriate secretion of antidiuretic hormone: Secondary | ICD-10-CM | POA: Diagnosis present

## 2024-05-31 DIAGNOSIS — E8809 Other disorders of plasma-protein metabolism, not elsewhere classified: Secondary | ICD-10-CM | POA: Diagnosis present

## 2024-05-31 DIAGNOSIS — I5032 Chronic diastolic (congestive) heart failure: Secondary | ICD-10-CM | POA: Diagnosis present

## 2024-05-31 DIAGNOSIS — A4150 Gram-negative sepsis, unspecified: Secondary | ICD-10-CM | POA: Diagnosis present

## 2024-05-31 DIAGNOSIS — Y846 Urinary catheterization as the cause of abnormal reaction of the patient, or of later complication, without mention of misadventure at the time of the procedure: Secondary | ICD-10-CM | POA: Diagnosis present

## 2024-05-31 DIAGNOSIS — R6520 Severe sepsis without septic shock: Secondary | ICD-10-CM | POA: Diagnosis present

## 2024-05-31 DIAGNOSIS — M810 Age-related osteoporosis without current pathological fracture: Secondary | ICD-10-CM | POA: Diagnosis present

## 2024-05-31 DIAGNOSIS — N3001 Acute cystitis with hematuria: Secondary | ICD-10-CM | POA: Diagnosis present

## 2024-05-31 DIAGNOSIS — Z823 Family history of stroke: Secondary | ICD-10-CM | POA: Diagnosis not present

## 2024-05-31 DIAGNOSIS — Z79899 Other long term (current) drug therapy: Secondary | ICD-10-CM | POA: Diagnosis not present

## 2024-05-31 DIAGNOSIS — Z8249 Family history of ischemic heart disease and other diseases of the circulatory system: Secondary | ICD-10-CM | POA: Diagnosis not present

## 2024-05-31 LAB — CBC WITH DIFFERENTIAL/PLATELET
Abs Immature Granulocytes: 0.07 K/uL (ref 0.00–0.07)
Basophils Absolute: 0 K/uL (ref 0.0–0.1)
Basophils Relative: 0 %
Eosinophils Absolute: 0.2 K/uL (ref 0.0–0.5)
Eosinophils Relative: 1 %
HCT: 36.2 % (ref 36.0–46.0)
Hemoglobin: 11.6 g/dL — ABNORMAL LOW (ref 12.0–15.0)
Immature Granulocytes: 1 %
Lymphocytes Relative: 19 %
Lymphs Abs: 2.9 K/uL (ref 0.7–4.0)
MCH: 31.1 pg (ref 26.0–34.0)
MCHC: 32 g/dL (ref 30.0–36.0)
MCV: 97.1 fL (ref 80.0–100.0)
Monocytes Absolute: 1.5 K/uL — ABNORMAL HIGH (ref 0.1–1.0)
Monocytes Relative: 10 %
Neutro Abs: 10.1 K/uL — ABNORMAL HIGH (ref 1.7–7.7)
Neutrophils Relative %: 69 %
Platelets: 205 K/uL (ref 150–400)
RBC: 3.73 MIL/uL — ABNORMAL LOW (ref 3.87–5.11)
RDW: 14.6 % (ref 11.5–15.5)
WBC: 14.8 K/uL — ABNORMAL HIGH (ref 4.0–10.5)
nRBC: 0 % (ref 0.0–0.2)

## 2024-05-31 LAB — COMPREHENSIVE METABOLIC PANEL WITH GFR
ALT: 14 U/L (ref 0–44)
AST: 29 U/L (ref 15–41)
Albumin: 3.1 g/dL — ABNORMAL LOW (ref 3.5–5.0)
Alkaline Phosphatase: 61 U/L (ref 38–126)
Anion gap: 10 (ref 5–15)
BUN: 17 mg/dL (ref 8–23)
CO2: 26 mmol/L (ref 22–32)
Calcium: 8.9 mg/dL (ref 8.9–10.3)
Chloride: 98 mmol/L (ref 98–111)
Creatinine, Ser: 0.63 mg/dL (ref 0.44–1.00)
GFR, Estimated: >60 mL/min (ref >60)
Glucose, Bld: 94 mg/dL (ref 70–99)
Potassium: 3.9 mmol/L (ref 3.5–5.1)
Sodium: 134 mmol/L — ABNORMAL LOW (ref 135–145)
Total Bilirubin: 0.4 mg/dL (ref 0.0–1.2)
Total Protein: 6.1 g/dL — ABNORMAL LOW (ref 6.5–8.1)

## 2024-05-31 LAB — MAGNESIUM: Magnesium: 2.2 mg/dL (ref 1.7–2.4)

## 2024-05-31 LAB — I-STAT CG4 LACTIC ACID, ED: Lactic Acid, Venous: 1.9 mmol/L (ref 0.5–1.9)

## 2024-05-31 MED ORDER — ACETAMINOPHEN 650 MG RE SUPP: 650.0000 mg | Freq: Four times a day (QID) | RECTAL | Status: DC | PRN

## 2024-05-31 MED ORDER — POLYVINYL ALCOHOL 1.4 % OP SOLN
1.0000 [drp] | Freq: Every day | OPHTHALMIC | Status: DC
  Administered 2024-05-31 – 2024-06-02 (×2): 1 [drp] via OPHTHALMIC
  Filled 2024-05-31 (×2): qty 15

## 2024-05-31 MED ORDER — SODIUM CHLORIDE 0.9 % IV SOLN: 1.0000 g | INTRAVENOUS | Status: DC

## 2024-05-31 MED ORDER — MELATONIN 3 MG PO TABS: 3.0000 mg | ORAL_TABLET | Freq: Every evening | ORAL | Status: DC | PRN

## 2024-05-31 MED ORDER — SODIUM CHLORIDE 0.9 % IV SOLN
2.0000 g | INTRAVENOUS | Status: DC
  Administered 2024-05-31 – 2024-06-01 (×2): 2 g via INTRAVENOUS
  Filled 2024-05-31 (×2): qty 20

## 2024-05-31 MED ORDER — LACTATED RINGERS IV SOLN: INTRAVENOUS | Status: DC

## 2024-05-31 MED ORDER — ENOXAPARIN SODIUM 30 MG/0.3ML IJ SOSY
30.0000 mg | PREFILLED_SYRINGE | Freq: Every day | INTRAMUSCULAR | Status: DC
  Administered 2024-05-31 – 2024-06-02 (×3): 30 mg via SUBCUTANEOUS
  Filled 2024-05-31 (×3): qty 0.3

## 2024-05-31 MED ORDER — ATORVASTATIN CALCIUM 10 MG PO TABS
20.0000 mg | ORAL_TABLET | Freq: Every evening | ORAL | Status: DC
Start: 2024-05-31 — End: 2024-06-02
  Administered 2024-06-01: 20 mg via ORAL
  Filled 2024-05-31 (×3): qty 2

## 2024-05-31 MED ORDER — AMLODIPINE BESYLATE 5 MG PO TABS
5.0000 mg | ORAL_TABLET | Freq: Every day | ORAL | Status: DC
  Administered 2024-05-31 – 2024-06-01 (×3): 5 mg via ORAL
  Filled 2024-05-31 (×3): qty 1

## 2024-05-31 MED ORDER — VITAMIN D 25 MCG (1000 UNIT) PO TABS
2000.0000 [IU] | ORAL_TABLET | Freq: Every day | ORAL | Status: DC
  Administered 2024-05-31 – 2024-06-02 (×3): 2000 [IU] via ORAL
  Filled 2024-05-31 (×3): qty 2

## 2024-05-31 MED ORDER — SODIUM CHLORIDE 0.9% FLUSH
3.0000 mL | Freq: Two times a day (BID) | INTRAVENOUS | Status: DC
  Administered 2024-05-31 – 2024-06-02 (×4): 3 mL via INTRAVENOUS

## 2024-05-31 MED ORDER — ONDANSETRON HCL 4 MG/2ML IJ SOLN: 4.0000 mg | Freq: Four times a day (QID) | INTRAMUSCULAR | Status: DC | PRN

## 2024-05-31 MED ORDER — LEVOTHYROXINE SODIUM 75 MCG PO TABS
75.0000 ug | ORAL_TABLET | Freq: Every day | ORAL | Status: DC
  Administered 2024-06-01 – 2024-06-02 (×2): 75 ug via ORAL
  Filled 2024-05-31 (×2): qty 1

## 2024-05-31 MED ORDER — ARTIFICIAL TEARS OPHTHALMIC OINT
1.0000 | TOPICAL_OINTMENT | Freq: Every day | OPHTHALMIC | Status: DC
  Administered 2024-06-01: 1 via OPHTHALMIC
  Filled 2024-05-31: qty 3.5

## 2024-05-31 MED ORDER — FOLIC ACID 1 MG PO TABS
1.0000 mg | ORAL_TABLET | Freq: Every day | ORAL | Status: DC
  Administered 2024-05-31 – 2024-06-02 (×3): 1 mg via ORAL
  Filled 2024-05-31 (×3): qty 1

## 2024-05-31 MED ORDER — PRIMIDONE 50 MG PO TABS
50.0000 mg | ORAL_TABLET | Freq: Every day | ORAL | Status: DC
  Administered 2024-05-31 – 2024-06-02 (×3): 50 mg via ORAL
  Filled 2024-05-31 (×3): qty 1

## 2024-05-31 MED ORDER — DIVALPROEX SODIUM 250 MG PO DR TAB
250.0000 mg | DELAYED_RELEASE_TABLET | Freq: Two times a day (BID) | ORAL | Status: DC
  Administered 2024-05-31 – 2024-06-02 (×5): 250 mg via ORAL
  Filled 2024-05-31 (×5): qty 1

## 2024-05-31 MED ORDER — ASPIRIN 81 MG PO TBEC
81.0000 mg | DELAYED_RELEASE_TABLET | Freq: Every day | ORAL | Status: DC
  Administered 2024-05-31 – 2024-06-02 (×3): 81 mg via ORAL
  Filled 2024-05-31 (×3): qty 1

## 2024-05-31 MED ORDER — FAMOTIDINE 20 MG PO TABS
20.0000 mg | ORAL_TABLET | Freq: Every day | ORAL | Status: DC
  Administered 2024-06-01 – 2024-06-02 (×2): 20 mg via ORAL
  Filled 2024-05-31 (×2): qty 1

## 2024-05-31 MED ORDER — DOCUSATE SODIUM 100 MG PO CAPS
200.0000 mg | ORAL_CAPSULE | Freq: Every day | ORAL | Status: DC
Start: 2024-05-31 — End: 2024-06-02
  Administered 2024-05-31 – 2024-06-01 (×2): 200 mg via ORAL
  Filled 2024-05-31 (×2): qty 2

## 2024-05-31 MED ORDER — POLYSACCHARIDE IRON COMPLEX 150 MG PO CAPS
150.0000 mg | ORAL_CAPSULE | Freq: Every day | ORAL | Status: DC
  Administered 2024-05-31 – 2024-06-02 (×3): 150 mg via ORAL
  Filled 2024-05-31 (×3): qty 1

## 2024-05-31 MED ORDER — HYDRALAZINE HCL 20 MG/ML IJ SOLN: 10.0000 mg | INTRAMUSCULAR | Status: DC | PRN

## 2024-05-31 MED ORDER — ACETAMINOPHEN 325 MG PO TABS
650.0000 mg | ORAL_TABLET | Freq: Four times a day (QID) | ORAL | Status: DC | PRN
  Administered 2024-05-31 – 2024-06-01 (×3): 650 mg via ORAL
  Filled 2024-05-31 (×3): qty 2

## 2024-05-31 NOTE — ED Notes (Addendum)
 Checked in on Istat that was collected at 2248 and it was not ran and no where to be found in mini lab. Nurse will obtain more blood to obtain Istat lactic at this time.

## 2024-05-31 NOTE — Progress Notes (Signed)
  Carryover admission to the Day Admitter.  I discussed this case with the EDP, Dr. Randol.  Per these discussions:   This is a 89 year old female with chronic indwelling Foley catheter, who is being admitted for severe sepsis due to acute cystitis after presenting from home complaining of 1 to 2 days of subjective fever, chills, malaise in the absence of any recent chest pain, cough, shortness of breath, or abdominal discomfort..   Vital signs in the ED were notable for afebrile; heart rates in the 80s; initial systolic pressures in the 120s, with ensuing increase in systolic blood pressures into the 150s to 170s, respiratory rate 16-22, and oxygen saturation 94 to 98% on room air.  The patient's chronic indwelling Foley catheter was replaced this evening in the ED, and urine sample was subsequently collected following replacement of the indwelling Foley catheter.  Associated urinalysis was reported to be consistent with urinary tract infection.  Additional labs notable for CBC which showed white blood cell count of 20,000, initial lactic acid 3.2, with repeat lactate trended down to 1.9.  Blood cultures x 2 and urine culture were collected prior to initiation of Rocephin .  Additionally, in the ED, the patient received a 1.5 L lactated ringer  bolus.  Patient's family, present at bedside, reported to have questions regarding timing of resumption of the patient's home antihypertensive medications noting increasing blood pressure and missed doses of multiple of the patient's home evening blood pressure medications, including amlodipine  as well as Coreg .  I have placed an order for inpatient admission for further evaluation and management of the above.  I have placed some additional preliminary admit orders via the adult multi-morbid admission order set. I have also ordered continuation of Rocephin , and ordered morning labs to include CMP, CBC, and magnesium level.  Regarding her hypertension, I have  resumed amlodipine  for now, and have ordered as needed IV hydralazine  for systolic blood pressure greater than 180 mmHg.   Of note, based upon code status documentation from most recent prior hospitalization, I have set her current code status as full code pending further discussion by the admitting hospitalist.     Eva Pore, DO Hospitalist

## 2024-05-31 NOTE — H&P (Signed)
 History and Physical    Patient: Anna Mccoy FMW:[id number] DOB: 15-Apr-1925 DOA: 05/30/2024 DOS: the patient was seen and examined on 05/31/2024 PCP: Macarthur Elouise SQUIBB, DO  Patient coming from: Home  Chief Complaint:  Chief Complaint  Patient presents with   Hematuria   HPI: Anna Mccoy is a 89 y.o. female with medical history significant of CVA in 2016, rheumatoid arthritis, hypothyroidism, hypertension, seizure disorder,HFpEF, recurrent UTI in context of chronic Foley catheter due to urinary retention, and SIADH.  Patient was brought to the ED by EMS on 10/17 by family due to changes in appetite, decreased intake and output, suspected fever, fatigue and malaise.  It was noted that she had been having intermittent hematuria in her catheter bag for about 2 months but urine at this point was more dark than baseline.  Patient had a low-grade temperature upon arrival but otherwise vital signs were stable.  Initial WBC was 20,100 with normal differential.  Initial lactic acid was 3.2.  Urinalysis was abnormal with cloudy appearance, moderate hemoglobin, large leukocytes, 100 of protein, rare bacteria, greater than 50 WBCs and RBCs.  Blood culture and urine culture have been obtained.  Patient was given empiric Rocephin  by the ER.  Chest x-ray without any active disease.  CT of the head no acute abnormalities.  Hospitalist service was asked to evaluate the patient for admission   Review of Systems: As mentioned in the history of present illness. All other systems reviewed and are negative.   Past Medical History:  Diagnosis Date   Anemia    Arthritis    Cerebral infarction due to unspecified mechanism    Cholesterol retinal embolus of both eyes 09/28/2015   Chronic diarrhea of unknown origin 12/04/2015   Chronic diastolic CHF (congestive heart failure) (HCC)    CVA (cerebral infarction) 02/02/2015   DDD (degenerative disc disease), lumbar    Edema    Essential tremor 09/29/2015    Gait disturbance 06/06/2017   Hx-TIA (transient ischemic attack)    Hypertension    Hypertensive heart disease with heart failure (HCC) 01/13/2016   Hypertrophic toenail 09/28/2015   Hypothyroidism    HYPOTHYROIDISM   Impacted cerumen of right ear 01/10/2018   Kyphosis (acquired) (postural) 10/07/2016   Menopausal state age 31   Mitral valve regurgitation    TRIVIAL   Osteoporosis 09/28/2015   Peripheral neuropathy 09/28/2015   Post-menopausal bleeding 02/2011   Endo Biopsy 7/12 benign, no hyperplasia   Procidentia of uterus    uses pessary   PVC's (premature ventricular contractions)    RBBB    Rheumatoid arthritis(714.0)    Seizure cerebral (HCC)    Stroke (HCC) 1996   pt estimation   Sudden idiopathic hearing loss of right ear 01/18/2018   Syncope 11/10/2017   Vitamin D  deficiency disease    Past Surgical History:  Procedure Laterality Date   BACK SURGERY     CHOLECYSTECTOMY, LAPAROSCOPIC  2002   COLONOSCOPY W/ BIOPSIES  6/09   sigmoid diverticuli recheck 5 years   EYE SURGERY  04/24/14   Cataract sx in right eye with lens replacement.   LAMINECTOMY AND MICRODISCECTOMY LUMBAR SPINE  01/23/09   VAGINAL DELIVERY     x5   Social History:  reports that she has never smoked. She has never used smokeless tobacco. She reports that she does not drink alcohol  and does not use drugs.  Allergies  Allergen Reactions   Ace Inhibitors Cough   Bacitracin-Polymyxin B  Other reaction(s): Unknown   Ciprofloxacin Diarrhea   Lidocaine Other (See Comments)    unknown   Neosporin [Neomycin-Bacitracin Zn-Polymyx] Other (See Comments)    unknown   Sulfa Antibiotics Other (See Comments)    unknown    Family History  Problem Relation Age of Onset   Hypertension Mother    Stroke Mother    Hypertension Sister    Diabetes Sister    Hypertension Sister    Diabetes Sister     Prior to Admission medications   Medication Sig Start Date End Date Taking? Authorizing Provider  Abatacept   (ORENCIA  [place]) Inject 1 Syringe into the skin every 30 (thirty) days.   Yes [provider]  amLODipine  (NORVASC ) 5 MG tablet Take 5 mg by mouth at bedtime.   Yes [provider]  aspirin  81 MG tablet Take 81 mg by mouth daily.    Yes [provider]  atorvastatin  (LIPITOR) 20 MG tablet Take 20 mg by mouth every evening.    Yes [provider]  carvedilol  (COREG ) 6.25 MG tablet Take 6.25 mg by mouth 2 (two) times daily. 11/18/21  Yes [provider]  Cholecalciferol (VITAMIN D ) 50 MCG (2000 UT) tablet Take 2,000 Units by mouth daily.   Yes [provider]  Dextran 70-Hypromellose 0.1-0.3 % SOLN Apply 1 drop to eye daily at 6 (six) AM. 09/10/18  Yes [provider]  divalproex  (DEPAKOTE ) 250 MG DR tablet Take 1 tablet (250 mg total) by mouth every 12 (twelve) hours. 02/05/15  Yes Samtani, Jai-Gurmukh, MD  Docusate Sodium  (COLACE PO) Take 2 tablets by mouth at bedtime.   Yes [provider]  famotidine  (PEPCID ) 20 MG tablet Take 20 mg by mouth daily. 09/25/23  Yes [provider]  fluticasone (FLONASE) 50 MCG/ACT nasal spray Place 1 spray into the nose. 01/16/24  Yes [provider]  folic acid  (FOLVITE ) 1 MG tablet Take 1 mg by mouth daily.   Yes [provider]  furosemide  (LASIX ) 20 MG tablet Take 20 mg by mouth every Monday, Wednesday, and Friday. 10/20/16  Yes [provider]  hypromellose (GENTEAL) 0.3 % GEL ophthalmic ointment Place 1 Application into both eyes at bedtime.   Yes [provider]  levothyroxine  (SYNTHROID ) 75 MCG tablet Take 75 mcg by mouth daily before breakfast.   Yes [provider]  Multiple Vitamins-Minerals (PRESERVISION AREDS PO) Take 1 capsule by mouth 2 (two) times daily.   Yes [provider]  olmesartan  (BENICAR ) 40 MG tablet Take 40 mg by mouth daily. 11/18/21  Yes [provider]  POLY-IRON  150 150 MG capsule Take 150 mg by mouth daily.   10/13/15  Yes [provider]  primidone  (MYSOLINE ) 50 MG tablet Take 50 mg by mouth daily. 09/20/16  Yes [provider]  Probiotic Product (PROBIOTIC PO) Take 1 capsule by mouth daily.   Yes [provider]  TOBRADEX ophthalmic ointment Place 1 Application into both eyes See admin instructions. Once every 3 days PRN 11/22/21  Yes [provider]  acetaminophen  (TYLENOL ) 500 MG tablet Take 500 mg by mouth at bedtime.    [provider]  hypromellose (GENTEAL) 0.3 % GEL ophthalmic ointment Place 1 application  into both eyes See admin instructions. Gel - Every day except the days using Tobradex Patient not taking: Reported on 05/31/2024    [provider]  oxybutynin  (DITROPAN ) 5 MG tablet Take 1 tablet (5 mg total) by mouth every 8 (eight) hours as needed  for bladder spasms (and foley leakage). Patient not taking: Reported on 11/27/2023 11/23/21   Summerlin, Julienne Annette, [place]    Physical Exam: Vitals:   05/31/24 0200 05/31/24 0300 05/31/24 0400 05/31/24 0500  BP: (!) 176/91 (!) 167/87 (!) 164/91 127/68  Pulse: 82 83 81 73  Resp: (!) 31 (!) 25 (!) 29 (!) 26  Temp: 98.1 F (36.7 C)     TempSrc: Oral     SpO2: 96% 96% 99% 96%  Weight:       Constitutional: NAD, calm, comfortable Respiratory: clear to auscultation bilaterally, no wheezing, no crackles. Normal respiratory effort. No accessory muscle use. RA Cardiovascular: Regular rate and rhythm, no murmurs / rubs / gallops. No extremity edema. 2+ pedal pulses. Abdomen: no tenderness, no masses palpated. No hepatosplenomegaly. Bowel sounds positive.  Genitourinary: Foley catheter in place draining amber-colored urine to bedside bag Musculoskeletal: no clubbing / cyanosis. No joint deformity upper and lower extremities. Good ROM, no contractures. Normal muscle tone.  Skin: no rashes, lesions, ulcers. No induration Neurologic: CN 2-12 grossly intact. Sensation intact,  Strength 3+-4/5 x all  4 extremities.  Psychiatric: Normal judgment and insight. Alert and oriented x 3. Normal mood.     Data Reviewed:  Sodium 134, potassium 3.9, chloride 98, BUN 17, creatinine 0.63, LFTs are normal, albumin 3.1, total protein 6.1  Initial lactic acid 3.2 with follow-up 1.9  White count 14,800, normal differential, hemoglobin 11.6, platelets 205,000  Urine culture and blood cultures pending  Assessment and Plan: Acute cystitis with SIRS physiology Patient presented with symptomatic UTI as evidenced by altered mentation, generalized malaise, anorexia and poor oral intake Since arrival patient has been initiated on IV fluids and IV antibiotics with improvement in lactic acid as well as white count Continue IV Rocephin  Follow-up on urine culture and blood cultures Patient has a history of Klebsiella resistant to ampicillin in July of this year, Citrobacter in February that was pansensitive, E. coli in 2024 that was pansensitive and Pseudomonas in 2023 that was pansensitive.  Chronic Foley catheter POA Chronic urinary retention Per family indwelling catheter last changed at the end of September  RA Currently stable On Orencia  prior to arrival  Hypertension Current blood pressure controlled on home medication: Norvasc   Seizure disorder   Continue Mysoline  and Depakote   HFpEF Last echo in 2023 showed EF 60 to 65%  SIADH Average sodium between 131 and 134  Sjogrens Patient has severe dry eyes Have resumed home lubricant medications  Hypoalbuminemia/protein calorie malnutrition Albumin 3.1 with total protein 6.1 BMI 21.48  Physical deconditioning Utilizes a wheelchair at home Physical therapy while acutely ill     Advance Care Planning:   Code Status: Full Code   VTE prophylaxis: Lovenox   Consults: None  Family Communication: Daughter at bedside  Severity of Illness: The appropriate patient status for this patient is INPATIENT. Inpatient status is judged to be  reasonable and necessary in order to provide the required intensity of service to ensure the patient's safety. The patient's presenting symptoms, physical exam findings, and initial radiographic and laboratory data in the context of their chronic comorbidities is felt to place them at high risk for further clinical deterioration. Furthermore, it is not anticipated that the patient will be medically stable for discharge from the hospital within 2 midnights of admission.   * I certify that at the point of admission it is my clinical judgment that the patient will require inpatient hospital care spanning beyond 2 midnights from the point of  admission due to high intensity of service, high risk for further deterioration and high frequency of surveillance required.*  Author: Isaiah Lever, NP 05/31/2024 6:40 AM  For on call review [email or web address].

## 2024-06-01 DIAGNOSIS — N3001 Acute cystitis with hematuria: Secondary | ICD-10-CM

## 2024-06-01 LAB — BASIC METABOLIC PANEL WITH GFR
Anion gap: 8 (ref 5–15)
BUN: 13 mg/dL (ref 8–23)
CO2: 26 mmol/L (ref 22–32)
Calcium: 8.6 mg/dL — ABNORMAL LOW (ref 8.9–10.3)
Chloride: 100 mmol/L (ref 98–111)
Creatinine, Ser: 0.68 mg/dL (ref 0.44–1.00)
GFR, Estimated: >60 mL/min (ref >60)
Glucose, Bld: 73 mg/dL (ref 70–99)
Potassium: 3.8 mmol/L (ref 3.5–5.1)
Sodium: 135 mmol/L (ref 135–145)

## 2024-06-01 LAB — CBC
HCT: 31 % — ABNORMAL LOW (ref 36.0–46.0)
Hemoglobin: 10.3 g/dL — ABNORMAL LOW (ref 12.0–15.0)
MCH: 32.3 pg (ref 26.0–34.0)
MCHC: 33.2 g/dL (ref 30.0–36.0)
MCV: 97.2 fL (ref 80.0–100.0)
Platelets: 166 K/uL (ref 150–400)
RBC: 3.19 MIL/uL — ABNORMAL LOW (ref 3.87–5.11)
RDW: 14.5 % (ref 11.5–15.5)
WBC: 9.9 K/uL (ref 4.0–10.5)
nRBC: 0 % (ref 0.0–0.2)

## 2024-06-01 MED ORDER — SODIUM CHLORIDE 0.9 % IV SOLN
2.0000 g | INTRAVENOUS | Status: DC
  Administered 2024-06-01: 2 g via INTRAVENOUS
  Filled 2024-06-01: qty 12.5

## 2024-06-01 MED ORDER — ARTIFICIAL TEARS OPHTHALMIC OINT
TOPICAL_OINTMENT | OPHTHALMIC | Status: DC | PRN
  Filled 2024-06-01: qty 3.5

## 2024-06-01 MED ORDER — ENSURE PLUS HIGH PROTEIN PO LIQD
237.0000 mL | ORAL | Status: DC
  Administered 2024-06-01 – 2024-06-02 (×2): 237 mL via ORAL

## 2024-06-01 MED ORDER — POLYETHYLENE GLYCOL 3350 17 G PO PACK
17.0000 g | PACK | Freq: Every day | ORAL | Status: DC
  Administered 2024-06-01: 17 g via ORAL
  Filled 2024-06-01 (×2): qty 1

## 2024-06-01 NOTE — Plan of Care (Signed)
  Problem: Education: Goal: Knowledge of General Education information will improve Description: Including pain rating scale, medication(s)/side effects and non-pharmacologic comfort measures 06/01/2024 0702 by Eliane Vola HERO, RN Outcome: Progressing 06/01/2024 0700 by Eliane Vola HERO, RN Outcome: Progressing   Problem: Health Behavior/Discharge Planning: Goal: Ability to manage health-related needs will improve 06/01/2024 0702 by Eliane Vola HERO, RN Outcome: Progressing 06/01/2024 0700 by Eliane Vola HERO, RN Outcome: Progressing   Problem: Clinical Measurements: Goal: Ability to maintain clinical measurements within normal limits will improve 06/01/2024 0702 by Eliane Vola HERO, RN Outcome: Progressing 06/01/2024 0700 by Eliane Vola HERO, RN Outcome: Progressing Goal: Will remain free from infection 06/01/2024 0702 by Eliane Vola HERO, RN Outcome: Progressing 06/01/2024 0700 by Eliane Vola HERO, RN Outcome: Progressing Goal: Diagnostic test results will improve 06/01/2024 0702 by Eliane Vola HERO, RN Outcome: Progressing 06/01/2024 0700 by Eliane Vola HERO, RN Outcome: Progressing Goal: Respiratory complications will improve 06/01/2024 0702 by Eliane Vola HERO, RN Outcome: Progressing 06/01/2024 0700 by Eliane Vola HERO, RN Outcome: Progressing Goal: Cardiovascular complication will be avoided 06/01/2024 9297 by Eliane Vola HERO, RN Outcome: Progressing 06/01/2024 0700 by Eliane Vola HERO, RN Outcome: Progressing   Problem: Activity: Goal: Risk for activity intolerance will decrease 06/01/2024 0702 by Eliane Vola HERO, RN Outcome: Progressing 06/01/2024 0700 by Eliane Vola HERO, RN Outcome: Progressing   Problem: Nutrition: Goal: Adequate nutrition will be maintained 06/01/2024 0702 by Eliane Vola HERO, RN Outcome: Progressing 06/01/2024 0700 by Eliane Vola HERO, RN Outcome: Progressing   Problem: Coping: Goal: Level of anxiety will  decrease 06/01/2024 0702 by Eliane Vola HERO, RN Outcome: Progressing 06/01/2024 0700 by Eliane Vola HERO, RN Outcome: Progressing   Problem: Elimination: Goal: Will not experience complications related to bowel motility 06/01/2024 0702 by Eliane Vola HERO, RN Outcome: Progressing 06/01/2024 0700 by Eliane Vola HERO, RN Outcome: Progressing Goal: Will not experience complications related to urinary retention 06/01/2024 0702 by Eliane Vola HERO, RN Outcome: Progressing 06/01/2024 0700 by Eliane Vola HERO, RN Outcome: Progressing   Problem: Pain Managment: Goal: General experience of comfort will improve and/or be controlled 06/01/2024 0702 by Eliane Vola HERO, RN Outcome: Progressing 06/01/2024 0700 by Eliane Vola HERO, RN Outcome: Progressing   Problem: Safety: Goal: Ability to remain free from injury will improve 06/01/2024 0702 by Eliane Vola HERO, RN Outcome: Progressing 06/01/2024 0700 by Eliane Vola HERO, RN Outcome: Progressing   Problem: Skin Integrity: Goal: Risk for impaired skin integrity will decrease 06/01/2024 0702 by Eliane Vola HERO, RN Outcome: Progressing 06/01/2024 0700 by Eliane Vola HERO, RN Outcome: Progressing

## 2024-06-01 NOTE — Plan of Care (Signed)

## 2024-06-01 NOTE — Progress Notes (Addendum)
 PROGRESS NOTE    Anna Mccoy  FMW:[id number] DOB: [birth year] DOA: 05/30/2024 PCP: Macarthur Elouise SQUIBB, DO   Brief Narrative: 89 year old with past medical history significant for CV AAA, rheumatoid heart hypothyroidism, hypertension, seizure disorder, heart failure preserved ejection fraction, recurrent UTI, chronic Foley catheter, SIADH presented with generalized weakness, hematuria.  Found to have sepsis secondary to UTI.   Assessment & Plan:   Principal Problem:   Acute cystitis   1-Sepsis secondary to UTI Chronic Foley catheter, POA Chronic urinary retention: - Patient presented with weakness, hematuria, tachypnea, leukocytosis white blood cell 20, lactic acid 3.2, UA with more than 50 white blood cells - Received IV fluids -Change IV ceftriaxone  to cefepime  to cover for Pseudomonas - Follow blood cultures: - Urine culture: Growing Pseudomonas and Citrobacter  Rheumatoid arthritis: On Orencia   Heart failure preserved ejection fraction: - Resume Coreg  increases  Sjogrens ; artificial tear, supportive care  Hypertension: - Continue amlodipine    Seizure: - Continue with Depakote  and primidone   Anemia; check iron  and B 12  SIADH: - Restriction - Monitor sodium, normal   Hypoalbuminemia/protein caloric malnutrition - Start Ensure  Deconditioning PT OT consulted   Estimated body mass index is 21.48 kg/m as calculated from the following:   Height as of 03/14/24: 5' (1.524 m).   Weight as of this encounter: 49.9 kg.   DVT prophylaxis: Lovenox  Code Status: Patient wishes to be DNR Family Communication: Daughter at bedside Disposition Plan:  Status is: Inpatient Remains inpatient appropriate because: Management of sepsis UTI    Consultants:  None  Procedures:  None  Antimicrobials:    Subjective: Patient alert and conversant, feeling better.  No new complaints.  Daughter at bedside  Objective: Vitals:   05/31/24 2112 05/31/24 2229 05/31/24  2341 06/01/24 0510  BP: (!) 152/76 (!) 141/73 107/60 129/71  Pulse: 74 70 65 70  Resp: 18  17 19   Temp: 97.9 F (36.6 C)  98.2 F (36.8 C) 97.9 F (36.6 C)  TempSrc: Oral  Oral Oral  SpO2: 100%  97% 97%  Weight:        Intake/Output Summary (Last 24 hours) at 06/01/2024 0805 Last data filed at 05/31/2024 1711 Gross per 24 hour  Intake 664.28 ml  Output 700 ml  Net -35.72 ml   Filed Weights   05/30/24 1821 05/31/24 1826  Weight: 49.9 kg 49.9 kg    Examination:  General exam: Appears calm and comfortable  Respiratory system: Clear to auscultation. Respiratory effort normal. Cardiovascular system: S1 & S2 heard, RRR.  Gastrointestinal system: Abdomen is nondistended, soft and nontender. No organomegaly or masses felt. Normal bowel sounds heard. Central nervous system: Alert and oriented. N Extremities: Symmetric 5 x 5 power.    Data Reviewed: I have personally reviewed following labs and imaging studies  CBC: Recent Labs  Lab 05/30/24 1843 05/31/24 0648 06/01/24 0456  WBC 20.1* 14.8* 9.9  NEUTROABS 15.5* 10.1*  --   HGB 12.1 11.6* 10.3*  HCT 36.9 36.2 31.0*  MCV 98.9 97.1 97.2  PLT 219 205 166   Basic Metabolic Panel: Recent Labs  Lab 05/30/24 1843 05/31/24 0648 06/01/24 0456  NA 136 134* 135  K 4.1 3.9 3.8  CL 99 98 100  CO2 26 26 26   GLUCOSE 109* 94 73  BUN 28* 17 13  CREATININE 0.80 0.63 0.68  CALCIUM  9.2 8.9 8.6*  MG  --  2.2  --    GFR: Estimated Creatinine Clearance: 28.9 mL/min (by C-G formula based  on SCr of 0.68 mg/dL). Liver Function Tests: Recent Labs  Lab 05/30/24 2032 05/31/24 0648  AST 32 29  ALT 18 14  ALKPHOS 76 61  BILITOT <0.2 0.4  PROT 6.7 6.1*  ALBUMIN 3.5 3.1*   No results for input(s): LIPASE, AMYLASE in the last 168 hours. No results for input(s): AMMONIA in the last 168 hours. Coagulation Profile: No results for input(s): INR, PROTIME in the last 168 hours. Cardiac Enzymes: No results for input(s):  CKTOTAL, CKMB, CKMBINDEX, TROPONINI in the last 168 hours. BNP (last 3 results) No results for input(s): PROBNP in the last 8760 hours. HbA1C: No results for input(s): HGBA1C in the last 72 hours. CBG: No results for input(s): GLUCAP in the last 168 hours. Lipid Profile: No results for input(s): CHOL, HDL, LDLCALC, TRIG, CHOLHDL, LDLDIRECT in the last 72 hours. Thyroid  Function Tests: No results for input(s): TSH, T4TOTAL, FREET4, T3FREE, THYROIDAB in the last 72 hours. Anemia Panel: No results for input(s): VITAMINB12, FOLATE, FERRITIN, TIBC, IRON , RETICCTPCT in the last 72 hours. Sepsis Labs: Recent Labs  Lab 05/30/24 2033 05/31/24 0053  LATICACIDVEN 3.2* 1.9    Recent Results (from the past 240 hours)  Blood culture (routine x 2)     Status: None (Preliminary result)   Collection Time: 05/30/24  8:15 PM   Specimen: BLOOD RIGHT FOREARM  Result Value Ref Range Status   Specimen Description   Final    BLOOD RIGHT FOREARM Performed at [facility], [address], [place], [place] [address]    Special Requests   Final    BOTTLES DRAWN AEROBIC AND ANAEROBIC Blood Culture results may not be optimal due to an inadequate volume of blood received in culture bottles Performed at [facility], [address], [place], [place] [address]    Culture   Final    NO GROWTH < 12 HOURS Performed at [facility], [address], [place], [place] [address]    Report Status PENDING  Incomplete  Blood culture (routine x 2)     Status: None (Preliminary result)   Collection Time: 05/31/24 12:43 PM   Specimen: BLOOD LEFT ARM  Result Value Ref Range Status   Specimen Description BLOOD LEFT ARM  Final   Special Requests   Final    Blood Culture results may not be optimal due to an inadequate volume of blood received in culture bottles BOTTLES DRAWN AEROBIC AND ANAEROBIC Performed at [facility], [address], [place], [place] [address]    Culture PENDING  Incomplete   Report Status PENDING  Incomplete         Radiology Studies: DG Chest Portable 1 View Result Date: 05/30/2024 EXAM: 1 VIEW(S) XRAY OF THE CHEST 05/30/2024 06:32:00 PM COMPARISON: 03/13/2024 CLINICAL HISTORY: low grade fever, RR 36. Low grade fever// Respiration rate of 36 - BIB [facility] from home for hematuria in catheter bag. Denies pain, sob, cough, fever, or NVD. Endorses hematuria in catheter bag, anorexia, no appetite, decreased intake and output, warm to touch, fatigue, ; malaise, not feeling self, feeling shaky and unwell. FINDINGS: LUNGS AND PLEURA: No focal pulmonary opacity. No pulmonary edema. No pleural effusion. No pneumothorax. HEART AND MEDIASTINUM: No acute abnormality of the cardiac and mediastinal silhouettes. BONES AND SOFT TISSUES: Thoracic spondylosis and levoscoliosis. Remainder of visualized skeletal structures unremarkable. IMPRESSION: 1. No acute findings. Electronically signed by: Norman Gatlin MD 05/30/2024 07:04 PM EDT RP Workstation: [id number]  Scheduled Meds:  amLODipine   5 mg Oral QHS   artificial tears  1 Application Both Eyes QHS   artificial tears  1 drop Both Eyes Q0600   aspirin  EC  81 mg Oral Daily   atorvastatin   20 mg Oral QPM   cholecalciferol  2,000 Units Oral Daily   divalproex   250 mg Oral Q12H   docusate sodium   200 mg Oral QHS   enoxaparin  (LOVENOX ) injection  30 mg Subcutaneous Daily   famotidine   20 mg Oral Daily   folic acid   1 mg Oral Daily   iron  polysaccharides  150 mg Oral Daily   levothyroxine   75 mcg Oral Q0600   primidone   50 mg Oral Daily   sodium chloride  flush  3 mL Intravenous Q12H   Continuous Infusions:  cefTRIAXone  (ROCEPHIN )  IV Stopped (05/31/24 1052)     LOS: 1 day    Time spent: 35 Minutes   Caytlyn Evers A Akshitha Culmer, MD [facility]   If 7PM-7AM, please contact night-coverage [email or web address]  06/01/2024, 8:05 AM

## 2024-06-01 NOTE — Progress Notes (Signed)
 Mobility Specialist - Progress Note   06/01/24 1358  Mobility  Activity Pivoted/transferred from chair to bed (Simultaneous filing. User may not have seen previous data.)  Level of Assistance Minimal assist, patient does 75% or more (Simultaneous filing. User may not have seen previous data.)  Assistive Device Front wheel walker  Range of Motion/Exercises Active  Activity Response Tolerated fair  Mobility Referral Yes  Mobility visit 1 Mobility  Mobility Specialist Start Time (ACUTE ONLY) 1340  Mobility Specialist Stop Time (ACUTE ONLY) 1351  Mobility Specialist Time Calculation (min) (ACUTE ONLY) 11 min   Received in chair requesting assistance back to bed. Min A for sit to stand and transfer. Mod A for bed mobility, left in bed with all needs met, staff in room.  Cyndee Ada Mobility Specialist

## 2024-06-01 NOTE — Evaluation (Signed)
 Physical Therapy Evaluation Patient Details Name: Anna Mccoy MRN: [id number] DOB: 19-Aug-1924 Today's Date: 06/01/2024  History of Present Illness  Pt is 89 yo female admitted on 05/30/24 with sepsis secondary to UTI.  Pt with hx including but not limited to CVA 2016, rheumatoid arthritis, hypothyroidism, hypertension, seizure disorder, heart failure preserved ejection fraction, UTI, chronic Foley catheter, SIADH  Clinical Impression  Pt admitted with above diagnosis. At baseline, pt resides with family with 24 hr care. She ambulates very little and has assist when she does.  Reports she can get to w/c with supervision. Today, pt requiring min A for bed mobility and pivot to chair.  She did fatigue easily and reports generalized pain from arthritis.  Pt is below baseline and will benefit from PT.  Recommend HHPT at d/c as long as family is able to provide min A for transfers.  Pt currently with functional limitations due to the deficits listed below (see PT Problem List). Pt will benefit from acute skilled PT to increase their independence and safety with mobility to allow discharge.           If plan is discharge home, recommend the following: Assistance with cooking/housework;A lot of help with walking and/or transfers;A lot of help with bathing/dressing/bathroom;Help with stairs or ramp for entrance   Can travel by private vehicle        Equipment Recommendations None recommended by PT  Recommendations for Other Services       Functional Status Assessment Patient has had a recent decline in their functional status and demonstrates the ability to make significant improvements in function in a reasonable and predictable amount of time.     Precautions / Restrictions Precautions Precautions: Fall      Mobility  Bed Mobility Overal bed mobility: Needs Assistance Bed Mobility: Supine to Sit     Supine to sit: Min assist, HOB elevated, Used rails     General bed mobility  comments: increased time    Transfers Overall transfer level: Needs assistance Equipment used: None Transfers: Sit to/from Stand, Bed to chair/wheelchair/BSC Sit to Stand: Min assist Stand pivot transfers: Min assist         General transfer comment: Pt standing to pivot to chair  by using bed rail, arm rest, and HHA.  Increased time and significant trunk flexion wtih min A to stand and stabilize.    Ambulation/Gait               General Gait Details: not able today  Stairs            Wheelchair Mobility     Tilt Bed    Modified Rankin (Stroke Patients Only)       Balance Overall balance assessment: Needs assistance Sitting-balance support: No upper extremity supported Sitting balance-Leahy Scale: Good     Standing balance support: Bilateral upper extremity supported Standing balance-Leahy Scale: Poor                               Pertinent Vitals/Pain Pain Assessment Pain Assessment: Faces Faces Pain Scale: Hurts a little bit Pain Location: generalized - arthritis Pain Descriptors / Indicators: Discomfort Pain Intervention(s): Limited activity within patient's tolerance, Monitored during session, Repositioned, Heat applied (pt with hot packs)    Home Living Family/patient expects to be discharged to:: Private residence Living Arrangements: Children (children live with her) Available Help at Discharge: Family;Available 24 hours/day;Personal care attendant (PCA M-F few  hours in morning) Type of Home: House Home Access: Ramped entrance       Home Layout: One level Home Equipment: Rollator (4 wheels);Rolling Walker (2 wheels);Transport chair;Shower seat Additional Comments: Reports aide assist with showers and breakfast in morning; states someone with her all times    Prior Function Prior Level of Function : Needs assist             Mobility Comments: Pt only ambulated if someone holding her and someone bringing chair while  she used RW, short distance ambulation.  Pt reports could get to w/c with supervision ADLs Comments: Has assist wtih adls and iadls     Extremity/Trunk Assessment   Upper Extremity Assessment Upper Extremity Assessment: Generalized weakness    Lower Extremity Assessment Lower Extremity Assessment: Generalized weakness    Cervical / Trunk Assessment Cervical / Trunk Assessment: Kyphotic  Communication   Communication Factors Affecting Communication: Hearing impaired    Cognition Arousal: Alert Behavior During Therapy: WFL for tasks assessed/performed   PT - Cognitive impairments: No family/caregiver present to determine baseline                       PT - Cognition Comments: increased time to respond, required repetition at times         Cueing       General Comments      Exercises     Assessment/Plan    PT Assessment Patient needs continued PT services  PT Problem List Decreased strength;Decreased mobility;Decreased range of motion;Decreased activity tolerance;Decreased balance;Decreased knowledge of use of DME       PT Treatment Interventions DME instruction;Therapeutic exercise;Gait training;Functional mobility training;Therapeutic activities;Patient/family education;Neuromuscular re-education;Balance training;Modalities    PT Goals (Current goals can be found in the Care Plan section)  Acute Rehab PT Goals Patient Stated Goal: return home PT Goal Formulation: With patient Time For Goal Achievement: 06/15/24 Potential to Achieve Goals: Good    Frequency Min 2X/week     Co-evaluation               AM-PAC PT 6 Clicks Mobility  Outcome Measure Help needed turning from your back to your side while in a flat bed without using bedrails?: A Little Help needed moving from lying on your back to sitting on the side of a flat bed without using bedrails?: A Little Help needed moving to and from a bed to a chair (including a wheelchair)?: A  Little Help needed standing up from a chair using your arms (e.g., wheelchair or bedside chair)?: A Little Help needed to walk in hospital room?: Total Help needed climbing 3-5 steps with a railing? : Total 6 Click Score: 14    End of Session Equipment Utilized During Treatment: Gait belt Activity Tolerance: Patient limited by fatigue Patient left: in chair;with call bell/phone within reach (no alarm in room - pt reports aware and will not get up on her own, placed gauze taped over RN call button so that pt can more easily find button due to low vision) Nurse Communication: Mobility status PT Visit Diagnosis: Muscle weakness (generalized) (M62.81);Other abnormalities of gait and mobility (R26.89)    Time: 8769-8746 PT Time Calculation (min) (ACUTE ONLY): 23 min   Charges:   PT Evaluation $PT Eval Low Complexity: 1 Low PT Treatments $Therapeutic Activity: 8-22 mins PT General Charges $$ ACUTE PT VISIT: 1 Visit         Benjiman, PT Acute Rehab [facility] [phone]  Rowan Blaker H Travarius Lange 06/01/2024, 1:30 PM

## 2024-06-02 ENCOUNTER — Emergency Department (HOSPITAL_COMMUNITY)
Admission: EM | Admit: 2024-06-02 | Discharge: 2024-06-03 | Disposition: A | Discharge status: Home / Self Care | Attending: Emergency Medicine | Admitting: Emergency Medicine

## 2024-06-02 DIAGNOSIS — Z79899 Other long term (current) drug therapy: Secondary | ICD-10-CM | POA: Diagnosis not present

## 2024-06-02 DIAGNOSIS — I10 Essential (primary) hypertension: Secondary | ICD-10-CM | POA: Insufficient documentation

## 2024-06-02 DIAGNOSIS — R519 Headache, unspecified: Secondary | ICD-10-CM | POA: Diagnosis present

## 2024-06-02 DIAGNOSIS — R030 Elevated blood-pressure reading, without diagnosis of hypertension: Secondary | ICD-10-CM

## 2024-06-02 DIAGNOSIS — E039 Hypothyroidism, unspecified: Secondary | ICD-10-CM | POA: Diagnosis not present

## 2024-06-02 DIAGNOSIS — Z7982 Long term (current) use of aspirin: Secondary | ICD-10-CM | POA: Insufficient documentation

## 2024-06-02 DIAGNOSIS — N390 Urinary tract infection, site not specified: Secondary | ICD-10-CM | POA: Diagnosis not present

## 2024-06-02 LAB — URINE CULTURE: Culture: >=100000 — AB

## 2024-06-02 LAB — IRON AND TIBC
Iron: 39 ug/dL (ref 28–170)
Saturation Ratios: 20 % (ref 10.4–31.8)
TIBC: 199 ug/dL — ABNORMAL LOW (ref 250–450)
UIBC: 160 ug/dL

## 2024-06-02 LAB — VITAMIN B12: Vitamin B-12: 1278 pg/mL — ABNORMAL HIGH (ref 180–914)

## 2024-06-02 LAB — FERRITIN: Ferritin: 156 ng/mL (ref 11–307)

## 2024-06-02 MED ORDER — CIPROFLOXACIN HCL 500 MG PO TABS: 500.0000 mg | ORAL_TABLET | Freq: Every day | ORAL | 0 refills | Status: AC

## 2024-06-02 MED ORDER — AMLODIPINE BESYLATE 5 MG PO TABS
5.0000 mg | ORAL_TABLET | Freq: Once | ORAL | Status: AC
  Administered 2024-06-02: 5 mg via ORAL
  Filled 2024-06-02: qty 1

## 2024-06-02 MED ORDER — CIPROFLOXACIN HCL 500 MG PO TABS
500.0000 mg | ORAL_TABLET | Freq: Every day | ORAL | Status: DC
  Administered 2024-06-02: 500 mg via ORAL
  Filled 2024-06-02: qty 1

## 2024-06-02 MED ORDER — ACETAMINOPHEN 325 MG PO TABS
650.0000 mg | ORAL_TABLET | Freq: Once | ORAL | Status: AC
  Administered 2024-06-02: 650 mg via ORAL
  Filled 2024-06-02: qty 2

## 2024-06-02 NOTE — Plan of Care (Signed)

## 2024-06-02 NOTE — ED Triage Notes (Signed)
 Pt brought by EMS, As per ems family called them because they are worried about her elevated blood pressure, initial blood pressure was 170/110 taken by the ems. Pt recently got DC from [place] this morning because of UTI. With foley catheter in placed. Alert x4 :afebrile.

## 2024-06-02 NOTE — Progress Notes (Signed)
 IV removed, dressed, belongings packed, instructions reviewed with family, understanding verbalized, transferred to front entrance via wheelchair.

## 2024-06-02 NOTE — TOC Initial Note (Signed)
 Transition of Care [facility]) - Initial/Assessment Note    Patient Details  Name: Anna Mccoy MRN: [id number] Date of Birth: [birth year]  Transition [facility]) CM/SW Contact:    Doneta Glenys DASEN, RN Phone Number: 06/02/2024, 11:01 AM  Clinical Narrative:                 Presented for hematuria. CM introduced and explained role to patient and [name] (dlt) in room.PTA lives in a house with adult daughter;verified PCP/insurance and encourage schedule appt for hospital discharge follow-up;PT recommended [facility] PT/OT and patient choice is [facility] since prior client. Patient has requested PTAR for transport.  Referral sent in HUB for [facility].  Expected Discharge Plan: Home w Home Health Services Barriers to Discharge: Barriers Resolved   Patient Goals and CMS Choice Patient states their goals for this hospitalization and ongoing recovery are:: Home CMS Medicare.gov Compare Post Acute Care list provided to:: Patient Represenative (must comment) [name]) Choice offered to / list presented to : Adult Children [place] ownership interest in [facility].provided to:: Parent NA    Expected Discharge Plan and Services In-house Referral: NA Discharge Planning Services: CM Consult Post Acute Care Choice: Home Health Living arrangements for the past 2 months: Single Family Home Expected Discharge Date: 06/02/24               DME Arranged: N/A DME Agency: NA       HH Arranged: PT HH Agency: [facility] Date HH Agency Contacted: 06/02/24      Prior Living Arrangements/Services Living arrangements for the past 2 months: Single Family Home Lives with:: Adult Children Patient language and need for interpreter reviewed:: Yes Do you feel safe going back to the place where you live?: Yes      Need for Family Participation in Patient Care: Yes (Comment) Care giver support system in place?: Yes (comment) Current home services: DME, Homehealth aide, Home  PT (Rollator, BSC, shower chair; HHPT w/ [facility]; aide w/ [facility]) Criminal Activity/Legal Involvement Pertinent to Current Situation/Hospitalization: No - Comment as needed  Activities of Daily Living   ADL Screening (condition at time of admission) Independently performs ADLs?: No Does the patient have a NEW difficulty with bathing/dressing/toileting/self-feeding that is expected to last >3 days?: No Does the patient have a NEW difficulty with getting in/out of bed, walking, or climbing stairs that is expected to last >3 days?: No Does the patient have a NEW difficulty with communication that is expected to last >3 days?: No Is the patient deaf or have difficulty hearing?: Yes Does the patient have difficulty seeing, even when wearing glasses/contacts?: Yes Does the patient have difficulty concentrating, remembering, or making decisions?: Yes  Permission Sought/Granted Permission sought to share information with : Case Manager Permission granted to share information with : Yes, Verbal Permission Granted  Share Information with NAME: Potts,[name] (Daughter)  [phone]  Permission granted to share info w AGENCY: [facility]        Emotional Assessment Appearance:: Appears stated age Attitude/Demeanor/Rapport: Engaged Affect (typically observed): Appropriate Orientation: : Oriented to Self, Oriented to Place, Oriented to  Time, Oriented to Situation Alcohol  / Substance Use: Not Applicable Psych Involvement: No (comment)  Admission diagnosis:  Acute cystitis [N30.00] Lower urinary tract infectious disease [N39.0] Acute cystitis with hematuria [N30.01] Patient Active Problem List   Diagnosis Date Noted   Acute cystitis 05/31/2024   Protein-calorie malnutrition, severe 03/15/2024   Severe sepsis (HCC) 03/14/2024  COVID-19 03/14/2024   Acute encephalopathy 03/13/2024   COVID-19 virus infection 03/13/2024   Urinary retention 03/13/2024   Chronic indwelling Foley  catheter 03/11/2022   SIADH (syndrome of inappropriate ADH production) 01/24/2022   Diverticulosis of colon 03/26/2021   Lumbar spinal stenosis 01/20/2021   Generalized osteoarthritis 01/20/2021   Pure hypercholesterolemia 06/01/2019   Rectal bleeding 02/13/2018    Class: History of   Dysfunction of right eustachian tube 02/06/2018   Presbycusis of both ears 02/06/2018   Sudden idiopathic hearing loss of right ear 01/18/2018   Bilateral impacted cerumen 01/10/2018   Hospital discharge follow-up 12/02/2017   Other fatigue    Syncope 11/10/2017   Encounter for Medicare annual wellness exam 10/08/2017   Abnormal urinalysis 08/22/2017   Gait disturbance 06/06/2017   Frailty 10/07/2016   Kyphosis (acquired) (postural) 10/07/2016   Bilateral leg edema 01/13/2016   Hypertensive heart disease with heart failure (HCC) 01/13/2016   Chronic diastolic heart failure (HCC) 01/13/2016   Blurred vision, bilateral 12/31/2015   Bilateral dry eyes 12/31/2015   Chronic fatigue 12/15/2015   Chronic diarrhea of unknown origin 12/04/2015   Essential tremor 09/29/2015   Aphthous ulcer 09/28/2015   Cardiac arrhythmia 09/28/2015   Cataract 09/28/2015   Cholesterol retinal embolus of both eyes 09/28/2015   History of headache 09/28/2015   Hypertrophic toenail 09/28/2015   Hypokalemia 09/28/2015   Osteoporosis 09/28/2015   Peripheral neuropathy 09/28/2015   Seborrheic dermatitis 09/28/2015   Situational anxiety 09/28/2015   Vitamin D  deficiency 09/28/2015   Weight loss 09/28/2015   History of stroke 09/28/2015   Lower urinary tract infectious disease    Seizure cerebral (HCC)    Cerebral infarction (HCC) 02/02/2015   History of Stroke in 2016 without residual deficits (HCC) 02/02/2015   Rheumatoid arthritis (HCC) 02/02/2015   Headache 02/02/2015   Hypertension 02/02/2015   Hypothyroidism 02/02/2015   Cerebral infarction (HCC)    Uterovaginal prolapse, complete 04/18/2013   Complete  uterovaginal prolapse 04/18/2013   PCP:  Macarthur Elouise SQUIBB, DO Pharmacy:   [facility] [phone] - [place], [place] - [address] [place] [address] Phone: [phone] Fax: [phone]     Social Drivers of Health (SDOH) Social History: SDOH Screenings   Food Insecurity: No Food Insecurity (05/31/2024)  Housing: Low Risk  (05/31/2024)  Transportation Needs: No Transportation Needs (05/31/2024)  Utilities: Not At Risk (05/31/2024)  Financial Resource Strain: Low Risk  (03/08/2020)   Received from [facility] visits prior to 10/14/2022.  Physical Activity: Insufficiently Active (03/08/2020)   Received from [facility] visits prior to 10/14/2022.  Social Connections: Moderately Isolated (05/31/2024)  Stress: No Stress Concern Present (03/08/2020)   Received from [facility] visits prior to 10/14/2022.  Tobacco Use: Low Risk  (05/30/2024)   SDOH Interventions:     Readmission Risk Interventions    06/02/2024   10:51 AM 03/16/2024   11:20 AM  Readmission Risk Prevention Plan  Transportation Screening Complete Complete  PCP or Specialist Appt within 5-7 Days  Complete  PCP or Specialist Appt within 3-5 Days Complete   Home Care Screening  Complete  Medication Review (RN CM)  Complete  HRI or Home Care Consult Complete   Social Work Consult for Recovery Care Planning/Counseling Complete   Palliative Care Screening Not Applicable   Medication Review Oceanographer) Complete

## 2024-06-02 NOTE — ED Provider Notes (Signed)
 [place] EMERGENCY DEPARTMENT AT [facility] Provider Note   CSN: [id number] Arrival date & time: 06/02/24  2249     Patient presents with: Hypertension (uti)   Anna Mccoy is a 89 y.o. female.  {Add pertinent medical, surgical, social history, OB history to YEP:67052}  medical history significant for CV AAA, rheumatoid heart hypothyroidism, hypertension, seizure disorder, heart failure preserved ejection fraction, recurrent UTI, chronic Foley catheter, SIADH discharge from hospital today with UTI.  Family concerned about elevated blood pressure.  Unclear why blood pressure was taken but blood pressure was 170/110 by EMS.  She complains of gradual onset headache that onset and route.  No thunderclap onset.  No chest pain or shortness of breath.  Reports recently discharged in the hospital this morning with UTI and discharged on Cipro.  Reports compliance with her medications.  She denies chest pain, shortness of breath, nausea, vomiting, abdominal pain.  No focal weakness, nausea, vomiting, vision changes.  She reports compliance with her amlodipine  and did not take it tonight.  Denies chest pain or shortness of breath.  Denies vision changes.  No focal weakness, numbness or tingling.  The history is provided by the EMS personnel and the patient.  Hypertension Associated symptoms include headaches. Pertinent negatives include no chest pain, no abdominal pain and no shortness of breath.       Prior to Admission medications   Medication Sig Start Date End Date Taking? Authorizing Provider  Abatacept  (ORENCIA  [place]) Inject 1 Syringe into the skin every 30 (thirty) days.    [provider]  acetaminophen  (TYLENOL ) 500 MG tablet Take 500 mg by mouth at bedtime.    [provider]  amLODipine  (NORVASC ) 5 MG tablet Take 5 mg by mouth at bedtime.    [provider]  aspirin  81 MG tablet Take 81 mg by mouth daily.     [provider]   atorvastatin  (LIPITOR) 20 MG tablet Take 20 mg by mouth every evening.     [provider]  carvedilol  (COREG ) 6.25 MG tablet Take 6.25 mg by mouth 2 (two) times daily. 11/18/21   [provider]  Cholecalciferol (VITAMIN D ) 50 MCG (2000 UT) tablet Take 2,000 Units by mouth daily.    [provider]  ciprofloxacin (CIPRO) 500 MG tablet Take 1 tablet (500 mg total) by mouth daily with breakfast for 6 days. 06/03/24 06/09/24  Regalado, Belkys A, MD  Dextran 70-Hypromellose 0.1-0.3 % SOLN Apply 1 drop to eye daily at 6 (six) AM. 09/10/18   [provider]  divalproex  (DEPAKOTE ) 250 MG DR tablet Take 1 tablet (250 mg total) by mouth every 12 (twelve) hours. 02/05/15   Samtani, Jai-Gurmukh, MD  Docusate Sodium  (COLACE PO) Take 2 tablets by mouth at bedtime.    [provider]  famotidine  (PEPCID ) 20 MG tablet Take 20 mg by mouth daily. 09/25/23   [provider]  fluticasone (FLONASE) 50 MCG/ACT nasal spray Place 1 spray into the nose. 01/16/24   [provider]  folic acid  (FOLVITE ) 1 MG tablet Take 1 mg by mouth daily.    [provider]  furosemide  (LASIX ) 20 MG tablet Take 20 mg by mouth every Monday, Wednesday, and Friday. 10/20/16   [provider]  hypromellose (GENTEAL) 0.3 % GEL ophthalmic ointment Place 1 application  into both eyes See admin instructions. Gel - Every day except the days using Tobradex Patient not taking: Reported on 05/31/2024    [provider]  hypromellose (GENTEAL) 0.3 % GEL ophthalmic ointment Place 1 Application into both eyes at bedtime.    [provider]  levothyroxine  (SYNTHROID ) 75 MCG tablet Take 75 mcg by mouth daily before breakfast.    [provider]  Multiple Vitamins-Minerals (PRESERVISION AREDS PO) Take 1 capsule by mouth 2 (two) times daily.    [provider]  olmesartan  (BENICAR ) 40 MG tablet Take 40 mg by mouth daily. 11/18/21   [provider]  POLY-IRON  150 150 MG capsule Take 150 mg by mouth daily.  10/13/15   [provider]  primidone  (MYSOLINE ) 50 MG tablet Take 50 mg by mouth daily. 09/20/16   [provider]  Probiotic Product (PROBIOTIC PO) Take 1 capsule by mouth daily.    [provider]  TOBRADEX ophthalmic ointment Place 1 Application into both eyes See admin instructions. Once every 3 days PRN 11/22/21   [provider]    Allergies: Ace inhibitors, Bacitracin-polymyxin b, Ciprofloxacin, Lidocaine, Neosporin [neomycin-bacitracin zn-polymyx], and Sulfa antibiotics    Review of Systems  Constitutional:  Negative for activity change, appetite change and fever.  HENT:  Negative for congestion.   Respiratory:  Negative for cough, chest tightness and shortness of breath.   Cardiovascular:  Negative for chest pain.  Gastrointestinal:  Negative for abdominal pain, nausea and vomiting.  Genitourinary:  Negative for dysuria.  Musculoskeletal:  Negative for arthralgias and myalgias.  Skin:  Negative for rash.  Neurological:  Positive for headaches. Negative for dizziness, weakness and light-headedness.   all other systems are negative except as noted in the HPI and PMH.     Updated Vital Signs BP (!) 176/113   Pulse 69   Temp 98.6 F (37 C)   Resp (!) 23   LMP 08/14/1981 (Approximate)   SpO2 96%   Physical Exam Vitals and nursing note reviewed.  Constitutional:      General: She is not in acute distress.    Appearance: She is well-developed.  HENT:     Head: Normocephalic and atraumatic.     Mouth/Throat:     Pharynx: No oropharyngeal exudate.  Eyes:     Conjunctiva/sclera: Conjunctivae normal.     Pupils: Pupils are equal, round, and reactive to light.     Comments: Bilateral conjunctivitis  Neck:     Comments: No meningismus. Cardiovascular:     Rate and Rhythm: Normal rate and regular rhythm.     Heart sounds: Normal heart sounds. No murmur  heard. Pulmonary:     Effort: Pulmonary effort is normal. No respiratory distress.     Breath sounds: Normal breath sounds.  Abdominal:     Palpations: Abdomen is soft.     Tenderness: There is no abdominal tenderness. There is no guarding or rebound.  Musculoskeletal:        General: No tenderness. Normal range of motion.     Cervical back: Normal range of motion and neck supple.  Skin:    General: Skin is warm.  Neurological:     Mental Status: She is alert and oriented to person, place, and time.     Cranial Nerves: No cranial nerve deficit.     Motor: No abnormal muscle tone.     Coordination: Coordination normal.     Comments:  5/5 strength throughout. CN 2-12 intact.Equal grip strength.   Psychiatric:        Behavior: Behavior normal.     (all labs ordered are listed, but only abnormal results are displayed)  Labs Reviewed  CBC WITH DIFFERENTIAL/PLATELET  COMPREHENSIVE METABOLIC PANEL WITH GFR  URINALYSIS, ROUTINE W REFLEX MICROSCOPIC  VALPROIC  ACID LEVEL  TROPONIN T, HIGH SENSITIVITY    EKG: None  Radiology: No results found.  {Document cardiac monitor, telemetry assessment procedure when appropriate:32947} Procedures   Medications Ordered in the ED  acetaminophen  (TYLENOL ) tablet 650 mg (has no administration in time range)  amLODipine  (NORVASC ) tablet 5 mg (has no administration in time range)      {Click here for ABCD2, HEART and other calculators REFRESH Note before signing:1}                              Medical Decision Making Amount and/or Complexity of Data Reviewed Independent Historian: EMS Labs: ordered. Decision-making details documented in ED Course. Radiology: ordered and independent interpretation performed. Decision-making details documented in ED Course. ECG/medicine tests: ordered and independent interpretation performed. Decision-making details documented in ED Course.  Risk OTC drugs. Prescription drug management.   Patient  discharged from the hospital today with elevated blood pressure and complicated UTI she is on Cipro.  EMS apparently was called by family due to elevated blood pressure.  She complains of gradual onset headache with onset and route.  No chest pain or shortness of breath.  Does state compliance with her blood pressure medications.  {Document critical care time when appropriate  Document review of labs and clinical decision tools ie CHADS2VASC2, etc  Document your independent review of radiology images and any outside records  Document your discussion with family members, caretakers and with consultants  Document social determinants of health affecting pt's care  Document your decision making why or why not admission, treatments were needed:32947:::1}   Final diagnoses:  None    ED Discharge Orders     None

## 2024-06-02 NOTE — Plan of Care (Signed)
  Problem: Education: Goal: Knowledge of General Education information will improve Description: Including pain rating scale, medication(s)/side effects and non-pharmacologic comfort measures 06/02/2024 1127 by Alaina Dozier PARAS, RN Outcome: Adequate for Discharge 06/02/2024 [phone] by Alaina Dozier PARAS, RN Outcome: Progressing   Problem: Health Behavior/Discharge Planning: Goal: Ability to manage health-related needs will improve 06/02/2024 1127 by Alaina Dozier PARAS, RN Outcome: Adequate for Discharge 06/02/2024 0834 by Alaina Dozier PARAS, RN Outcome: Progressing   Problem: Clinical Measurements: Goal: Ability to maintain clinical measurements within normal limits will improve 06/02/2024 1127 by Alaina Dozier PARAS, RN Outcome: Adequate for Discharge 06/02/2024 0834 by Alaina Dozier PARAS, RN Outcome: Progressing Goal: Will remain free from infection 06/02/2024 1127 by Alaina Dozier PARAS, RN Outcome: Adequate for Discharge 06/02/2024 [phone] by Alaina Dozier PARAS, RN Outcome: Progressing Goal: Diagnostic test results will improve 06/02/2024 1127 by Alaina Dozier PARAS, RN Outcome: Adequate for Discharge 06/02/[phone] by Alaina Dozier PARAS, RN Outcome: Progressing Goal: Respiratory complications will improve Outcome: Adequate for Discharge Goal: Cardiovascular complication will be avoided Outcome: Adequate for Discharge   Problem: Activity: Goal: Risk for activity intolerance will decrease Outcome: Adequate for Discharge   Problem: Nutrition: Goal: Adequate nutrition will be maintained Outcome: Adequate for Discharge   Problem: Coping: Goal: Level of anxiety will decrease Outcome: Adequate for Discharge   Problem: Elimination: Goal: Will not experience complications related to bowel motility Outcome: Adequate for Discharge Goal: Will not experience complications related to urinary retention Outcome: Adequate for Discharge   Problem: Pain  Managment: Goal: General experience of comfort will improve and/or be controlled Outcome: Adequate for Discharge   Problem: Safety: Goal: Ability to remain free from injury will improve Outcome: Adequate for Discharge   Problem: Skin Integrity: Goal: Risk for impaired skin integrity will decrease Outcome: Adequate for Discharge

## 2024-06-02 NOTE — Plan of Care (Signed)

## 2024-06-02 NOTE — Discharge Summary (Signed)
 Physician Discharge Summary   Patient: Anna Mccoy MRN: [id number] DOB: March 05, 1925  Admit date:     05/30/2024  Discharge date: 06/02/24  Discharge Physician: Owen DELENA Lore   PCP: Lazoff, Shawn P, DO   Recommendations at discharge:   Follow up resolution of UTI  Discharge Diagnoses: Principal Problem:   Acute cystitis  Resolved Problems:   * No resolved hospital problems. [facility] Course: 89 year old with past medical history significant for CV AAA, rheumatoid heart hypothyroidism, hypertension, seizure disorder, heart failure preserved ejection fraction, recurrent UTI, chronic Foley catheter, SIADH presented with generalized weakness, hematuria.  Found to have sepsis secondary to UTI.    Assessment and Plan: 1-Sepsis secondary to UTI Chronic Foley catheter, POA Chronic urinary retention: - Patient presented with weakness, hematuria, tachypnea, leukocytosis white blood cell 20, lactic acid 3.2, UA with more than 50 white blood cells - Received IV fluids -Change IV ceftriaxone  to cefepime  to cover for Pseudomonas - Follow blood cultures: no growth to date - Urine culture: Growing Pseudomonas and Citrobacter Discharge on cipro for 6 days to complete 7 day treatment.   Rheumatoid arthritis: On Orencia    Heart failure preserved ejection fraction: - Resume Coreg  increases   Sjogrens ; artificial tear, supportive care   Hypertension: - Continue amlodipine , resume rest BP med      Seizure: - Continue with Depakote  and primidone    Anemia;  iron  and B 12 not low.  Resume home iron  supplement.   SIADH: - Restriction - Monitor sodium, normal     Hypoalbuminemia/protein caloric malnutrition - Start Ensure   Deconditioning PT OT consulted         Consultants: none Procedures performed: none Disposition: Home Diet recommendation:  Discharge Diet Orders (From admission, onward)     Start     Ordered   06/02/24 0000  Diet - low sodium heart  healthy        06/02/24 1015           Regular diet DISCHARGE MEDICATION: Allergies as of 06/02/2024       Reactions   Ace Inhibitors Cough   Bacitracin-polymyxin B    Other reaction(s): Unknown   Ciprofloxacin Diarrhea   Lidocaine Other (See Comments)   unknown   Neosporin [neomycin-bacitracin Zn-polymyx] Other (See Comments)   unknown   Sulfa Antibiotics Other (See Comments)   unknown        Medication List     STOP taking these medications    oxybutynin  5 MG tablet Commonly known as: DITROPAN        TAKE these medications    acetaminophen  500 MG tablet Commonly known as: TYLENOL  Take 500 mg by mouth at bedtime.   amLODipine  5 MG tablet Commonly known as: NORVASC  Take 5 mg by mouth at bedtime.   aspirin  81 MG tablet Take 81 mg by mouth daily.   atorvastatin  20 MG tablet Commonly known as: LIPITOR Take 20 mg by mouth every evening.   carvedilol  6.25 MG tablet Commonly known as: COREG  Take 6.25 mg by mouth 2 (two) times daily.   ciprofloxacin 500 MG tablet Commonly known as: CIPRO Take 1 tablet (500 mg total) by mouth daily with breakfast for 6 days. Start taking on: June 03, 2024   COLACE PO Take 2 tablets by mouth at bedtime.   Dextran 70-Hypromellose 0.1-0.3 % Soln Apply 1 drop to eye daily at 6 (six) AM.   divalproex  250 MG DR tablet Commonly known as: DEPAKOTE  Take 1 tablet (250  mg total) by mouth every 12 (twelve) hours.   famotidine  20 MG tablet Commonly known as: PEPCID  Take 20 mg by mouth daily.   fluticasone 50 MCG/ACT nasal spray Commonly known as: FLONASE Place 1 spray into the nose.   folic acid  1 MG tablet Commonly known as: FOLVITE  Take 1 mg by mouth daily.   furosemide  20 MG tablet Commonly known as: LASIX  Take 20 mg by mouth every Monday, Wednesday, and Friday.   hypromellose 0.3 % Gel ophthalmic ointment Commonly known as: GENTEAL Place 1 application  into both eyes See admin instructions. Gel - Every day  except the days using Tobradex   hypromellose 0.3 % Gel ophthalmic ointment Commonly known as: GENTEAL Place 1 Application into both eyes at bedtime.   levothyroxine  75 MCG tablet Commonly known as: SYNTHROID  Take 75 mcg by mouth daily before breakfast.   olmesartan  40 MG tablet Commonly known as: BENICAR  Take 40 mg by mouth daily.   ORENCIA  [place] Inject 1 Syringe into the skin every 30 (thirty) days.   Poly-Iron  150 150 MG capsule Generic drug: iron  polysaccharides Take 150 mg by mouth daily.   PRESERVISION AREDS PO Take 1 capsule by mouth 2 (two) times daily.   primidone  50 MG tablet Commonly known as: MYSOLINE  Take 50 mg by mouth daily.   PROBIOTIC PO Take 1 capsule by mouth daily.   TobraDex ophthalmic ointment Generic drug: tobramycin-dexamethasone Place 1 Application into both eyes See admin instructions. Once every 3 days PRN   Vitamin D  50 MCG (2000 UT) tablet Take 2,000 Units by mouth daily.        Follow-up Information     Lazoff, Shawn P, DO Follow up in 1 week(s).   Specialty: Family Medicine Contact information: [address] [place] [address] [phone]         [facility] Follow up.   Why: Telephone [phone] [facility] will call to set up appointment for visit.               Discharge Exam: Filed Weights   05/30/24 1821 05/31/24 1826  Weight: 49.9 kg 49.9 kg   General; NAD  Condition at discharge: stable  The results of significant diagnostics from this hospitalization (including imaging, microbiology, ancillary and laboratory) are listed below for reference.   Imaging Studies: DG Chest Portable 1 View Result Date: 05/30/2024 EXAM: 1 VIEW(S) XRAY OF THE CHEST 05/30/2024 06:32:00 PM COMPARISON: 03/13/2024 CLINICAL HISTORY: low grade fever, RR 36. Low grade fever// Respiration rate of 36 - BIB [facility] from home for hematuria in catheter bag. Denies pain, sob, cough, fever, or NVD. Endorses hematuria in  catheter bag, anorexia, no appetite, decreased intake and output, warm to touch, fatigue, ; malaise, not feeling self, feeling shaky and unwell. FINDINGS: LUNGS AND PLEURA: No focal pulmonary opacity. No pulmonary edema. No pleural effusion. No pneumothorax. HEART AND MEDIASTINUM: No acute abnormality of the cardiac and mediastinal silhouettes. BONES AND SOFT TISSUES: Thoracic spondylosis and levoscoliosis. Remainder of visualized skeletal structures unremarkable. IMPRESSION: 1. No acute findings. Electronically signed by: Norman Gatlin MD 05/30/2024 07:04 PM EDT RP Workstation: [id number]    Microbiology: Results for orders placed or performed during the hospital encounter of 05/30/24  Blood culture (routine x 2)     Status: None (Preliminary result)   Collection Time: 05/30/24  8:15 PM   Specimen: BLOOD RIGHT FOREARM  Result Value Ref Range Status   Specimen Description   Final    BLOOD  RIGHT FOREARM Performed at [facility], [address], [place], [place] [address]    Special Requests   Final    BOTTLES DRAWN AEROBIC AND ANAEROBIC Blood Culture results may not be optimal due to an inadequate volume of blood received in culture bottles Performed at [facility], [address], [place], [place] [address]    Culture   Final    NO GROWTH 3 DAYS Performed at [facility], [address], [place], [place] [address]    Report Status PENDING  Incomplete  Urine Culture     Status: Abnormal   Collection Time: 05/30/24 10:31 PM   Specimen: Urine, Catheterized  Result Value Ref Range Status   Specimen Description   Final    URINE, CATHETERIZED Performed at [facility], [address] [place], [place] [address]    Special Requests   Final    NONE Performed at [facility], [address], [place], [place] [address]    Culture (A)  Final    >=100,000 COLONIES/mL CITROBACTER FREUNDII 80,000 COLONIES/mL PSEUDOMONAS  AERUGINOSA    Report Status 06/02/2024 FINAL  Final   Organism ID, Bacteria CITROBACTER FREUNDII (A)  Final   Organism ID, Bacteria PSEUDOMONAS AERUGINOSA (A)  Final      Susceptibility   Citrobacter freundii - MIC*    CEFEPIME  <=0.12 SENSITIVE Sensitive     ERTAPENEM <=0.12 SENSITIVE Sensitive     CEFTRIAXONE  <=0.25 SENSITIVE Sensitive     CIPROFLOXACIN <=0.06 SENSITIVE Sensitive     GENTAMICIN <=1 SENSITIVE Sensitive     NITROFURANTOIN  <=16 SENSITIVE Sensitive     TRIMETH/SULFA <=20 SENSITIVE Sensitive     PIP/TAZO Value in next row Sensitive      <=4 SENSITIVEThis is a modified FDA-approved test that has been validated and its performance characteristics determined by the reporting laboratory.  This laboratory is certified under the Clinical Laboratory Improvement Amendments CLIA as qualified to perform high complexity clinical laboratory testing.    MEROPENEM Value in next row Sensitive      <=4 SENSITIVEThis is a modified FDA-approved test that has been validated and its performance characteristics determined by the reporting laboratory.  This laboratory is certified under the Clinical Laboratory Improvement Amendments CLIA as qualified to perform high complexity clinical laboratory testing.    * >=100,000 COLONIES/mL CITROBACTER FREUNDII   Pseudomonas aeruginosa - MIC*    MEROPENEM Value in next row Intermediate      <=4 SENSITIVEThis is a modified FDA-approved test that has been validated and its performance characteristics determined by the reporting laboratory.  This laboratory is certified under the Clinical Laboratory Improvement Amendments CLIA as qualified to perform high complexity clinical laboratory testing.    CIPROFLOXACIN Value in next row Sensitive      <=4 SENSITIVEThis is a modified FDA-approved test that has been validated and its performance characteristics determined by the reporting laboratory.  This laboratory is certified under the Clinical Laboratory Improvement  Amendments CLIA as qualified to perform high complexity clinical laboratory testing.    IMIPENEM Value in next row Resistant      <=4 SENSITIVEThis is a modified FDA-approved test that has been validated and its performance characteristics determined by the reporting laboratory.  This laboratory is certified under the Clinical Laboratory Improvement Amendments CLIA as qualified to perform high complexity clinical laboratory testing.    PIP/TAZO Value in next row Sensitive      8 SENSITIVEThis is a modified FDA-approved  test that has been validated and its performance characteristics determined by the reporting laboratory.  This laboratory is certified under the Clinical Laboratory Improvement Amendments CLIA as qualified to perform high complexity clinical laboratory testing.    CEFEPIME  Value in next row Sensitive      8 SENSITIVEThis is a modified FDA-approved test that has been validated and its performance characteristics determined by the reporting laboratory.  This laboratory is certified under the Clinical Laboratory Improvement Amendments CLIA as qualified to perform high complexity clinical laboratory testing.    CEFTAZIDIME/AVIBACTAM Value in next row Sensitive      8 SENSITIVEThis is a modified FDA-approved test that has been validated and its performance characteristics determined by the reporting laboratory.  This laboratory is certified under the Clinical Laboratory Improvement Amendments CLIA as qualified to perform high complexity clinical laboratory testing.    CEFTOLOZANE/TAZOBACTAM Value in next row Sensitive      8 SENSITIVEThis is a modified FDA-approved test that has been validated and its performance characteristics determined by the reporting laboratory.  This laboratory is certified under the Clinical Laboratory Improvement Amendments CLIA as qualified to perform high complexity clinical laboratory testing.    TOBRAMYCIN Value in next row Sensitive      8 SENSITIVEThis is a  modified FDA-approved test that has been validated and its performance characteristics determined by the reporting laboratory.  This laboratory is certified under the Clinical Laboratory Improvement Amendments CLIA as qualified to perform high complexity clinical laboratory testing.    CEFTAZIDIME Value in next row Sensitive      8 SENSITIVEThis is a modified FDA-approved test that has been validated and its performance characteristics determined by the reporting laboratory.  This laboratory is certified under the Clinical Laboratory Improvement Amendments CLIA as qualified to perform high complexity clinical laboratory testing.    * 80,000 COLONIES/mL PSEUDOMONAS AERUGINOSA  Blood culture (routine x 2)     Status: None (Preliminary result)   Collection Time: 05/31/24 12:43 PM   Specimen: BLOOD LEFT ARM  Result Value Ref Range Status   Specimen Description BLOOD LEFT ARM  Final   Special Requests   Final    Blood Culture results may not be optimal due to an inadequate volume of blood received in culture bottles BOTTLES DRAWN AEROBIC AND ANAEROBIC   Culture   Final    NO GROWTH 2 DAYS Performed at [facility], [address] [place], [place] [address]    Report Status PENDING  Incomplete    Labs: CBC: Recent Labs  Lab 05/30/24 1843 05/31/24 0648 06/01/24 0456  WBC 20.1* 14.8* 9.9  NEUTROABS 15.5* 10.1*  --   HGB 12.1 11.6* 10.3*  HCT 36.9 36.2 31.0*  MCV 98.9 97.1 97.2  PLT 219 205 166   Basic Metabolic Panel: Recent Labs  Lab 05/30/24 1843 05/31/24 0648 06/01/24 0456  NA 136 134* 135  K 4.1 3.9 3.8  CL 99 98 100  CO2 26 26 26   GLUCOSE 109* 94 73  BUN 28* 17 13  CREATININE 0.80 0.63 0.68  CALCIUM  9.2 8.9 8.6*  MG  --  2.2  --    Liver Function Tests: Recent Labs  Lab 05/30/24 2032 05/31/24 0648  AST 32 29  ALT 18 14  ALKPHOS 76 61  BILITOT <0.2 0.4  PROT 6.7 6.1*  ALBUMIN 3.5 3.1*   CBG: No results for input(s): GLUCAP in the last 168  hours.  Discharge time spent: greater than 30 minutes.  Signed:  Owen DELENA Lore, MD [facility] 06/02/2024

## 2024-06-02 NOTE — TOC Transition Note (Addendum)
 Transition of Care [facility]) - Discharge Note   Patient Details  Name: Anna Mccoy MRN: [id number] Date of Birth: Jun 22, 1925  [facility] CM/SW Contact:  Doneta Glenys DASEN, RN Phone Number: 06/02/2024, 11:30 AM   Clinical Narrative:    [facility] PT/OT.  CM requested MD to DNR form for transport via [facility]. 12:10 PM DNR signed. CM placed discharge packet (face sheet, medical necessity, and signed DNR form) on shadow chart. [facility] has been called for transport.   Final next level of care: Home w Home Health Services Barriers to Discharge: Barriers Resolved   Patient Goals and CMS Choice Patient states their goals for this hospitalization and ongoing recovery are:: Home CMS Medicare.gov Compare Post Acute Care list provided to:: Patient Represenative (must comment) [name]) Choice offered to / list presented to : Adult Children Goff ownership interest in [facility]provided to:: Parent NA    Discharge Placement                       Discharge Plan and Services Additional resources added to the After Visit Summary for   In-house Referral: NA Discharge Planning Services: CM Consult Post Acute Care Choice: Home Health          DME Arranged: N/A DME Agency: NA       HH Arranged: PT HH Agency: [facility] Date HH Agency Contacted: 06/02/24      Social Drivers of Health (SDOH) Interventions SDOH Screenings   Food Insecurity: No Food Insecurity (05/31/2024)  Housing: Low Risk  (05/31/2024)  Transportation Needs: No Transportation Needs (05/31/2024)  Utilities: Not At Risk (05/31/2024)  Financial Resource Strain: Low Risk  (03/08/2020)   Received from [facility] visits prior to 10/14/2022.  Physical Activity: Insufficiently Active (03/08/2020)   Received from [facility] visits prior to 10/14/2022.  Social Connections: Moderately Isolated (05/31/2024)   Stress: No Stress Concern Present (03/08/2020)   Received from [facility] visits prior to 10/14/2022.  Tobacco Use: Low Risk  (05/30/2024)     Readmission Risk Interventions    06/02/2024   10:51 AM 03/16/2024   11:20 AM  Readmission Risk Prevention Plan  Transportation Screening Complete Complete  PCP or Specialist Appt within 5-7 Days  Complete  PCP or Specialist Appt within 3-5 Days Complete   Home Care Screening  Complete  Medication Review (RN CM)  Complete  HRI or Home Care Consult Complete   Social Work Consult for Recovery Care Planning/Counseling Complete   Palliative Care Screening Not Applicable   Medication Review Oceanographer) Complete

## 2024-06-02 NOTE — Progress Notes (Signed)
 OT Cancellation Note  Patient Details Name: Anna Mccoy MRN: [id number] DOB: 08-May-1925   Cancelled Treatment:    Reason Eval/Treat Not Completed: Other (comment) (Pt set up with HHOT. FAmily able to assist at DC. Will defer any OT needs to [facility]. Discussed with CM.)  Lenus Trauger,HILLARY 06/02/2024, 12:23 PM. Kreg Sink, OT/L   Acute OT Clinical Specialist Acute Rehabilitation Services Pager [phone] Office [phone]

## 2024-06-03 ENCOUNTER — Emergency Department (HOSPITAL_COMMUNITY)

## 2024-06-03 LAB — CBC WITH DIFFERENTIAL/PLATELET
Abs Immature Granulocytes: 0.04 K/uL (ref 0.00–0.07)
Basophils Absolute: 0 K/uL (ref 0.0–0.1)
Basophils Relative: 0 %
Eosinophils Absolute: 0.4 K/uL (ref 0.0–0.5)
Eosinophils Relative: 4 %
HCT: 37.9 % (ref 36.0–46.0)
Hemoglobin: 12.4 g/dL (ref 12.0–15.0)
Immature Granulocytes: 0 %
Lymphocytes Relative: 20 %
Lymphs Abs: 2.2 K/uL (ref 0.7–4.0)
MCH: 32.3 pg (ref 26.0–34.0)
MCHC: 32.7 g/dL (ref 30.0–36.0)
MCV: 98.7 fL (ref 80.0–100.0)
Monocytes Absolute: 1.3 K/uL — ABNORMAL HIGH (ref 0.1–1.0)
Monocytes Relative: 11 %
Neutro Abs: 7.2 K/uL (ref 1.7–7.7)
Neutrophils Relative %: 65 %
Platelets: 197 K/uL (ref 150–400)
RBC: 3.84 MIL/uL — ABNORMAL LOW (ref 3.87–5.11)
RDW: 14.2 % (ref 11.5–15.5)
WBC: 11.1 K/uL — ABNORMAL HIGH (ref 4.0–10.5)
nRBC: 0 % (ref 0.0–0.2)

## 2024-06-03 LAB — URINALYSIS, ROUTINE W REFLEX MICROSCOPIC
Bacteria, UA: NONE SEEN
Bilirubin Urine: NEGATIVE
Glucose, UA: NEGATIVE mg/dL
Hgb urine dipstick: NEGATIVE
Ketones, ur: NEGATIVE mg/dL
Nitrite: NEGATIVE
Protein, ur: 30 mg/dL — AB
Specific Gravity, Urine: 1.01 (ref 1.005–1.030)
pH: 6 (ref 5.0–8.0)

## 2024-06-03 LAB — TROPONIN T, HIGH SENSITIVITY
Troponin T High Sensitivity: 27 ng/L — ABNORMAL HIGH (ref 0–19)
Troponin T High Sensitivity: 25 ng/L — ABNORMAL HIGH (ref 0–19)

## 2024-06-03 LAB — COMPREHENSIVE METABOLIC PANEL WITH GFR
ALT: 24 U/L (ref 0–44)
AST: 49 U/L — ABNORMAL HIGH (ref 15–41)
Albumin: 3.6 g/dL (ref 3.5–5.0)
Alkaline Phosphatase: 71 U/L (ref 38–126)
Anion gap: 10 (ref 5–15)
BUN: 11 mg/dL (ref 8–23)
CO2: 28 mmol/L (ref 22–32)
Calcium: 9.6 mg/dL (ref 8.9–10.3)
Chloride: 98 mmol/L (ref 98–111)
Creatinine, Ser: 0.8 mg/dL (ref 0.44–1.00)
GFR, Estimated: >60 mL/min (ref >60)
Glucose, Bld: 83 mg/dL (ref 70–99)
Potassium: 4 mmol/L (ref 3.5–5.1)
Sodium: 136 mmol/L (ref 135–145)
Total Bilirubin: 0.3 mg/dL (ref 0.0–1.2)
Total Protein: 7.6 g/dL (ref 6.5–8.1)

## 2024-06-03 LAB — VALPROIC ACID LEVEL: Valproic Acid Lvl: 43 ug/mL — ABNORMAL LOW (ref 50–100)

## 2024-06-03 MED ORDER — ACETAMINOPHEN 325 MG PO TABS
650.0000 mg | ORAL_TABLET | Freq: Once | ORAL | Status: AC
  Administered 2024-06-03: 650 mg via ORAL
  Filled 2024-06-03: qty 2

## 2024-06-03 MED ORDER — AMOXICILLIN-POT CLAVULANATE 875-125 MG PO TABS: 1.0000 | ORAL_TABLET | Freq: Two times a day (BID) | ORAL | 0 refills | Status: AC

## 2024-06-03 MED ORDER — AMOXICILLIN-POT CLAVULANATE 875-125 MG PO TABS
1.0000 | ORAL_TABLET | Freq: Once | ORAL | Status: AC
  Administered 2024-06-03: 1 via ORAL
  Filled 2024-06-03: qty 1

## 2024-06-03 NOTE — Discharge Instructions (Signed)
 Keep a record of your blood pressure and follow-up with your doctor for blood pressure medication adjustments.  We are adding another antibiotic to the 1 you are already taking.  For the possibility of some fluid behind the right ear.  You should follow-up with your primary doctor as well as the ear nose and throat doctor.  Return to the ED with severe headache, chest pain, shortness of breath, difficulty breathing, difficulty swallowing or any concerns.

## 2024-06-03 NOTE — ED Notes (Signed)
 This RN able to arranged transport with [facility] for transport for the pt.

## 2024-06-04 LAB — CULTURE, BLOOD (ROUTINE X 2): Culture: NO GROWTH

## 2024-06-05 LAB — CULTURE, BLOOD (ROUTINE X 2): Culture: NO GROWTH

## 2024-06-08 ENCOUNTER — Other Ambulatory Visit: Payer: Self-pay

## 2024-06-08 ENCOUNTER — Emergency Department (HOSPITAL_COMMUNITY)
Admission: EM | Admit: 2024-06-08 | Discharge: 2024-06-08 | Disposition: A | Discharge status: Home / Self Care | Attending: Emergency Medicine | Admitting: Emergency Medicine

## 2024-06-08 ENCOUNTER — Encounter (HOSPITAL_COMMUNITY): Payer: Self-pay

## 2024-06-08 DIAGNOSIS — Z7982 Long term (current) use of aspirin: Secondary | ICD-10-CM | POA: Insufficient documentation

## 2024-06-08 DIAGNOSIS — K625 Hemorrhage of anus and rectum: Secondary | ICD-10-CM | POA: Diagnosis present

## 2024-06-08 DIAGNOSIS — Z79899 Other long term (current) drug therapy: Secondary | ICD-10-CM | POA: Insufficient documentation

## 2024-06-08 DIAGNOSIS — K642 Third degree hemorrhoids: Secondary | ICD-10-CM | POA: Insufficient documentation

## 2024-06-08 LAB — CBC WITH DIFFERENTIAL/PLATELET
Abs Immature Granulocytes: 0.02 K/uL (ref 0.00–0.07)
Basophils Absolute: 0 K/uL (ref 0.0–0.1)
Basophils Relative: 0 %
Eosinophils Absolute: 0.2 K/uL (ref 0.0–0.5)
Eosinophils Relative: 3 %
HCT: 40.6 % (ref 36.0–46.0)
Hemoglobin: 12.8 g/dL (ref 12.0–15.0)
Immature Granulocytes: 0 %
Lymphocytes Relative: 31 %
Lymphs Abs: 2.3 K/uL (ref 0.7–4.0)
MCH: 31.1 pg (ref 26.0–34.0)
MCHC: 31.5 g/dL (ref 30.0–36.0)
MCV: 98.8 fL (ref 80.0–100.0)
Monocytes Absolute: 0.7 K/uL (ref 0.1–1.0)
Monocytes Relative: 10 %
Neutro Abs: 4.3 K/uL (ref 1.7–7.7)
Neutrophils Relative %: 56 %
Platelets: 232 K/uL (ref 150–400)
RBC: 4.11 MIL/uL (ref 3.87–5.11)
RDW: 14.4 % (ref 11.5–15.5)
WBC: 7.6 K/uL (ref 4.0–10.5)
nRBC: 0 % (ref 0.0–0.2)

## 2024-06-08 LAB — COMPREHENSIVE METABOLIC PANEL WITH GFR
ALT: 24 U/L (ref 0–44)
AST: 37 U/L (ref 15–41)
Albumin: 3.3 g/dL — ABNORMAL LOW (ref 3.5–5.0)
Alkaline Phosphatase: 62 U/L (ref 38–126)
Anion gap: 8 (ref 5–15)
BUN: 6 mg/dL — ABNORMAL LOW (ref 8–23)
CO2: 28 mmol/L (ref 22–32)
Calcium: 8.8 mg/dL — ABNORMAL LOW (ref 8.9–10.3)
Chloride: 103 mmol/L (ref 98–111)
Creatinine, Ser: 0.6 mg/dL (ref 0.44–1.00)
GFR, Estimated: >60 mL/min (ref >60)
Glucose, Bld: 89 mg/dL (ref 70–99)
Potassium: 3.5 mmol/L (ref 3.5–5.1)
Sodium: 139 mmol/L (ref 135–145)
Total Bilirubin: 0.3 mg/dL (ref 0.0–1.2)
Total Protein: 6.5 g/dL (ref 6.5–8.1)

## 2024-06-08 MED ORDER — HYDROCORTISONE ACETATE 25 MG RE SUPP: 25.0000 mg | Freq: Two times a day (BID) | RECTAL | 0 refills | Status: AC

## 2024-06-08 MED ORDER — WITCH HAZEL-GLYCERIN EX PADS: 1.0000 | MEDICATED_PAD | CUTANEOUS | 12 refills | Status: AC | PRN

## 2024-06-08 MED ORDER — CARVEDILOL 3.125 MG PO TABS
6.2500 mg | ORAL_TABLET | Freq: Two times a day (BID) | ORAL | Status: DC
  Administered 2024-06-08: 6.25 mg via ORAL
  Filled 2024-06-08: qty 2

## 2024-06-08 MED ORDER — HYDROCORTISONE ACETATE 25 MG RE SUPP
25.0000 mg | Freq: Once | RECTAL | Status: AC
Start: 2024-06-08 — End: 2024-06-08
  Administered 2024-06-08: 25 mg via RECTAL
  Filled 2024-06-08: qty 1

## 2024-06-08 MED ORDER — IRBESARTAN 300 MG PO TABS
300.0000 mg | ORAL_TABLET | Freq: Every day | ORAL | Status: DC
  Administered 2024-06-08: 300 mg via ORAL
  Filled 2024-06-08: qty 1

## 2024-06-08 NOTE — Discharge Instructions (Addendum)
 Please be sure to follow-up with your physician via telephone tomorrow, discuss today's evaluation and to schedule follow-up. You have started medications for hemorrhoid relief, and continue these with your prescriptions provided today.  Return here for concerning changes in your condition.

## 2024-06-08 NOTE — ED Provider Notes (Signed)
 [place] EMERGENCY DEPARTMENT AT [facility] Provider Note   CSN: [id number] Arrival date & time: 06/08/24  1256     Patient presents with: No chief complaint on file.   Anna Mccoy is a 89 y.o. female.   HPI Elderly female presents with her daughter who assists with the history.  Patient presents with concern of bright red blood per rectum. History is notable for admission last week, discharged 5 days ago.  On discharge patient was taking ciprofloxacin, on 1 repeat ED visit she was also started on amoxicillin.  She notes that she has had frequent bowel movements since that time.  No abdominal pain, no fever, she does feel overall fatigued. She notes that today after production of a small amount of stool she noticed blood on the tissue paper.  With multiple attempts at wiping with tissue paper she continued of bright red blood per rectum.  She notes history of hemorrhoids, is unsure of the severity.    Prior to Admission medications   Medication Sig Start Date End Date Taking? Authorizing Provider  hydrocortisone  (ANUSOL -HC) 25 MG suppository Place 1 suppository (25 mg total) rectally 2 (two) times daily. 06/08/24  Yes Garrick Charleston, MD  witch hazel-glycerin (TUCKS) pad Apply 1 Application topically as needed for itching. 06/08/24  Yes Garrick Charleston, MD  Abatacept  (ORENCIA  [place]) Inject 1 Syringe into the skin every 30 (thirty) days.    [provider]  acetaminophen  (TYLENOL ) 500 MG tablet Take 500 mg by mouth at bedtime.    [provider]  amLODipine  (NORVASC ) 5 MG tablet Take 5 mg by mouth at bedtime.    [provider]  amoxicillin-clavulanate (AUGMENTIN) 875-125 MG tablet Take 1 tablet by mouth every 12 (twelve) hours. 06/03/24   Rancour, Garnette, MD  aspirin  81 MG tablet Take 81 mg by mouth daily.     [provider]  atorvastatin  (LIPITOR) 20 MG tablet Take 20 mg by mouth every evening.     [provider]   carvedilol  (COREG ) 6.25 MG tablet Take 6.25 mg by mouth 2 (two) times daily. 11/18/21   [provider]  Cholecalciferol (VITAMIN D ) 50 MCG (2000 UT) tablet Take 2,000 Units by mouth daily.    [provider]  ciprofloxacin (CIPRO) 500 MG tablet Take 1 tablet (500 mg total) by mouth daily with breakfast for 6 days. 06/03/24 06/09/24  Regalado, Belkys A, MD  Dextran 70-Hypromellose 0.1-0.3 % SOLN Apply 1 drop to eye daily at 6 (six) AM. 09/10/18   [provider]  divalproex  (DEPAKOTE ) 250 MG DR tablet Take 1 tablet (250 mg total) by mouth every 12 (twelve) hours. 02/05/15   Samtani, Jai-Gurmukh, MD  Docusate Sodium  (COLACE PO) Take 2 tablets by mouth at bedtime.    [provider]  famotidine  (PEPCID ) 20 MG tablet Take 20 mg by mouth daily. 09/25/23   [provider]  fluticasone (FLONASE) 50 MCG/ACT nasal spray Place 1 spray into the nose. 01/16/24   [provider]  folic acid  (FOLVITE ) 1 MG tablet Take 1 mg by mouth daily.    [provider]  furosemide  (LASIX ) 20 MG tablet Take 20 mg by mouth every Monday, Wednesday, and Friday. 10/20/16   [provider]  hypromellose (GENTEAL) 0.3 % GEL ophthalmic ointment Place 1 application  into both eyes See admin instructions. Gel - Every day except the days using Tobradex Patient not taking: Reported on 05/31/2024    [provider]  hypromellose (GENTEAL)  0.3 % GEL ophthalmic ointment Place 1 Application into both eyes at bedtime.    [provider]  levothyroxine  (SYNTHROID ) 75 MCG tablet Take 75 mcg by mouth daily before breakfast.    [provider]  Multiple Vitamins-Minerals (PRESERVISION AREDS PO) Take 1 capsule by mouth 2 (two) times daily.    [provider]  olmesartan  (BENICAR ) 40 MG tablet Take 40 mg by mouth daily. 11/18/21   [provider]  POLY-IRON  150 150 MG capsule Take 150 mg by mouth daily.  10/13/15   [provider]   primidone  (MYSOLINE ) 50 MG tablet Take 50 mg by mouth daily. 09/20/16   [provider]  Probiotic Product (PROBIOTIC PO) Take 1 capsule by mouth daily.    [provider]  TOBRADEX ophthalmic ointment Place 1 Application into both eyes See admin instructions. Once every 3 days PRN 11/22/21   [provider]    Allergies: Ace inhibitors, Bacitracin-polymyxin b, Ciprofloxacin, Lidocaine, Neosporin [neomycin-bacitracin zn-polymyx], and Sulfa antibiotics    Review of Systems  Updated Vital Signs BP (!) 149/77 (BP Location: Right Arm)   Pulse 63   Temp 97.8 F (36.6 C) (Oral)   Resp (!) 25   LMP 08/14/1981 (Approximate)   SpO2 100%   Physical Exam Vitals and nursing note reviewed.  Constitutional:      General: She is not in acute distress.    Appearance: She is well-developed.  HENT:     Head: Normocephalic and atraumatic.  Eyes:     Conjunctiva/sclera: Conjunctivae normal.  Cardiovascular:     Rate and Rhythm: Normal rate and regular rhythm.     Pulses: Normal pulses.  Pulmonary:     Effort: Pulmonary effort is normal. No respiratory distress.     Breath sounds: No stridor.  Abdominal:     General: There is no distension.     Palpations: There is no mass.     Tenderness: There is no abdominal tenderness. There is no guarding or rebound.  Skin:    General: Skin is warm and dry.  Neurological:     Mental Status: She is alert and oriented to person, place, and time.     Cranial Nerves: No cranial nerve deficit.  Psychiatric:        Mood and Affect: Mood normal.     (all labs ordered are listed, but only abnormal results are displayed) Labs Reviewed  COMPREHENSIVE METABOLIC PANEL WITH GFR - Abnormal; Notable for the following components:      Result Value   BUN 6 (*)    Calcium  8.8 (*)    Albumin 3.3 (*)    All other components within normal limits  CBC WITH DIFFERENTIAL/PLATELET    EKG: None  Radiology: No results  found.   Procedures   Medications Ordered in the ED  carvedilol  (COREG ) tablet 6.25 mg (6.25 mg Oral Given 06/08/24 1424)  irbesartan  (AVAPRO ) tablet 300 mg (300 mg Oral Given 06/08/24 1424)  hydrocortisone  (ANUSOL -HC) suppository 25 mg (25 mg Rectal Given 06/08/24 1424)                                    Medical Decision Making Elderly female presents less than 1 week after discharge following admission for sepsis, now with concern for bright red blood per rectum/GI bleed.  Patient's history of hemorrhoids is suggestive given her increased bowel movements, frequent straining to produce a stool,  but with recent history, broad differential including diverticulitis, bacteremia, sepsis, urinary tract infection, all considered. Pulse ox 100% room air  Amount and/or Complexity of Data Reviewed Independent Historian: caregiver External Data Reviewed: notes.    Details: Hi hospitalization notes reviewed Labs: ordered. Decision-making details documented in ED Course. Radiology: independent interpretation performed. Decision-making details documented in ED Course.  Risk Prescription drug management.   Update: On repeat exam patient is calm, in no distress, resting comfortably. I discussed all finds with the patient's daughter and the patient. No change in hemoglobin, and she has been monitored for hours without decompensation, hypotension, reassuring for suspicion of hemorrhoid associated bleeding in the context of recent antibiotic courses. Electrolytes unremarkable now as well. With unremarkable vital signs, I discussed this presentation with patient's daughter, patient will stop current antibiotics, will begin regimen for rectal bleeding, will follow-up with primary care, is stable for discharge.     Final diagnoses:  Rectal bleeding  Grade III hemorrhoids    ED Discharge Orders          Ordered    hydrocortisone  (ANUSOL -HC) 25 MG suppository  2 times daily        06/08/24  1853    witch hazel-glycerin (TUCKS) pad  As needed        06/08/24 1853               Garrick Charleston, MD 06/08/24 [phone]

## 2024-06-08 NOTE — ED Notes (Signed)
 Ptar set  up for pt

## 2024-06-08 NOTE — ED Triage Notes (Signed)
 Patient arrived from home via EMS after noticing blood on toilet paper after a bowel movement. Patient is unsure if there was blood in her stool before she flushed it. Patient denies pain.

## 2024-07-28 ENCOUNTER — Ambulatory Visit: Admitting: Podiatry
